# Patient Record
Sex: Female | Born: 1940 | Race: White | Hispanic: No | Smoking: Former smoker
Health system: Southern US, Community
[De-identification: ages and names within clinical notes are randomized; demographics above are authoritative.]

## PROBLEM LIST (undated history)

## (undated) DIAGNOSIS — R519 Headache, unspecified: Secondary | ICD-10-CM

## (undated) DIAGNOSIS — R011 Cardiac murmur, unspecified: Secondary | ICD-10-CM

## (undated) DIAGNOSIS — K219 Gastro-esophageal reflux disease without esophagitis: Secondary | ICD-10-CM

## (undated) DIAGNOSIS — E785 Hyperlipidemia, unspecified: Secondary | ICD-10-CM

## (undated) DIAGNOSIS — I499 Cardiac arrhythmia, unspecified: Secondary | ICD-10-CM

## (undated) DIAGNOSIS — J189 Pneumonia, unspecified organism: Secondary | ICD-10-CM

## (undated) DIAGNOSIS — F319 Bipolar disorder, unspecified: Secondary | ICD-10-CM

## (undated) DIAGNOSIS — C801 Malignant (primary) neoplasm, unspecified: Secondary | ICD-10-CM

## (undated) DIAGNOSIS — M1990 Unspecified osteoarthritis, unspecified site: Secondary | ICD-10-CM

## (undated) DIAGNOSIS — F32A Depression, unspecified: Secondary | ICD-10-CM

## (undated) DIAGNOSIS — F250 Schizoaffective disorder, bipolar type: Secondary | ICD-10-CM

## (undated) DIAGNOSIS — R112 Nausea with vomiting, unspecified: Secondary | ICD-10-CM

## (undated) DIAGNOSIS — E039 Hypothyroidism, unspecified: Secondary | ICD-10-CM

## (undated) DIAGNOSIS — Z9889 Other specified postprocedural states: Secondary | ICD-10-CM

## (undated) DIAGNOSIS — F259 Schizoaffective disorder, unspecified: Secondary | ICD-10-CM

## (undated) DIAGNOSIS — R0600 Dyspnea, unspecified: Secondary | ICD-10-CM

## (undated) DIAGNOSIS — F419 Anxiety disorder, unspecified: Secondary | ICD-10-CM

## (undated) DIAGNOSIS — E079 Disorder of thyroid, unspecified: Secondary | ICD-10-CM

## (undated) DIAGNOSIS — I10 Essential (primary) hypertension: Secondary | ICD-10-CM

## (undated) HISTORY — PX: HAND SURGERY: SHX662

## (undated) HISTORY — PX: BREAST SURGERY: SHX581

## (undated) HISTORY — DX: Disorder of thyroid, unspecified: E07.9

## (undated) HISTORY — DX: Unspecified osteoarthritis, unspecified site: M19.90

## (undated) HISTORY — DX: Cardiac arrhythmia, unspecified: I49.9

## (undated) HISTORY — PX: KNEE SURGERY: SHX244

## (undated) HISTORY — DX: Essential (primary) hypertension: I10

## (undated) HISTORY — DX: Hyperlipidemia, unspecified: E78.5

## (undated) HISTORY — PX: CHOLECYSTECTOMY: SHX55

## (undated) HISTORY — DX: Morbid (severe) obesity due to excess calories: E66.01

## (undated) HISTORY — DX: Schizoaffective disorder, bipolar type: F25.0

## (undated) HISTORY — DX: Bipolar disorder, unspecified: F31.9

## (undated) HISTORY — PX: TONSILLECTOMY: SUR1361

## (undated) HISTORY — DX: Schizoaffective disorder, unspecified: F25.9

## (undated) HISTORY — PX: DILATION AND CURETTAGE OF UTERUS: SHX78

## (undated) HISTORY — DX: Anxiety disorder, unspecified: F41.9

---

## 1998-07-12 ENCOUNTER — Inpatient Hospital Stay (HOSPITAL_COMMUNITY): Admission: AD | Admit: 1998-07-12 | Discharge: 1998-07-21 | Payer: Self-pay | Admitting: *Deleted

## 1998-11-18 ENCOUNTER — Inpatient Hospital Stay (HOSPITAL_COMMUNITY): Admission: AD | Admit: 1998-11-18 | Discharge: 1998-12-01 | Payer: Self-pay | Admitting: *Deleted

## 1998-11-19 ENCOUNTER — Encounter: Payer: Self-pay | Admitting: *Deleted

## 1998-11-20 ENCOUNTER — Encounter: Payer: Self-pay | Admitting: Cardiology

## 1998-11-22 ENCOUNTER — Encounter: Payer: Self-pay | Admitting: Cardiology

## 1998-12-05 ENCOUNTER — Inpatient Hospital Stay (HOSPITAL_COMMUNITY): Admission: EM | Admit: 1998-12-05 | Discharge: 1998-12-15 | Payer: Self-pay | Admitting: *Deleted

## 2001-04-08 ENCOUNTER — Ambulatory Visit (HOSPITAL_COMMUNITY): Admission: RE | Admit: 2001-04-08 | Discharge: 2001-04-08 | Payer: Self-pay | Admitting: Gastroenterology

## 2001-07-08 ENCOUNTER — Inpatient Hospital Stay (HOSPITAL_COMMUNITY): Admission: EM | Admit: 2001-07-08 | Discharge: 2001-07-09 | Payer: Self-pay

## 2003-09-05 ENCOUNTER — Ambulatory Visit (HOSPITAL_COMMUNITY): Admission: RE | Admit: 2003-09-05 | Discharge: 2003-09-05 | Payer: Self-pay | Admitting: Gastroenterology

## 2008-10-11 ENCOUNTER — Encounter: Admission: RE | Admit: 2008-10-11 | Discharge: 2008-10-11 | Payer: Self-pay | Admitting: Cardiology

## 2009-07-05 ENCOUNTER — Encounter: Admission: RE | Admit: 2009-07-05 | Discharge: 2009-07-05 | Payer: Self-pay | Admitting: Cardiology

## 2009-10-15 ENCOUNTER — Encounter: Admission: RE | Admit: 2009-10-15 | Discharge: 2009-10-15 | Payer: Self-pay | Admitting: Surgery

## 2009-10-17 ENCOUNTER — Encounter (INDEPENDENT_AMBULATORY_CARE_PROVIDER_SITE_OTHER): Payer: Self-pay | Admitting: Surgery

## 2009-10-17 ENCOUNTER — Ambulatory Visit (HOSPITAL_BASED_OUTPATIENT_CLINIC_OR_DEPARTMENT_OTHER): Admission: RE | Admit: 2009-10-17 | Discharge: 2009-10-17 | Payer: Self-pay | Admitting: Surgery

## 2009-10-24 ENCOUNTER — Ambulatory Visit: Payer: Self-pay | Admitting: Oncology

## 2009-10-31 LAB — CBC WITH DIFFERENTIAL/PLATELET
BASO%: 0.2 % (ref 0.0–2.0)
Basophils Absolute: 0 10*3/uL (ref 0.0–0.1)
EOS%: 2.4 % (ref 0.0–7.0)
HGB: 13.4 g/dL (ref 11.6–15.9)
MCH: 31.3 pg (ref 25.1–34.0)
MCHC: 33.9 g/dL (ref 31.5–36.0)
MONO#: 0.5 10*3/uL (ref 0.1–0.9)
RDW: 14.2 % (ref 11.2–14.5)
WBC: 6.7 10*3/uL (ref 3.9–10.3)
lymph#: 2.8 10*3/uL (ref 0.9–3.3)

## 2009-10-31 LAB — COMPREHENSIVE METABOLIC PANEL
ALT: 29 U/L (ref 0–35)
AST: 26 U/L (ref 0–37)
Albumin: 4.5 g/dL (ref 3.5–5.2)
CO2: 25 mEq/L (ref 19–32)
Calcium: 10 mg/dL (ref 8.4–10.5)
Chloride: 106 mEq/L (ref 96–112)
Potassium: 4.2 mEq/L (ref 3.5–5.3)

## 2009-10-31 LAB — LACTATE DEHYDROGENASE: LDH: 204 U/L (ref 94–250)

## 2009-11-13 ENCOUNTER — Ambulatory Visit: Admission: RE | Admit: 2009-11-13 | Discharge: 2009-12-07 | Payer: Self-pay | Admitting: Radiation Oncology

## 2009-12-10 ENCOUNTER — Ambulatory Visit: Admission: RE | Admit: 2009-12-10 | Discharge: 2010-01-17 | Payer: Self-pay | Admitting: Radiation Oncology

## 2010-01-13 ENCOUNTER — Ambulatory Visit: Payer: Self-pay | Admitting: Oncology

## 2010-01-14 ENCOUNTER — Other Ambulatory Visit: Payer: Self-pay | Admitting: Oncology

## 2010-01-14 LAB — CBC WITH DIFFERENTIAL/PLATELET
Basophils Absolute: 0 10*3/uL (ref 0.0–0.1)
HCT: 38.1 % (ref 34.8–46.6)
HGB: 13.1 g/dL (ref 11.6–15.9)
MCH: 31.9 pg (ref 25.1–34.0)
MONO#: 0.5 10*3/uL (ref 0.1–0.9)
NEUT%: 54.5 % (ref 38.4–76.8)
WBC: 4.2 10*3/uL (ref 3.9–10.3)
lymph#: 1.2 10*3/uL (ref 0.9–3.3)

## 2010-04-02 ENCOUNTER — Ambulatory Visit: Payer: Self-pay | Admitting: Oncology

## 2010-04-22 LAB — COMPREHENSIVE METABOLIC PANEL
ALT: 32 U/L (ref 0–35)
Alkaline Phosphatase: 48 U/L (ref 39–117)
CO2: 24 mEq/L (ref 19–32)
Creatinine, Ser: 1.01 mg/dL (ref 0.40–1.20)
Total Bilirubin: 0.4 mg/dL (ref 0.3–1.2)

## 2010-04-22 LAB — CBC WITH DIFFERENTIAL/PLATELET
BASO%: 0.4 % (ref 0.0–2.0)
HCT: 37.8 % (ref 34.8–46.6)
LYMPH%: 35.2 % (ref 14.0–49.7)
MCH: 32.1 pg (ref 25.1–34.0)
MCHC: 34.9 g/dL (ref 31.5–36.0)
MCV: 91.8 fL (ref 79.5–101.0)
MONO#: 0.4 10*3/uL (ref 0.1–0.9)
MONO%: 8.7 % (ref 0.0–14.0)
NEUT%: 54.3 % (ref 38.4–76.8)
Platelets: 242 10*3/uL (ref 145–400)
WBC: 4.7 10*3/uL (ref 3.9–10.3)

## 2010-04-22 LAB — VITAMIN D 25 HYDROXY (VIT D DEFICIENCY, FRACTURES): Vit D, 25-Hydroxy: 33 ng/mL (ref 30–89)

## 2010-05-24 ENCOUNTER — Emergency Department (HOSPITAL_COMMUNITY): Admission: EM | Admit: 2010-05-24 | Discharge: 2010-05-24 | Payer: Self-pay | Admitting: Family Medicine

## 2010-06-18 ENCOUNTER — Ambulatory Visit: Payer: Self-pay | Admitting: Oncology

## 2010-07-17 ENCOUNTER — Encounter: Admission: RE | Admit: 2010-07-17 | Discharge: 2010-07-17 | Payer: Self-pay | Admitting: Neurology

## 2010-12-28 ENCOUNTER — Inpatient Hospital Stay (HOSPITAL_COMMUNITY)
Admission: EM | Admit: 2010-12-28 | Discharge: 2010-12-29 | Payer: Self-pay | Source: Home / Self Care | Attending: Internal Medicine | Admitting: Internal Medicine

## 2010-12-29 NOTE — H&P (Addendum)
[name], [name]                 ACCOUNT NO.:  [id number]  MEDICAL RECORD NO.:  [id number]          PATIENT TYPE:  EMS  LOCATION:  MAJO                         FACILITY:  [facility]  PHYSICIAN:  Bevelyn Buckles. Mei Suits, MDDATE OF BIRTH:  10/21/1941  DATE OF ADMISSION:  12/28/2010 DATE OF DISCHARGE:                             HISTORY & PHYSICAL   PRIMARY CARDIOLOGIST:  Cassell Clement, M.D.  REASON FOR ADMISSION:  Chest pain.  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  She has never had a cardiac catheterization.  She did have a stress test in 1999, which was normal.  She has had several admissions over the years for chest pain, which she is ruled out for myocardial infarction with serial cardiac markers.  She tells me that today she was going with a friend to the drug store when she developed pain in her left breast.  This persisted all day and so she talked to one of her friend and he brought her to the emergency room.  In the emergency room, she was treated with two doses of sublingual nitroglycerin which resulted in her chest pain getting worse. As the nitroglycerin wore off, she said she feels better.  She does have minimal residual pain on palpation of the left breast.  She denies any associated nausea, vomiting, diaphoresis, or dyspnea.  Her EKG and first set of cardiac markers are normal.  REVIEW OF SYSTEMS:  She has chronic back and joint pain due to arthritis.  She also has significant problems with anxiety and depression, as well as bipolar disorder.  Remainder review of systems, all systems negative except for HPI and problem list.  PROBLEM LIST: 1. History of recurrent chest pain.     a.     Myoview in 70 was normal. 2. Several admissions for chest pain, all ruled out for myocardial     infarction. 3. Bipolar  disorder with question of schizophrenia. 4. Anxiety. 5. Morbid obesity. 6. Hypertension. 7. Hyperlipidemia. 8. Osteoarthritis. 9. Right bundle branch block.  SOCIAL HISTORY:  She is divorced.  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  She has a remote history of tobacco use, but quit.  Does not drink any alcohol.  FAMILY HISTORY:  Her father is 50 years old and does not having coronary artery disease.  Mother also is alive, but no coronary artery disease.  Has a grandfather who had coronary artery disease.  CURRENT MEDICATIONS: 1. Xanax extended release 1 mg b.i.d. and 0.5 mg up to q.6 h. p.r.n. 2. Namenda 10 mg b.i.d. 3. Lamotrigine 300 mg a day. 4. Aricept 10 mg a day. 5. Metoprolol XL 25 a day. 6. Spironolactone 25 a day. 7. Zyprexa 30 mg at bedtime. 8. Rozerem 8 mg at bedtime for sleep. 9. Multivitamin and multimineral. 10.Fish oil. 11.Calcium. 12.Tylenol. 13.Aspirin 325 a day. 14.Niaspan ER 500 mg a day.  ALLERGIES:  ANTIDEPRESSANTS AND SULFA DRUGS, UNCLEAR EXACTLY WHICH ANTIDEPRESSANTS.  PHYSICAL EXAMINATION:  GENERAL:  She is  sitting up in bed, in no acute distress.  Respirations unlabored. VITAL SIGNS:  Blood pressure is 170/60, heart rate is 80.  She is sating 93% on room air. HEENT:  Normal except for rosacea. NECK:  Supple.  There is no JVD.  Carotids are 2+ bilaterally without any bruits.  There is no lymphadenopathy or thyromegaly. CARDIAC:  PMI is not palpable.  She is regular with soft systolic ejection murmur at the right sternal border. LUNGS:  Clear. ABDOMEN:  Morbidly obese, nontender, and nondistended.  Unable to appreciate any hepatosplenomegaly. EXTREMITIES:  Warm with no cyanosis, clubbing, or edema.  DP pulses are 2+ bilaterally. NEUROLOGIC:  Alert and oriented x3.  Cranial nerves grossly intact. Moves all four extremities without difficulty.  RADIOLOGICAL STUDIES:  EKG shows normal sinus rhythm with a right  bundle branch block, which is chronic.  There are no acute ST-T wave abnormalities.  LABORATORY DATA:  White count 7.2, hemoglobin is 13.7, and platelet count is 227,000.  Sodium 140, potassium 3.5, BUN 15, creatinine 0.91, and glucose 122.  Troponin of less than 0.05 on two sets of markers. INR is 0.97.  Chest x-ray shows cardiomegaly, no acute process.  ASSESSMENT: 1. Recurrent atypical chest pain. 2. Hypertension. 3. Hyperlipidemia. 4. Anxiety/depression. 5. Possible schizophrenia.  PLAN/DISCUSSION:  Her chest pain seems quite atypical.  Given her risk factors, we will keep her overnight for 23-hour observation and rule out myocardial infarction with serial cardiac markers.  If enzymes remain normal, we can discharge her home in the a.m. with plans for outpatient Myoview with Dr. Patty Sermons.     Bevelyn Buckles. Alayjah Boehringer, MD     DRB/MEDQ  D:  12/28/2010  T:  12/28/2010  Job:  [id number]  Electronically Signed by Arvilla Meres MD on 12/29/2010 07:13:41 PM

## 2010-12-31 LAB — CBC
Hemoglobin: 14.2 g/dL (ref 12.0–15.0)
MCH: 30.9 pg (ref 26.0–34.0)
MCHC: 33.6 g/dL (ref 30.0–36.0)
MCV: 92.1 fL (ref 78.0–100.0)
Platelets: 227 10*3/uL (ref 150–400)
Platelets: 228 10*3/uL (ref 150–400)
RBC: 4.6 MIL/uL (ref 3.87–5.11)
WBC: 5.9 10*3/uL (ref 4.0–10.5)

## 2010-12-31 LAB — BASIC METABOLIC PANEL
BUN: 15 mg/dL (ref 6–23)
CO2: 26 mEq/L (ref 19–32)
Calcium: 9.9 mg/dL (ref 8.4–10.5)
Chloride: 103 mEq/L (ref 96–112)
GFR calc Af Amer: >60 mL/min (ref >60)
GFR calc non Af Amer: >60 mL/min (ref >60)
GFR calc non Af Amer: >60 mL/min (ref >60)
Glucose, Bld: 122 mg/dL — ABNORMAL HIGH (ref 70–99)
Potassium: 3.9 mEq/L (ref 3.5–5.1)
Sodium: 140 mEq/L (ref 135–145)
Sodium: 142 mEq/L (ref 135–145)

## 2010-12-31 LAB — DIFFERENTIAL
Basophils Relative: 0 % (ref 0–1)
Eosinophils Absolute: 0.1 10*3/uL (ref 0.0–0.7)
Eosinophils Relative: 2 % (ref 0–5)
Lymphs Abs: 3.3 10*3/uL (ref 0.7–4.0)
Monocytes Absolute: 0.7 10*3/uL (ref 0.1–1.0)
Monocytes Relative: 10 % (ref 3–12)

## 2010-12-31 LAB — POCT CARDIAC MARKERS
CKMB, poc: 2 ng/mL (ref 1.0–8.0)
Myoglobin, poc: 73.1 ng/mL (ref 12–200)
Troponin i, poc: <0.05 ng/mL (ref 0.00–0.09)

## 2010-12-31 LAB — CK TOTAL AND CKMB (NOT AT ARMC)
CK, MB: 2.6 ng/mL (ref 0.3–4.0)
Relative Index: 2.4 (ref 0.0–2.5)

## 2010-12-31 LAB — LIPID PANEL
Cholesterol: 246 mg/dL — ABNORMAL HIGH (ref 0–200)
HDL: 52 mg/dL (ref >39)
LDL Cholesterol: UNDETERMINED mg/dL (ref 0–99)
Total CHOL/HDL Ratio: 4.7 RATIO
Triglycerides: 424 mg/dL — ABNORMAL HIGH (ref <150)

## 2010-12-31 LAB — CARDIAC PANEL(CRET KIN+CKTOT+MB+TROPI)
Relative Index: INVALID (ref 0.0–2.5)
Relative Index: INVALID (ref 0.0–2.5)
Total CK: 94 U/L (ref 7–177)
Total CK: 94 U/L (ref 7–177)

## 2010-12-31 LAB — HEPARIN LEVEL (UNFRACTIONATED): Heparin Unfractionated: <0.1 IU/mL — ABNORMAL LOW (ref 0.30–0.70)

## 2010-12-31 LAB — TROPONIN I: Troponin I: 0.03 ng/mL (ref 0.00–0.06)

## 2010-12-31 LAB — PROTIME-INR: Prothrombin Time: 13.1 seconds (ref 11.6–15.2)

## 2011-01-02 NOTE — Discharge Summary (Addendum)
[name], [name]                 ACCOUNT NO.:  [id number]  MEDICAL RECORD NO.:  [id number]          PATIENT TYPE:  INP  LOCATION:  3729                         FACILITY:  [facility]  PHYSICIAN:  Bevelyn Buckles. Bensimhon, MDDATE OF BIRTH:  [birth year]  DATE OF ADMISSION:  12/28/2010 DATE OF DISCHARGE:  12/29/2010                              DISCHARGE SUMMARY   PRIMARY CARDIOLOGIST:  Cassell Clement, MD  PROCEDURES PERFORMED DURING HOSPITALIZATION:  None.  FINAL DISCHARGE DIAGNOSES: 1. Chest pain.     a.     Noncardiac.     b.     History of recurrent chest pain.     c.     Status post Myoview in 1999 which was normal.     d.     Several admissions for chest pain, all ruled out for      myocardial infarction. 2. Bipolar disorder with question of schizophrenia. 3. Anxiety. 4. Morbid obesity. 5. Hypertension. 6. Hyperlipidemia. 7. Osteoarthritis. 8. Chronic right bundle-branch block.  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  Her friend brought her to the emergency room.  She was treated with two doses of sublingual nitroglycerin which resulted her chest pain becoming worse.  Once the nitroglycerin wore off, she began to feel better.  The patient was seen and examined in the emergency room by Dr. Arvilla Meres.  She was admitted to rule out myocardial infarction with cycling cardiac enzymes.  Cardiac enzymes were cycled and found to be normal.  There were no changes in her EKG indicative of ischemia.  The patient did have cholesterol studies completed revealing elevated cholesterol of 246 with elevated triglycerides of 424.  She was started on fenofibrate 145 mg daily on discharge.  No other medications were changed, and she was found to be stable for discharge on morning rounds by Dr.  Gala Romney.  She will follow up with Dr. Patty Roberts as an outpatient for continued management.  DISCHARGE LABORATORY DATA:  Cholesterol 246, triglycerides 424, HDL 52, and LDL was unable to be calculated.  Sodium 142, potassium 3.9, chloride 103, CO2 of 26, glucose 113, BUN 13, and creatinine 0.84. Hemoglobin 14.2, hematocrit 42.9, white blood cells 5.9, and platelets 228.  Troponin 0.03, 0.04, and 0.04 respectively.    Chest x-ray dated December 28, 2010, revealing cardiomegaly without              acute abnormalities.  EKG revealing sinus rhythm, right bundle-branch block with a rate of 78 beats per minute without acute T wave abnormalities.  VITAL SIGNS ON DISCHARGE:  Blood pressure 130/72, pulse 73, respirations 18, temperature 97.8, and O2 sat 95% on room air.  The patient's weight was 86.6 kg.  DISCHARGE MEDICATIONS: 1. Fenofibrate 145 mg 1 tablet by mouth daily (new prescription     provided). 2. Aricept 10 mg by mouth daily. 3. Aspirin 325 mg at bedtime. 4. Calcium over-the-counter by mouth daily.5. Fish oil over-the-counter  by mouth twice daily. 6. Lamotrigine 100 mg by mouth t.i.d. 7. Metoprolol tartrate 25 mg by mouth daily. 8. Multivitamin over-the-counter by mouth daily. 9. Namenda 10 mg by mouth b.i.d. 10.Niaspan 500 mg by mouth daily at bedtime. 11.Rosiglitazone 8 mg by mouth daily at bedtime. 12.Spirolactone 25 mg daily. 13.Extra Strength Tylenol 1 tablet by mouth 4 times a day. 14.Xanax 0.5 mg 1/2 tablet by mouth p.r.n. 15.Xanax ER 1 mg by mouth b.i.d. 16.Zyprexa 15 mg by mouth b.i.d.  ALLERGIES:  SULFA.  FOLLOWUP PLANS AND APPOINTMENT: 1. The patient will follow up with Dr. Patty Roberts as an outpatient for     continued management.  She may need to be considered for a stress     Myoview at his discretion.  Their office will call to make the     appointment as this is a weekend. 2. The patient has been advised on new medication for     hypertriglyceridemia  with new prescription provided. 3. The patient has been advised to bring all medications to followup     appointment.  Time spent with the patient to include physician time 30 minutes.     Bettey Mare. Lyman Bishop, NP   ______________________________ Bevelyn Buckles. Bensimhon, MD    KML/MEDQ  D:  12/29/2010  T:  12/29/2010  Job:  [id number]  cc:   Cassell Clement, M.D.  Electronically Signed by Joni Reining NP on 12/30/2010 04:45:37 PM Electronically Signed by Arvilla Meres MD on 01/02/2011 04:03:58 PM

## 2011-01-08 ENCOUNTER — Ambulatory Visit: Payer: Self-pay | Admitting: Cardiology

## 2011-01-08 ENCOUNTER — Other Ambulatory Visit: Payer: Medicaid Other

## 2011-01-08 DIAGNOSIS — Z79899 Other long term (current) drug therapy: Secondary | ICD-10-CM

## 2011-01-08 DIAGNOSIS — E039 Hypothyroidism, unspecified: Secondary | ICD-10-CM

## 2011-01-08 DIAGNOSIS — E7800 Pure hypercholesterolemia, unspecified: Secondary | ICD-10-CM

## 2011-02-23 LAB — HERPES SIMPLEX VIRUS CULTURE

## 2011-03-11 ENCOUNTER — Other Ambulatory Visit: Payer: Self-pay | Admitting: *Deleted

## 2011-03-11 DIAGNOSIS — E786 Lipoprotein deficiency: Secondary | ICD-10-CM

## 2011-03-11 MED ORDER — NIACIN ER (ANTIHYPERLIPIDEMIC) 500 MG PO TBCR: EXTENDED_RELEASE_TABLET | ORAL | Status: DC

## 2011-03-11 NOTE — Telephone Encounter (Signed)
Refilled meds per fax request.  

## 2011-03-12 LAB — DIFFERENTIAL
Basophils Relative: 0 % (ref 0–1)
Eosinophils Absolute: 0.1 10*3/uL (ref 0.0–0.7)
Monocytes Relative: 8 % (ref 3–12)
Neutrophils Relative %: 44 % (ref 43–77)

## 2011-03-12 LAB — COMPREHENSIVE METABOLIC PANEL
ALT: 29 U/L (ref 0–35)
Alkaline Phosphatase: 48 U/L (ref 39–117)
CO2: 29 mEq/L (ref 19–32)
GFR calc non Af Amer: >60 mL/min (ref >60)
Glucose, Bld: 113 mg/dL — ABNORMAL HIGH (ref 70–99)
Potassium: 4.1 mEq/L (ref 3.5–5.1)
Sodium: 140 mEq/L (ref 135–145)
Total Protein: 6.4 g/dL (ref 6.0–8.3)

## 2011-03-12 LAB — CBC
Hemoglobin: 13.2 g/dL (ref 12.0–15.0)
RBC: 4.15 MIL/uL (ref 3.87–5.11)

## 2011-03-25 ENCOUNTER — Other Ambulatory Visit: Payer: Self-pay | Admitting: Oncology

## 2011-03-25 ENCOUNTER — Encounter (HOSPITAL_BASED_OUTPATIENT_CLINIC_OR_DEPARTMENT_OTHER): Payer: 59 | Admitting: Oncology

## 2011-03-25 DIAGNOSIS — C50419 Malignant neoplasm of upper-outer quadrant of unspecified female breast: Secondary | ICD-10-CM

## 2011-03-25 DIAGNOSIS — Z170 Estrogen receptor positive status [ER+]: Secondary | ICD-10-CM

## 2011-03-25 LAB — CBC WITH DIFFERENTIAL/PLATELET
BASO%: 0.5 % (ref 0.0–2.0)
EOS%: 3.1 % (ref 0.0–7.0)
HCT: 35.9 % (ref 34.8–46.6)
MCH: 32.1 pg (ref 25.1–34.0)
MCHC: 34.8 g/dL (ref 31.5–36.0)
MONO#: 0.4 10*3/uL (ref 0.1–0.9)
NEUT%: 41.1 % (ref 38.4–76.8)
RDW: 13.6 % (ref 11.2–14.5)
WBC: 3.9 10*3/uL (ref 3.9–10.3)
lymph#: 1.8 10*3/uL (ref 0.9–3.3)

## 2011-03-25 LAB — COMPREHENSIVE METABOLIC PANEL
ALT: 30 U/L (ref 0–35)
AST: 31 U/L (ref 0–37)
Albumin: 4.5 g/dL (ref 3.5–5.2)
CO2: 23 mEq/L (ref 19–32)
Calcium: 9.9 mg/dL (ref 8.4–10.5)
Chloride: 106 mEq/L (ref 96–112)
Creatinine, Ser: 1.11 mg/dL (ref 0.40–1.20)
Potassium: 4 mEq/L (ref 3.5–5.3)
Sodium: 140 mEq/L (ref 135–145)
Total Protein: 6.7 g/dL (ref 6.0–8.3)

## 2011-04-08 ENCOUNTER — Other Ambulatory Visit: Payer: Self-pay | Admitting: *Deleted

## 2011-04-08 DIAGNOSIS — E7800 Pure hypercholesterolemia, unspecified: Secondary | ICD-10-CM

## 2011-04-08 DIAGNOSIS — Z79899 Other long term (current) drug therapy: Secondary | ICD-10-CM

## 2011-04-25 NOTE — Op Note (Signed)
   NAME:  [name], [name]                           ACCOUNT NO.:  [id number]   MEDICAL RECORD NO.:  [id number]                   PATIENT TYPE:  AMB   LOCATION:  ENDO                                 FACILITY:  [facility]   PHYSICIAN:  James L. Malon Kindle., M.D.          DATE OF BIRTH:  [birth year]   DATE OF PROCEDURE:  09/05/2003  DATE OF DISCHARGE:                                 OPERATIVE REPORT   PROCEDURE:  Esophagogastroduodenoscopy with biopsy.   MEDICATIONS:  Cetacaine spray, fentanyl 100 mcg, Versed 10 mg IV.   INDICATIONS FOR PROCEDURE:  Heme positive stool, vague left sided abdominal  pain.   DESCRIPTION OF PROCEDURE:  The procedure had been explained to the patient  and consent obtained. With the patient in the left lateral decubitus  position, the Olympus scope was inserted and advanced. We advanced down to  the stomach, the pyloric channel was identified and passed. The duodenum  including the bulb and the second portion was seen well. There was marked  duodenitis and several erosions. The scope was withdrawn back in the  stomach. There were several gastric erosions and gastritis. A biopsy was  taken for rapid urase test for Helicobacter. The fundus and cardia were seen  well in the retroflexed view and were normal. The scope was withdrawn and  the distal and proximal esophagus were endoscopically normal. The patient  tolerated the procedure well and was maintained on low flow oxygen and pulse  oximeter throughout the procedure.   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   PLAN:  Will give the patient Prilosec over the counter, check the results of  the CLOtest and see back in the office in six weeks.                                               James L. Malon Kindle., M.D.    Waldron Session  D:  09/05/2003  T:  09/05/2003  Job:  [id number]   cc:   Cassell Clement, M.D.  [address], [address]  [place]  [place] [address]  Fax: [phone]

## 2011-04-25 NOTE — Op Note (Signed)
   NAME:  [name], [name]                           ACCOUNT NO.:  [id number]   MEDICAL RECORD NO.:  [id number]                   PATIENT TYPE:  AMB   LOCATION:  ENDO                                 [facility]ACILITY:  [facility]   PHYSICIAN:  James L. Malon Kindle., M.D.          DATE OF BIRTH:  02/11/1941   DATE OF PROCEDURE:  09/05/2003  DATE OF DISCHARGE:                                 OPERATIVE REPORT   PROCEDURE:  Colonoscopy.   ENDOSCOPIST:  Llana Aliment. Edwards, M.D.   MEDICATIONS:  Fentanyl 137.5 mcg, Versed 13 mg IV.   SCOPE:  Pediatric Olympus colonoscope.   INDICATIONS FOR PROCEDURE:  Heme-positive stool.  The patient had an  endoscopy just before this procedure, showing marked duodenitis and  gastritis.   DESCRIPTION OF PROCEDURE:  The procedure had been explained to the patient  and a consent obtained.  With the patient in the left lateral decubitus  position, the Olympus scope was inserted and advanced.  We were able to  advance easily to the cecum.  The ileocecal valve and appendiceal orifice  were seen.  The scope was withdrawn and the cecum, ascending colon, hepatic  flexure, transverse colon, splenic flexure, descending and sigmoid colon  were seen well.  No significant diverticular disease.  No polyps or other  lesions.  The scope was withdrawn.  The patient tolerated the procedure well.   ASSESSMENT:  Heme-positive stool with essentially normal colonoscopy.   PLAN:  Will treat the patient's duodenitis and gastritis, and see back in  the office in six weeks.                                                James L. Malon Kindle., M.D.    Waldron Session  D:  09/05/2003  T:  09/05/2003  Job:  [id number]   cc:   Cassell Clement, M.D.  [address], [address]  [place]  [place] [address]  Fax: [phone]

## 2011-04-25 NOTE — Procedures (Signed)
[place]  Patient:    [name], [name]                        MRN: [id number] Proc. Date: 04/08/01 Adm. Date:  [id number] Attending:  Orland Mustard CC:         Clovis Pu Patty Sermons, M.D.   Procedure Report  PROCEDURE PERFORMED:  Colonoscopy.  ENDOSCOPIST:  Llana Aliment. Randa Evens, M.D.  MEDICATIONS USED:  Fentanyl 100 mcg, Versed 10 mg IV.  INSTRUMENT:  Pediatric video colonoscope.  INDICATIONS:  Rectal bleeding in a 70 year old woman.  DESCRIPTION OF PROCEDURE:  The procedure had been explained to the patient and consent obtained.  With the patient in the left lateral decubitus position, the Olympus pediatric video colonoscope was inserted and advanced under direct visualization.  The prep was excellent and we were able to advance to the cecum without difficulty.  The ileocecal valve and appendiceal orifice were seen and the right lower quadrant transilluminated.  The scope was withdrawn. The cecum, ascending colon, hepatic flexure, transverse colon, splenic flexure, descending and sigmoid colon were seen well upon removal.  No polyps or other lesions seen.  Internal hemorrhoids seen in the rectum.  Scope withdrawn, patient tolerated the procedure well.  ASSESSMENT:  Hematochezia probably due to internal hemorrhoids.  PLAN:  I will give the patient hemorrhoid instructions and see back in the office in two months. DD:  04/08/01 TD:  04/09/01 Job: [id number]

## 2011-04-25 NOTE — H&P (Signed)
[place]  Patient:    [name], [name]                        MRN: [id number] Adm. Date:  [id number] Disc. Date: [id number] Attending:  Orland Mustard CC:         Eliezer Bottom, M.D.   History and Physical  CHIEF COMPLAINT: Chest pain.  HISTORY OF PRESENT ILLNESS:  This is a 70 year old Caucasian female admitted through the emergency room with chest pain.  She has no prior history of known coronary artery disease.  She was hospitalized on the psychiatric unit in 1999 and was noted to have mild cardiomegaly by x-ray at that time. She underwent an Adenosine Cardiolite stress test which was normal on November 22, 1998. She had a 2D echocardiogram on November 20, 1998 showing normal LV function, trace mitral regurgitation and trace tricuspid regurgitation. She has a past history of elevated lipids, particularly triglycerides. She does not have any history of known hypertension or diabetes.  She is hypothyroid.  She went to bed last evening, at about 6 p.m. which is her usual habit. She awoke at 1 a.m. with left sided chest pain. It was not sharp and not particularly dull or pressure like either. The pain did not radiate. There was some nausea but no diaphoresis.  She became concerned, called a friend who drove her to the emergency room, where she was evaluated and admitted.  PAST MEDICAL HISTORY:  Chronic schizophrenia on multiple medications through Dr. Evelene Croon.  FAMILY HISTORY:  Positive for coronary artery disease in a grandfather.  SOCIAL HISTORY:  Reveals that she is divorced.  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  She smoked cigarettes for 14 years but quit more than 20 years ago.  She does not drink any alcohol.  ALLERGIES:  She is allergic to SULFA DRUGS and ANTIDEPRESSANTS.  PRESENT MEDICATIONS:  ProSom 2 mg q.h.s.  Xanax 0.5 mg one t.i.d. and three at history.  Zyprexa 20 mg q.h.s. Tetracycline  500 mg daily for rosacea. Synthroid 0.025 one daily.  Vioxx 25 mg b.i.d. after meals. Depakote-ER 1500 mg q.h.s. Prempro one daily.  REVIEW OF SYSTEMS:  She has had a past history of constipation with occasional hematochezia and had a normal colonoscopy in May 2002 by Dr. Randa Evens. Genitourinary reveals urinary frequency. Endocrine reveals a history of mild hypothyroidism but no diabetes.  Cardiovascular history reveals that she is relatively sedentary and has been mildly overweight.  PHYSICAL EXAMINATION:  VITAL SIGNS:  Blood pressure 140/75, pulse 93, respirations normal.  HEENT:  Normal.  NECK:  Jugular venous pressure normal.  Carotids normal.  LUNGS:  Clear.  BREASTS:  Not examined.  HEART:  Reveals no murmur, gallop, rub, or click.  There is no chest wall tenderness.  ABDOMEN:  Soft without hepatosplenomegaly or masses.  PELVIC/RECTAL: Deferred.  EXTREMITIES: Show good peripheral pulses.  No phlebitis or edema.  Chest x-ray shows normal heart size and clear lungs.  EKG shows normal sinus rhythm with incomplete right bundle branch block and a septal T-wave inversion slightly increased from previous office tracing.  Cardiac enzymes are negative times one.  DIAGNOSTIC IMPRESSION: 1. Chest pain, rule out myocardial infarction, rule out angina pectoris,    rule out gastrointestinal etiology. 2. Schizophrenia. 3. Complicated hypothyroidism. 4. Hyperlipidemia. 5. Osteoarthritis. 6. Rosacea. 7. Post menopausal on Prempro.  DISPOSITION:  Telemetry unit.  Beta-blockers, aspirin, Lovenox.  Hold Prempro for now and she will discuss whether to remain on it with her gynecologist, Dr. Elana Alm.  Will get serial enzymes and EKG.  Consider outpatient treadmill Cardiolite depending on results of initial workup. DD:  07/08/01 TD:  07/08/01 Job: [id number]

## 2011-04-30 ENCOUNTER — Ambulatory Visit (INDEPENDENT_AMBULATORY_CARE_PROVIDER_SITE_OTHER): Payer: 59 | Admitting: Cardiology

## 2011-04-30 ENCOUNTER — Encounter: Payer: Self-pay | Admitting: *Deleted

## 2011-04-30 ENCOUNTER — Other Ambulatory Visit (INDEPENDENT_AMBULATORY_CARE_PROVIDER_SITE_OTHER): Payer: 59 | Admitting: *Deleted

## 2011-04-30 ENCOUNTER — Other Ambulatory Visit (INDEPENDENT_AMBULATORY_CARE_PROVIDER_SITE_OTHER): Payer: 59 | Admitting: Cardiology

## 2011-04-30 ENCOUNTER — Encounter: Payer: Self-pay | Admitting: Cardiology

## 2011-04-30 VITALS — BP 100/70 | HR 64 | Wt 191.0 lb

## 2011-04-30 DIAGNOSIS — E785 Hyperlipidemia, unspecified: Secondary | ICD-10-CM

## 2011-04-30 DIAGNOSIS — R110 Nausea: Secondary | ICD-10-CM

## 2011-04-30 DIAGNOSIS — I119 Hypertensive heart disease without heart failure: Secondary | ICD-10-CM | POA: Insufficient documentation

## 2011-04-30 DIAGNOSIS — Z79899 Other long term (current) drug therapy: Secondary | ICD-10-CM

## 2011-04-30 DIAGNOSIS — I10 Essential (primary) hypertension: Secondary | ICD-10-CM | POA: Insufficient documentation

## 2011-04-30 DIAGNOSIS — E7800 Pure hypercholesterolemia, unspecified: Secondary | ICD-10-CM

## 2011-04-30 DIAGNOSIS — Z13220 Encounter for screening for lipoid disorders: Secondary | ICD-10-CM

## 2011-04-30 DIAGNOSIS — F0390 Unspecified dementia without behavioral disturbance: Secondary | ICD-10-CM | POA: Insufficient documentation

## 2011-04-30 DIAGNOSIS — E039 Hypothyroidism, unspecified: Secondary | ICD-10-CM | POA: Insufficient documentation

## 2011-04-30 LAB — HEPATIC FUNCTION PANEL
ALT: 34 U/L (ref 0–35)
AST: 33 U/L (ref 0–37)
Alkaline Phosphatase: 35 U/L — ABNORMAL LOW (ref 39–117)
Bilirubin, Direct: 0.1 mg/dL (ref 0.0–0.3)
Total Bilirubin: 0.4 mg/dL (ref 0.3–1.2)
Total Protein: 6.8 g/dL (ref 6.0–8.3)

## 2011-04-30 LAB — T4, FREE: Free T4: 0.73 ng/dL (ref 0.60–1.60)

## 2011-04-30 LAB — LIPID PANEL: Total CHOL/HDL Ratio: 3

## 2011-04-30 LAB — BASIC METABOLIC PANEL
CO2: 26 mEq/L (ref 19–32)
Chloride: 107 mEq/L (ref 96–112)
Potassium: 4.3 mEq/L (ref 3.5–5.1)
Sodium: 140 mEq/L (ref 135–145)

## 2011-04-30 LAB — LDL CHOLESTEROL, DIRECT: Direct LDL: 123.3 mg/dL

## 2011-04-30 LAB — TSH: TSH: 2.21 u[IU]/mL (ref 0.35–5.50)

## 2011-04-30 MED ORDER — PROMETHAZINE HCL 25 MG PO TABS: 25.0000 mg | ORAL_TABLET | Freq: Four times a day (QID) | ORAL | Status: DC | PRN

## 2011-04-30 NOTE — Progress Notes (Signed)
[name] Date of Birth:  November 18, 1941 [facility] [address]   [address] [place], [place]  [address] [phone]           Fax   [phone]  HPI: This pleasant 70 year old woman is seen for a scheduled followup office visit.  She has a past history of hypercholesterolemia and exogenous obesity.  She's also had a history of essential hypertension.  She had a admission to [place] for chest pain on 12/28/10 and ruled out for myocardial infarction.  She had a Myoview stress test in 2008 which was normal.  She does have a chronic right bundle-branch block.  She's had a history of anxiety and depression as well as question of schizophrenia.  She had an echocardiogram on 04/24/08 which showed moderate LVH with normal systolic function and with impaired relaxation as well as trace mitral regurgitation and normal pulmonary artery pressure.Recently she was having problems with frequent loose stools but for the past week has been taking an over-the-counter preparation called flora and her bowel habits have normalized.  Current Outpatient Prescriptions  Medication Sig Dispense Refill  . acetaminophen (TYLENOL) 500 MG tablet Take 500 mg by mouth every 6 (six) hours as needed.        . ALPRAZolam (XANAX) 1 MG tablet Take 1 mg by mouth 2 (two) times daily.        Marland Kitchen aspirin 325 MG EC tablet Take 325 mg by mouth daily.        Marland Kitchen CORAL CALCIUM PO Take by mouth.        . fenofibrate (TRICOR) 145 MG tablet Take 145 mg by mouth daily.        . fish oil-omega-3 fatty acids 1000 MG capsule Take 2 g by mouth daily.        Marland Kitchen lamoTRIgine (LAMICTAL) 150 MG tablet Take 300 mg by mouth daily.        Marland Kitchen levothyroxine (SYNTHROID, LEVOTHROID) 150 MCG tablet Take 150 mcg by mouth daily.        . memantine (NAMENDA) 10 MG tablet Take 10 mg by mouth 2 (two) times daily.        . metoprolol succinate (TOPROL-XL) 25 MG 24 hr tablet Take 25 mg by mouth daily.        . Multiple Vitamin  (MULTIVITAMIN) tablet Take 1 tablet by mouth daily.        . niacin (NIASPAN) 500 MG CR tablet Take 1 tablet at bedtime with lowfat snack, 325 mg aspirin 30 minutes prior  30 tablet  11  . OLANZapine (ZYPREXA) 15 MG tablet Take 30 mg by mouth at bedtime.        . ramelteon (ROZEREM) 8 MG tablet Take 8 mg by mouth at bedtime.        Marland Kitchen spironolactone (ALDACTONE) 25 MG tablet Take 25 mg by mouth daily.        Marland Kitchen donepezil (ARICEPT) 10 MG tablet Take 10 mg by mouth at bedtime as needed.        . promethazine (PHENERGAN) 25 MG tablet Take 1 tablet (25 mg total) by mouth every 6 (six) hours as needed for nausea.  30 tablet  0    Allergies  Allergen Reactions  . Macrodantin   . Prozac (Fluoxetine Hcl)   . Sulfa Antibiotics   . Wellbutrin (Bupropion Hcl)   . Zoloft     Patient Active Problem List  Diagnoses  . Hypercholesterolemia  . Hypothyroidism  .  Dementia  . Benign hypertensive heart disease without heart failure    History  Smoking status  . Former Smoker  . Quit date: 12/08/1978  Smokeless tobacco  . Never Used    History  Alcohol Use No    Family History  Problem Relation Age of Onset  . Hypertension Mother     Review of Systems: The patient denies any heat or cold intolerance.  No weight gain or weight loss.  The patient denies headaches or blurry vision.  There is no cough or sputum production.  The patient denies dizziness.  There is no hematuria or hematochezia.  The patient denies any muscle aches or arthritis.  The patient denies any rash.  The patient denies frequent falling or instability.  There is no history of depression or anxiety.  All other systems were reviewed and are negative.   Physical Exam: Filed Vitals:   04/30/11 1014  BP: 100/70  Pulse: 64  The general appearance reveals a well-developed well-nourished woman in no distress.Pupils equal and reactive.   Extraocular Movements are full.  There is no scleral icterus.  The mouth and pharynx are  normal.  The neck is supple.  The carotids reveal no bruits.  The jugular venous pressure is normal.  The thyroid is not enlarged.  There is no lymphadenopathy.The chest is clear to percussion and auscultation. There are no rales or rhonchi. Expansion of the chest is symmetrical.The precordium is quiet.  The first heart sound is normal.  The second heart sound is physiologically split.  There is no murmur gallop rub or click.  There is no abnormal lift or heave.The abdomen is soft and nontender. Bowel sounds are normal. The liver and spleen are not enlarged. There Are no abdominal masses. There are no bruits.  Normal extremity without phlebitis or edema.  Pedal pulses are present.Strength is normal and symmetrical in all extremities.  There is no lateralizing weakness.  There are no sensory deficits.    Assessment / Plan: Continue same medication.  Her request we added Phenergan 25 mg tablet to have on hand for p.r.n. Use for nausea.  Her return in 4 months for followup office visit and fasting lab work including thyroid function lipid panel hepatic function panel and basal metabolic panel

## 2011-04-30 NOTE — Assessment & Plan Note (Signed)
The patient has a past history of dementia as well as problems with anxiety and depression.  She is followed by psychiatry.  Clinically she appears to be doing well from a psychiatric standpoint at this time.

## 2011-04-30 NOTE — Assessment & Plan Note (Signed)
The patient is on Synthroid 150 mcg daily.  She is clinically euthyroid.  We are checking blood work today.

## 2011-04-30 NOTE — Assessment & Plan Note (Signed)
The patient is seen for a scheduled followup office visit. She has a past history of elevated cholesterol.  She is working hard on her diet and she is avoiding desserts.  Her weight is down about 6 pounds since last visit.  She walks regularly at her assisted living facility.

## 2011-05-01 ENCOUNTER — Telehealth: Payer: Self-pay | Admitting: *Deleted

## 2011-05-01 NOTE — Telephone Encounter (Signed)
Adv pt . Of lab results 

## 2011-06-03 ENCOUNTER — Other Ambulatory Visit: Payer: Self-pay | Admitting: *Deleted

## 2011-06-03 DIAGNOSIS — I10 Essential (primary) hypertension: Secondary | ICD-10-CM

## 2011-06-03 MED ORDER — METOPROLOL TARTRATE 25 MG PO TABS: 25.0000 mg | ORAL_TABLET | Freq: Every day | ORAL | Status: DC

## 2011-06-03 NOTE — Telephone Encounter (Signed)
Refilled meds per fax request.  

## 2011-06-09 ENCOUNTER — Telehealth: Payer: Self-pay | Admitting: Cardiology

## 2011-06-09 DIAGNOSIS — K219 Gastro-esophageal reflux disease without esophagitis: Secondary | ICD-10-CM

## 2011-06-09 MED ORDER — OMEPRAZOLE 20 MG PO CPDR: DELAYED_RELEASE_CAPSULE | ORAL | Status: DC

## 2011-06-09 NOTE — Telephone Encounter (Signed)
Received call from patient.  She will be back home at 330.  Please call between 330 and 500pm.  Patient is having symptoms of acid reflux.  Would like to know if she can take prilosec.

## 2011-06-09 NOTE — Telephone Encounter (Signed)
Agree with plan as outlined.

## 2011-06-09 NOTE — Telephone Encounter (Signed)
Patient phoned c/o a feeling of choking and coughing spells sometimes when she eats.  States on one occasion she did have food come back up in her mouth.  Does have some nausea after eating as well.  Used to take Prilosec daily for stomach ulcer, which she states is probably has healed.  Will try Prilosec twice daily for 1 week and then daily.  If this doesn't help or worse call back per Dr. Patty Sermons

## 2011-07-25 ENCOUNTER — Other Ambulatory Visit: Payer: Self-pay | Admitting: *Deleted

## 2011-07-25 NOTE — Telephone Encounter (Signed)
Patient phoned stating that she has been having pain in her lower left side radiating around the back.  She thinks it is just a pulled muscle.  Advised ok to use ice as needed.  Has not tried any Tylenol or anything else.  Been going on for  3 days.  Discussed with  Dr. Patty Sermons and will just continue ice and use Tylenol as needed.  No answer when called back, will try again later.

## 2011-08-20 ENCOUNTER — Other Ambulatory Visit (INDEPENDENT_AMBULATORY_CARE_PROVIDER_SITE_OTHER): Payer: 59 | Admitting: *Deleted

## 2011-08-20 ENCOUNTER — Encounter: Payer: Self-pay | Admitting: Cardiology

## 2011-08-20 ENCOUNTER — Ambulatory Visit (INDEPENDENT_AMBULATORY_CARE_PROVIDER_SITE_OTHER): Payer: 59 | Admitting: Cardiology

## 2011-08-20 VITALS — BP 120/80 | HR 80 | Wt 188.0 lb

## 2011-08-20 DIAGNOSIS — E785 Hyperlipidemia, unspecified: Secondary | ICD-10-CM

## 2011-08-20 DIAGNOSIS — F0390 Unspecified dementia without behavioral disturbance: Secondary | ICD-10-CM

## 2011-08-20 DIAGNOSIS — I119 Hypertensive heart disease without heart failure: Secondary | ICD-10-CM

## 2011-08-20 DIAGNOSIS — E039 Hypothyroidism, unspecified: Secondary | ICD-10-CM

## 2011-08-20 DIAGNOSIS — Z79899 Other long term (current) drug therapy: Secondary | ICD-10-CM

## 2011-08-20 DIAGNOSIS — E7800 Pure hypercholesterolemia, unspecified: Secondary | ICD-10-CM

## 2011-08-20 LAB — LIPID PANEL
Cholesterol: 190 mg/dL (ref 0–200)
LDL Cholesterol: 104 mg/dL — ABNORMAL HIGH (ref 0–99)
VLDL: 37.2 mg/dL (ref 0.0–40.0)

## 2011-08-20 LAB — T4, FREE: Free T4: 0.84 ng/dL (ref 0.60–1.60)

## 2011-08-20 LAB — BASIC METABOLIC PANEL
CO2: 26 mEq/L (ref 19–32)
Calcium: 9.9 mg/dL (ref 8.4–10.5)
GFR: 63.89 mL/min (ref >60.00)
Sodium: 142 mEq/L (ref 135–145)

## 2011-08-20 LAB — HEPATIC FUNCTION PANEL
AST: 31 U/L (ref 0–37)
Albumin: 4.4 g/dL (ref 3.5–5.2)

## 2011-08-20 NOTE — Assessment & Plan Note (Signed)
The patient feels that her dementia has not worsened significantly and she is on Aricept and Namenda.

## 2011-08-20 NOTE — Progress Notes (Signed)
[name] Date of Birth:  1941/01/27 [facility] [address]   [address] [place], [place]  [address] [phone]           Fax   [phone]  HPI: This pleasant 70 year old woman is seen for a scheduled followup office visit.  She has a past history of essential hypertension and dyslipidemia.  She had an admission to [facility] in 12/28/10 and ruled out for myocardial infarction.  She had a Myoview stress test in 2008 which was normal.  She does have a chronic right bundle-branch block.  She had an echocardiogram on 04/24/08 which showed moderate LVH with normal systolic function with impaired relaxation and normal pulmonary artery pressure.  She has a complex psychiatric diagnosis and is followed by Dr. Evelene Croon.  She has 4 psychiatric diagnoses which are OCD, bipolar syndrome, borderline schizophrenia, and early Alzheimer's disease.  Current Outpatient Prescriptions  Medication Sig Dispense Refill  . acetaminophen (TYLENOL) 500 MG tablet Take 500 mg by mouth every 6 (six) hours as needed.        . ALPRAZolam (XANAX) 1 MG tablet Take 1 mg by mouth 2 (two) times daily. Also taking .5mg  prn      . aspirin 325 MG EC tablet Take 325 mg by mouth daily.        Marland Kitchen CORAL CALCIUM PO Take by mouth.        . donepezil (ARICEPT) 10 MG tablet Take 10 mg by mouth at bedtime as needed.        . fenofibrate (TRICOR) 145 MG tablet Take 145 mg by mouth daily.        . fish oil-omega-3 fatty acids 1000 MG capsule Take 2 g by mouth daily.        Marland Kitchen lamoTRIgine (LAMICTAL) 150 MG tablet Take 300 mg by mouth daily.        Marland Kitchen levothyroxine (SYNTHROID, LEVOTHROID) 150 MCG tablet Take 150 mcg by mouth daily.        . memantine (NAMENDA) 10 MG tablet Take 10 mg by mouth 2 (two) times daily.        . metoprolol tartrate (LOPRESSOR) 25 MG tablet Take 1 tablet (25 mg total) by mouth daily.  30 tablet  11  . Multiple Vitamin (MULTIVITAMIN) tablet Take 1 tablet by mouth daily.        . niacin  (NIASPAN) 500 MG CR tablet Take 1 tablet at bedtime with lowfat snack, 325 mg aspirin 30 minutes prior  30 tablet  11  . OLANZapine (ZYPREXA) 15 MG tablet Take 30 mg by mouth at bedtime.        Marland Kitchen omeprazole (PRILOSEC) 20 MG capsule Twice a day for 1 week and then daily  40 capsule  11  . ramelteon (ROZEREM) 8 MG tablet Take 8 mg by mouth at bedtime.        Marland Kitchen spironolactone (ALDACTONE) 25 MG tablet Take 25 mg by mouth daily.          Allergies  Allergen Reactions  . Macrodantin   . Prozac (Fluoxetine Hcl)   . Sulfa Antibiotics   . Wellbutrin (Bupropion Hcl)   . Zoloft     Patient Active Problem List  Diagnoses  . Hypercholesterolemia  . Hypothyroidism  . Dementia  . Benign hypertensive heart disease without heart failure    History  Smoking status  . Former Smoker  . Quit date: 12/08/1978  Smokeless tobacco  . Never Used  History  Alcohol Use No    Family History  Problem Relation Age of Onset  . Hypertension Mother     Review of Systems: The patient denies any heat or cold intolerance.  No weight gain or weight loss.  The patient denies headaches or blurry vision.  There is no cough or sputum production.  The patient denies dizziness.  There is no hematuria or hematochezia.  The patient denies any muscle aches or arthritis.  The patient denies any rash.  The patient denies frequent falling or instability.  There is no history of depression or anxiety.  All other systems were reviewed and are negative.   Physical Exam: Filed Vitals:   08/20/11 0917  BP: 120/80  Pulse: 80   The general Appearance reveals a well-developed slightly obese woman in no acute distress.The head and neck exam reveals pupils equal and reactive.  Extraocular movements are full.  There is no scleral icterus.  The mouth and pharynx are normal.  The neck is supple.  The carotids reveal no bruits.  The jugular venous pressure is normal.  The  thyroid is not enlarged.  There is no lymphadenopathy.   The chest is clear to percussion and auscultation.  There are no rales or rhonchi.  Expansion of the chest is symmetrical.  The precordium is quiet.  The first heart sound is normal.  The second heart sound is physiologically split.  There is no murmur gallop rub or click.  There is no abnormal lift or heave.  The abdomen is soft and nontender.  The bowel sounds are normal.  The liver and spleen are not enlarged.  There are no abdominal masses.  There are no abdominal bruits.  Extremities reveal good pedal pulses.  There is no phlebitis or edema.  There is no cyanosis or clubbing.  Strength is normal and symmetrical in all extremities.  There is no lateralizing weakness.  There are no sensory deficits.  The skin is warm and dry.  There is no rash.     Assessment / Plan: Continue same medication.  Recheck in 4 months for followup office visit and lab work Including TSH

## 2011-08-20 NOTE — Assessment & Plan Note (Signed)
Patient has a history of hypercholesterolemia.  We reviewed her recent labs which are satisfactory.  The patient has been exercising on a daily basis at the gymnasium at [facility] where she lives.  She also takes physical therapy twice a week to help with balance.

## 2011-08-20 NOTE — Assessment & Plan Note (Signed)
The patient has not been experiencing any headaches or dizziness or other symptoms from her blood pressure

## 2011-08-21 NOTE — Progress Notes (Signed)
No answer

## 2011-09-02 ENCOUNTER — Telehealth: Payer: Self-pay | Admitting: *Deleted

## 2011-09-02 NOTE — Progress Notes (Signed)
Unable to reach via phone, mailed copy of labs 

## 2011-09-02 NOTE — Progress Notes (Signed)
No answer, mailed copy of labs

## 2011-09-02 NOTE — Telephone Encounter (Signed)
Unable to reach via phone, mailed copy of labs 

## 2011-09-02 NOTE — Telephone Encounter (Signed)
Message copied by Burnell Blanks on Tue Sep 02, 2011 10:21 AM ------      Message from: Cassell Clement      Created: Wed Aug 20, 2011  9:18 PM       Please report.  Her cholesterol was satisfactory 190  And the triglycerides are improved at 186.The thyroid function is normal with a TSH of 1.14.  The liver tests are all normal.The kidney tests are normal and the blood sugar is normal at 96.  Stay on same medicine.

## 2011-09-11 ENCOUNTER — Other Ambulatory Visit: Payer: Self-pay | Admitting: Cardiology

## 2011-09-11 DIAGNOSIS — F419 Anxiety disorder, unspecified: Secondary | ICD-10-CM

## 2011-09-12 MED ORDER — SPIRONOLACTONE 25 MG PO TABS: 25.0000 mg | ORAL_TABLET | Freq: Every day | ORAL | Status: DC

## 2011-09-12 MED ORDER — ALPRAZOLAM 1 MG PO TABS: 1.0000 mg | ORAL_TABLET | Freq: Two times a day (BID) | ORAL | Status: DC

## 2011-09-12 NOTE — Telephone Encounter (Signed)
Pt really needs her Rx filled today she has called and been to Pharm several times and she really needs her meds

## 2011-09-15 ENCOUNTER — Other Ambulatory Visit: Payer: Self-pay | Admitting: *Deleted

## 2011-10-13 ENCOUNTER — Telehealth: Payer: Self-pay | Admitting: Cardiology

## 2011-10-13 DIAGNOSIS — J069 Acute upper respiratory infection, unspecified: Secondary | ICD-10-CM

## 2011-10-13 MED ORDER — AZITHROMYCIN 250 MG PO TABS: ORAL_TABLET | ORAL | Status: AC

## 2011-10-13 NOTE — Telephone Encounter (Signed)
Ok to call in

## 2011-10-13 NOTE — Telephone Encounter (Signed)
No answer

## 2011-10-13 NOTE — Telephone Encounter (Signed)
Okay to send him a Z-Pak and have her also use plain Mucinex twice a day

## 2011-10-13 NOTE — Telephone Encounter (Signed)
New message Pt said she has been sick for 10 days. She has cold/virus. She has bad cough. She thinks she needs a zpac please call her back

## 2011-10-13 NOTE — Telephone Encounter (Signed)
Pt rtn your call

## 2011-10-13 NOTE — Telephone Encounter (Signed)
No answer, will send [facility] to [facility] and try again later

## 2011-10-14 NOTE — Telephone Encounter (Signed)
No answer

## 2011-10-14 NOTE — Telephone Encounter (Signed)
Advised patient, she actually picked up Rx yesterday

## 2011-10-15 ENCOUNTER — Telehealth: Payer: Self-pay | Admitting: Cardiology

## 2011-10-15 NOTE — Telephone Encounter (Signed)
Still coughing up and blowing out a lot of colored mucus.  No energy at all.  Denies fever or wheezing.  Has had this for 12 days, but this day 3 of antibiotic.  Explained that antibiotic she takes for 5 days, but does last for about 5 days after she finishes.  Concerned she needs to be seen.  Please advise

## 2011-10-15 NOTE — Telephone Encounter (Signed)
Advised patient

## 2011-10-15 NOTE — Telephone Encounter (Signed)
Sure she is drinking plenty of water with the Mucinex and to give the Zithromax a little more time.  Consider office visit on Friday if no better.

## 2011-10-15 NOTE — Telephone Encounter (Signed)
Pt called. She said she talked to you about being sick and she is not getting any better. She is taking zpac.

## 2011-11-25 ENCOUNTER — Other Ambulatory Visit: Payer: Self-pay | Admitting: Internal Medicine

## 2011-11-28 ENCOUNTER — Other Ambulatory Visit: Payer: Self-pay | Admitting: *Deleted

## 2011-11-28 MED ORDER — FENOFIBRATE 145 MG PO TABS: 145.0000 mg | ORAL_TABLET | Freq: Every day | ORAL | Status: DC

## 2011-11-28 NOTE — Telephone Encounter (Signed)
Refilled tricor

## 2011-12-23 ENCOUNTER — Telehealth: Payer: Self-pay | Admitting: Cardiology

## 2011-12-23 NOTE — Telephone Encounter (Signed)
Follow up from previous call:  Be in apartment  until 2:15 pm.  Returning 3:30 to 5:00 pm.

## 2011-12-23 NOTE — Telephone Encounter (Signed)
Started out around bottom of neck.  Denies any blisters.  No medication changes, five days after hand surgery took Keflex finished 1/3.  Yesterday morning woke up and when she got out of bed left sided back pain above waist down hip.  Took tylenol and ice no help.  Today it is worse.  Rash not on back, only around neck. Discussed with  Dr. Patty Sermons and he will see patient tomorrow

## 2011-12-23 NOTE — Telephone Encounter (Signed)
New msg Pt said she has rash from neck going down to chest. She said she is having lower back pain and left side pain. No chest pain or sob. Please call back after 1230 to 230 or 315 to 5.

## 2011-12-24 ENCOUNTER — Encounter: Payer: Self-pay | Admitting: Cardiology

## 2011-12-24 ENCOUNTER — Ambulatory Visit (INDEPENDENT_AMBULATORY_CARE_PROVIDER_SITE_OTHER): Payer: 59 | Admitting: Cardiology

## 2011-12-24 VITALS — BP 120/70 | HR 78 | Ht 62.0 in | Wt 190.0 lb

## 2011-12-24 DIAGNOSIS — L309 Dermatitis, unspecified: Secondary | ICD-10-CM | POA: Insufficient documentation

## 2011-12-24 DIAGNOSIS — L259 Unspecified contact dermatitis, unspecified cause: Secondary | ICD-10-CM

## 2011-12-24 DIAGNOSIS — I119 Hypertensive heart disease without heart failure: Secondary | ICD-10-CM

## 2011-12-24 MED ORDER — HYDROCORTISONE 1 % EX CREA: TOPICAL_CREAM | Freq: Two times a day (BID) | CUTANEOUS | Status: DC

## 2011-12-24 NOTE — Patient Instructions (Signed)
Sent Rx for Hydrocortisone cream apply twice daily' Get OTC Benedryl 25 mg 1 every 6 hours as needed

## 2011-12-24 NOTE — Assessment & Plan Note (Signed)
The patient has not been in any chest pain or shortness of breath or recent cardiovascular symptoms.

## 2011-12-24 NOTE — Assessment & Plan Note (Signed)
The patient has a maculopapular eruption in the distribution of a necklace on her upper chest.  She denies any change in perfume, any new jewelry, any change in detergents etc. to account for this.  She does not have the rash on any other part of her body.  The eruption is pruritic and she has to resist the sensation to scratch it.

## 2011-12-24 NOTE — Progress Notes (Signed)
[name] Date of Birth:  November 20, 1941 [facility] [address] [place], [place]  [address] [phone]  Fax   [phone]  HPI: This pleasant 71 year old woman is seen as a work in the office visit.  She has a history of essential hypertension and dyslipidemia.  She had a admission in January 2012 for chest pain and ruled out for myocardial infarction.  She had a normal Myoview stress test in 2008.  He has not had any recent chest pain.  She did have an echocardiogram in 2009 showing moderate LVH with normal systolic function and with impaired relaxation.  She has a complex psychiatric diagnoses which includes OCD, bipolar syndrome, borderline schizophrenia, and early Alzheimer's disease.  She comes in today because of a rash of 2 weeks duration on her upper chest.  The rash began after she had been on cephalexin for a few days following hand surgery.  He is not previously known to be allergic to cephalexin.  He is allergic to Prozac Wellbutrin Zoloft Macrodantin and sulfa.  In the past she has tolerated amoxicillin without difficulty.  She has been applying hand sanitizer to the rash several times a day for the past week with no improvement.  Patient has also had a two-day history of low back pain on the left side.  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  Current Outpatient Prescriptions  Medication Sig Dispense Refill  . acetaminophen (TYLENOL) 500 MG tablet Take 500 mg by mouth every 6 (six) hours as needed.        . ALPRAZolam (XANAX) 1 MG tablet Take 1 tablet (1 mg total) by mouth 2 (two) times daily. Also taking .5mg  prn  60 tablet  5  . aspirin 325 MG EC tablet Take 325 mg by mouth daily.        Marland Kitchen CORAL CALCIUM PO Take by mouth.        . donepezil (ARICEPT) 10 MG tablet Take 10 mg by mouth at bedtime as needed.        . fenofibrate (TRICOR) 145 MG tablet Take 1 tablet (145 mg total) by mouth daily.  30 tablet  10  . fish oil-omega-3  fatty acids 1000 MG capsule Take 2 g by mouth daily.        . hydrocortisone cream 1 % Apply topically 2 (two) times daily.  30 g  2  . lamoTRIgine (LAMICTAL) 150 MG tablet Take 300 mg by mouth daily.        Marland Kitchen levothyroxine (SYNTHROID, LEVOTHROID) 150 MCG tablet Take 150 mcg by mouth daily.        . memantine (NAMENDA) 10 MG tablet Take 10 mg by mouth 2 (two) times daily.        . metoprolol tartrate (LOPRESSOR) 25 MG tablet Take 1 tablet (25 mg total) by mouth daily.  30 tablet  11  . Multiple Vitamin (MULTIVITAMIN) tablet Take 1 tablet by mouth daily.        . niacin (NIASPAN) 500 MG CR tablet Take 1 tablet at bedtime with lowfat snack, 325 mg aspirin 30 minutes prior  30 tablet  11  . OLANZapine (ZYPREXA) 15 MG tablet Take 30 mg by mouth at bedtime.        Marland Kitchen omeprazole (PRILOSEC) 20 MG capsule Twice a day for 1 week and then daily  40 capsule  11  . ramelteon (ROZEREM) 8 MG tablet Take 8 mg by mouth at bedtime.        Marland Kitchen  spironolactone (ALDACTONE) 25 MG tablet Take 1 tablet (25 mg total) by mouth daily.  30 tablet  6    Allergies  Allergen Reactions  . Macrodantin   . Prozac (Fluoxetine Hcl)   . Sulfa Antibiotics   . Wellbutrin (Bupropion Hcl)   . Zoloft     Patient Active Problem List  Diagnoses  . Hypercholesterolemia  . Hypothyroidism  . Dementia  . Benign hypertensive heart disease without heart failure    History  Smoking status  . Former Smoker  . Quit date: 12/08/1978  Smokeless tobacco  . Never Used    History  Alcohol Use No    Family History  Problem Relation Age of Onset  . Hypertension Mother     Review of Systems: The patient denies any heat or cold intolerance.  No weight gain or weight loss.  The patient denies headaches or blurry vision.  There is no cough or sputum production.  The patient denies dizziness.  There is no hematuria or hematochezia.  The patient denies any muscle aches or arthritis.  The patient denies any rash.  The patient denies  frequent falling or instability.  There is no history of depression or anxiety.  All other systems were reviewed and are negative.   Physical Exam: Filed Vitals:   12/24/11 1333  BP: 120/70  Pulse: 78   the general appearance reveals a well-developed well-nourished woman in no distress.  She has a skin rash scattered macular macular papular lesions on the upper chest in the distribution of a necklace.  The rash is bilateral.  It does not appear to be shingles.The chest is clear to percussion and auscultation. There are no rales or rhonchi. Expansion of the chest is symmetrical.  The precordium is quiet.  The first heart sound is normal.  The second heart sound is physiologically split.  There is no murmur gallop rub or click.  There is no abnormal lift or heave.  The abdomen is soft and nontender. Bowel sounds are normal. The liver and spleen are not enlarged. There Are no abdominal masses. There are no bruits.  Extremities no phlebitis or edema.  Normal neurologic.    Assessment / Plan: For the rash we are prescribing hydrocortisone cream 1% to be applied twice a day.  She may also take some over-the-counter 25 mg Benadryl one every 6 hours when necessary for severe itching.  Continue other medicines the same and rechecked at her regular visit

## 2012-01-07 ENCOUNTER — Encounter: Payer: Self-pay | Admitting: Cardiology

## 2012-01-07 ENCOUNTER — Other Ambulatory Visit (INDEPENDENT_AMBULATORY_CARE_PROVIDER_SITE_OTHER): Payer: 59 | Admitting: *Deleted

## 2012-01-07 ENCOUNTER — Ambulatory Visit (INDEPENDENT_AMBULATORY_CARE_PROVIDER_SITE_OTHER): Payer: 59 | Admitting: Cardiology

## 2012-01-07 VITALS — BP 138/80 | HR 60 | Ht 62.0 in | Wt 190.0 lb

## 2012-01-07 DIAGNOSIS — Z79899 Other long term (current) drug therapy: Secondary | ICD-10-CM

## 2012-01-07 DIAGNOSIS — L259 Unspecified contact dermatitis, unspecified cause: Secondary | ICD-10-CM

## 2012-01-07 DIAGNOSIS — E039 Hypothyroidism, unspecified: Secondary | ICD-10-CM

## 2012-01-07 DIAGNOSIS — E7800 Pure hypercholesterolemia, unspecified: Secondary | ICD-10-CM

## 2012-01-07 DIAGNOSIS — F0390 Unspecified dementia without behavioral disturbance: Secondary | ICD-10-CM

## 2012-01-07 DIAGNOSIS — L309 Dermatitis, unspecified: Secondary | ICD-10-CM

## 2012-01-07 DIAGNOSIS — I119 Hypertensive heart disease without heart failure: Secondary | ICD-10-CM

## 2012-01-07 DIAGNOSIS — R21 Rash and other nonspecific skin eruption: Secondary | ICD-10-CM

## 2012-01-07 LAB — BASIC METABOLIC PANEL
BUN: 17 mg/dL (ref 6–23)
CO2: 27 mEq/L (ref 19–32)
Calcium: 9.9 mg/dL (ref 8.4–10.5)
Creatinine, Ser: 1 mg/dL (ref 0.4–1.2)

## 2012-01-07 LAB — LIPID PANEL
Cholesterol: 192 mg/dL (ref 0–200)
HDL: 45.8 mg/dL (ref >39.00)
Total CHOL/HDL Ratio: 4
Triglycerides: 156 mg/dL — ABNORMAL HIGH (ref 0.0–149.0)

## 2012-01-07 LAB — HEPATIC FUNCTION PANEL
Bilirubin, Direct: 0.1 mg/dL (ref 0.0–0.3)
Total Bilirubin: 0.4 mg/dL (ref 0.3–1.2)
Total Protein: 7 g/dL (ref 6.0–8.3)

## 2012-01-07 LAB — TSH: TSH: 1.69 u[IU]/mL (ref 0.35–5.50)

## 2012-01-07 MED ORDER — NYSTATIN-TRIAMCINOLONE 100000-0.1 UNIT/GM-% EX CREA: TOPICAL_CREAM | Freq: Three times a day (TID) | CUTANEOUS | Status: DC | PRN

## 2012-01-07 NOTE — Assessment & Plan Note (Signed)
The patient now appears to have a possible yeast dermatitis under her breasts and we will use Mycolog cream as necessary to control symptoms.

## 2012-01-07 NOTE — Assessment & Plan Note (Addendum)
We are checking fasting lipids today.  She is on TriCor and niacin.

## 2012-01-07 NOTE — Assessment & Plan Note (Signed)
Her symptoms of dementia and bipolar disease and borderline schizophrenia and OCD are stable under the care of her psychiatrist.

## 2012-01-07 NOTE — Patient Instructions (Addendum)
Have sent Rx for Mycolog cream to apply to rash three times a day as needed [facility] Will obtain labs today and call you with the results(lp/bmet/hfp/tsh/t70f) Your physician recommends that you schedule a follow-up appointment in: 4 months with fasting labs (lp/bmet/hfp/t34f/tsh)

## 2012-01-07 NOTE — Progress Notes (Signed)
[name] Date of Birth:  05-11-1941 [facility] [address] [place], [place]  [address] [phone]         Fax   [phone]  History of Present Illness: This pleasant 71 year old woman is seen for a scheduled four-month followup office visit.  She has a history of essential hypertension and dyslipidemia.  Been having any recent cardiac symptoms.  She has had several other things which have been concerning her.  She has reported some back pain but that has improved.  She was concerned that she might have a lump over her left hip area but examination reveals no mass or lump.  She has had some occasional bright red brief rectal bleeding but only after large difficult to pass hard bowel movements.  She has had a previous colonoscopy.  She does not want another one.  We will put her on stool softeners and encourage her to drink plenty of water.  If she continues to have rectal bleeding we will pursue the workup further.  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  She reports that the rash that we saw her for 2 weeks ago improved on hydrocortisone cream but has now recurred under her breasts.  She has a history of hypothyroidism and is clinically euthyroid.  Current Outpatient Prescriptions  Medication Sig Dispense Refill  . acetaminophen (TYLENOL) 500 MG tablet Take 500 mg by mouth every 6 (six) hours as needed.        . ALPRAZolam (XANAX) 1 MG tablet Take 1 tablet (1 mg total) by mouth 2 (two) times daily. Also taking .5mg  prn  60 tablet  5  . aspirin 325 MG EC tablet Take 325 mg by mouth daily.        Marland Kitchen CORAL CALCIUM PO Take by mouth.        . diphenhydrAMINE (BENADRYL) 25 MG tablet Take 25 mg by mouth  every 6 (six) hours as needed. As directed      . donepezil (ARICEPT) 10 MG tablet Take 10 mg by mouth at bedtime as needed.        . fenofibrate (TRICOR) 145 MG tablet Take 1 tablet (145 mg total) by mouth daily.  30 tablet  10  . fish oil-omega-3 fatty acids 1000 MG capsule Take 2 g by mouth daily.        . hydrocortisone cream 1 % Apply topically 2 (two) times daily.  30 g  2  . lamoTRIgine (LAMICTAL) 150 MG tablet Take 300 mg by mouth daily.        Marland Kitchen levothyroxine (SYNTHROID, LEVOTHROID) 150 MCG tablet Take 150 mcg by mouth daily.        . memantine (NAMENDA) 10 MG tablet Take 10 mg by mouth 2 (two) times daily.        . metoprolol tartrate (LOPRESSOR) 25 MG tablet Take 1 tablet (25 mg total) by mouth daily.  30 tablet  11  . Multiple Vitamin (MULTIVITAMIN) tablet Take 1 tablet by mouth daily.        . niacin (NIASPAN) 500 MG CR tablet Take 1 tablet at bedtime with lowfat snack, 325 mg aspirin 30 minutes prior  30 tablet  11  .  OLANZapine (ZYPREXA) 15 MG tablet Take 30 mg by mouth at bedtime.        Marland Kitchen omeprazole (PRILOSEC) 20 MG capsule Twice a day for 1 week and then daily  40 capsule  11  . ramelteon (ROZEREM) 8 MG tablet Take 8 mg by mouth at bedtime.        Marland Kitchen spironolactone (ALDACTONE) 25 MG tablet Take 1 tablet (25 mg total) by mouth daily.  30 tablet  6  . nystatin-triamcinolone (MYCOLOG II) cream Apply topically 3 (three) times daily as needed.  30 g  1    Allergies  Allergen Reactions  . Macrodantin   . Prozac (Fluoxetine Hcl)   . Sulfa Antibiotics   . Wellbutrin (Bupropion Hcl)   . Zoloft     Patient Active Problem List  Diagnoses  . Hypercholesterolemia  . Hypothyroidism  . Dementia  . Benign hypertensive heart disease without heart failure  . Dermatitis    History  Smoking status  . Former Smoker  . Quit date: 12/08/1978  Smokeless tobacco  . Never Used    History  Alcohol Use No    Family History  Problem Relation Age of Onset  . Hypertension Mother       Review of Systems: Constitutional: no fever chills diaphoresis or fatigue or change in weight.  Head and neck: no hearing loss, no epistaxis, no photophobia or visual disturbance. Respiratory: No cough, shortness of breath or wheezing. Cardiovascular: No chest pain peripheral edema, palpitations. Gastrointestinal: No abdominal distention, no abdominal pain, no change in bowel habits hematochezia or melena. Genitourinary: No dysuria, no frequency, no urgency, no nocturia. Musculoskeletal:No arthralgias, no back pain, no gait disturbance or myalgias. Neurological: No dizziness, no headaches, no numbness, no seizures, no syncope, no weakness, no tremors. Hematologic: No lymphadenopathy, no easy bruising. Psychiatric: No confusion, no hallucinations, no sleep disturbance.    Physical Exam: Filed Vitals:   01/07/12 0858  BP: 138/80  Pulse: 60   the general appearance reveals a well-developed well-nourished woman in no distress.  Pupils equal and reactive.   Extraocular Movements are full.  There is no scleral icterus.  The mouth and pharynx are normal.  The neck is supple.  The carotids reveal no bruits.  The jugular venous pressure is normal.  The thyroid is not enlarged.  There is no lymphadenopathy.  The chest is clear to percussion and auscultation. There are no rales or rhonchi. Expansion of the chest is symmetrical.  The precordium is quiet.  The first heart sound is normal.  The second heart sound is physiologically split.  There is no murmur gallop rub or click.  There is no abnormal lift or heave.  The abdomen is soft and nontender. Bowel sounds are normal. The liver and spleen are not enlarged. There Are no abdominal masses. There are no bruits.  The pedal pulses are good.  There is no phlebitis or edema.  There is no cyanosis or clubbing. Integument reveals probable candida dermatitis under breasts.  The rash under her neck has improved.   Assessment / Plan:  Continue  same medication.  Add Mycolog cream.  Recheck in 4 months for followup office visit EKG and fasting lab work including TSH

## 2012-01-12 ENCOUNTER — Other Ambulatory Visit: Payer: Self-pay | Admitting: Cardiology

## 2012-01-14 ENCOUNTER — Telehealth: Payer: Self-pay | Admitting: *Deleted

## 2012-01-14 ENCOUNTER — Telehealth: Payer: Self-pay | Admitting: Cardiology

## 2012-01-14 NOTE — Telephone Encounter (Signed)
Advised of lab results 

## 2012-01-14 NOTE — Telephone Encounter (Signed)
Fu msg Patient calling back about lab results that was mailed to her

## 2012-01-14 NOTE — Telephone Encounter (Signed)
Message copied by Burnell Blanks on Wed Jan 14, 2012  4:23 PM ------      Message from: Cassell Clement      Created: Thu Jan 08, 2012  9:53 AM       Please report.  Thyroid is normal.  Lytes are normal. Cholesterol stable.  Two of liver tests are slightly elevated.  This may be from fatty liver from being overweight.  The tricor can also do this. I want her to decrease her tricor to every other day.  Work hard on diet and weight loss.

## 2012-01-14 NOTE — Telephone Encounter (Signed)
Advised of results and medication change

## 2012-03-04 ENCOUNTER — Telehealth: Payer: Self-pay | Admitting: Cardiology

## 2012-03-04 NOTE — Telephone Encounter (Signed)
Spoke with patient and place was actually much better

## 2012-03-04 NOTE — Telephone Encounter (Signed)
Pt has a bright red area under her breast and she wants to know if she should use the same cream she has from her last break out

## 2012-03-08 ENCOUNTER — Telehealth: Payer: Self-pay | Admitting: Oncology

## 2012-03-08 NOTE — Telephone Encounter (Signed)
aware of 4/10 appt    aom 

## 2012-03-12 ENCOUNTER — Telehealth: Payer: Self-pay | Admitting: Oncology

## 2012-03-12 NOTE — Telephone Encounter (Signed)
pt called to r/s her appts,r/s to 4/25 and 5/1     aom

## 2012-03-16 ENCOUNTER — Other Ambulatory Visit: Payer: Self-pay | Admitting: *Deleted

## 2012-03-16 DIAGNOSIS — F419 Anxiety disorder, unspecified: Secondary | ICD-10-CM

## 2012-03-19 ENCOUNTER — Other Ambulatory Visit: Payer: Self-pay | Admitting: Dermatology

## 2012-03-21 MED ORDER — ALPRAZOLAM 1 MG PO TABS: 1.0000 mg | ORAL_TABLET | Freq: Two times a day (BID) | ORAL | Status: DC

## 2012-03-22 ENCOUNTER — Other Ambulatory Visit: Payer: Self-pay | Admitting: Cardiology

## 2012-03-30 ENCOUNTER — Other Ambulatory Visit: Payer: 59 | Admitting: Lab

## 2012-04-01 ENCOUNTER — Other Ambulatory Visit (HOSPITAL_BASED_OUTPATIENT_CLINIC_OR_DEPARTMENT_OTHER): Payer: 59 | Admitting: Lab

## 2012-04-01 DIAGNOSIS — Z170 Estrogen receptor positive status [ER+]: Secondary | ICD-10-CM

## 2012-04-01 DIAGNOSIS — C50419 Malignant neoplasm of upper-outer quadrant of unspecified female breast: Secondary | ICD-10-CM

## 2012-04-01 LAB — COMPREHENSIVE METABOLIC PANEL
ALT: 31 U/L (ref 0–35)
BUN: 24 mg/dL — ABNORMAL HIGH (ref 6–23)
CO2: 26 mEq/L (ref 19–32)
Calcium: 9.4 mg/dL (ref 8.4–10.5)
Creatinine, Ser: 1.23 mg/dL — ABNORMAL HIGH (ref 0.50–1.10)
Total Bilirubin: 0.3 mg/dL (ref 0.3–1.2)

## 2012-04-01 LAB — CBC WITH DIFFERENTIAL/PLATELET
BASO%: 0.5 % (ref 0.0–2.0)
Basophils Absolute: 0 10*3/uL (ref 0.0–0.1)
EOS%: 0.2 % (ref 0.0–7.0)
HCT: 36.9 % (ref 34.8–46.6)
LYMPH%: 44.7 % (ref 14.0–49.7)
MCH: 31.4 pg (ref 25.1–34.0)
MCHC: 33.9 g/dL (ref 31.5–36.0)
MCV: 92.7 fL (ref 79.5–101.0)
MONO%: 9.7 % (ref 0.0–14.0)
NEUT%: 44.9 % (ref 38.4–76.8)
lymph#: 2.3 10*3/uL (ref 0.9–3.3)

## 2012-04-01 LAB — CANCER ANTIGEN 27.29: CA 27.29: 12 U/mL (ref 0–39)

## 2012-04-06 ENCOUNTER — Ambulatory Visit: Payer: 59 | Admitting: Oncology

## 2012-04-07 ENCOUNTER — Telehealth: Payer: Self-pay | Admitting: *Deleted

## 2012-04-07 ENCOUNTER — Ambulatory Visit (HOSPITAL_BASED_OUTPATIENT_CLINIC_OR_DEPARTMENT_OTHER): Payer: 59 | Admitting: Oncology

## 2012-04-07 VITALS — BP 118/71 | HR 89 | Temp 98.5°F | Ht 62.0 in | Wt 194.7 lb

## 2012-04-07 DIAGNOSIS — Z170 Estrogen receptor positive status [ER+]: Secondary | ICD-10-CM

## 2012-04-07 DIAGNOSIS — J029 Acute pharyngitis, unspecified: Secondary | ICD-10-CM

## 2012-04-07 DIAGNOSIS — C50419 Malignant neoplasm of upper-outer quadrant of unspecified female breast: Secondary | ICD-10-CM

## 2012-04-07 DIAGNOSIS — C50919 Malignant neoplasm of unspecified site of unspecified female breast: Secondary | ICD-10-CM | POA: Insufficient documentation

## 2012-04-07 NOTE — Progress Notes (Signed)
ID: Abagale Boulos [place]   DOB: 09/10/1941  MR#: [id number]  WUJ#:[id number]  HISTORY OF PRESENT ILLNESS: Ms. Wrobleski had routine screening mammography at the [facility] on September 06, 2009. There was a possible abnormality in the upper outer quadrant of the right breast so she was brought back for diagnostic studies September 12, 2009.  Magnification views confirmed a 1.4 spiculated mass in the upper outer quadrant of the right breast with a few microcalcifications associated with it.  By ultrasound, this measured 1.5 cm and was highly suggestive of malignancy.  Biopsy was performed the same day and showed ([id number]) an invasive lobular carcinoma which was 92% ER and 44% PR positive.  The proliferation marker was 13%.  The tumor did not overexpress Her-2 with a ratio of 1.31.    With this information, the patient was referred to Dr. Luisa Hart and after appropriate discussion, she underwent definitive right lumpectomy and axillary sentinel lymph node sampling October 17, 2009.  The final pathology there ([id number]) showed a 1.5 cm invasive lobular carcinoma, grade 1, with negative though closed margins (the in situ component was at 1 mm from the superior, inferior and medial margins) with no evidence of lymphovascular invasion and the single sentinel lymph node clear.  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    INTERVAL HISTORY: She is very stable, living in [place], and volunteering at her church. The interval history is dominated by orthopedic problems  REVIEW OF SYSTEMS: The biggest problem she is having is a sore throat. This started yesterday. She has been gargling for this and it is already better. Aside from that she complains of herniated discs and significant problems with her knees. She takes Naprosyn for this. She describes herself is anxious and depressed, and is quite deconditioned. She is participating in exercises in her retirement community. Otherwise a detailed review of  systems today was noncontributory   PAST MEDICAL HISTORY: Past Medical History  Diagnosis Date  . Bipolar 1 disorder   . Schizo-affective schizophrenia   . Anxiety   . Morbid obesity   . Hypertension   . Hyperlipidemia   . Osteoarthritis   . Arrhythmia     right bundle branch block  . Thyroid disease     hypothyroidism  1. Dilated cardiomyopathy which the patient tells me does not limit her functional status.   2. History of hypertension.   3. History of bundle branch block. 4. History of a heart murmur. 5. History of osteoarthritis particularly involving the knees. 6. History of hyperlipidemia. 7. History of rosacea. 8. History of hypothyroidism. 9. History of psychiatric disease involving hospitalization in 1999 with a diagnosis of bipolar disorder, chronic schizophrenia and early Alzheimer's disease followed by Dr. Evelene Croon.   10. The patient has a 28 pack-year tobacco abuse history but quit 30 years ago.   11. She is status post bilateral cataract surgery. 12. Status post bilateral tonsillectomy and adenoidectomy. Status post cholecystectomy.     FAMILY HISTORY Family History  Problem Relation Age of Onset  . Hypertension Mother   The patient's mother died from pneumonia at the age of 22 in the setting of schizophrenia.  The patient's father died at the age of 35.  The patient's only sibling was a brother who died from lung cancer in his fifties.  He also had schizophrenia.    GYNECOLOGIC HISTORY: She is GX P0.  She went through the change of life at the age of 57, took hormone replacement about 2  years.  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  She is now retired.  She has lived in [place] about 30 years.  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  She lives in [place].  She attends [employer].      ADVANCED DIRECTIVES:  HEALTH MAINTENANCE: History  Substance Use Topics  . Smoking status: Former Smoker    Quit date: 12/08/1978  . Smokeless tobacco: Never Used  . Alcohol Use: No     Colonoscopy:  PAP:  Bone density:  Lipid panel:  Allergies  Allergen Reactions  . Macrodantin   . Prozac (Fluoxetine Hcl)   . Sertraline Hcl   . Sulfa Antibiotics   . Wellbutrin (Bupropion Hcl)     Current Outpatient Prescriptions  Medication Sig Dispense Refill  . acetaminophen (TYLENOL) 500 MG tablet Take 500 mg by mouth every 6 (six) hours as needed.        . ALPRAZolam (XANAX) 1 MG tablet Take 1 tablet (1 mg total) by mouth 2 (two) times daily. Also taking .5mg  prn  60 tablet  5  . aspirin 325 MG EC tablet Take 325 mg by mouth daily.        Marland Kitchen CORAL CALCIUM PO Take by mouth.        . diphenhydrAMINE (BENADRYL) 25 MG tablet Take 25 mg by mouth every 6 (six) hours as needed. As directed      . donepezil (ARICEPT) 10 MG tablet Take 10 mg by mouth at bedtime as needed.        . fenofibrate (TRICOR) 145 MG tablet Take 145 mg by mouth as directed. 1 tablet every other day      . fish oil-omega-3 fatty acids 1000 MG capsule Take 2 g by mouth daily.        . hydrocortisone cream 1 % Apply topically 2 (two) times daily.  30 g  2  . lamoTRIgine (LAMICTAL) 150 MG tablet Take 300 mg by mouth daily.        . memantine (NAMENDA) 10 MG tablet Take 10 mg by mouth 2 (two) times daily.        . metoprolol tartrate (LOPRESSOR) 25 MG tablet Take 1 tablet (25 mg total) by mouth daily.  30 tablet  11  . Multiple Vitamin (MULTIVITAMIN) tablet Take 1 tablet by mouth daily.        Marland Kitchen NIASPAN 500 MG CR tablet TAKE 1 TABLET BY MOUTH PRIOR TO BEDTIME WITH LOWFAT SNACK AND 325MG  ASPIRING  30 tablet  11  . nystatin-triamcinolone (MYCOLOG II) cream Apply topically 3 (three) times daily as needed.  30 g  1  . OLANZapine (ZYPREXA) 15 MG tablet Take 30 mg by mouth at bedtime.        Marland Kitchen omeprazole  (PRILOSEC) 20 MG capsule Twice a day for 1 week and then daily  40 capsule  11  . ramelteon (ROZEREM) 8 MG tablet Take 8 mg by mouth at bedtime.        Marland Kitchen spironolactone (ALDACTONE) 25 MG tablet Take 1 tablet (25 mg total) by mouth daily.  30 tablet  6  . SYNTHROID 150 MCG tablet TAKE 1 TABLET EVERY DAY  30 tablet  11    OBJECTIVE: Elderly white woman who uses a walker Filed Vitals:  04/07/12 0853  BP: 118/71  Pulse: 89  Temp: 98.5 F (36.9 C)     Body mass index is 35.61 kg/(m^2).    ECOG FS: 2  Sclerae unicteric Oropharynx clear--specifically no thrush or ulcers noted No peripheral adenopathy Lungs no rales or rhonchi Heart regular rate and rhythm Abd benign MSK kyphosis but no focal spinal tenderness, no peripheral edema Neuro: nonfocal Breasts: The right breast is status post lumpectomy. There is no evidence of local recurrence. Left breast is unremarkable  LAB RESULTS: Lab Results  Component Value Date   WBC 5.1 04/01/2012   NEUTROABS 2.3 04/01/2012   HGB 12.5 04/01/2012   HCT 36.9 04/01/2012   MCV 92.7 04/01/2012   PLT 254 04/01/2012      Chemistry      Component Value Date/Time   NA 142 04/01/2012 0926   K 4.5 04/01/2012 0926   CL 107 04/01/2012 0926   CO2 26 04/01/2012 0926   BUN 24* 04/01/2012 0926   CREATININE 1.23* 04/01/2012 0926      Component Value Date/Time   CALCIUM 9.4 04/01/2012 0926   ALKPHOS 42 04/01/2012 0926   AST 30 04/01/2012 0926   ALT 31 04/01/2012 0926   BILITOT 0.3 04/01/2012 0926       Lab Results  Component Value Date   LABCA2 12 04/01/2012    No components found with this basename: [id number]    No results found for this basename: INR:1;PROTIME:1 in the last 168 hours  Urinalysis No results found for this basename: colorurine, appearanceur, labspec, phurine, glucoseu, hgbur, bilirubinur, ketonesur, proteinur, urobilinogen, nitrite, leukocytesur    STUDIES: Mammography at [facility] March of this year was unremarkable.  ASSESSMENT:  71 year old [place] woman status post right lumpectomy and sentinel lymph node sampling in November 2010 for a T1cN0, grade 1, invasive lobular carcinoma, strongly ER/PR positive, HER-2/neu negative, with low MIB-1.  Status post radiation therapy, completed in February 2011. Did not tolerate aromatase inhibitors and is not a candidate for tamoxifen. Followed with observation alone.  PLAN: There is no evidence of disease recurrence. She is going to see [place] again 2 more times, April of 2014 and April of 2015, at which time we will release her from followup.  I think her sore throat is viral, and all I would do is symptomatic treatment as she is already doing. She will let us know if she develops a cough, shortness of breath, or fever.  Her creatinine has increased slightly. It was 1.01 in 2011, 1.11 last year, now 1.23. This could be perhaps due to a little dehydration, but possibly it is due to the Naprosyn that she is taking for arthritis pain. I think it would be useful for her to have this repeated in 4 or 6 weeks just to make sure it is not progressive and she will discuss this with her primary care physician at the next visit.   Izabella Marcantel C    04/07/2012

## 2012-04-07 NOTE — Telephone Encounter (Signed)
gave patient appointment for 04-2013 printed out calendar and gave to the patient gave patient appointment lab only order in one month

## 2012-04-08 ENCOUNTER — Telehealth: Payer: Self-pay | Admitting: Cardiology

## 2012-04-08 NOTE — Telephone Encounter (Signed)
Patient called stated she already had something called in for sore throat.Advised to call back if needed.

## 2012-04-08 NOTE — Telephone Encounter (Signed)
New Problem:     Patient called in because she has a really sore throat and would like some prescriptions called in.  Please call back, but the patient will be away from her room at 10.

## 2012-04-12 ENCOUNTER — Other Ambulatory Visit: Payer: Self-pay | Admitting: Cardiology

## 2012-04-12 NOTE — Telephone Encounter (Signed)
Refilled spironolactone

## 2012-05-11 ENCOUNTER — Other Ambulatory Visit: Payer: Self-pay | Admitting: *Deleted

## 2012-05-11 DIAGNOSIS — C50919 Malignant neoplasm of unspecified site of unspecified female breast: Secondary | ICD-10-CM

## 2012-05-12 ENCOUNTER — Other Ambulatory Visit (HOSPITAL_BASED_OUTPATIENT_CLINIC_OR_DEPARTMENT_OTHER): Payer: 59 | Admitting: Lab

## 2012-05-12 DIAGNOSIS — C50919 Malignant neoplasm of unspecified site of unspecified female breast: Secondary | ICD-10-CM

## 2012-05-12 LAB — CBC WITH DIFFERENTIAL/PLATELET
Basophils Absolute: 0 10*3/uL (ref 0.0–0.1)
Eosinophils Absolute: 0 10*3/uL (ref 0.0–0.5)
HCT: 36.2 % (ref 34.8–46.6)
HGB: 12.2 g/dL (ref 11.6–15.9)
MCH: 31.2 pg (ref 25.1–34.0)
MONO#: 0.5 10*3/uL (ref 0.1–0.9)
NEUT#: 2.2 10*3/uL (ref 1.5–6.5)
NEUT%: 40.2 % (ref 38.4–76.8)
RDW: 14.1 % (ref 11.2–14.5)
WBC: 5.4 10*3/uL (ref 3.9–10.3)
lymph#: 2.7 10*3/uL (ref 0.9–3.3)

## 2012-05-12 LAB — COMPREHENSIVE METABOLIC PANEL
Albumin: 4.2 g/dL (ref 3.5–5.2)
Alkaline Phosphatase: 39 U/L (ref 39–117)
BUN: 19 mg/dL (ref 6–23)
CO2: 27 mEq/L (ref 19–32)
Calcium: 9.7 mg/dL (ref 8.4–10.5)
Chloride: 107 mEq/L (ref 96–112)
Glucose, Bld: 93 mg/dL (ref 70–99)
Potassium: 4.3 mEq/L (ref 3.5–5.3)
Sodium: 142 mEq/L (ref 135–145)
Total Protein: 6.4 g/dL (ref 6.0–8.3)

## 2012-05-13 ENCOUNTER — Other Ambulatory Visit: Payer: 59 | Admitting: Lab

## 2012-05-13 ENCOUNTER — Ambulatory Visit (INDEPENDENT_AMBULATORY_CARE_PROVIDER_SITE_OTHER): Payer: 59 | Admitting: Cardiology

## 2012-05-13 ENCOUNTER — Encounter: Payer: Self-pay | Admitting: Cardiology

## 2012-05-13 ENCOUNTER — Other Ambulatory Visit (INDEPENDENT_AMBULATORY_CARE_PROVIDER_SITE_OTHER): Payer: 59

## 2012-05-13 VITALS — BP 128/80 | HR 78 | Ht 62.0 in | Wt 195.0 lb

## 2012-05-13 DIAGNOSIS — L309 Dermatitis, unspecified: Secondary | ICD-10-CM

## 2012-05-13 DIAGNOSIS — E039 Hypothyroidism, unspecified: Secondary | ICD-10-CM

## 2012-05-13 DIAGNOSIS — L259 Unspecified contact dermatitis, unspecified cause: Secondary | ICD-10-CM

## 2012-05-13 DIAGNOSIS — E7800 Pure hypercholesterolemia, unspecified: Secondary | ICD-10-CM

## 2012-05-13 DIAGNOSIS — I119 Hypertensive heart disease without heart failure: Secondary | ICD-10-CM

## 2012-05-13 LAB — BASIC METABOLIC PANEL
CO2: 27 mEq/L (ref 19–32)
Calcium: 9.7 mg/dL (ref 8.4–10.5)
Chloride: 106 mEq/L (ref 96–112)
Glucose, Bld: 88 mg/dL (ref 70–99)
Sodium: 141 mEq/L (ref 135–145)

## 2012-05-13 LAB — CBC WITH DIFFERENTIAL/PLATELET
Basophils Absolute: 0 10*3/uL (ref 0.0–0.1)
Basophils Relative: 0.2 % (ref 0.0–3.0)
Eosinophils Absolute: 0 10*3/uL (ref 0.0–0.7)
Hemoglobin: 12.3 g/dL (ref 12.0–15.0)
Lymphocytes Relative: 46.7 % — ABNORMAL HIGH (ref 12.0–46.0)
Lymphs Abs: 2.7 10*3/uL (ref 0.7–4.0)
MCHC: 32.8 g/dL (ref 30.0–36.0)
MCV: 94 fl (ref 78.0–100.0)
Monocytes Absolute: 0.5 10*3/uL (ref 0.1–1.0)
Neutro Abs: 2.5 10*3/uL (ref 1.4–7.7)
RBC: 3.98 Mil/uL (ref 3.87–5.11)
RDW: 14.3 % (ref 11.5–14.6)

## 2012-05-13 LAB — LIPID PANEL
HDL: 52.2 mg/dL (ref >39.00)
Total CHOL/HDL Ratio: 4
Triglycerides: 180 mg/dL — ABNORMAL HIGH (ref 0.0–149.0)

## 2012-05-13 LAB — HEPATIC FUNCTION PANEL
ALT: 39 U/L — ABNORMAL HIGH (ref 0–35)
AST: 39 U/L — ABNORMAL HIGH (ref 0–37)
Albumin: 4.2 g/dL (ref 3.5–5.2)
Alkaline Phosphatase: 39 U/L (ref 39–117)

## 2012-05-13 MED ORDER — HYDROCORTISONE 1 % EX CREA: TOPICAL_CREAM | Freq: Two times a day (BID) | CUTANEOUS | Status: DC

## 2012-05-13 NOTE — Progress Notes (Signed)
Joyce Copa Griggs Date of Birth:  May 02, 1941 [facility] [address] [place], [place]  [address] [phone]  Fax   [phone]  HPI: This pleasant 71 year old woman is seen for a scheduled four-month followup office visit.  She has a past history of antral hypertension and dyslipidemia.  She also has a complex psychiatric diagnosis including OCD, bipolar syndrome, borderline schizophrenia, and early Alzheimer's.  Since last visit she's had no new cardiac symptoms.  She has had some difficulty swallowing thin liquids.  She finds that she can drink orange juice because it is thicker.  However she has gained 7 pounds since last visit and the orange juice is not helping that.  We will have her add Thicket to her other drinks and water. Current Outpatient Prescriptions  Medication Sig Dispense Refill  . acetaminophen (TYLENOL) 500 MG tablet Take 500 mg by mouth every 6 (six) hours as needed.        . ALPRAZolam (XANAX) 1 MG tablet Take 1 tablet (1 mg total) by mouth 2 (two) times daily. Also taking .5mg  prn  60 tablet  5  . aspirin 325 MG EC tablet Take 325 mg by mouth daily.        Marland Kitchen CORAL CALCIUM PO Take by mouth.        . diphenhydrAMINE (BENADRYL) 25 MG tablet Take 25 mg by mouth every 6 (six) hours as needed. As directed      . donepezil (ARICEPT) 10 MG tablet Take 10 mg by mouth at bedtime as needed.        . fenofibrate (TRICOR) 145 MG tablet Take 145 mg by mouth as directed. 1 tablet every other day      . fish oil-omega-3 fatty acids 1000 MG capsule Take 2 g by mouth daily.        . hydrocortisone cream 1 % Apply topically 2 (two) times daily.  30 g  2  . lamoTRIgine (LAMICTAL) 150 MG tablet Take 300 mg by mouth daily.        . memantine (NAMENDA) 10 MG tablet Take 10 mg by mouth 2 (two) times daily.        . metoprolol tartrate (LOPRESSOR) 25 MG tablet Take 1 tablet (25 mg total) by mouth daily.  30 tablet  11  . Multiple Vitamin (MULTIVITAMIN) tablet Take 1 tablet  by mouth daily.        Marland Kitchen NIASPAN 500 MG CR tablet TAKE 1 TABLET BY MOUTH PRIOR TO BEDTIME WITH LOWFAT SNACK AND 325MG  ASPIRING  30 tablet  11  . nystatin-triamcinolone (MYCOLOG II) cream Apply topically 3 (three) times daily as needed.  30 g  1  . OLANZapine (ZYPREXA) 15 MG tablet Take 30 mg by mouth at bedtime.        Marland Kitchen omeprazole (PRILOSEC) 20 MG capsule Twice a day for 1 week and then daily  40 capsule  11  . ramelteon (ROZEREM) 8 MG tablet Take 8 mg by mouth at bedtime.        Marland Kitchen spironolactone (ALDACTONE) 25 MG tablet TAKE 1 TABLET (25 MG TOTAL) BY MOUTH DAILY.  30 tablet  6  . SYNTHROID 150 MCG tablet TAKE 1 TABLET EVERY DAY  30 tablet  11    Allergies  Allergen Reactions  . Macrodantin   . Prozac (Fluoxetine Hcl)   . Sertraline Hcl   . Sulfa Antibiotics   . Wellbutrin (Bupropion Hcl)     Patient Active Problem List  Diagnoses  .  Hypercholesterolemia  . Hypothyroidism  . Dementia  . Benign hypertensive heart disease without heart failure  . Dermatitis  . Breast cancer    History  Smoking status  . Former Smoker  . Quit date: 12/08/1978  Smokeless tobacco  . Never Used    History  Alcohol Use No    Family History  Problem Relation Age of Onset  . Hypertension Mother     Review of Systems: The patient denies any heat or cold intolerance.  No weight gain or weight loss.  The patient denies headaches or blurry vision.  There is no cough or sputum production.  The patient denies dizziness.  There is no hematuria or hematochezia.  The patient denies any muscle aches or arthritis.  The patient denies any rash.  The patient denies frequent falling or instability.  There is no history of depression or anxiety.  All other systems were reviewed and are negative.   Physical Exam: Filed Vitals:   05/13/12 0845  BP: 128/80  Pulse: 78   the general appearance reveals a slightly obese woman in no distress.The head and neck exam reveals pupils equal and reactive.   Extraocular movements are full.  There is no scleral icterus.  The mouth and pharynx are normal.  The neck is supple.  The carotids reveal no bruits.  The jugular venous pressure is normal.  The  thyroid is not enlarged.  There is no lymphadenopathy.  The chest is clear to percussion and auscultation.  There are no rales or rhonchi.  Expansion of the chest is symmetrical.  The precordium is quiet.  The first heart sound is normal.  The second heart sound is physiologically split.  There is no murmur gallop rub or click.  There is no abnormal lift or heave.  The abdomen is soft and nontender.  The bowel sounds are normal.  The liver and spleen are not enlarged.  There are no abdominal masses.  There are no abdominal bruits.  Extremities reveal good pedal pulses.  There is no phlebitis or edema.  There is no cyanosis or clubbing.  Strength is normal and symmetrical in all extremities.  There is no lateralizing weakness.  There are no sensory deficits.  The skin is warm and dry.  There is no rash.      Assessment / Plan: Continue same medication.  Recheck in 4 months for office visit TSH, free T4, lipid panel hepatic function panel and basal metabolic panel

## 2012-05-13 NOTE — Progress Notes (Signed)
Quick Note:  Please report to patient. The recent labs are stable. Continue same medication and careful diet. TG are higher. Watch carbs and try to lose weight. ______

## 2012-05-13 NOTE — Assessment & Plan Note (Addendum)
The patient is unable to take statins.  She is on TriCor which she is tolerating.  She is not having myalgias.

## 2012-05-13 NOTE — Assessment & Plan Note (Signed)
She is clinically euthyroid on current dose of thyroid hormone

## 2012-05-13 NOTE — Assessment & Plan Note (Signed)
Patient is not having any chest pain or shortness of breath or palpitations.  She exercises on a regular basis.  She attributes a lot of her weight gain to her psychiatric medications.

## 2012-05-13 NOTE — Patient Instructions (Signed)
Try adding Thick-it to drinks to see if this helps   Your physician recommends that you continue on your current medications as directed. Please refer to the Current Medication list given to you today.  Your physician wants you to follow-up in: 4 months with fasting labs You will receive a reminder letter in the mail two months in advance. If you don't receive a letter, please call our office to schedule the follow-up appointment.

## 2012-05-14 ENCOUNTER — Telehealth: Payer: Self-pay | Admitting: *Deleted

## 2012-05-14 NOTE — Telephone Encounter (Signed)
Message copied by Burnell Blanks on Fri May 14, 2012  9:01 AM ------      Message from: Cassell Clement      Created: Thu May 13, 2012  7:30 PM       Please report to patient.  The recent labs are stable. Continue same medication and careful diet. TG are higher.  Watch carbs and try to lose weight.

## 2012-05-14 NOTE — Telephone Encounter (Signed)
No answer, mailed copy and highlighted  Dr. Yevonne Pax comments.  Wrote to call if any questions

## 2012-05-31 ENCOUNTER — Other Ambulatory Visit: Payer: Self-pay | Admitting: Cardiology

## 2012-05-31 NOTE — Telephone Encounter (Signed)
Refilled metoprolol 

## 2012-06-04 ENCOUNTER — Telehealth: Payer: Self-pay | Admitting: Cardiology

## 2012-06-04 ENCOUNTER — Telehealth: Payer: Self-pay | Admitting: Oncology

## 2012-06-04 NOTE — Telephone Encounter (Signed)
No answer, will try back Monday

## 2012-06-04 NOTE — Telephone Encounter (Signed)
Please return call to patient at [phone] regarding labs from last appnt.

## 2012-06-04 NOTE — Telephone Encounter (Signed)
Pt called asking for lab results drawn 05/12/12.  Both the CBC and CMET are within normal limits.   Explained to pt that [name] documented that she had mailed the results to her in early June.   The patient stated she did not receive them.   The patient has left a message for Dr.Blackbill';s office as well.

## 2012-06-07 NOTE — Telephone Encounter (Signed)
No answer

## 2012-06-07 NOTE — Telephone Encounter (Signed)
Advised of labs since she never received a copy

## 2012-07-12 ENCOUNTER — Other Ambulatory Visit: Payer: Self-pay | Admitting: Cardiology

## 2012-07-12 NOTE — Telephone Encounter (Signed)
Fax Received. Refill Completed. Lillyonna Armstead Chowoe (R.M.A)   

## 2012-08-19 ENCOUNTER — Telehealth: Payer: Self-pay | Admitting: Cardiology

## 2012-08-19 NOTE — Telephone Encounter (Signed)
New Problem:    Patient called in needing a new prescription sent in for her Niaspan.  Please call back if you have any questions.

## 2012-08-25 MED ORDER — NIACIN ER (ANTIHYPERLIPIDEMIC) 500 MG PO TBCR: 500.0000 mg | EXTENDED_RELEASE_TABLET | ORAL | Status: DC

## 2012-08-25 NOTE — Telephone Encounter (Signed)
Sent new Rx for Niaspan into [id number] - [address] [place], [place] as requested.  Caralee Ates, CMA.   Called pt LVM informing her I had sent in her Niaspan Rx [facility] as [facility].  Caralee Ates, CMA

## 2012-08-26 ENCOUNTER — Other Ambulatory Visit: Payer: Self-pay

## 2012-10-14 ENCOUNTER — Other Ambulatory Visit: Payer: Self-pay | Admitting: Dermatology

## 2012-10-28 ENCOUNTER — Ambulatory Visit (INDEPENDENT_AMBULATORY_CARE_PROVIDER_SITE_OTHER): Payer: 59 | Admitting: Cardiology

## 2012-10-28 ENCOUNTER — Encounter: Payer: Self-pay | Admitting: Cardiology

## 2012-10-28 VITALS — BP 122/66 | HR 66 | Resp 18 | Ht 62.0 in | Wt 200.8 lb

## 2012-10-28 DIAGNOSIS — K582 Mixed irritable bowel syndrome: Secondary | ICD-10-CM | POA: Insufficient documentation

## 2012-10-28 DIAGNOSIS — F0390 Unspecified dementia without behavioral disturbance: Secondary | ICD-10-CM

## 2012-10-28 DIAGNOSIS — I119 Hypertensive heart disease without heart failure: Secondary | ICD-10-CM

## 2012-10-28 DIAGNOSIS — E7800 Pure hypercholesterolemia, unspecified: Secondary | ICD-10-CM

## 2012-10-28 DIAGNOSIS — E039 Hypothyroidism, unspecified: Secondary | ICD-10-CM

## 2012-10-28 DIAGNOSIS — K589 Irritable bowel syndrome without diarrhea: Secondary | ICD-10-CM

## 2012-10-28 LAB — TSH: TSH: 1.58 u[IU]/mL (ref 0.35–5.50)

## 2012-10-28 LAB — HEPATIC FUNCTION PANEL
ALT: 40 U/L — ABNORMAL HIGH (ref 0–35)
AST: 35 U/L (ref 0–37)
Alkaline Phosphatase: 38 U/L — ABNORMAL LOW (ref 39–117)
Bilirubin, Direct: 0 mg/dL (ref 0.0–0.3)
Total Protein: 7 g/dL (ref 6.0–8.3)

## 2012-10-28 LAB — LIPID PANEL: Total CHOL/HDL Ratio: 4

## 2012-10-28 LAB — BASIC METABOLIC PANEL
CO2: 26 mEq/L (ref 19–32)
Chloride: 104 mEq/L (ref 96–112)
Sodium: 138 mEq/L (ref 135–145)

## 2012-10-28 NOTE — Assessment & Plan Note (Signed)
The patient has not been having any chest pain or shortness of breath.  No palpitations

## 2012-10-28 NOTE — Patient Instructions (Signed)
Add Metamucil 2-3 teaspoons in water daily, continue all other medications   Your physician recommends that you schedule a follow-up appointment in: 4 months with fasting labs (lp/bmet/hfp) and EKG  Will obtain labs today and call you with the results (lp/bmet/hfp/twh/t4)

## 2012-10-28 NOTE — Progress Notes (Signed)
[name] Date of Birth:  Jul 28, 1941 [facility] [address] [place], [place]  [address] [phone]  Fax   [phone]  HPI:  This pleasant 71 year old woman is seen for a scheduled four-month followup office visit. She has a past history of essential hypertension and dyslipidemia. She also has a complex psychiatric diagnosis including OCD, bipolar syndrome, borderline schizophrenia, and early Alzheimer's. Since last visit she's had no new cardiac symptoms.  Since last visit her rash which she had been experiencing on her upper chest as been successfully treated by her dermatologist Dr. Nicholas Lose.  The patient has a problem with exogenous obesity.  She has gained 5 more pounds.  She has now embarked on a new weight loss program.  Since last visit the patient has been having some increased problems with irritable bowel syndrome and has occasional near fecal incontinence accidents  Current Outpatient Prescriptions  Medication Sig Dispense Refill  . ALPRAZolam (XANAX) 1 MG tablet Take 1 tablet (1 mg total) by mouth 2 (two) times daily. Also taking .5mg  prn  60 tablet  5  . aspirin 325 MG EC tablet Take 325 mg by mouth daily.        Marland Kitchen CORAL CALCIUM PO Take by mouth.        . diphenhydrAMINE (BENADRYL) 25 MG tablet Take 25 mg by mouth every 6 (six) hours as needed. As directed      . donepezil (ARICEPT) 10 MG tablet Take 10 mg by mouth at bedtime as needed.        . fenofibrate (TRICOR) 145 MG tablet Take 145 mg by mouth as directed. 1 tablet every other day      . fish oil-omega-3 fatty acids 1000 MG capsule Take 2 g by mouth daily.        Marland Kitchen lamoTRIgine (LAMICTAL) 150 MG tablet Take 300 mg by mouth daily.        . memantine (NAMENDA) 10 MG tablet Take 10 mg by mouth 2 (two) times daily.        . metoprolol tartrate (LOPRESSOR) 25 MG tablet TAKE 1 TABLET (25 MG TOTAL) BY MOUTH DAILY.  30 tablet  11  . Multiple Vitamin (MULTIVITAMIN) tablet Take 1 tablet by mouth daily.         . Naproxen Sodium (ALEVE) 220 MG CAPS Take 220 mg by mouth 2 (two) times daily.      . niacin (NIASPAN) 500 MG CR tablet Take 1 tablet (500 mg total) by mouth as directed. TAKE 1 TABLET BY MOUTH PRIOR TO BEDTIME WITH LOWFAT SNACK AND 325MG  ASPIRING  30 tablet  11  . OLANZapine (ZYPREXA) 15 MG tablet Take 30 mg by mouth at bedtime.        Marland Kitchen omeprazole (PRILOSEC) 20 MG capsule TAKE ONE CAPSULE BY MOUTH TWICE A DAY FOR SEVEN DAYS, THEN 1 CAP ONCE DAILY  40 capsule  6  . Psyllium (METAMUCIL PO) Take by mouth. 2-3 teaspoons in water daily      . ramelteon (ROZEREM) 8 MG tablet Take 8 mg by mouth at bedtime.        Marland Kitchen spironolactone (ALDACTONE) 25 MG tablet TAKE 1 TABLET (25 MG TOTAL) BY MOUTH DAILY.  30 tablet  6  . SYNTHROID 150 MCG tablet TAKE 1 TABLET EVERY DAY  30 tablet  11  . acetaminophen (TYLENOL) 500 MG tablet Take 500 mg by mouth every 6 (six) hours as needed.        Marland Kitchen  hydrocortisone cream 1 % Apply topically 2 (two) times daily.  30 g  2  . nystatin-triamcinolone (MYCOLOG II) cream Apply topically 3 (three) times daily as needed.  30 g  1    Allergies  Allergen Reactions  . Macrodantin   . Prozac (Fluoxetine Hcl)   . Sertraline Hcl   . Sulfa Antibiotics   . Wellbutrin (Bupropion Hcl)     Patient Active Problem List  Diagnosis  . Hypercholesterolemia  . Hypothyroidism  . Dementia  . Benign hypertensive heart disease without heart failure  . Dermatitis  . Breast cancer    History  Smoking status  . Former Smoker  . Quit date: 12/08/1978  Smokeless tobacco  . Never Used    History  Alcohol Use No    Family History  Problem Relation Age of Onset  . Hypertension Mother     Review of Systems: The patient denies any heat or cold intolerance.  No weight gain or weight loss.  The patient denies headaches or blurry vision.  There is no cough or sputum production.  The patient denies dizziness.  There is no hematuria or hematochezia.  The patient denies any  muscle aches or arthritis.  The patient denies any rash.  The patient denies frequent falling or instability.  There is no history of depression or anxiety.  All other systems were reviewed and are negative.   Physical Exam: Filed Vitals:   10/28/12 0926  BP: 122/66  Pulse: 66  Resp: 18   the general appearance reveals a well-developed mildly obese woman in no distress.The head and neck exam reveals pupils equal and reactive.  Extraocular movements are full.  There is no scleral icterus.  The mouth and pharynx are normal.  The neck is supple.  The carotids reveal no bruits.  The jugular venous pressure is normal.  The  thyroid is not enlarged.  There is no lymphadenopathy.  The chest is clear to percussion and auscultation.  There are no rales or rhonchi.  Expansion of the chest is symmetrical.  The precordium is quiet.  The first heart sound is normal.  The second heart sound is physiologically split.  There is a soft systolic ejection murmur at the base.  There is no abnormal lift or heave.  The abdomen is soft and nontender.  The bowel sounds are normal.  The liver and spleen are not enlarged.  There are no abdominal masses.  There are no abdominal bruits.  Extremities reveal good pedal pulses.  There is no phlebitis or edema.  There is no cyanosis or clubbing.  Strength is normal and symmetrical in all extremities.  There is no lateralizing weakness.  There are no sensory deficits.  The skin is warm and dry.  There is no rash.      Assessment / Plan: Continue same medication.  Await today's lab work. Check in 4 months for followup office visit EKG lipid panel hepatic function panel and basal metabolic panel.  Consider echocardiogram after next office visit for evaluation of heart murmur.  I do not find any record of a previous echo in Epic.

## 2012-10-28 NOTE — Progress Notes (Signed)
Quick Note:  Please report to patient. The recent labs are stable. Continue same medication and careful diet. ______ 

## 2012-10-28 NOTE — Assessment & Plan Note (Signed)
Her dementia her remains mild and apparently unchanged since last visit.  She is still able to function independently and manage her own affairs.

## 2012-10-28 NOTE — Assessment & Plan Note (Signed)
For her irritable bowel syndrome we will add Metamucil 2-3 teaspoons in water daily.  Metamucil will also help her hypercholesterolemia

## 2012-11-02 ENCOUNTER — Telehealth: Payer: Self-pay | Admitting: *Deleted

## 2012-11-02 NOTE — Telephone Encounter (Signed)
Advised patient of lab results  

## 2012-11-02 NOTE — Telephone Encounter (Signed)
Message copied by Burnell Blanks on Tue Nov 02, 2012  4:37 PM ------      Message from: Cassell Clement      Created: Thu Oct 28, 2012  8:48 PM       Please report to patient.  The recent labs are stable. Continue same medication and careful diet.

## 2012-11-10 ENCOUNTER — Other Ambulatory Visit: Payer: Self-pay

## 2012-11-10 MED ORDER — SPIRONOLACTONE 25 MG PO TABS: 25.0000 mg | ORAL_TABLET | Freq: Every day | ORAL | Status: DC

## 2013-01-03 ENCOUNTER — Other Ambulatory Visit: Payer: Self-pay

## 2013-01-03 MED ORDER — SYNTHROID 150 MCG PO TABS: 150.0000 ug | ORAL_TABLET | Freq: Every day | ORAL | Status: DC

## 2013-01-17 ENCOUNTER — Other Ambulatory Visit: Payer: Self-pay | Admitting: *Deleted

## 2013-01-17 MED ORDER — FENOFIBRATE 145 MG PO TABS: 145.0000 mg | ORAL_TABLET | ORAL | Status: DC

## 2013-01-19 ENCOUNTER — Other Ambulatory Visit: Payer: Self-pay | Admitting: *Deleted

## 2013-01-19 MED ORDER — FENOFIBRATE 145 MG PO TABS: 145.0000 mg | ORAL_TABLET | ORAL | Status: DC

## 2013-02-23 ENCOUNTER — Encounter: Payer: Self-pay | Admitting: Cardiology

## 2013-02-23 ENCOUNTER — Other Ambulatory Visit (INDEPENDENT_AMBULATORY_CARE_PROVIDER_SITE_OTHER): Payer: 59

## 2013-02-23 ENCOUNTER — Ambulatory Visit (INDEPENDENT_AMBULATORY_CARE_PROVIDER_SITE_OTHER): Payer: 59 | Admitting: Cardiology

## 2013-02-23 VITALS — BP 122/76 | HR 60 | Ht 62.5 in | Wt 192.2 lb

## 2013-02-23 DIAGNOSIS — E039 Hypothyroidism, unspecified: Secondary | ICD-10-CM

## 2013-02-23 DIAGNOSIS — E7800 Pure hypercholesterolemia, unspecified: Secondary | ICD-10-CM

## 2013-02-23 LAB — LIPID PANEL
HDL: 48.3 mg/dL (ref >39.00)
LDL Cholesterol: 106 mg/dL — ABNORMAL HIGH (ref 0–99)
Total CHOL/HDL Ratio: 4

## 2013-02-23 LAB — BASIC METABOLIC PANEL
CO2: 27 mEq/L (ref 19–32)
Chloride: 104 mEq/L (ref 96–112)
Glucose, Bld: 96 mg/dL (ref 70–99)
Potassium: 4.2 mEq/L (ref 3.5–5.1)
Sodium: 140 mEq/L (ref 135–145)

## 2013-02-23 LAB — HEPATIC FUNCTION PANEL
AST: 30 U/L (ref 0–37)
Total Bilirubin: 0.5 mg/dL (ref 0.3–1.2)

## 2013-02-23 NOTE — Assessment & Plan Note (Signed)
The patient denies any chest pain or shortness of breath.  She has not had syncope.  She is chronically unsteady on her feet and uses a walker when traveling at [employer].  She has lost 8 pounds since last visit.  She is taking an herbal preparation from [employer].

## 2013-02-23 NOTE — Patient Instructions (Signed)
Your physician recommends that you continue on your current medications as directed. Please refer to the Current Medication list given to you today.  Your physician wants you to follow-up in: 4 months with fasting labs (lp/bmet/hfp) You will receive a reminder letter in the mail two months in advance. If you don't receive a letter, please call our office to schedule the follow-up appointment.  

## 2013-02-23 NOTE — Progress Notes (Signed)
[name] Date of Birth:  [birth year] [facility] [address] [place], [place]  [address] [phone]  Fax   [phone]  HPI: This pleasant 72 year old woman is seen for a scheduled four-month followup office visit. She has a past history of essential hypertension and dyslipidemia. She also has a complex psychiatric diagnosis including OCD, bipolar syndrome, borderline schizophrenia, and early Alzheimer's. Since last visit she's had no new cardiac symptoms.  She has a past history of right bundle branch block.  She has had previous nuclear stress test showing no evidence of ischemia.  Since last visit the patient has been feeling well.   Current Outpatient Prescriptions  Medication Sig Dispense Refill  . acetaminophen (TYLENOL) 500 MG tablet Take 500 mg by mouth every 6 (six) hours as needed.        . ALPRAZolam (XANAX) 1 MG tablet Take 1 tablet (1 mg total) by mouth 2 (two) times daily. Also taking .5mg  prn  60 tablet  5  . aspirin 325 MG EC tablet Take 325 mg by mouth daily.        Marland Kitchen CORAL CALCIUM PO Take by mouth.        . diphenhydrAMINE (BENADRYL) 25 MG tablet Take 25 mg by mouth every 6 (six) hours as needed. As directed      . donepezil (ARICEPT) 10 MG tablet Take 10 mg by mouth at bedtime as needed.        . fenofibrate (TRICOR) 145 MG tablet Take 1 tablet (145 mg total) by mouth as directed. 1 tablet every other day  30 tablet  5  . fish oil-omega-3 fatty acids 1000 MG capsule Take 2 g by mouth daily.        . hydrocortisone cream 1 % Apply topically 2 (two) times daily.  30 g  2  . lamoTRIgine (LAMICTAL) 150 MG tablet Take 300 mg by mouth daily.        . memantine (NAMENDA) 10 MG tablet Take 10 mg by mouth 2 (two) times daily.        . metoprolol tartrate (LOPRESSOR) 25 MG tablet TAKE 1 TABLET (25 MG TOTAL) BY MOUTH DAILY.  30 tablet  11  . Multiple Vitamin (MULTIVITAMIN) tablet Take 1 tablet by mouth daily.        . niacin (NIASPAN) 500 MG CR tablet  Take 1 tablet (500 mg total) by mouth as directed. TAKE 1 TABLET BY MOUTH PRIOR TO BEDTIME WITH LOWFAT SNACK AND 325MG  ASPIRING  30 tablet  11  . nystatin-triamcinolone (MYCOLOG II) cream Apply topically 3 (three) times daily as needed.  30 g  1  . OLANZapine (ZYPREXA) 15 MG tablet Take 30 mg by mouth at bedtime.        Marland Kitchen omeprazole (PRILOSEC) 20 MG capsule TAKE ONE CAPSULE BY MOUTH TWICE A DAY FOR SEVEN DAYS, THEN 1 CAP ONCE DAILY  40 capsule  6  . ramelteon (ROZEREM) 8 MG tablet Take 8 mg by mouth at bedtime.        Marland Kitchen spironolactone (ALDACTONE) 25 MG tablet Take 1 tablet (25 mg total) by mouth daily.  30 tablet  6  . SYNTHROID 150 MCG tablet Take 1 tablet (150 mcg total) by mouth daily.  30 tablet  11   No current facility-administered medications for this visit.    Allergies  Allergen Reactions  . Macrodantin   . Prozac (Fluoxetine Hcl)   . Sertraline Hcl   . Sulfa Antibiotics   .  Wellbutrin (Bupropion Hcl)     Patient Active Problem List  Diagnosis  . Hypercholesterolemia  . Hypothyroidism  . Dementia  . Benign hypertensive heart disease without heart failure  . Dermatitis  . Breast cancer  . Irritable bowel syndrome with constipation and diarrhea    History  Smoking status  . Former Smoker  . Quit date: 12/08/1978  Smokeless tobacco  . Never Used    History  Alcohol Use No    Family History  Problem Relation Age of Onset  . Hypertension Mother     Review of Systems: The patient denies any heat or cold intolerance.  No weight gain or weight loss.  The patient denies headaches or blurry vision.  There is no cough or sputum production.  The patient denies dizziness.  There is no hematuria or hematochezia.  The patient denies any muscle aches or arthritis.  The patient denies any rash.  The patient denies frequent falling or instability.  There is no history of depression or anxiety.  All other systems were reviewed and are negative.   Physical Exam: Filed  Vitals:   02/23/13 0933  BP: 122/76  Pulse: 60   the general appearance reveals a slightly obese woman in no acute distress.The head and neck exam reveals pupils equal and reactive.  Extraocular movements are full.  There is no scleral icterus.  The mouth and pharynx are normal.  The neck is supple.  The carotids reveal no bruits.  The jugular venous pressure is normal.  The  thyroid is not enlarged.  There is no lymphadenopathy.  The chest is clear to percussion and auscultation.  There are no rales or rhonchi.  Expansion of the chest is symmetrical.  The precordium is quiet.  The first heart sound is normal.  The second heart sound is physiologically split.  There is no murmur gallop rub or click.  There is no abnormal lift or heave.  The abdomen is soft and nontender.  The bowel sounds are normal.  The liver and spleen are not enlarged.  There are no abdominal masses.  There are no abdominal bruits.  Extremities reveal good pedal pulses.  There is no phlebitis or edema.  There is no cyanosis or clubbing.  Strength is normal and symmetrical in all extremities.  There is no lateralizing weakness.  There are no sensory deficits.  The skin is warm and dry.  There is no rash.  EKG shows normal sinus rhythm with right bundle branch block and lateral ST-T wave abnormality    Assessment / Plan:  Continue same medication.  Continue weight loss.  Recheck in 4 months for followup office visit lipid panel hepatic function panel basal metabolic panel TSH.  If we cannot find an old EKG we want to repeat her EKG at her next visit

## 2013-02-23 NOTE — Assessment & Plan Note (Signed)
Patient has a history of right bundle branch block.  EKG today shows normal sinus rhythm with right bundle branch block and widespread ST-T wave abnormalities.  We don't have any old EKGs available at this point to compare.  We'll attempt to obtain old EKGs.  The patient is not having any symptoms from right bundle branch block

## 2013-02-23 NOTE — Assessment & Plan Note (Signed)
Patient has a history of hypercholesterolemia.  She is watching her diet more carefully.  We are rechecking labs today

## 2013-02-24 ENCOUNTER — Encounter: Payer: Self-pay | Admitting: Cardiology

## 2013-02-24 NOTE — Progress Notes (Signed)
Quick Note:  Please report to patient. The recent labs are stable. Continue same medication and careful diet. Continue exercise and weight loss. ______

## 2013-04-07 ENCOUNTER — Ambulatory Visit: Payer: 59 | Admitting: Oncology

## 2013-04-07 ENCOUNTER — Other Ambulatory Visit: Payer: 59 | Admitting: Lab

## 2013-04-11 ENCOUNTER — Telehealth: Payer: Self-pay | Admitting: Cardiology

## 2013-04-11 NOTE — Telephone Encounter (Signed)
Left message to call back  

## 2013-04-11 NOTE — Telephone Encounter (Signed)
New problem   Pt calling about a referral

## 2013-04-12 NOTE — Telephone Encounter (Signed)
Follow-up:    Patient called in returning your call about being referred to Dr. Tedra Coupe.  Please call back.

## 2013-04-12 NOTE — Telephone Encounter (Signed)
Called to schedule appointment and was told by the office they are not contracted with [facility] and will have to pay cash. Advised patient and she will call and schedule, still wants to see him.

## 2013-04-14 ENCOUNTER — Telehealth: Payer: Self-pay | Admitting: *Deleted

## 2013-04-14 NOTE — Telephone Encounter (Signed)
sw pt she wants to r/s her missed appt. gv appt d/t for 05/12/13 lab at 3pm and ov at 3:30pm...td

## 2013-04-19 ENCOUNTER — Other Ambulatory Visit: Payer: Self-pay

## 2013-04-19 MED ORDER — OMEPRAZOLE 20 MG PO CPDR: 20.0000 mg | DELAYED_RELEASE_CAPSULE | Freq: Every day | ORAL | Status: DC

## 2013-04-26 ENCOUNTER — Telehealth: Payer: Self-pay | Admitting: Cardiology

## 2013-04-26 NOTE — Telephone Encounter (Signed)
New problem   Pt will be available after 2pm. Pt having bad headaches last few weeks. Please call pt.

## 2013-04-26 NOTE — Telephone Encounter (Signed)
Follow up    Pt stated to call her after 2:30pm.

## 2013-04-26 NOTE — Telephone Encounter (Signed)
Spoke with patient and scheduled ov for next week. Patient did see psychiatrist this am and she recommended seeing him. Advised patient to go to ED or Urgent care if worse.

## 2013-04-27 ENCOUNTER — Encounter (HOSPITAL_COMMUNITY): Payer: Self-pay | Admitting: *Deleted

## 2013-04-27 ENCOUNTER — Emergency Department (HOSPITAL_COMMUNITY)
Admission: EM | Admit: 2013-04-27 | Discharge: 2013-04-27 | Disposition: A | Discharge status: Home / Self Care | Payer: Medicare PPO | Attending: Emergency Medicine | Admitting: Emergency Medicine

## 2013-04-27 ENCOUNTER — Emergency Department (HOSPITAL_COMMUNITY): Payer: Medicare PPO

## 2013-04-27 DIAGNOSIS — E079 Disorder of thyroid, unspecified: Secondary | ICD-10-CM | POA: Insufficient documentation

## 2013-04-27 DIAGNOSIS — F411 Generalized anxiety disorder: Secondary | ICD-10-CM | POA: Insufficient documentation

## 2013-04-27 DIAGNOSIS — Z7982 Long term (current) use of aspirin: Secondary | ICD-10-CM | POA: Insufficient documentation

## 2013-04-27 DIAGNOSIS — R51 Headache: Secondary | ICD-10-CM

## 2013-04-27 DIAGNOSIS — Z8589 Personal history of malignant neoplasm of other organs and systems: Secondary | ICD-10-CM | POA: Insufficient documentation

## 2013-04-27 DIAGNOSIS — Z87891 Personal history of nicotine dependence: Secondary | ICD-10-CM | POA: Insufficient documentation

## 2013-04-27 DIAGNOSIS — E785 Hyperlipidemia, unspecified: Secondary | ICD-10-CM | POA: Insufficient documentation

## 2013-04-27 DIAGNOSIS — I499 Cardiac arrhythmia, unspecified: Secondary | ICD-10-CM | POA: Insufficient documentation

## 2013-04-27 DIAGNOSIS — F259 Schizoaffective disorder, unspecified: Secondary | ICD-10-CM | POA: Insufficient documentation

## 2013-04-27 DIAGNOSIS — Z79899 Other long term (current) drug therapy: Secondary | ICD-10-CM | POA: Insufficient documentation

## 2013-04-27 DIAGNOSIS — I10 Essential (primary) hypertension: Secondary | ICD-10-CM | POA: Insufficient documentation

## 2013-04-27 DIAGNOSIS — M1990 Unspecified osteoarthritis, unspecified site: Secondary | ICD-10-CM | POA: Insufficient documentation

## 2013-04-27 DIAGNOSIS — F319 Bipolar disorder, unspecified: Secondary | ICD-10-CM | POA: Insufficient documentation

## 2013-04-27 DIAGNOSIS — M542 Cervicalgia: Secondary | ICD-10-CM

## 2013-04-27 HISTORY — DX: Malignant (primary) neoplasm, unspecified: C80.1

## 2013-04-27 MED ORDER — HYDROCODONE-ACETAMINOPHEN 5-325 MG PO TABS: 1.0000 | ORAL_TABLET | Freq: Four times a day (QID) | ORAL | Status: DC | PRN

## 2013-04-27 NOTE — ED Notes (Signed)
Pt from home (resides at [facility]) with reports of a headache for 3 weeks. Pt denies N/V/D and visual disturbances.

## 2013-04-27 NOTE — ED Provider Notes (Signed)
History     CSN: [id number]  Arrival date & time 04/27/13  1342   First MD Initiated Contact with Patient 04/27/13 1350      Chief Complaint  Patient presents with  . Headache    (Consider location/radiation/quality/duration/timing/severity/associated sxs/prior treatment) Patient is a 72 y.o. female presenting with headaches. The history is provided by the patient.  Headache Pain location:  Occipital Quality:  Sharp Radiates to:  L neck and R neck Severity currently:  0/10 Severity at highest:  7/10 Onset quality:  Gradual Duration:  3 weeks Timing:  Intermittent Progression:  Resolved Chronicity:  New Similar to prior headaches: no   Context comment:  Does not seem to be brought on by anything Relieved by:  Acetaminophen Worsened by:  Nothing tried Ineffective treatments:  None tried Associated symptoms: neck pain and neck stiffness   Associated symptoms: no abdominal pain, no dizziness, no facial pain, no fever, no focal weakness, no loss of balance, no nausea, no paresthesias, no photophobia, no seizures, no sinus pressure, no syncope, no tingling, no URI, no vomiting and no weakness   Risk factors comment:  Hx of arthritis in the neck but no trauma or hx of Headaches   Past Medical History  Diagnosis Date  . Bipolar 1 disorder   . Schizo-affective schizophrenia   . Anxiety   . Morbid obesity   . Hypertension   . Hyperlipidemia   . Osteoarthritis   . Arrhythmia     right bundle branch block  . Thyroid disease     hypothyroidism  . Cancer     Past Surgical History  Procedure Laterality Date  . Knee surgery      Left  . Breast surgery    . Hand surgery      Right-trigger finger  . Cholecystectomy    . Tonsillectomy    . Dilation and curettage of uterus      Family History  Problem Relation Age of Onset  . Hypertension Mother     History  Substance Use Topics  . Smoking status: Former Smoker    Quit date: 12/08/1978  . Smokeless tobacco: Never  Used  . Alcohol Use: Yes     Comment: 1 time a year    OB History   Grav Para Term Preterm Abortions TAB SAB Ect Mult Living                  Review of Systems  Constitutional: Negative for fever.  HENT: Positive for neck pain and neck stiffness. Negative for sinus pressure.   Eyes: Negative for photophobia.  Cardiovascular: Negative for syncope.  Gastrointestinal: Negative for nausea, vomiting and abdominal pain.  Neurological: Positive for headaches. Negative for dizziness, focal weakness, seizures, paresthesias and loss of balance.  All other systems reviewed and are negative.    Allergies  Macrodantin; Prozac; Sertraline hcl; Sulfa antibiotics; and Wellbutrin  Home Medications   Current Outpatient Rx  Name  Route  Sig  Dispense  Refill  . acetaminophen (TYLENOL) 500 MG tablet   Oral   Take 1,000 mg by mouth every 6 (six) hours as needed for pain.          Marland Kitchen ALPRAZolam (XANAX) 0.5 MG tablet   Oral   Take 0.5-2 mg by mouth 4 (four) times daily as needed for anxiety. Pt takes 3-4 at bedtime and 1-3 tabs tid prn         . aspirin 325 MG EC tablet   Oral  Take 325 mg by mouth at bedtime.          . calcium-vitamin D (OSCAL WITH D) 500-200 MG-UNIT per tablet   Oral   Take 1 tablet by mouth 2 (two) times daily.         Marland Kitchen donepezil (ARICEPT) 10 MG tablet   Oral   Take 10 mg by mouth at bedtime.          . fenofibrate (TRICOR) 145 MG tablet   Oral   Take 145 mg by mouth every Tuesday, Thursday, and Saturday at 6 PM.         . GARCINIA CAMBOGIA-CHROMIUM PO   Oral   Take 2 capsules by mouth 3 (three) times daily.         Marland Kitchen lamoTRIgine (LAMICTAL) 200 MG tablet   Oral   Take 200 mg by mouth daily.         Marland Kitchen levothyroxine (SYNTHROID, LEVOTHROID) 150 MCG tablet   Oral   Take 150 mcg by mouth daily before breakfast.         . memantine (NAMENDA) 10 MG tablet   Oral   Take 10 mg by mouth 2 (two) times daily.           . metoprolol tartrate  (LOPRESSOR) 25 MG tablet   Oral   Take 25 mg by mouth daily.         . Multiple Vitamin (MULTIVITAMIN WITH MINERALS) TABS   Oral   Take 1 tablet by mouth daily.         . niacin (NIASPAN) 500 MG CR tablet   Oral   Take 500 mg by mouth at bedtime.         Marland Kitchen OLANZapine (ZYPREXA) 15 MG tablet   Oral   Take 30 mg by mouth at bedtime.           Marland Kitchen omega-3 acid ethyl esters (LOVAZA) 1 G capsule   Oral   Take 2 g by mouth daily.         Marland Kitchen omeprazole (PRILOSEC) 20 MG capsule   Oral   Take 1 capsule (20 mg total) by mouth daily.   30 capsule   5   . ramelteon (ROZEREM) 8 MG tablet   Oral   Take 8 mg by mouth at bedtime.           Marland Kitchen spironolactone (ALDACTONE) 25 MG tablet   Oral   Take 1 tablet (25 mg total) by mouth daily.   30 tablet   6     BP 167/59  Pulse 83  Temp(Src) 99 F (37.2 C) (Oral)  Resp 18  Ht 5\' 2"  (1.575 m)  Wt 190 lb (86.183 kg)  BMI 34.74 kg/m2  SpO2 97%  Physical Exam  Nursing note and vitals reviewed. Constitutional: She is oriented to person, place, and time. She appears well-developed and well-nourished. No distress.  HENT:  Head: Normocephalic and atraumatic.  Right Ear: Tympanic membrane and ear canal normal.  Left Ear: Tympanic membrane and ear canal normal.  Mouth/Throat: Oropharynx is clear and moist.  Eyes: Conjunctivae and EOM are normal. Pupils are equal, round, and reactive to light.  Neck: Normal range of motion. Neck supple. Muscular tenderness present. No spinous process tenderness present. Carotid bruit is not present. No Brudzinski's sign and no Kernig's sign noted.  Cardiovascular: Normal rate, regular rhythm and intact distal pulses.   No murmur heard. Pulmonary/Chest: Effort normal and breath sounds normal. No respiratory distress.  She has no wheezes. She has no rales.  Abdominal: Soft. She exhibits no distension. There is no tenderness. There is no rebound and no guarding.  Musculoskeletal: Normal range of motion.  She exhibits no edema and no tenderness.  Neurological: She is alert and oriented to person, place, and time.  Skin: Skin is warm and dry. No rash noted. No erythema.  Psychiatric: She has a normal mood and affect. Her behavior is normal.    ED Course  Procedures (including critical care time)  Labs Reviewed - No data to display No results found.   No diagnosis found.    MDM   Patient here complaining of her intermittent headache for the last 3 weeks out from the back of her head and the top of her neck. She denies any fever or associated symptoms such as nausea, vomiting or focal weakness. She denies any trauma. She does admit to having ongoing neck problems and arthritis and feels that that's most likely the cause for her headache. She is not currently notably hypertensive today and has a normal neuro exam. She denies any sinus symptoms and currently is pain-free. She states the pain usually improves with Tylenol.  CT of the head and neck pending        Gwyneth Sprout, MD 04/28/13 [phone]

## 2013-04-28 NOTE — ED Provider Notes (Signed)
Patient is feeling better.  CT scan without significant abnormality.  She does have significant arthritic changes on her cervical spine film and some of this could be related to her pain today.  She was referred to spine surgery for evaluation.  Also referred her primary care physician Dr. Patty Sermons  The primary encounter diagnosis was Headache. A diagnosis of Neck pain was also pertinent to this visit.  Ct Head Wo Contrast  04/27/2013   *RADIOLOGY REPORT*  Clinical Data:  Neck pain  CT HEAD WITHOUT CONTRAST CT CERVICAL SPINE WITHOUT CONTRAST  Technique:  Multidetector CT imaging of the head and cervical spine was performed following the standard protocol without intravenous contrast.  Multiplanar CT image reconstructions of the cervical spine were also generated.  Comparison:  MRI 07/05/2009  CT HEAD  Findings: Generalized atrophy.  Mild chronic microvascular ischemia in the white matter.  No acute infarct, mass, or hemorrhage.  The calvarium is intact.  IMPRESSION: No acute intracranial abnormality.  CT CERVICAL SPINE  Findings: Mild anterior slip C3-4 and C4-5 related to disc and facet degeneration.  There is moderate spondylosis at C5-6 and C6- 7.  There is diffuse facet degeneration throughout the cervical spine. There is spinal stenosis and foraminal stenosis throughout the cervical spine.  Negative for fracture or mass lesion.  IMPRESSION: Moderate to advanced cervical degenerative changes with spondylosis and diffuse facet hypertrophy.  There is multilevel spinal stenosis, most prominent at C5-6  and  C6-7.  There is significant foraminal stenosis bilaterally at multiple levels, most notably at C3-4 and C4-5 and on the right at C5-6.   Original Report Authenticated By: Janeece Riggers, M.D.   Ct Cervical Spine Wo Contrast  04/27/2013   *RADIOLOGY REPORT*  Clinical Data:  Neck pain  CT HEAD WITHOUT CONTRAST CT CERVICAL SPINE WITHOUT CONTRAST  Technique:  Multidetector CT imaging of the head and cervical  spine was performed following the standard protocol without intravenous contrast.  Multiplanar CT image reconstructions of the cervical spine were also generated.  Comparison:  MRI 07/05/2009  CT HEAD  Findings: Generalized atrophy.  Mild chronic microvascular ischemia in the white matter.  No acute infarct, mass, or hemorrhage.  The calvarium is intact.  IMPRESSION: No acute intracranial abnormality.  CT CERVICAL SPINE  Findings: Mild anterior slip C3-4 and C4-5 related to disc and facet degeneration.  There is moderate spondylosis at C5-6 and C6- 7.  There is diffuse facet degeneration throughout the cervical spine. There is spinal stenosis and foraminal stenosis throughout the cervical spine.  Negative for fracture or mass lesion.  IMPRESSION: Moderate to advanced cervical degenerative changes with spondylosis and diffuse facet hypertrophy.  There is multilevel spinal stenosis, most prominent at C5-6  and  C6-7.  There is significant foraminal stenosis bilaterally at multiple levels, most notably at C3-4 and C4-5 and on the right at C5-6.   Original Report Authenticated By: Janeece Riggers, M.D.    I personally reviewed the imaging tests through PACS system I reviewed available ER/hospitalization records through the EMR   Lyanne Co, MD 04/28/13 0013

## 2013-05-03 ENCOUNTER — Ambulatory Visit: Payer: 59 | Admitting: Cardiology

## 2013-05-11 ENCOUNTER — Other Ambulatory Visit: Payer: Self-pay | Admitting: Physician Assistant

## 2013-05-11 DIAGNOSIS — C50919 Malignant neoplasm of unspecified site of unspecified female breast: Secondary | ICD-10-CM

## 2013-05-12 ENCOUNTER — Other Ambulatory Visit: Payer: Self-pay | Admitting: Oncology

## 2013-05-12 ENCOUNTER — Other Ambulatory Visit (HOSPITAL_BASED_OUTPATIENT_CLINIC_OR_DEPARTMENT_OTHER): Payer: 59 | Admitting: Lab

## 2013-05-12 ENCOUNTER — Ambulatory Visit (HOSPITAL_BASED_OUTPATIENT_CLINIC_OR_DEPARTMENT_OTHER): Payer: 59 | Admitting: Oncology

## 2013-05-12 ENCOUNTER — Telehealth: Payer: Self-pay | Admitting: Oncology

## 2013-05-12 VITALS — BP 123/71 | HR 67 | Temp 98.3°F | Resp 20 | Ht 62.0 in | Wt 191.3 lb

## 2013-05-12 DIAGNOSIS — C50419 Malignant neoplasm of upper-outer quadrant of unspecified female breast: Secondary | ICD-10-CM

## 2013-05-12 DIAGNOSIS — C50919 Malignant neoplasm of unspecified site of unspecified female breast: Secondary | ICD-10-CM

## 2013-05-12 DIAGNOSIS — C50911 Malignant neoplasm of unspecified site of right female breast: Secondary | ICD-10-CM

## 2013-05-12 DIAGNOSIS — Z170 Estrogen receptor positive status [ER+]: Secondary | ICD-10-CM

## 2013-05-12 DIAGNOSIS — R51 Headache: Secondary | ICD-10-CM

## 2013-05-12 LAB — CBC WITH DIFFERENTIAL/PLATELET
Basophils Absolute: 0 10*3/uL (ref 0.0–0.1)
Eosinophils Absolute: 0 10*3/uL (ref 0.0–0.5)
HGB: 12.8 g/dL (ref 11.6–15.9)
MCV: 91.8 fL (ref 79.5–101.0)
MONO#: 0.6 10*3/uL (ref 0.1–0.9)
NEUT#: 3.1 10*3/uL (ref 1.5–6.5)
RBC: 4.17 10*6/uL (ref 3.70–5.45)
RDW: 13.8 % (ref 11.2–14.5)
WBC: 6.1 10*3/uL (ref 3.9–10.3)
lymph#: 2.5 10*3/uL (ref 0.9–3.3)

## 2013-05-12 LAB — COMPREHENSIVE METABOLIC PANEL (CC13)
Albumin: 3.9 g/dL (ref 3.5–5.0)
BUN: 20.7 mg/dL (ref 7.0–26.0)
CO2: 27 mEq/L (ref 22–29)
Calcium: 10 mg/dL (ref 8.4–10.4)
Chloride: 105 mEq/L (ref 98–107)
Glucose: 100 mg/dl — ABNORMAL HIGH (ref 70–99)
Potassium: 4.3 mEq/L (ref 3.5–5.1)
Sodium: 143 mEq/L (ref 136–145)
Total Protein: 7.2 g/dL (ref 6.4–8.3)

## 2013-05-12 NOTE — Progress Notes (Signed)
ID: [name]   DOB: 1941-06-15  MR#: [id number]  WUJ#:[id number]  PCP: Cassell Clement, MD GYN: SU:  OTHER MD: Barbaraann Barthel, Urban Gibson   HISTORY OF PRESENT ILLNESS: [name] had routine screening mammography at the [facility] on September 06, 2009. There was a possible abnormality in the upper outer quadrant of the right breast so she was brought back for diagnostic studies September 12, 2009.  Magnification views confirmed a 1.4 spiculated mass in the upper outer quadrant of the right breast with a few microcalcifications associated with it.  By ultrasound, this measured 1.5 cm and was highly suggestive of malignancy.  Biopsy was performed the same day and showed ([id number] and [phone]) an invasive lobular carcinoma which was 92% ER and 44% PR positive.  The proliferation marker was 13%.  The tumor did not overexpress Her-2 with a ratio of 1.31.    With this information, the patient was referred to Dr. Luisa Hart and after appropriate discussion, she underwent definitive right lumpectomy and axillary sentinel lymph node sampling October 17, 2009.  The final pathology there ([id number]) showed a 1.5 cm invasive lobular carcinoma, grade 1, with negative though closed margins (the in situ component was at 1 mm from the superior, inferior and medial margins) with no evidence of lymphovascular invasion and the single sentinel lymph node clear.  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    INTERVAL HISTORY: [name] returns today for followup of her breast cancer. [place] she has many friends there, she goes out to lunch most days, and she exercises at the gym from 30-60 minutes daily. 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  REVIEW OF SYSTEMS: She was having severe and constant headaches. As part of the workup she had an CT scan of the cervical spine and brain. This showed significant arthritis. She met with Dr. Mikal Plane today and he felt possibly there might  be a connection between all the arthritis and the headaches but certainly no obvious reason, and surgery was not indicated. On the other hand Dr. Tinnie Gens gave her a 12 days of prednisone for her knee pain and that took the headaches away. [name] has not had headaches now for about 10 days. A detailed review of systems was otherwise negative except for problems with balance, and generally she does use a walker but not when she goes out. There have been no falls.  PAST MEDICAL HISTORY: Past Medical History  Diagnosis Date  . Bipolar 1 disorder   . Schizo-affective schizophrenia   . Anxiety   . Morbid obesity   . Hypertension   . Hyperlipidemia   . Osteoarthritis   . Arrhythmia     right bundle branch block  . Thyroid disease     hypothyroidism  . Cancer   1. Dilated cardiomyopathy which the patient tells me does not limit her functional status.   2. History of hypertension.   3. History of bundle branch block. 4. History of a heart murmur. 5. History of osteoarthritis particularly involving the knees. 6. History of hyperlipidemia. 7. History of rosacea. 8. History of hypothyroidism. 9. History of psychiatric disease involving hospitalization in 1999 with a diagnosis of bipolar disorder, chronic schizophrenia and early Alzheimer's disease followed by Dr. Evelene Croon.   10. The patient has a 28 pack-year tobacco abuse history but quit 30 years ago.   11. She is status post bilateral cataract surgery. 12. Status post bilateral tonsillectomy and adenoidectomy. Status post cholecystectomy.  FAMILY HISTORY Family History  Problem Relation Age of Onset  . Hypertension Mother   The patient's mother died from pneumonia at the age of 41 in the setting of schizophrenia.  The patient's father died at the age of 38.  The patient's only sibling was a brother who died from lung cancer in his fifties.  He also had schizophrenia.    GYNECOLOGIC HISTORY: She is GX P0.  She went through the change of  life at the age of 48, took hormone replacement about 2 years.  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  She is now retired.  She has lived in [place] about 30 years.  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  She lives in [place].  She attends [employer].      ADVANCED DIRECTIVES:  HEALTH MAINTENANCE: History  Substance Use Topics  . Smoking status: Former Smoker    Quit date: 12/08/1978  . Smokeless tobacco: Never Used  . Alcohol Use: Yes     Comment: 1 time a year     Colonoscopy:  PAP:  Bone density:  Lipid panel:  Allergies  Allergen Reactions  . Macrodantin   . Prozac (Fluoxetine Hcl)   . Sertraline Hcl   . Sulfa Antibiotics   . Wellbutrin (Bupropion Hcl)     Current Outpatient Prescriptions  Medication Sig Dispense Refill  . acetaminophen (TYLENOL) 500 MG tablet Take 1,000 mg by mouth every 6 (six) hours as needed for pain.       Marland Kitchen ALPRAZolam (XANAX) 0.5 MG tablet Take 0.5-2 mg by mouth 4 (four) times daily as needed for anxiety. Pt takes 3-4 at bedtime and 1-3 tabs tid prn      . aspirin 325 MG EC tablet Take 325 mg by mouth at bedtime.       . calcium-vitamin D (OSCAL WITH D) 500-200 MG-UNIT per tablet Take 1 tablet by mouth 2 (two) times daily.      Marland Kitchen donepezil (ARICEPT) 10 MG tablet Take 10 mg by mouth at bedtime.       . fenofibrate (TRICOR) 145 MG tablet Take 145 mg by mouth every Tuesday, Thursday, and Saturday at 6 PM.      . GARCINIA CAMBOGIA-CHROMIUM PO Take 2 capsules by mouth 3 (three) times daily.      Marland Kitchen HYDROcodone-acetaminophen (NORCO/VICODIN) 5-325 MG per tablet Take 1 tablet by mouth every 6 (six) hours as needed for pain.  8 tablet  0  . lamoTRIgine (LAMICTAL) 200 MG tablet Take 200 mg by mouth daily.      Marland Kitchen levothyroxine (SYNTHROID, LEVOTHROID) 150 MCG tablet  Take 150 mcg by mouth daily before breakfast.      . memantine (NAMENDA) 10 MG tablet Take 10 mg by mouth 2 (two) times daily.        . metoprolol tartrate (LOPRESSOR) 25 MG tablet Take 25 mg by mouth daily.      . Multiple Vitamin (MULTIVITAMIN WITH MINERALS) TABS Take 1 tablet by mouth daily.      . niacin (NIASPAN) 500 MG CR tablet Take 500 mg by mouth at bedtime.      Marland Kitchen OLANZapine (ZYPREXA) 15 MG tablet Take 30 mg by mouth at bedtime.        Marland Kitchen omega-3 acid ethyl esters (LOVAZA) 1 G capsule  Take 2 g by mouth daily.      Marland Kitchen omeprazole (PRILOSEC) 20 MG capsule Take 1 capsule (20 mg total) by mouth daily.  30 capsule  5  . ramelteon (ROZEREM) 8 MG tablet Take 8 mg by mouth at bedtime.        Marland Kitchen spironolactone (ALDACTONE) 25 MG tablet Take 1 tablet (25 mg total) by mouth daily.  30 tablet  6   No current facility-administered medications for this visit.    OBJECTIVE: Elderly white woman in no acute distress Filed Vitals:   05/12/13 1506  BP: 123/71  Pulse: 67  Temp: 98.3 F (36.8 C)  Resp: 20     Body mass index is 34.98 kg/(m^2).    ECOG FS: 2  Sclerae unicteric Oropharynx clear No peripheral adenopathy Lungs no rales or rhonchi Heart regular rate and rhythm Abd obese, benign MSK kyphosis but no focal spinal tenderness Neuro: nonfocal Breasts: The right breast is status post lumpectomy and radiation. There is no evidence of local recurrence. The right axilla is benign. Left breast is unremarkable  LAB RESULTS: Lab Results  Component Value Date   WBC 6.1 05/12/2013   NEUTROABS 3.1 05/12/2013   HGB 12.8 05/12/2013   HCT 38.3 05/12/2013   MCV 91.8 05/12/2013   PLT 230 05/12/2013      Chemistry      Component Value Date/Time   NA 143 05/12/2013 1452   NA 140 02/23/2013 0905   K 4.3 05/12/2013 1452   K 4.2 02/23/2013 0905   CL 105 05/12/2013 1452   CL 104 02/23/2013 0905   CO2 27 05/12/2013 1452   CO2 27 02/23/2013 0905   BUN 20.7 05/12/2013 1452   BUN 17 02/23/2013 0905   CREATININE 1.1  05/12/2013 1452   CREATININE 1.0 02/23/2013 0905      Component Value Date/Time   CALCIUM 10.0 05/12/2013 1452   CALCIUM 10.0 02/23/2013 0905   ALKPHOS 41 05/12/2013 1452   ALKPHOS 39 02/23/2013 0905   AST 29 05/12/2013 1452   AST 30 02/23/2013 0905   ALT 35 05/12/2013 1452   ALT 34 02/23/2013 0905   BILITOT 0.31 05/12/2013 1452   BILITOT 0.5 02/23/2013 0905       Lab Results  Component Value Date   LABCA2 12 04/01/2012    No components found with this basename: [id number]    No results found for this basename: INR,  in the last 168 hours  Urinalysis No results found for this basename: colorurine,  appearanceur,  labspec,  phurine,  glucoseu,  hgbur,  bilirubinur,  ketonesur,  proteinur,  urobilinogen,  nitrite,  leukocytesur    STUDIES: Mammography at [facility] March 03/16/2013 r was unremarkable.  ASSESSMENT: 72 y.o.  [place] woman status post right lumpectomy and sentinel lymph node sampling in November 2010 for a T1cN0, stage IA invasive lobular carcinoma, grade 1, strongly estrogen and progesterone receptor positive, HER-2/neu negative, with low MIB-1.    (1) Status post radiation therapy, completed in February 2011.  (2) Did not tolerate aromatase inhibitors and is not a candidate for tamoxifen. Followed with observation alone.  PLAN: Hydie is doing very well from a breast cancer point of view and we are going to see her one more time, a year from now. At that point she will "graduate" from followup here. We talked about her headaches. In fact a day got better with steroids could lead in any number of directions but 1 simple 1 may be that she  was having sinus problems and I have advised her next time she gets a headache to start loratadine and see if after a few days things don't improve. Otherwise she knows to call for any problems that may develop before her next visit here.  MAGRINAT,GUSTAV C    05/12/2013

## 2013-05-12 NOTE — Telephone Encounter (Signed)
, °

## 2013-05-30 ENCOUNTER — Other Ambulatory Visit: Payer: Self-pay | Admitting: Cardiology

## 2013-06-13 ENCOUNTER — Other Ambulatory Visit: Payer: Self-pay | Admitting: Cardiology

## 2013-06-30 ENCOUNTER — Ambulatory Visit (INDEPENDENT_AMBULATORY_CARE_PROVIDER_SITE_OTHER): Payer: Medicare PPO | Admitting: Cardiology

## 2013-06-30 ENCOUNTER — Encounter: Payer: Self-pay | Admitting: Cardiology

## 2013-06-30 VITALS — BP 128/78 | HR 72 | Ht 62.5 in | Wt 189.8 lb

## 2013-06-30 DIAGNOSIS — I4510 Unspecified right bundle-branch block: Secondary | ICD-10-CM

## 2013-06-30 DIAGNOSIS — E7800 Pure hypercholesterolemia, unspecified: Secondary | ICD-10-CM

## 2013-06-30 DIAGNOSIS — K582 Mixed irritable bowel syndrome: Secondary | ICD-10-CM

## 2013-06-30 DIAGNOSIS — I119 Hypertensive heart disease without heart failure: Secondary | ICD-10-CM

## 2013-06-30 DIAGNOSIS — E039 Hypothyroidism, unspecified: Secondary | ICD-10-CM

## 2013-06-30 DIAGNOSIS — K589 Irritable bowel syndrome without diarrhea: Secondary | ICD-10-CM

## 2013-06-30 NOTE — Assessment & Plan Note (Signed)
I have advised her to try Metamucil to help with the diarrhea related to her IBS

## 2013-06-30 NOTE — Patient Instructions (Signed)
Your physician wants you to follow-up in: 4 months with fasting labs (lp/bmet/hfp/tsh)  You will receive a reminder letter in the mail two months in advance. If you don't receive a letter, please call our office to schedule the follow-up appointment.  STOP NIASPAN

## 2013-06-30 NOTE — Assessment & Plan Note (Signed)
Patient has a history of dyslipidemia.  She is on fenofibrate and lovaza.  We are stopping her Niaspan today.  We did not draw labs this time.  We will plan to get fasting blood work at her next visit

## 2013-06-30 NOTE — Progress Notes (Signed)
[name] Date of Birth:  05-Dec-1941 [facility] [address] [place], [place]  [address] [phone]  Fax   [phone]  HPI: This pleasant 72 year old woman is seen for a scheduled four-month followup office visit. She has a past history of essential hypertension and dyslipidemia.  She has a past history of breast cancer followed by Dr. Arlice Colt. She also has a complex psychiatric diagnosis including OCD, bipolar syndrome, borderline schizophrenia, and early Alzheimer's. Since last visit she's had no new cardiac symptoms. She has a past history of right bundle branch block. She has had previous nuclear stress test showing no evidence of ischemia. Since last visit the patient has been feeling well.  She has had some recent problems with irritable bowel syndrome with urgency with defecation at times.   Current Outpatient Prescriptions  Medication Sig Dispense Refill  . ALPRAZolam (XANAX) 0.5 MG tablet Take 0.5-2 mg by mouth 4 (four) times daily as needed for anxiety. Pt takes 3-4 at bedtime and 1-3 tabs tid prn      . aspirin 325 MG EC tablet Take 325 mg by mouth at bedtime.       . calcium-vitamin D (OSCAL WITH D) 500-200 MG-UNIT per tablet Take 1 tablet by mouth 2 (two) times daily.      Marland Kitchen donepezil (ARICEPT) 10 MG tablet Take 10 mg by mouth at bedtime.       . fenofibrate (TRICOR) 145 MG tablet Take 145 mg by mouth every Tuesday, Thursday, and Saturday at 6 PM.      . GARCINIA CAMBOGIA-CHROMIUM PO Take 2 capsules by mouth 3 (three) times daily.      Marland Kitchen lamoTRIgine (LAMICTAL) 200 MG tablet Take 200 mg by mouth daily.      Marland Kitchen levothyroxine (SYNTHROID, LEVOTHROID) 150 MCG tablet Take 150 mcg by mouth daily before breakfast.      . memantine (NAMENDA) 10 MG tablet Take 10 mg by mouth 2 (two) times daily.        . metoprolol tartrate (LOPRESSOR) 25 MG tablet Take 25 mg by mouth daily.      . metoprolol tartrate (LOPRESSOR) 25 MG tablet TAKE 1 TABLET (25 MG TOTAL) BY  MOUTH DAILY.  30 tablet  11  . Multiple Vitamin (MULTIVITAMIN WITH MINERALS) TABS Take 1 tablet by mouth daily.      . naproxen sodium (ANAPROX) 220 MG tablet Take 220 mg by mouth daily.      Marland Kitchen OLANZapine (ZYPREXA) 15 MG tablet Take 30 mg by mouth at bedtime.        Marland Kitchen omega-3 acid ethyl esters (LOVAZA) 1 G capsule Take 2 g by mouth daily.      Marland Kitchen omeprazole (PRILOSEC) 20 MG capsule Take 1 capsule (20 mg total) by mouth daily.  30 capsule  5  . ramelteon (ROZEREM) 8 MG tablet Take 8 mg by mouth at bedtime.        Marland Kitchen spironolactone (ALDACTONE) 25 MG tablet TAKE 1 TABLET (25 MG TOTAL) BY MOUTH DAILY.  30 tablet  6   No current facility-administered medications for this visit.    Allergies  Allergen Reactions  . Macrodantin   . Prozac (Fluoxetine Hcl)   . Sertraline Hcl   . Sulfa Antibiotics   . Wellbutrin (Bupropion Hcl)     Patient Active Problem List   Diagnosis Date Noted  . Right bundle branch block 02/23/2013  . Irritable bowel syndrome with constipation and diarrhea 10/28/2012  . Breast cancer 04/07/2012  .  Dermatitis 12/24/2011  . Hypercholesterolemia 04/30/2011  . Hypothyroidism 04/30/2011  . Dementia 04/30/2011  . Benign hypertensive heart disease without heart failure 04/30/2011    History  Smoking status  . Former Smoker  . Quit date: 12/08/1978  Smokeless tobacco  . Never Used    History  Alcohol Use  . Yes    Comment: 1 time a year    Family History  Problem Relation Age of Onset  . Hypertension Mother     Review of Systems: The patient denies any heat or cold intolerance.  No weight gain or weight loss.  The patient denies headaches or blurry vision.  There is no cough or sputum production.  The patient denies dizziness.  There is no hematuria or hematochezia.  The patient denies any muscle aches or arthritis.  The patient denies any rash.  The patient denies frequent falling or instability.  There is no history of depression or anxiety.  All other  systems were reviewed and are negative.   Physical Exam: Filed Vitals:   06/30/13 1156  BP: 128/78  Pulse: 72   the general appearance reveals a well-developed middle-aged woman in no distress.The head and neck exam reveals pupils equal and reactive.  Extraocular movements are full.  There is no scleral icterus.  The mouth and pharynx are normal.  The neck is supple.  The carotids reveal no bruits.  The jugular venous pressure is normal.  The  thyroid is not enlarged.  There is no lymphadenopathy.  The chest is clear to percussion and auscultation.  There are no rales or rhonchi.  Expansion of the chest is symmetrical.  The precordium is quiet.  The first heart sound is normal.  The second heart sound is physiologically split.  There is no murmur gallop rub or click.  There is no abnormal lift or heave.  The abdomen is soft and nontender.  The bowel sounds are normal.  The liver and spleen are not enlarged.  There are no abdominal masses.  There are no abdominal bruits.  Extremities reveal good pedal pulses.  There is no phlebitis or edema.  There is no cyanosis or clubbing.  Strength is normal and symmetrical in all extremities.  There is no lateralizing weakness.  There are no sensory deficits.  The skin is warm and dry.  There is no rash.  She has a small ecchymosis of the left fourth toe after tripping and injuring her toe last evening.  She may have a hairline fracture but the toe is not very sensitive to movement.      Assessment / Plan: Continue on same medication.  Stop Niaspan.  Recheck in 4 months for office visit TSH lipid panel hepatic function panel and basal metabolic panel.  Continue weight loss.

## 2013-06-30 NOTE — Assessment & Plan Note (Signed)
Blood pressure was remaining stable on current therapy.  She's not having any dizziness or syncope.

## 2013-08-15 ENCOUNTER — Other Ambulatory Visit: Payer: Self-pay | Admitting: Cardiology

## 2013-10-20 ENCOUNTER — Other Ambulatory Visit: Payer: Self-pay | Admitting: Cardiology

## 2013-11-02 ENCOUNTER — Ambulatory Visit (INDEPENDENT_AMBULATORY_CARE_PROVIDER_SITE_OTHER): Payer: Medicare PPO | Admitting: Cardiology

## 2013-11-02 ENCOUNTER — Other Ambulatory Visit: Payer: Commercial Managed Care - HMO

## 2013-11-02 ENCOUNTER — Encounter: Payer: Self-pay | Admitting: Cardiology

## 2013-11-02 VITALS — BP 122/60 | HR 65 | Ht 62.5 in | Wt 186.8 lb

## 2013-11-02 DIAGNOSIS — R059 Cough, unspecified: Secondary | ICD-10-CM

## 2013-11-02 DIAGNOSIS — R05 Cough: Secondary | ICD-10-CM | POA: Insufficient documentation

## 2013-11-02 DIAGNOSIS — I119 Hypertensive heart disease without heart failure: Secondary | ICD-10-CM

## 2013-11-02 DIAGNOSIS — E039 Hypothyroidism, unspecified: Secondary | ICD-10-CM

## 2013-11-02 DIAGNOSIS — E7800 Pure hypercholesterolemia, unspecified: Secondary | ICD-10-CM

## 2013-11-02 LAB — BASIC METABOLIC PANEL
BUN: 18 mg/dL (ref 6–23)
Calcium: 9.7 mg/dL (ref 8.4–10.5)
GFR: 54.52 mL/min — ABNORMAL LOW (ref >60.00)
Glucose, Bld: 98 mg/dL (ref 70–99)
Potassium: 4 mEq/L (ref 3.5–5.1)
Sodium: 138 mEq/L (ref 135–145)

## 2013-11-02 LAB — HEPATIC FUNCTION PANEL
ALT: 31 U/L (ref 0–35)
AST: 24 U/L (ref 0–37)
Total Bilirubin: 0.5 mg/dL (ref 0.3–1.2)
Total Protein: 7.2 g/dL (ref 6.0–8.3)

## 2013-11-02 LAB — TSH: TSH: 0.51 u[IU]/mL (ref 0.35–5.50)

## 2013-11-02 LAB — LIPID PANEL
HDL: 42.5 mg/dL (ref >39.00)
Triglycerides: 203 mg/dL — ABNORMAL HIGH (ref 0.0–149.0)

## 2013-11-02 NOTE — Progress Notes (Signed)
Quick Note:  Please report to patient. The recent labs are stable. Continue same medication and careful diet. Thyroid okay. LDL better. CSD ______

## 2013-11-02 NOTE — Patient Instructions (Signed)
Your physician wants you to follow-up with Dr Patty Sermons & get fasting lab work in 4 months.  (lipid,liver,Bmet)  Chest x-ray to be done at [facility]  Your physician recommends that you continue on your current medications as directed. Please refer to the Current Medication list given to you today.

## 2013-11-02 NOTE — Assessment & Plan Note (Signed)
The patient is not having any sputum production.  He has a dry cough.  We will check chest x-ray.

## 2013-11-02 NOTE — Progress Notes (Signed)
[name] Date of Birth:  01-23-1941 [address] [place], [place]  [address] [phone]  Fax   [phone]  HPI: This pleasant 72 year old woman is seen for a scheduled four-month followup office visit. She has a past history of essential hypertension and dyslipidemia.  She has a past history of breast cancer followed by Dr. Arlice Colt. She also has a complex psychiatric diagnosis including OCD, bipolar syndrome, borderline schizophrenia, and early Alzheimer's. Since last visit she's had no new cardiac symptoms. She has a past history of right bundle branch block. She has had previous nuclear stress test showing no evidence of ischemia. Since last visit the patient has been feeling well.  She is exposed to secondhand smoke from her boyfriend.  She has developed a cough and is requesting a chest x-ray.  Her last full chest x-ray was in 2010.   Current Outpatient Prescriptions  Medication Sig Dispense Refill  . ALPRAZolam (XANAX) 0.5 MG tablet Take 0.5-2 mg by mouth 4 (four) times daily as needed for anxiety. Pt takes 3-4 at bedtime and 1-3 tabs tid prn      . aspirin 325 MG EC tablet Take 325 mg by mouth at bedtime.       . donepezil (ARICEPT) 10 MG tablet Take 10 mg by mouth at bedtime.       . fenofibrate (TRICOR) 145 MG tablet Take 145 mg by mouth every Tuesday, Thursday, and Saturday at 6 PM.      . GARCINIA CAMBOGIA-CHROMIUM PO Take 2 capsules by mouth 3 (three) times daily.      Marland Kitchen lamoTRIgine (LAMICTAL) 200 MG tablet Take 200 mg by mouth daily.      Marland Kitchen levothyroxine (SYNTHROID, LEVOTHROID) 150 MCG tablet Take 150 mcg by mouth daily before breakfast.      . memantine (NAMENDA) 10 MG tablet Take 10 mg by mouth 2 (two) times daily.        . metoprolol tartrate (LOPRESSOR) 25 MG tablet Take 25 mg by mouth daily.      . metoprolol tartrate (LOPRESSOR) 25 MG tablet TAKE 1 TABLET (25 MG TOTAL) BY MOUTH DAILY.  30 tablet  11  . Multiple Vitamin (MULTIVITAMIN WITH MINERALS)  TABS Take 1 tablet by mouth daily.      . naproxen sodium (ANAPROX) 220 MG tablet Take 220 mg by mouth daily.      Marland Kitchen OLANZapine (ZYPREXA) 15 MG tablet Take 30 mg by mouth at bedtime.        Marland Kitchen omeprazole (PRILOSEC) 20 MG capsule TAKE 1 CAPSULE (20 MG TOTAL) BY MOUTH DAILY.  30 capsule  0  . QUEtiapine (SEROQUEL) 25 MG tablet Take 25 mg by mouth at bedtime.      . ramelteon (ROZEREM) 8 MG tablet Take 8 mg by mouth at bedtime.        Marland Kitchen spironolactone (ALDACTONE) 25 MG tablet TAKE 1 TABLET (25 MG TOTAL) BY MOUTH DAILY.  30 tablet  6  . calcium-vitamin D (OSCAL WITH D) 500-200 MG-UNIT per tablet Take 1 tablet by mouth 2 (two) times daily.      Marland Kitchen omega-3 acid ethyl esters (LOVAZA) 1 G capsule Take 2 g by mouth daily.       No current facility-administered medications for this visit.    Allergies  Allergen Reactions  . Macrodantin   . Prozac [Fluoxetine Hcl]   . Sertraline Hcl   . Sulfa Antibiotics   . Wellbutrin [Bupropion Hcl]  Patient Active Problem List   Diagnosis Date Noted  . Cough 11/02/2013  . Right bundle branch block 02/23/2013  . Irritable bowel syndrome with constipation and diarrhea 10/28/2012  . Breast cancer 04/07/2012  . Dermatitis 12/24/2011  . Hypercholesterolemia 04/30/2011  . Hypothyroidism 04/30/2011  . Dementia 04/30/2011  . Benign hypertensive heart disease without heart failure 04/30/2011    History  Smoking status  . Former Smoker  . Quit date: 12/08/1978  Smokeless tobacco  . Never Used    History  Alcohol Use  . Yes    Comment: 1 time a year    Family History  Problem Relation Age of Onset  . Hypertension Mother     Review of Systems: The patient denies any heat or cold intolerance.  No weight gain or weight loss.  The patient denies headaches or blurry vision.  There is no cough or sputum production.  The patient denies dizziness.  There is no hematuria or hematochezia.  The patient denies any muscle aches or arthritis.  The patient  denies any rash.  The patient denies frequent falling or instability.  There is no history of depression or anxiety.  All other systems were reviewed and are negative.   Physical Exam: Filed Vitals:   11/02/13 1108  BP: 122/60  Pulse: 65   the general appearance reveals a well-developed middle-aged woman in no distress.The head and neck exam reveals pupils equal and reactive.  Extraocular movements are full.  There is no scleral icterus.  The mouth and pharynx are normal.  The neck is supple.  The carotids reveal no bruits.  The jugular venous pressure is normal.  The  thyroid is not enlarged.  There is no lymphadenopathy.  The chest is clear to percussion and auscultation.  There are no rales or rhonchi.  Expansion of the chest is symmetrical.  The precordium is quiet.  The first heart sound is normal.  The second heart sound is physiologically split.  There is no murmur gallop rub or click.  There is no abnormal lift or heave.  The abdomen is soft and nontender.  The bowel sounds are normal.  The liver and spleen are not enlarged.  There are no abdominal masses.  There are no abdominal bruits.  Extremities reveal good pedal pulses.  There is no phlebitis or edema.  There is no cyanosis or clubbing.  Strength is normal and symmetrical in all extremities.  There is no lateralizing weakness.  There are no sensory deficits.  The skin is warm and dry.  There is no rash.  She has a small ecchymosis of the left fourth toe after tripping and injuring her toe last evening.  She may have a hairline fracture but the toe is not very sensitive to movement.      Assessment / Plan: Continue on same medication.   Recheck in 4 months for office visit  lipid panel hepatic function panel and basal metabolic panel.  Continue weight loss.  Try to avoid secondhand smoke as much as possible.  We will order a chest x-ray.

## 2013-11-02 NOTE — Assessment & Plan Note (Signed)
The patient has a history of hypothyroidism and is on thyroid replacement therapy.  We are checking a TSH today.  She is on Synthroid 150 mcg daily before breakfast.

## 2013-11-02 NOTE — Assessment & Plan Note (Signed)
Blood pressure was remaining stable on current therapy.  No dizziness or syncope.  No chest pain 

## 2013-11-11 ENCOUNTER — Ambulatory Visit
Admission: RE | Admit: 2013-11-11 | Discharge: 2013-11-11 | Disposition: A | Discharge status: Home / Self Care | Payer: Commercial Managed Care - HMO | Source: Ambulatory Visit | Attending: Cardiology | Admitting: Cardiology

## 2013-11-11 DIAGNOSIS — E7800 Pure hypercholesterolemia, unspecified: Secondary | ICD-10-CM

## 2013-11-11 DIAGNOSIS — R05 Cough: Secondary | ICD-10-CM

## 2013-11-11 DIAGNOSIS — R059 Cough, unspecified: Secondary | ICD-10-CM

## 2013-11-11 DIAGNOSIS — I119 Hypertensive heart disease without heart failure: Secondary | ICD-10-CM

## 2013-11-11 DIAGNOSIS — E039 Hypothyroidism, unspecified: Secondary | ICD-10-CM

## 2013-11-17 ENCOUNTER — Other Ambulatory Visit: Payer: Self-pay | Admitting: Cardiology

## 2013-12-19 ENCOUNTER — Other Ambulatory Visit: Payer: Self-pay | Admitting: Cardiology

## 2013-12-26 ENCOUNTER — Other Ambulatory Visit: Payer: Self-pay | Admitting: Cardiology

## 2013-12-27 ENCOUNTER — Other Ambulatory Visit: Payer: Self-pay | Admitting: Cardiology

## 2014-01-04 ENCOUNTER — Other Ambulatory Visit: Payer: Self-pay | Admitting: Cardiology

## 2014-01-09 ENCOUNTER — Other Ambulatory Visit: Payer: Self-pay | Admitting: Cardiology

## 2014-02-13 ENCOUNTER — Other Ambulatory Visit: Payer: Self-pay | Admitting: Cardiology

## 2014-02-15 ENCOUNTER — Ambulatory Visit: Payer: Medicare PPO | Admitting: Cardiology

## 2014-02-28 ENCOUNTER — Telehealth: Payer: Self-pay | Admitting: Cardiology

## 2014-02-28 NOTE — Telephone Encounter (Signed)
Patient actually saw another MD since I left message and they recommended Aleve. Advised ok to take but if back pain no better in few days to contact Dr Gladstone Lighter

## 2014-02-28 NOTE — Telephone Encounter (Signed)
Left message to call back  

## 2014-02-28 NOTE — Telephone Encounter (Signed)
Agree with advice given

## 2014-02-28 NOTE — Telephone Encounter (Signed)
New message  Patient is having back pain. She doesn't want a RX for it. But wants to know what the strongest and safest pain medication OTC is to take. Please call and advise.

## 2014-03-13 ENCOUNTER — Other Ambulatory Visit: Payer: Self-pay | Admitting: Cardiology

## 2014-03-22 ENCOUNTER — Other Ambulatory Visit: Payer: Self-pay | Admitting: Cardiology

## 2014-03-31 ENCOUNTER — Other Ambulatory Visit: Payer: Self-pay | Admitting: Cardiology

## 2014-05-01 ENCOUNTER — Other Ambulatory Visit: Payer: Self-pay | Admitting: Cardiology

## 2014-05-03 ENCOUNTER — Telehealth: Payer: Self-pay | Admitting: Cardiology

## 2014-05-03 NOTE — Telephone Encounter (Signed)
New problem   Pt need to speak to you, but wouldn't say why. She will be in her apartment for the next hour.

## 2014-05-03 NOTE — Telephone Encounter (Signed)
Patient woke up early Monday morning with discomfort right side of chest about 3 inches above nipple to under right arm, hurt enough to wake her up. Took two Tylenol and went back to bed, rested well. When she got up that morning was very sore, applied heating pad with relief for rest of the day. Tuesday morning she woke up with returned pain, applied heating pad with relief. This am pain there again but not as bad. Patient states pain feels musculoskeletal, not like a muscle spasm. Advised patient to continue with current regimen and if no improvement/worse to call back.

## 2014-05-03 NOTE — Telephone Encounter (Signed)
Agree with advice given

## 2014-05-08 ENCOUNTER — Telehealth: Payer: Self-pay | Admitting: Cardiology

## 2014-05-08 ENCOUNTER — Other Ambulatory Visit: Payer: Self-pay | Admitting: Cardiology

## 2014-05-08 NOTE — Telephone Encounter (Signed)
Spoke with patient and pain is relieved with heat but continues to come back. She would like to be seen today but  Dr. Mare Ferrari out of the office. Worked in patient for tomorrow.

## 2014-05-08 NOTE — Telephone Encounter (Signed)
New message     C/o right side chest pain.

## 2014-05-09 ENCOUNTER — Encounter: Payer: Self-pay | Admitting: Cardiology

## 2014-05-09 ENCOUNTER — Ambulatory Visit (INDEPENDENT_AMBULATORY_CARE_PROVIDER_SITE_OTHER): Payer: Commercial Managed Care - HMO | Admitting: Cardiology

## 2014-05-09 VITALS — BP 142/64 | HR 67 | Ht 62.5 in | Wt 187.0 lb

## 2014-05-09 DIAGNOSIS — I119 Hypertensive heart disease without heart failure: Secondary | ICD-10-CM

## 2014-05-09 DIAGNOSIS — E7800 Pure hypercholesterolemia, unspecified: Secondary | ICD-10-CM

## 2014-05-09 DIAGNOSIS — I4510 Unspecified right bundle-branch block: Secondary | ICD-10-CM

## 2014-05-09 DIAGNOSIS — C50919 Malignant neoplasm of unspecified site of unspecified female breast: Secondary | ICD-10-CM

## 2014-05-09 DIAGNOSIS — R079 Chest pain, unspecified: Secondary | ICD-10-CM

## 2014-05-09 NOTE — Progress Notes (Signed)
[name] Date of Birth:  03/12/1941 [place] [address] [place], [place]  [address] [phone]        Fax   [phone]   History of Present Illness: This pleasant 73 year old woman is seen for a work in office visit.  She comes in because of some intermittent right precordial chest discomfort which has been intermittent for the past week.  She does not have any history of known ischemic heart disease.  Her last Myoview was in 2008 and was normal at that time.  She has a past history of essential hypertension and dyslipidemia. She has a past history of breast cancer followed by Dr. Griffith Citron. She also has a complex psychiatric diagnosis including OCD, bipolar syndrome, borderline schizophrenia, and early Alzheimer's. Since last visit she's had no new cardiac symptoms. She has a past history of right bundle branch block.  She had a recent chest x-ray which was unremarkable.  Current Outpatient Prescriptions  Medication Sig Dispense Refill  . ALPRAZolam (XANAX) 0.5 MG tablet Take 0.5-2 mg by mouth 4 (four) times daily as needed for anxiety. Pt takes 3-4 at bedtime and 1-3 tabs tid prn      . aspirin 81 MG tablet Take 81 mg by mouth daily.      . calcium-vitamin D (OSCAL WITH D) 500-200 MG-UNIT per tablet Take 1 tablet by mouth 2 (two) times daily.      Marland Kitchen donepezil (ARICEPT) 10 MG tablet Take 10 mg by mouth at bedtime.       . fenofibrate (TRICOR) 145 MG tablet Take 145 mg by mouth every Tuesday, Thursday, and Saturday at 6 PM.      . fenofibrate (TRICOR) 145 MG tablet TAKE 1 TABLET (145 MG TOTAL) BY MOUTH AS DIRECTED. 1 TABLET EVERY OTHER DAY  30 tablet  0  . GARCINIA CAMBOGIA-CHROMIUM PO Take 2 capsules by mouth 3 (three) times daily.      Marland Kitchen ibuprofen (ADVIL,MOTRIN) 200 MG tablet Take 200 mg by mouth 3 (three) times daily as needed.      . lamoTRIgine (LAMICTAL) 200 MG tablet Take 200 mg by mouth daily.      Marland Kitchen levothyroxine (SYNTHROID, LEVOTHROID) 150 MCG  tablet Take 150 mcg by mouth daily before breakfast.      . memantine (NAMENDA) 10 MG tablet Take 10 mg by mouth 2 (two) times daily.        . metoprolol tartrate (LOPRESSOR) 25 MG tablet Take 25 mg by mouth daily.      . metoprolol tartrate (LOPRESSOR) 25 MG tablet TAKE 1 TABLET (25 MG TOTAL) BY MOUTH DAILY.  30 tablet  11  . Multiple Vitamin (MULTIVITAMIN WITH MINERALS) TABS Take 1 tablet by mouth daily.      . naproxen sodium (ANAPROX) 220 MG tablet Take 220 mg by mouth daily.      Marland Kitchen OLANZapine (ZYPREXA) 15 MG tablet Take 30 mg by mouth at bedtime.        Marland Kitchen omega-3 acid ethyl esters (LOVAZA) 1 G capsule Take 2 g by mouth daily.      Marland Kitchen omeprazole (PRILOSEC) 20 MG capsule TAKE 1 CAPSULE (20 MG TOTAL) BY MOUTH DAILY.  30 capsule  1  . QUEtiapine (SEROQUEL) 25 MG tablet Take 25 mg by mouth at bedtime.      . ramelteon (ROZEREM) 8 MG tablet Take 8 mg by mouth at bedtime.        Marland Kitchen spironolactone (ALDACTONE) 25 MG  tablet TAKE 1 TABLET (25 MG TOTAL) BY MOUTH DAILY.  30 tablet  1  . SYNTHROID 150 MCG tablet TAKE 1 TABLET (150 MCG TOTAL) BY MOUTH DAILY.  30 tablet  1   No current facility-administered medications for this visit.    Allergies  Allergen Reactions  . Macrodantin   . Prozac [Fluoxetine Hcl]   . Sertraline Hcl   . Sulfa Antibiotics   . Wellbutrin [Bupropion Hcl]     Patient Active Problem List   Diagnosis Date Noted  . Chest pain 05/09/2014  . Cough 11/02/2013  . Right bundle branch block 02/23/2013  . Irritable bowel syndrome with constipation and diarrhea 10/28/2012  . Breast cancer 04/07/2012  . Dermatitis 12/24/2011  . Hypercholesterolemia 04/30/2011  . Hypothyroidism 04/30/2011  . Dementia 04/30/2011  . Benign hypertensive heart disease without heart failure 04/30/2011    History  Smoking status  . Former Smoker  . Quit date: 12/08/1978  Smokeless tobacco  . Never Used    History  Alcohol Use  . Yes    Comment: 1 time a year    Family History  Problem  Relation Age of Onset  . Hypertension Mother     Review of Systems: Constitutional: no fever chills diaphoresis or fatigue or change in weight.  Head and neck: no hearing loss, no epistaxis, no photophobia or visual disturbance. Respiratory: No cough, shortness of breath or wheezing. Cardiovascular: No chest pain peripheral edema, palpitations. Gastrointestinal: No abdominal distention, no abdominal pain, no change in bowel habits hematochezia or melena. Genitourinary: No dysuria, no frequency, no urgency, no nocturia. Musculoskeletal:No arthralgias, no back pain, no gait disturbance or myalgias. Neurological: No dizziness, no headaches, no numbness, no seizures, no syncope, no weakness, no tremors. Hematologic: No lymphadenopathy, no easy bruising. Psychiatric: No confusion, no hallucinations, no sleep disturbance.    Physical Exam: Filed Vitals:   05/09/14 1102  BP: 142/64  Pulse: 67   general appearance reveals a well-developed well-nourished woman in no distress.The head and neck exam reveals pupils equal and reactive.  Extraocular movements are full.  There is no scleral icterus.  The mouth and pharynx are normal.  The neck is supple.  The carotids reveal no bruits.  The jugular venous pressure is normal.  The  thyroid is not enlarged.  There is no lymphadenopathy.  The chest is clear to percussion and auscultation.  There are no rales or rhonchi.  Expansion of the chest is symmetrical.  The precordium is quiet.  She is mildly tender across the right anterior chest. The first heart sound is normal.  The second heart sound is physiologically split.  There is no murmur gallop rub or click.  There is no abnormal lift or heave.  The abdomen is soft and nontender.  The bowel sounds are normal.  The liver and spleen are not enlarged.  There are no abdominal masses.  There are no abdominal bruits.  Extremities reveal good pedal pulses.  There is no phlebitis or edema.  There is no cyanosis or  clubbing.  Strength is normal and symmetrical in all extremities.  There is no lateralizing weakness.  There are no sensory deficits.  The skin is warm and dry.  There is no rash.  EKG shows normal sinus rhythm, right bundle branch block widespread inferolateral T wave inversion unchanged from 06/30/13   Assessment / Plan: 1. chest pain with typical and atypical features. 2. Hypercholesterolemia 3. old right bundle branch block 4.  Hypothyroidism 5. complex psychiatric  disorder comprised of OCD, bipolar syndrome, borderline schizophrenia, and early Alzheimer's.  Plan: She will try some ibuprofen for her right-sided chest discomfort.  She will drop down on her aspirin to just 81 mg daily.  She will return for a Myoview stress tests in the near future. She will be seeing Dr. Felipa Eth for her PCP.  I encouraged her to call his office and make an appointment. Recheck her at her regular visit for followup for cardiac issues.

## 2014-05-09 NOTE — Assessment & Plan Note (Signed)
Her chest pain has both typical and atypical features.  She attributes the chest pain to likely a muscle pull related to working out with her physical therapist for the past 3 months.  Some of the sessions have been quite strenuous for her.  However, she also notes shortness of breath with exertion and the chest discomfort appears to be worse with exertion.  Her EKG today shows normal sinus rhythm with right bundle branch block and widespread T-wave abnormalities.  It suggests inferolateral ischemia.  However it is unchanged from 06/30/13.  We are going to have her return for a Lexiscan Myoview stress test.

## 2014-05-09 NOTE — Patient Instructions (Signed)
TRY IBUPROFEN 200 MG THREE TIMES A DAY AS NEEDED.   DECREASE YOUR ASPIRIN TO 33 MG DAILY  Your physician has requested that you have a lexiscan myoview. For further information please visit [email or web address]. Please follow instruction sheet, as given.  Call Dr Felipa Eth and get in with him for primary care  Keep your July appointment

## 2014-05-09 NOTE — Assessment & Plan Note (Signed)
The patient has a previous history of breast cancer of right breast.  She recently had to go back for additional mammogram views but the final conclusion was that there was nothing wrong in the left breast.  There were some abnormal calcifications but no evidence of recurrent malignancy.  Her present chest discomfort is in a different area from the recent breast problems.

## 2014-05-09 NOTE — Assessment & Plan Note (Signed)
The patient is allergic to statin therapy but is able to fenofibrate and omega-3 fatty acid capsules

## 2014-05-10 ENCOUNTER — Other Ambulatory Visit: Payer: Self-pay | Admitting: *Deleted

## 2014-05-10 DIAGNOSIS — C50919 Malignant neoplasm of unspecified site of unspecified female breast: Secondary | ICD-10-CM

## 2014-05-11 ENCOUNTER — Other Ambulatory Visit (HOSPITAL_BASED_OUTPATIENT_CLINIC_OR_DEPARTMENT_OTHER): Payer: Commercial Managed Care - HMO

## 2014-05-11 ENCOUNTER — Telehealth: Payer: Self-pay | Admitting: *Deleted

## 2014-05-11 DIAGNOSIS — C50419 Malignant neoplasm of upper-outer quadrant of unspecified female breast: Secondary | ICD-10-CM

## 2014-05-11 DIAGNOSIS — C50919 Malignant neoplasm of unspecified site of unspecified female breast: Secondary | ICD-10-CM

## 2014-05-11 LAB — COMPREHENSIVE METABOLIC PANEL (CC13)
ALBUMIN: 3.9 g/dL (ref 3.5–5.0)
ALK PHOS: 52 U/L (ref 40–150)
ALT: 40 U/L (ref 0–55)
AST: 37 U/L — AB (ref 5–34)
Anion Gap: 14 mEq/L — ABNORMAL HIGH (ref 3–11)
BUN: 14.9 mg/dL (ref 7.0–26.0)
CO2: 19 mEq/L — ABNORMAL LOW (ref 22–29)
Calcium: 9.9 mg/dL (ref 8.4–10.4)
Chloride: 108 mEq/L (ref 98–109)
Creatinine: 0.9 mg/dL (ref 0.6–1.1)
GLUCOSE: 106 mg/dL (ref 70–140)
Potassium: 4.1 mEq/L (ref 3.5–5.1)
SODIUM: 142 meq/L (ref 136–145)
TOTAL PROTEIN: 6.8 g/dL (ref 6.4–8.3)
Total Bilirubin: 0.33 mg/dL (ref 0.20–1.20)

## 2014-05-11 LAB — CBC WITH DIFFERENTIAL/PLATELET
BASO%: 0.6 % (ref 0.0–2.0)
Basophils Absolute: 0 10*3/uL (ref 0.0–0.1)
EOS ABS: 0.1 10*3/uL (ref 0.0–0.5)
EOS%: 1.9 % (ref 0.0–7.0)
HCT: 39.1 % (ref 34.8–46.6)
HGB: 13.1 g/dL (ref 11.6–15.9)
LYMPH%: 42.5 % (ref 14.0–49.7)
MCH: 30.6 pg (ref 25.1–34.0)
MCHC: 33.4 g/dL (ref 31.5–36.0)
MCV: 91.5 fL (ref 79.5–101.0)
MONO#: 0.5 10*3/uL (ref 0.1–0.9)
MONO%: 8.9 % (ref 0.0–14.0)
NEUT%: 46.1 % (ref 38.4–76.8)
NEUTROS ABS: 2.6 10*3/uL (ref 1.5–6.5)
Platelets: 250 10*3/uL (ref 145–400)
RBC: 4.27 10*6/uL (ref 3.70–5.45)
RDW: 14 % (ref 11.2–14.5)
WBC: 5.5 10*3/uL (ref 3.9–10.3)
lymph#: 2.4 10*3/uL (ref 0.9–3.3)

## 2014-05-11 NOTE — Telephone Encounter (Signed)
Spoke with patient and confirmed new time for her appointment on 05/18/14 for 2pm.  Patient aware.

## 2014-05-18 ENCOUNTER — Ambulatory Visit (HOSPITAL_BASED_OUTPATIENT_CLINIC_OR_DEPARTMENT_OTHER): Payer: Commercial Managed Care - HMO | Admitting: Oncology

## 2014-05-18 VITALS — BP 131/57 | HR 75 | Temp 98.5°F | Resp 18 | Ht 62.5 in | Wt 191.4 lb

## 2014-05-18 DIAGNOSIS — C50919 Malignant neoplasm of unspecified site of unspecified female breast: Secondary | ICD-10-CM

## 2014-05-18 DIAGNOSIS — Z853 Personal history of malignant neoplasm of breast: Secondary | ICD-10-CM

## 2014-05-18 NOTE — Progress Notes (Signed)
ID: Tresia Revolorio Linam   DOB: 07/23/1941  MR#: [id number]  YCX#:[id number]  PCP: Darlin Coco, MD GYN: SU:  OTHER MD: Dayton Bailiff, Carmon Sails, Hal Stoneking   HISTORY OF PRESENT ILLNESS: From the original intake note:  Ms. Slatten had routine screening mammography at the [facility] on September 06, 2009. There was a possible abnormality in the upper outer quadrant of the right breast so she was brought back for diagnostic studies September 12, 2009.  Magnification views confirmed a 1.4 spiculated mass in the upper outer quadrant of the right breast with a few microcalcifications associated with it.  By ultrasound, this measured 1.5 cm and was highly suggestive of malignancy.  Biopsy was performed the same day and showed ([id number] and [phone]) an invasive lobular carcinoma which was 92% ER and 44% PR positive.  The proliferation marker was 13%.  The tumor did not overexpress Her-2 with a ratio of 1.31.    With this information, the patient was referred to Dr. Brantley Stage and after appropriate discussion, she underwent definitive right lumpectomy and axillary sentinel lymph node sampling October 17, 2009.  The final pathology there ([id number]) showed a 1.5 cm invasive lobular carcinoma, grade 1, with negative though closed margins (the in situ component was at 1 mm from the superior, inferior and medial margins) with no evidence of lymphovascular invasion and the single sentinel lymph node clear.  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   Her subsequent history is as detailed below.   INTERVAL HISTORY: Loralee returns today for followup of her breast cancer. The interval history is significant for her having had a new cluster of microcalcifications noted in her right breast, upper outer quadrant, on routine mammography.. On 04/17/2014 she underwent stereotactic biopsy of this area. The pathology showed only fat necrosis. This was felt to be concordant.   REVIEW OF SYSTEMS: Aside from that  scare, Catlynn is doing fine. She lives in [place] and exercises at the gym there are just about every day. She had some right-sided pain, not accompanied by nausea, vomiting, or fever, which took her to Dr. Mare Ferrari for evaluation. EKG showed the usual bundle branch block and T-wave changes but no significant change from baseline. The pain has since resolved with no other intervention. She does feel short of breath sometimes particularly when walking, and she does use a walker. She has reflux symptoms. Of course she has aches and pains here in there which are not more intense or persistent than before. She describes herself is anxious but not depressed but she feels that she has become more forgetful with time. A detailed review of systems today was otherwise stable  PAST MEDICAL HISTORY: Past Medical History  Diagnosis Date  . Bipolar 1 disorder   . Schizo-affective schizophrenia   . Anxiety   . Morbid obesity   . Hypertension   . Hyperlipidemia   . Osteoarthritis   . Arrhythmia     right bundle branch block  . Thyroid disease     hypothyroidism  . Cancer   1. Dilated cardiomyopathy which the patient tells me does not limit her functional status.   2. History of hypertension.   3. History of bundle branch block. 4. History of a heart murmur. 5. History of osteoarthritis particularly involving the knees. 6. History of hyperlipidemia. 7. History of rosacea. 8. History of hypothyroidism. 9. History of psychiatric disease involving hospitalization in 1999 with a diagnosis of bipolar disorder, chronic schizophrenia and early Alzheimer's disease followed by  Dr. Toy Care.   10. The patient has a 28 pack-year tobacco abuse history but quit 30 years ago.   11. She is status post bilateral cataract surgery. 12. Status post bilateral tonsillectomy and adenoidectomy. Status post cholecystectomy.     FAMILY HISTORY Family History  Problem Relation Age of Onset  . Hypertension Mother   The  patient's mother died from pneumonia at the age of 74 in the setting of schizophrenia.  The patient's father died at the age of 74.  The patient's only sibling was a brother who died from lung cancer in his 74s.  He also had schizophrenia.    GYNECOLOGIC HISTORY: She is GX P0.  She went through the change of life at the age of 73, took hormone replacement about 2 years.  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  She is now retired.  She has lived in [place] about 30 years.  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  She lives in [place].  She attends [employer]      ADVANCED DIRECTIVES:  HEALTH MAINTENANCE: History  Substance Use Topics  . Smoking status: Former Smoker    Quit date: 12/08/1978  . Smokeless tobacco: Never Used  . Alcohol Use: Yes     Comment: 1 time a year     Colonoscopy:  PAP:  Bone density:  Lipid panel:  Allergies  Allergen Reactions  . Macrodantin   . Prozac [Fluoxetine Hcl]   . Sertraline Hcl   . Sulfa Antibiotics   . Wellbutrin [Bupropion Hcl]     Current Outpatient Prescriptions  Medication Sig Dispense Refill  . ALPRAZolam (XANAX) 0.5 MG tablet Take 0.5-2 mg by mouth 4 (four) times daily as needed for anxiety. Pt takes 3-4 at bedtime and 1-3 tabs tid prn      . aspirin 81 MG tablet Take 81 mg by mouth daily.      . calcium-vitamin D (OSCAL WITH D) 500-200 MG-UNIT per tablet Take 1 tablet by mouth 2 (two) times daily.      Marland Kitchen donepezil (ARICEPT) 10 MG tablet Take 10 mg by mouth at bedtime.       . fenofibrate (TRICOR) 145 MG tablet Take 145 mg by mouth every Tuesday, Thursday, and Saturday at 6 PM.      . fenofibrate (TRICOR) 145 MG tablet TAKE 1 TABLET (145 MG TOTAL) BY MOUTH AS DIRECTED. 1 TABLET EVERY OTHER DAY  30 tablet  0  . GARCINIA CAMBOGIA-CHROMIUM PO  Take 2 capsules by mouth 3 (three) times daily.      Marland Kitchen ibuprofen (ADVIL,MOTRIN) 200 MG tablet Take 200 mg by mouth 3 (three) times daily as needed.      . lamoTRIgine (LAMICTAL) 200 MG tablet Take 200 mg by mouth daily.      Marland Kitchen levothyroxine (SYNTHROID, LEVOTHROID) 150 MCG tablet Take 150 mcg by mouth daily before breakfast.      . memantine (NAMENDA) 10 MG tablet Take 10 mg by mouth 2 (two) times daily.        . metoprolol tartrate (LOPRESSOR) 25 MG tablet Take 25 mg by mouth daily.      . metoprolol tartrate (LOPRESSOR) 25 MG tablet TAKE 1 TABLET (25 MG TOTAL) BY MOUTH DAILY.  30 tablet  11  . Multiple Vitamin (MULTIVITAMIN WITH MINERALS)  TABS Take 1 tablet by mouth daily.      . naproxen sodium (ANAPROX) 220 MG tablet Take 220 mg by mouth daily.      Marland Kitchen OLANZapine (ZYPREXA) 15 MG tablet Take 30 mg by mouth at bedtime.        Marland Kitchen omega-3 acid ethyl esters (LOVAZA) 1 G capsule Take 2 g by mouth daily.      Marland Kitchen omeprazole (PRILOSEC) 20 MG capsule TAKE 1 CAPSULE (20 MG TOTAL) BY MOUTH DAILY.  30 capsule  1  . QUEtiapine (SEROQUEL) 25 MG tablet Take 25 mg by mouth at bedtime.      . ramelteon (ROZEREM) 8 MG tablet Take 8 mg by mouth at bedtime.        Marland Kitchen spironolactone (ALDACTONE) 25 MG tablet TAKE 1 TABLET (25 MG TOTAL) BY MOUTH DAILY.  30 tablet  1  . SYNTHROID 150 MCG tablet TAKE 1 TABLET (150 MCG TOTAL) BY MOUTH DAILY.  30 tablet  1   No current facility-administered medications for this visit.    OBJECTIVE: Elderly white woman who appears stated age 31 Vitals:   05/18/14 1411  BP: 131/57  Pulse: 75  Temp: 98.5 F (36.9 C)  Resp: 18     Body mass index is 34.43 kg/(m^2).    ECOG FS: 2  Sclerae unicteric, pupils round and equal Oropharynx clear and moist No peripheral adenopathy Lungs no rales or rhonchi Heart regular rate and rhythm Abd soft, obese, nontender and particularly no tenderness in the right upper quadrant MSK kyphosis but no focal spinal tenderness Neuro: nonfocal,  well-oriented, appropriate affect Breasts: The right breast is status post lumpectomy and radiation. There is no evidence of local recurrence. The right axilla is benign. The left breast is unremarkable  LAB RESULTS: Lab Results  Component Value Date   WBC 5.5 05/11/2014   NEUTROABS 2.6 05/11/2014   HGB 13.1 05/11/2014   HCT 39.1 05/11/2014   MCV 91.5 05/11/2014   PLT 250 05/11/2014      Chemistry      Component Value Date/Time   NA 142 05/11/2014 0911   NA 138 11/02/2013 1100   K 4.1 05/11/2014 0911   K 4.0 11/02/2013 1100   CL 105 11/02/2013 1100   CL 105 05/12/2013 1452   CO2 19* 05/11/2014 0911   CO2 26 11/02/2013 1100   BUN 14.9 05/11/2014 0911   BUN 18 11/02/2013 1100   CREATININE 0.9 05/11/2014 0911   CREATININE 1.1 11/02/2013 1100      Component Value Date/Time   CALCIUM 9.9 05/11/2014 0911   CALCIUM 9.7 11/02/2013 1100   ALKPHOS 52 05/11/2014 0911   ALKPHOS 44 11/02/2013 1100   AST 37* 05/11/2014 0911   AST 24 11/02/2013 1100   ALT 40 05/11/2014 0911   ALT 31 11/02/2013 1100   BILITOT 0.33 05/11/2014 0911   BILITOT 0.5 11/02/2013 1100       Lab Results  Component Value Date   LABCA2 12 04/01/2012    No components found with this basename: [id number]    No results found for this basename: INR,  in the last 168 hours  Urinalysis No results found for this basename: colorurine,  appearanceur,  labspec,  phurine,  glucoseu,  hgbur,  bilirubinur,  ketonesur,  proteinur,  urobilinogen,  nitrite,  leukocytesur    STUDIES: Mammography at [facility] and biopsy of the right upper quadrant of the right breast as discussed above.  ASSESSMENT: 72 y.o.  [place] woman status post  right lumpectomy and sentinel lymph node sampling in November 2010 for a T1cN0, stage IA invasive lobular carcinoma, grade 1, strongly estrogen and progesterone receptor positive, HER-2/neu negative, with low MIB-1.    (1) Status post radiation therapy, completed in February 2011.  (2) Did not tolerate aromatase  inhibitors and is not a candidate for tamoxifen. Followed with observation alone.  PLAN: Ronin is nearly 5 years out from her definitive surgery for noninvasive breast cancer. She understands this has never been a life-threatening problem. At this point I feel comfortable releasing her from followup here. She will need a yearly mammogram, and yearly physician breast exam.  Of course I will be glad to see Alyana at any point in the future, as needed. We are however not making any further routine appointments for her here.  MAGRINAT,GUSTAV C    05/18/2014

## 2014-05-20 ENCOUNTER — Telehealth: Payer: Self-pay

## 2014-05-20 NOTE — Telephone Encounter (Signed)
Order for diagnostic mammogram sent to [facility] 05/20/14.  Sent to scan.

## 2014-05-22 ENCOUNTER — Ambulatory Visit: Payer: Medicare PPO | Admitting: Cardiology

## 2014-05-22 ENCOUNTER — Other Ambulatory Visit: Payer: Self-pay | Admitting: Cardiology

## 2014-05-23 ENCOUNTER — Ambulatory Visit: Payer: Medicare PPO | Admitting: Cardiology

## 2014-05-25 ENCOUNTER — Ambulatory Visit (HOSPITAL_COMMUNITY): Payer: Medicare HMO | Attending: Cardiology | Admitting: Radiology

## 2014-05-25 VITALS — BP 124/63 | HR 75 | Ht 62.0 in | Wt 187.0 lb

## 2014-05-25 DIAGNOSIS — R079 Chest pain, unspecified: Secondary | ICD-10-CM | POA: Insufficient documentation

## 2014-05-25 DIAGNOSIS — I119 Hypertensive heart disease without heart failure: Secondary | ICD-10-CM | POA: Insufficient documentation

## 2014-05-25 MED ORDER — REGADENOSON 0.4 MG/5ML IV SOLN
0.4000 mg | Freq: Once | INTRAVENOUS | Status: AC
  Administered 2014-05-25: 0.4 mg via INTRAVENOUS

## 2014-05-25 MED ORDER — TECHNETIUM TC 99M SESTAMIBI GENERIC - CARDIOLITE
33.0000 | Freq: Once | INTRAVENOUS | Status: AC | PRN
  Administered 2014-05-25: 33 via INTRAVENOUS

## 2014-05-25 MED ORDER — TECHNETIUM TC 99M SESTAMIBI GENERIC - CARDIOLITE
11.0000 | Freq: Once | INTRAVENOUS | Status: AC | PRN
  Administered 2014-05-25: 11 via INTRAVENOUS

## 2014-05-25 NOTE — Progress Notes (Signed)
[facility] [address] [place], [place] [address] [phone]    Cardiology Nuclear Med Study  [name] is a 73 y.o. female     MRN : [id number]     DOB: [birth year]  Procedure Date: 05/25/2014  Nuclear Med Background Indication for Stress Test:  Evaluation for Ischemia History:  No known CAD, Echo 2009 EF 55-60%, MPI 2008 (normal) Cardiac Risk Factors: History of Smoking, Hypertension, Lipids and RBBB  Symptoms:  Chest Pain (last date of chest discomfort was earlier this morning)   Nuclear Pre-Procedure Caffeine/Decaff Intake:  None> 12 hrs NPO After: 11:00pm   Lungs:  clear O2 Sat: 96% on room air. IV 0.9% NS with Angio Cath:  22g  IV Site: R Antecubital x 1, tolerated well  IV Started by:  Irven Baltimore, RN  Chest Size (in):  40 Cup Size: C  Height: 5\' 2"  (1.575 m)  Weight:  187 lb (84.823 kg)  BMI:  Body mass index is 34.19 kg/(m^2). Tech Comments:  Patient held Lopressor x 24 hrs. Irven Baltimore, RN.    Nuclear Med Study 1 or 2 day study: 1 day  Stress Test Type:  Carlton Adam  Reading MD: N/A  Order Authorizing Provider:  Darlin Coco, MD  Resting Radionuclide: Technetium 9m Sestamibi  Resting Radionuclide Dose: 11.0 mCi   Stress Radionuclide:  Technetium 51m Sestamibi  Stress Radionuclide Dose: 33.0 mCi           Stress Protocol Rest HR: 75 Stress HR: 97  Rest BP: 124/63 Stress BP: 98/40  Exercise Time (min): n/a METS: n/a           Dose of Adenosine (mg):  n/a Dose of Lexiscan: 0.4 mg  Dose of Atropine (mg): n/a Dose of Dobutamine: n/a mcg/kg/min (at max HR)  Stress Test Technologist: Glade Lloyd, BS-ES  Nuclear Technologist:  Charlton Amor, CNMT     Rest Procedure:  Myocardial perfusion imaging was performed at rest 45 minutes following the intravenous administration of Technetium 33m Sestamibi. Rest ECG: Normal sinus rhythm. Right bundle branch block with diffuse T wave changes.  Stress Procedure:  The patient  received IV Lexiscan 0.4 mg over 15-seconds.  Technetium 34m Sestamibi injected at 30-seconds.  Quantitative spect images were obtained after a 45 minute delay.  During the infusion the patient complained of SOB and lightheadedness.  These symptoms began to resolve in recovery.  Stress ECG: No significant change from baseline ECG  QPS Raw Data Images:  Normal; no motion artifact; normal heart/lung ratio. Stress Images:  Normal homogeneous uptake in all areas of the myocardium. Rest Images:  Normal homogeneous uptake in all areas of the myocardium. Subtraction (SDS):  No evidence of ischemia. Transient Ischemic Dilatation (Normal <1.22):  1.23 Lung/Heart Ratio (Normal <0.45):  0.56  Quantitative Gated Spect Images QGS EDV:  98 ml QGS ESV:  34 ml  Impression Exercise Capacity:  Lexiscan with no exercise. BP Response:  Normal blood pressure response. Clinical Symptoms:  Shortness of breath ECG Impression:  No significant ST segment change suggestive of ischemia. Comparison with Prior Nuclear Study: No significant change from previous study  Overall Impression:  Normal stress nuclear study. This is a low risk scan. There is no scar or ischemia. There is no change from the study of July, 2008  LV Ejection Fraction: 66%.  LV Wall Motion:  Normal Wall Motion.  Dola Argyle, MD

## 2014-05-29 ENCOUNTER — Other Ambulatory Visit: Payer: Self-pay | Admitting: Cardiology

## 2014-06-07 ENCOUNTER — Encounter: Payer: Self-pay | Admitting: Cardiology

## 2014-06-07 ENCOUNTER — Ambulatory Visit (INDEPENDENT_AMBULATORY_CARE_PROVIDER_SITE_OTHER): Payer: Commercial Managed Care - HMO | Admitting: Cardiology

## 2014-06-07 VITALS — BP 136/76 | HR 81 | Ht 62.0 in | Wt 189.0 lb

## 2014-06-07 DIAGNOSIS — E0789 Other specified disorders of thyroid: Secondary | ICD-10-CM

## 2014-06-07 DIAGNOSIS — I4510 Unspecified right bundle-branch block: Secondary | ICD-10-CM

## 2014-06-07 DIAGNOSIS — E7800 Pure hypercholesterolemia, unspecified: Secondary | ICD-10-CM

## 2014-06-07 DIAGNOSIS — E038 Other specified hypothyroidism: Secondary | ICD-10-CM

## 2014-06-07 DIAGNOSIS — E034 Atrophy of thyroid (acquired): Secondary | ICD-10-CM

## 2014-06-07 DIAGNOSIS — F0390 Unspecified dementia without behavioral disturbance: Secondary | ICD-10-CM

## 2014-06-07 DIAGNOSIS — I119 Hypertensive heart disease without heart failure: Secondary | ICD-10-CM

## 2014-06-07 NOTE — Assessment & Plan Note (Signed)
She has had trouble tolerating statins in the past.  She is tolerating fenofibrate.  We will plan to recheck her lipids at her next visit in 6 months.

## 2014-06-07 NOTE — Assessment & Plan Note (Signed)
Her blood pressure is slightly labile.  Her weight is up 2 pounds.  I rechecked her blood pressure later during the exam and didn't come down to 136/76 which is satisfactory.

## 2014-06-07 NOTE — Patient Instructions (Signed)
Your physician recommends that you continue on your current medications as directed. Please refer to the Current Medication list given to you today.  Your physician wants you to follow-up in: 6 months with fasting labs (lp/bmet/hfp)  You will receive a reminder letter in the mail two months in advance. If you don't receive a letter, please call our office to schedule the follow-up appointment.  

## 2014-06-07 NOTE — Assessment & Plan Note (Signed)
She has not had any symptoms from her right bundle branch block.  No dizziness or syncope.

## 2014-06-07 NOTE — Assessment & Plan Note (Signed)
She has a history of mild dementia, obsessive-compulsive disorder, bipolar syndrome, and borderline schizophrenia.  She is followed by Dr. Toy Care in psychiatry.  She lives at [facility] and she is quite functional.  She enjoys going out to lunch with friends.

## 2014-06-07 NOTE — Progress Notes (Signed)
[name] Date of Birth:  02-Jul-1941 [place] [address] [place], [place]  [address] [phone]        Fax   [phone]   History of Present Illness: This pleasant 72 year old woman is seen for a scheduled followup office visit.  We had recently seen her in early June when she came in.   because of some intermittent right precordial chest discomfort which has been intermittent for the past week.  She does not have any history of known ischemic heart disease.  Her last Myoview was in 2008 and was normal at that time.  We did a Myoview on 05/26/14 which showed no evidence of ischemia and her ejection fraction was 66%.  The right-sided chest discomfort resolved on its own without specific therapy.  She has a past history of essential hypertension and dyslipidemia. She has a past history of breast cancer followed by Dr. Griffith Citron. She also has a complex psychiatric diagnosis including OCD, bipolar syndrome, borderline schizophrenia, and early Alzheimer's. Since last visit she's had no new cardiac symptoms. She has a past history of right bundle branch block.  She had a recent chest x-ray which was unremarkable.  Current Outpatient Prescriptions  Medication Sig Dispense Refill  . ALPRAZolam (XANAX) 0.5 MG tablet Take 0.5-2 mg by mouth 4 (four) times daily as needed for anxiety. Pt takes 3-4 at bedtime and 1-3 tabs tid prn      . aspirin 81 MG tablet Take 81 mg by mouth daily.      . calcium-vitamin D (OSCAL WITH D) 500-200 MG-UNIT per tablet Take 1 tablet by mouth 2 (two) times daily.      Marland Kitchen donepezil (ARICEPT) 10 MG tablet Take 10 mg by mouth at bedtime.       . fenofibrate (TRICOR) 145 MG tablet Take 145 mg by mouth every Tuesday, Thursday, and Saturday at 6 PM.      . fenofibrate (TRICOR) 145 MG tablet TAKE 1 TABLET (145 MG TOTAL) BY MOUTH AS DIRECTED. 1 TABLET EVERY OTHER DAY  30 tablet  0  . ibuprofen (ADVIL,MOTRIN) 200 MG tablet Take 200 mg by mouth 3 (three)  times daily as needed.      . lamoTRIgine (LAMICTAL) 200 MG tablet Take 200 mg by mouth daily.      Marland Kitchen levothyroxine (SYNTHROID, LEVOTHROID) 150 MCG tablet Take 150 mcg by mouth daily before breakfast.      . memantine (NAMENDA) 10 MG tablet Take 10 mg by mouth 2 (two) times daily.        . metoprolol tartrate (LOPRESSOR) 25 MG tablet Take 25 mg by mouth daily.      . metoprolol tartrate (LOPRESSOR) 25 MG tablet TAKE 1 TABLET (25 MG TOTAL) BY MOUTH DAILY.  30 tablet  11  . Multiple Vitamin (MULTIVITAMIN WITH MINERALS) TABS Take 1 tablet by mouth daily.      . naproxen sodium (ANAPROX) 220 MG tablet Take 220 mg by mouth daily.      Marland Kitchen OLANZapine (ZYPREXA) 15 MG tablet Take 30 mg by mouth at bedtime.        Marland Kitchen omega-3 acid ethyl esters (LOVAZA) 1 G capsule Take 2 g by mouth daily.      Marland Kitchen omeprazole (PRILOSEC) 20 MG capsule TAKE 1 CAPSULE (20 MG TOTAL) BY MOUTH DAILY.  30 capsule  0  . QUEtiapine (SEROQUEL) 25 MG tablet Take 25 mg by mouth at bedtime.      Marland Kitchen  ramelteon (ROZEREM) 8 MG tablet Take 8 mg by mouth at bedtime.        Marland Kitchen spironolactone (ALDACTONE) 25 MG tablet TAKE 1 TABLET (25 MG TOTAL) BY MOUTH DAILY.  30 tablet  1  . SYNTHROID 150 MCG tablet TAKE 1 TABLET BY MOUTH EVERY DAY  30 tablet  0   No current facility-administered medications for this visit.    Allergies  Allergen Reactions  . Macrodantin   . Prozac [Fluoxetine Hcl]   . Sertraline Hcl   . Sulfa Antibiotics   . Wellbutrin [Bupropion Hcl]     Patient Active Problem List   Diagnosis Date Noted  . Chest pain 05/09/2014  . Cough 11/02/2013  . Right bundle branch block 02/23/2013  . Irritable bowel syndrome with constipation and diarrhea 10/28/2012  . Breast cancer 04/07/2012  . Dermatitis 12/24/2011  . Hypercholesterolemia 04/30/2011  . Hypothyroidism 04/30/2011  . Dementia 04/30/2011  . Benign hypertensive heart disease without heart failure 04/30/2011    History  Smoking status  . Former Smoker  . Quit date:  12/08/1978  Smokeless tobacco  . Never Used    History  Alcohol Use  . Yes    Comment: 1 time a year    Family History  Problem Relation Age of Onset  . Hypertension Mother     Review of Systems: Constitutional: no fever chills diaphoresis or fatigue or change in weight.  Head and neck: no hearing loss, no epistaxis, no photophobia or visual disturbance. Respiratory: No cough, shortness of breath or wheezing. Cardiovascular: No chest pain peripheral edema, palpitations. Gastrointestinal: No abdominal distention, no abdominal pain, no change in bowel habits hematochezia or melena. Genitourinary: No dysuria, no frequency, no urgency, no nocturia. Musculoskeletal:No arthralgias, no back pain, no gait disturbance or myalgias. Neurological: No dizziness, no headaches, no numbness, no seizures, no syncope, no weakness, no tremors. Hematologic: No lymphadenopathy, no easy bruising. Psychiatric: No confusion, no hallucinations, no sleep disturbance.    Physical Exam: Filed Vitals:   06/07/14 1517  BP: 136/76  Pulse:    general appearance reveals a well-developed well-nourished woman in no distress.The head and neck exam reveals pupils equal and reactive.  Extraocular movements are full.  There is no scleral icterus.  The mouth and pharynx are normal.  The neck is supple.  The carotids reveal no bruits.  The jugular venous pressure is normal.  The  thyroid is not enlarged.  There is no lymphadenopathy.  The chest is clear to percussion and auscultation.  There are no rales or rhonchi.  Expansion of the chest is symmetrical.  The precordium is quiet.  She is mildly tender across the right anterior chest. The first heart sound is normal.  The second heart sound is physiologically split.  There is no murmur gallop rub or click.  There is no abnormal lift or heave.  The abdomen is soft and nontender.  The bowel sounds are normal.  The liver and spleen are not enlarged.  There are no abdominal  masses.  There are no abdominal bruits.  Extremities reveal good pedal pulses.  There is no phlebitis or edema.  There is no cyanosis or clubbing.  Strength is normal and symmetrical in all extremities.  There is no lateralizing weakness.  There are no sensory deficits.  The skin is warm and dry.  There is no rash.     Assessment / Plan: 1. chest pain with typical and atypical features, resolved since last visit.  Likely scan Myoview  on 05/26/14 showed no ischemia and no wall motion abnormalities and her ejection fraction was 66%. 2. Hypercholesterolemia 3. old right bundle branch block 4.  Hypothyroidism 5. complex psychiatric disorder comprised of OCD, bipolar syndrome, borderline schizophrenia, and early Alzheimer's.  Plan: Dr. Felipa Eth will be her new PCP and she has an appointment with him tomorrow Continue current medication. Recheck in 6 months for office visit lipid panel hepatic function panel and basal metabolic panel

## 2014-06-12 ENCOUNTER — Other Ambulatory Visit: Payer: Self-pay | Admitting: Cardiology

## 2014-06-19 ENCOUNTER — Other Ambulatory Visit: Payer: Self-pay | Admitting: Cardiology

## 2014-06-22 ENCOUNTER — Other Ambulatory Visit: Payer: Self-pay | Admitting: Cardiology

## 2014-07-02 ENCOUNTER — Other Ambulatory Visit: Payer: Self-pay | Admitting: Cardiology

## 2014-07-10 ENCOUNTER — Other Ambulatory Visit: Payer: Self-pay | Admitting: Cardiology

## 2014-07-24 ENCOUNTER — Emergency Department (HOSPITAL_COMMUNITY)
Admission: EM | Admit: 2014-07-24 | Discharge: 2014-07-24 | Disposition: A | Discharge status: Home / Self Care | Payer: Medicare HMO | Attending: Emergency Medicine | Admitting: Emergency Medicine

## 2014-07-24 ENCOUNTER — Encounter (HOSPITAL_COMMUNITY): Payer: Self-pay | Admitting: Emergency Medicine

## 2014-07-24 DIAGNOSIS — Y9389 Activity, other specified: Secondary | ICD-10-CM | POA: Diagnosis not present

## 2014-07-24 DIAGNOSIS — F319 Bipolar disorder, unspecified: Secondary | ICD-10-CM | POA: Diagnosis not present

## 2014-07-24 DIAGNOSIS — E039 Hypothyroidism, unspecified: Secondary | ICD-10-CM | POA: Insufficient documentation

## 2014-07-24 DIAGNOSIS — Z79899 Other long term (current) drug therapy: Secondary | ICD-10-CM | POA: Diagnosis not present

## 2014-07-24 DIAGNOSIS — Z87891 Personal history of nicotine dependence: Secondary | ICD-10-CM | POA: Diagnosis not present

## 2014-07-24 DIAGNOSIS — W19XXXA Unspecified fall, initial encounter: Secondary | ICD-10-CM

## 2014-07-24 DIAGNOSIS — S01511A Laceration without foreign body of lip, initial encounter: Secondary | ICD-10-CM

## 2014-07-24 DIAGNOSIS — M1990 Unspecified osteoarthritis, unspecified site: Secondary | ICD-10-CM | POA: Insufficient documentation

## 2014-07-24 DIAGNOSIS — Z859 Personal history of malignant neoplasm, unspecified: Secondary | ICD-10-CM | POA: Diagnosis not present

## 2014-07-24 DIAGNOSIS — Y929 Unspecified place or not applicable: Secondary | ICD-10-CM | POA: Insufficient documentation

## 2014-07-24 DIAGNOSIS — W1809XA Striking against other object with subsequent fall, initial encounter: Secondary | ICD-10-CM | POA: Insufficient documentation

## 2014-07-24 DIAGNOSIS — F411 Generalized anxiety disorder: Secondary | ICD-10-CM | POA: Diagnosis not present

## 2014-07-24 DIAGNOSIS — S01501A Unspecified open wound of lip, initial encounter: Secondary | ICD-10-CM | POA: Diagnosis present

## 2014-07-24 DIAGNOSIS — I10 Essential (primary) hypertension: Secondary | ICD-10-CM | POA: Insufficient documentation

## 2014-07-24 DIAGNOSIS — Z7982 Long term (current) use of aspirin: Secondary | ICD-10-CM | POA: Insufficient documentation

## 2014-07-24 MED ORDER — LIDOCAINE HCL 1 % IJ SOLN
5.0000 mL | Freq: Once | INTRAMUSCULAR | Status: DC
Start: 2014-07-24 — End: 2014-07-24

## 2014-07-24 NOTE — ED Notes (Signed)
Per pt she was getting oob to go to the bathroom and she felt unsteady. When she got to the bathroom she fell forward and hit her mouth on the wall of step in shower.  Laceration to bottom lip.

## 2014-07-24 NOTE — ED Provider Notes (Signed)
CSN: [id number]     Arrival date & time 07/24/14  0153 History   First MD Initiated Contact with Patient 07/24/14 0215     Chief Complaint  Patient presents with  . Fall     (Consider location/radiation/quality/duration/timing/severity/associated sxs/prior Treatment) HPI Comments: Patient states she was constipated, so she took 2 doses of milk of magnesia and 2 Tylox tablets on Friday and on Saturday.  She had many bowel movements.  She did eat, normally.  She's been drinking normally.  All day Sunday, when she got up to go to the bathroom to urinate about 1 AM when she got to the bathroom.  She felt slightly dizzy, and she fell over striking her mouth.  On the leg of her walk in shower.  Denies any loss of consciousness, neck pain, trauma to her hands or knees.  She was afraid to get up on her own because of the laceration to her lip in the amount of bleeding.  Patient is a 73 y.o. female presenting with fall. The history is provided by the patient.  Fall This is a new problem. The current episode started today. The problem has been unchanged. Pertinent negatives include no chills, fever, headaches or weakness. Nothing aggravates the symptoms. She has tried nothing for the symptoms. The treatment provided no relief.    Past Medical History  Diagnosis Date  . Bipolar 1 disorder   . Schizo-affective schizophrenia   . Anxiety   . Morbid obesity   . Hypertension   . Hyperlipidemia   . Osteoarthritis   . Arrhythmia     right bundle branch block  . Thyroid disease     hypothyroidism  . Cancer    Past Surgical History  Procedure Laterality Date  . Knee surgery      Left  . Breast surgery    . Hand surgery      Right-trigger finger  . Cholecystectomy    . Tonsillectomy    . Dilation and curettage of uterus     Family History  Problem Relation Age of Onset  . Hypertension Mother    History  Substance Use Topics  . Smoking status: Former Smoker    Quit date: 12/08/1978  .  Smokeless tobacco: Never Used  . Alcohol Use: Yes     Comment: 1 time a year   OB History   Grav Para Term Preterm Abortions TAB SAB Ect Mult Living                 Review of Systems  Constitutional: Negative for fever and chills.  HENT: Negative for dental problem.   Eyes: Negative for visual disturbance.  Skin: Positive for wound.  Neurological: Positive for dizziness. Negative for weakness and headaches.  All other systems reviewed and are negative.     Allergies  Macrodantin; Paxil; Prozac; Sertraline hcl; Sulfa antibiotics; and Wellbutrin  Home Medications   Prior to Admission medications   Medication Sig Start Date End Date Taking? Authorizing Provider  ALPRAZolam Duanne Moron) 0.5 MG tablet Take 0.5-2 mg by mouth 4 (four) times daily as needed for anxiety. Pt takes 3-4 at bedtime and 1-3 tabs tid prn   Yes Historical Provider, MD  aspirin 81 MG tablet Take 81 mg by mouth daily.   Yes Historical Provider, MD  donepezil (ARICEPT) 10 MG tablet Take 10 mg by mouth every morning.    Yes Historical Provider, MD  fenofibrate (TRICOR) 145 MG tablet Take 145 mg by mouth every Tuesday, Thursday,  and Saturday at 6 PM.   Yes Historical Provider, MD  lamoTRIgine (LAMICTAL) 200 MG tablet Take 200 mg by mouth 2 (two) times daily.    Yes Historical Provider, MD  levothyroxine (SYNTHROID, LEVOTHROID) 150 MCG tablet Take 150 mcg by mouth daily before breakfast.   Yes Historical Provider, MD  memantine (NAMENDA) 10 MG tablet Take 10 mg by mouth 2 (two) times daily.     Yes Historical Provider, MD  metoprolol tartrate (LOPRESSOR) 25 MG tablet Take 25 mg by mouth daily.   Yes Historical Provider, MD  OLANZapine (ZYPREXA) 15 MG tablet Take 30 mg by mouth at bedtime.     Yes Historical Provider, MD  omeprazole (PRILOSEC) 20 MG capsule Take 20 mg by mouth daily.   Yes Historical Provider, MD  OVER THE COUNTER MEDICATION Place 1 patch onto the skin daily. Weight loss herbal patch.   Yes Historical  Provider, MD  QUEtiapine (SEROQUEL) 25 MG tablet Take 50 mg by mouth at bedtime.    Yes Historical Provider, MD  ramelteon (ROZEREM) 8 MG tablet Take 8 mg by mouth at bedtime.     Yes Historical Provider, MD  spironolactone (ALDACTONE) 25 MG tablet Take 25 mg by mouth daily.   Yes Historical Provider, MD   BP 149/66  Pulse 82  Temp(Src) 97.4 F (36.3 C) (Oral)  Resp 18  Ht 5\' 2"  (1.575 m)  Wt 185 lb (83.915 kg)  BMI 33.83 kg/m2  SpO2 95% Physical Exam  Nursing note and vitals reviewed. Constitutional: She is oriented to person, place, and time. She appears well-nourished.  HENT:  Mouth/Throat:    Eyes: Pupils are equal, round, and reactive to light.  Neck: Normal range of motion. No spinous process tenderness and no muscular tenderness present. Normal range of motion present.  Cardiovascular: Normal rate and regular rhythm.   Pulmonary/Chest: Effort normal.  Musculoskeletal: Normal range of motion.  Neurological: She is alert and oriented to person, place, and time.  Skin: Skin is warm.    ED Course  LACERATION REPAIR Date/Time: 07/24/2014 4:40 AM Performed by: Garald Balding Authorized by: Garald Balding Consent: Verbal consent obtained. written consent not obtained. Risks and benefits: risks, benefits and alternatives were discussed Consent given by: patient Patient understanding: patient states understanding of the procedure being performed Patient identity confirmed: verbally with patient Body area: head/neck Location details: lower lip Full thickness lip laceration: yes [place] border involved: no Laceration length: 0.5 cm Foreign bodies: no foreign bodies Tendon involvement: none Nerve involvement: none Vascular damage: no Anesthesia: local infiltration Local anesthetic: lidocaine 1% without epinephrine Anesthetic total: 0.5 ml Patient sedated: no Preparation: Patient was prepped and draped in the usual sterile fashion. Irrigation solution: saline Amount  of cleaning: standard Debridement: none Degree of undermining: none Subcutaneous closure: 4-0 Vicryl Number of sutures: 3 Technique: simple Approximation: close Approximation difficulty: simple Patient tolerance: Patient tolerated the procedure well with no immediate complications.   (including critical care time) Labs Review Labs Reviewed - No data to display  Imaging Review No results found.   EKG Interpretation None      MDM   Final diagnoses:  None         Garald Balding, NP 07/24/14 [phone]

## 2014-07-24 NOTE — ED Notes (Signed)
Pt transported from [facility] by EMS for fall after attempting to go to [facility]. Pt states she believes she is dehydrated d/t laxative usage after taking narcotics and becoming constipated. Laceration noted to lower lip, face struck sink, denies LOC. Bleeding controlled. Uses walker at home.

## 2014-07-24 NOTE — ED Provider Notes (Signed)
Medical screening examination/treatment/procedure(s) were performed by non-physician practitioner and as supervising physician I was immediately available for consultation/collaboration.   EKG Interpretation None       Kalman Drape, MD 07/24/14 [phone]

## 2014-07-24 NOTE — ED Notes (Signed)
Bed: GY69 Expected date:  Expected time:  Means of arrival:  Comments: EMS- fall lip lac

## 2014-07-24 NOTE — Discharge Instructions (Signed)
Your sutures will disolve over the next 10+ days  Try to keep them clean by rinsing with water after meals

## 2014-09-12 ENCOUNTER — Other Ambulatory Visit: Payer: Self-pay | Admitting: Cardiology

## 2014-11-06 ENCOUNTER — Other Ambulatory Visit: Payer: Self-pay | Admitting: Cardiology

## 2014-11-09 ENCOUNTER — Encounter: Payer: Self-pay | Admitting: Cardiology

## 2014-11-09 ENCOUNTER — Ambulatory Visit (INDEPENDENT_AMBULATORY_CARE_PROVIDER_SITE_OTHER): Payer: Commercial Managed Care - HMO | Admitting: Cardiology

## 2014-11-09 ENCOUNTER — Other Ambulatory Visit (INDEPENDENT_AMBULATORY_CARE_PROVIDER_SITE_OTHER): Payer: Commercial Managed Care - HMO | Admitting: *Deleted

## 2014-11-09 VITALS — BP 130/74 | HR 63 | Ht 62.0 in | Wt 187.0 lb

## 2014-11-09 DIAGNOSIS — R351 Nocturia: Secondary | ICD-10-CM

## 2014-11-09 DIAGNOSIS — E7800 Pure hypercholesterolemia, unspecified: Secondary | ICD-10-CM

## 2014-11-09 DIAGNOSIS — E038 Other specified hypothyroidism: Secondary | ICD-10-CM

## 2014-11-09 DIAGNOSIS — M1712 Unilateral primary osteoarthritis, left knee: Secondary | ICD-10-CM

## 2014-11-09 DIAGNOSIS — I119 Hypertensive heart disease without heart failure: Secondary | ICD-10-CM

## 2014-11-09 DIAGNOSIS — M179 Osteoarthritis of knee, unspecified: Secondary | ICD-10-CM

## 2014-11-09 LAB — HEPATIC FUNCTION PANEL
ALK PHOS: 51 U/L (ref 39–117)
ALT: 42 U/L — ABNORMAL HIGH (ref 0–35)
AST: 35 U/L (ref 0–37)
Albumin: 4.3 g/dL (ref 3.5–5.2)
BILIRUBIN DIRECT: 0 mg/dL (ref 0.0–0.3)
BILIRUBIN TOTAL: 0.6 mg/dL (ref 0.2–1.2)
TOTAL PROTEIN: 7.2 g/dL (ref 6.0–8.3)

## 2014-11-09 LAB — BASIC METABOLIC PANEL
BUN: 16 mg/dL (ref 6–23)
CALCIUM: 9.5 mg/dL (ref 8.4–10.5)
CO2: 25 mEq/L (ref 19–32)
CREATININE: 1 mg/dL (ref 0.4–1.2)
Chloride: 105 mEq/L (ref 96–112)
GFR: 61.02 mL/min (ref >60.00)
GLUCOSE: 94 mg/dL (ref 70–99)
Potassium: 4 mEq/L (ref 3.5–5.1)
Sodium: 140 mEq/L (ref 135–145)

## 2014-11-09 LAB — LIPID PANEL
Cholesterol: 193 mg/dL (ref 0–200)
HDL: 33 mg/dL — ABNORMAL LOW (ref >39.00)
NonHDL: 160
Total CHOL/HDL Ratio: 6
Triglycerides: 233 mg/dL — ABNORMAL HIGH (ref 0.0–149.0)
VLDL: 46.6 mg/dL — ABNORMAL HIGH (ref 0.0–40.0)

## 2014-11-09 NOTE — Assessment & Plan Note (Signed)
No further chest discomfort

## 2014-11-09 NOTE — Patient Instructions (Signed)
Your physician recommends that you continue on your current medications as directed. Please refer to the Current Medication list given to you today.  Your physician wants you to follow-up in: 4 months with fasting labs (lp/bmet/hfp) You will receive a reminder letter in the mail two months in advance. If you don't receive a letter, please call our office to schedule the follow-up appointment.  

## 2014-11-09 NOTE — Progress Notes (Signed)
[name] Date of Birth:  [birth year] [place] [address] [place], [place]  [address] [phone]        Fax   [phone]   History of Present Illness: This pleasant 73 year old woman is seen for a scheduled followup office visit.  We had recently seen her in early June when she came in.   because of some intermittent right precordial chest discomfort which has been intermittent for the past week.  She does not have any history of known ischemic heart disease.  Her last Myoview was in 2008 and was normal at that time.  We did a Myoview on 05/26/14 which showed no evidence of ischemia and her ejection fraction was 66%.  The right-sided chest discomfort resolved on its own without specific therapy.  She has a past history of essential hypertension and dyslipidemia. She has a past history of breast cancer followed by Dr. Griffith Citron. She also has a complex psychiatric diagnosis including OCD, bipolar syndrome, borderline schizophrenia, and early Alzheimer's. Since last visit she's had no new cardiac symptoms. She has a past history of right bundle branch block.  She had a recent chest x-ray which was unremarkable.  Since last visit she has been doing well.  She has had worsening left knee arthritis and will be seeing her orthopedist.  Current Outpatient Prescriptions  Medication Sig Dispense Refill  . ALPRAZolam (XANAX) 0.5 MG tablet Take 0.5-2 mg by mouth 4 (four) times daily as needed for anxiety. Pt takes 3-4 at bedtime and 1-3 tabs tid prn    . aspirin 81 MG tablet Take 81 mg by mouth daily.    Marland Kitchen donepezil (ARICEPT) 10 MG tablet Take 10 mg by mouth every morning.     . fenofibrate (TRICOR) 145 MG tablet Take 145 mg by mouth every Tuesday, Thursday, and Saturday at 6 PM.    . lamoTRIgine (LAMICTAL) 200 MG tablet Take 200 mg by mouth 2 (two) times daily.     Marland Kitchen levothyroxine (SYNTHROID, LEVOTHROID) 150 MCG tablet Take 150 mcg by mouth daily before breakfast.    .  memantine (NAMENDA) 10 MG tablet Take 10 mg by mouth 2 (two) times daily.      . metoprolol tartrate (LOPRESSOR) 25 MG tablet Take 25 mg by mouth daily.    Marland Kitchen OLANZapine (ZYPREXA) 15 MG tablet Take 30 mg by mouth at bedtime.      Marland Kitchen omeprazole (PRILOSEC) 20 MG capsule Take 20 mg by mouth daily.    Marland Kitchen OVER THE COUNTER MEDICATION Place 1 patch onto the skin daily. Weight loss herbal patch.    . QUEtiapine (SEROQUEL) 25 MG tablet Take 50 mg by mouth at bedtime.     . ramelteon (ROZEREM) 8 MG tablet Take 8 mg by mouth at bedtime.      Marland Kitchen spironolactone (ALDACTONE) 25 MG tablet Take 25 mg by mouth daily.    Marland Kitchen spironolactone (ALDACTONE) 25 MG tablet TAKE 1 TABLET (25 MG TOTAL) BY MOUTH DAILY. 30 tablet 1   No current facility-administered medications for this visit.    Allergies  Allergen Reactions  . Macrodantin Other (See Comments)    unknown  . Paxil [Paroxetine] Other (See Comments)    Made pt feel crazy   . Prozac [Fluoxetine Hcl] Other (See Comments)    Pt felt crazy   . Sertraline Hcl   . Sulfa Antibiotics Other (See Comments)    unknown  . Wellbutrin [Bupropion Hcl] Other (See Comments)  unknown    Patient Active Problem List   Diagnosis Date Noted  . Osteoarthritis of left knee 11/09/2014  . Nocturia more than twice per night 11/09/2014  . Chest pain 05/09/2014  . Cough 11/02/2013  . Right bundle branch block 02/23/2013  . Irritable bowel syndrome with constipation and diarrhea 10/28/2012  . Breast cancer 04/07/2012  . Dermatitis 12/24/2011  . Hypercholesterolemia 04/30/2011  . Hypothyroidism 04/30/2011  . Dementia 04/30/2011  . Benign hypertensive heart disease without heart failure 04/30/2011    History  Smoking status  . Former Smoker  . Quit date: 12/08/1978  Smokeless tobacco  . Never Used    History  Alcohol Use  . Yes    Comment: 1 time a year    Family History  Problem Relation Age of Onset  . Hypertension Mother     Review of  Systems: Constitutional: no fever chills diaphoresis or fatigue or change in weight.  Head and neck: no hearing loss, no epistaxis, no photophobia or visual disturbance. Respiratory: No cough, shortness of breath or wheezing. Cardiovascular: No chest pain peripheral edema, palpitations. Gastrointestinal: No abdominal distention, no abdominal pain, no change in bowel habits hematochezia or melena. Genitourinary: No dysuria, no frequency, no urgency, no nocturia. Musculoskeletal:No arthralgias, no back pain, no gait disturbance or myalgias. Neurological: No dizziness, no headaches, no numbness, no seizures, no syncope, no weakness, no tremors. Hematologic: No lymphadenopathy, no easy bruising. Psychiatric: No confusion, no hallucinations, no sleep disturbance.    Physical Exam: Filed Vitals:   11/09/14 0857  BP: 130/74  Pulse: 63   general appearance reveals a well-developed well-nourished woman in no distress.The head and neck exam reveals pupils equal and reactive.  Extraocular movements are full.  There is no scleral icterus.  The mouth and pharynx are normal.  The neck is supple.  The carotids reveal no bruits.  The jugular venous pressure is normal.  The  thyroid is not enlarged.  There is no lymphadenopathy.  The chest is clear to percussion and auscultation.  There are no rales or rhonchi.  Expansion of the chest is symmetrical.  The precordium is quiet.  She is mildly tender across the right anterior chest. The first heart sound is normal.  The second heart sound is physiologically split.  There is no murmur gallop rub or click.  There is no abnormal lift or heave.  The abdomen is soft and nontender.  The bowel sounds are normal.  The liver and spleen are not enlarged.  There are no abdominal masses.  There are no abdominal bruits.  Extremities reveal good pedal pulses.  There is no phlebitis or edema.  There is no cyanosis or clubbing.  Strength is normal and symmetrical in all extremities.   There is no lateralizing weakness.  There are no sensory deficits.  The skin is warm and dry.  There is no rash.     Assessment / Plan: 1. chest pain with typical and atypical features, resolved since last visit.  Likely scan Myoview on 05/26/14 showed no ischemia and no wall motion abnormalities and her ejection fraction was 66%. 2. Hypercholesterolemia 3. old right bundle branch block 4.  Hypothyroidism 5. complex psychiatric disorder comprised of OCD, bipolar syndrome, borderline schizophrenia, and early Alzheimer's. 6.  Worsening osteoarthritis of left knee.  She suspects she may need to have knee replacement soon.  Plan: Continue current medication.  Recheck in 4 months for office visit lipid panel hepatic function panel and basal metabolic panel.

## 2014-11-09 NOTE — Assessment & Plan Note (Signed)
The patient has nocturia 4 or 5 times a night.  She is not having any dysuria or discomfort.  She will discuss this with her PCP.

## 2014-11-09 NOTE — Assessment & Plan Note (Signed)
Clinically euthyroid on current medication

## 2014-11-11 LAB — LDL CHOLESTEROL, DIRECT: Direct LDL: 118 mg/dL

## 2014-11-13 NOTE — Progress Notes (Signed)
Quick Note:  Please report to patient. The recent labs are stable. Continue same medication and careful diet. Cholesterol is stable. The triglycerides are higher. Work harder on low carbohydrate, low sweet diet and try to lose weight. ______

## 2014-12-22 DIAGNOSIS — M1712 Unilateral primary osteoarthritis, left knee: Secondary | ICD-10-CM | POA: Diagnosis not present

## 2014-12-25 ENCOUNTER — Other Ambulatory Visit: Payer: Self-pay | Admitting: Cardiology

## 2014-12-28 DIAGNOSIS — M1712 Unilateral primary osteoarthritis, left knee: Secondary | ICD-10-CM | POA: Diagnosis not present

## 2015-01-05 DIAGNOSIS — M1712 Unilateral primary osteoarthritis, left knee: Secondary | ICD-10-CM | POA: Diagnosis not present

## 2015-01-08 ENCOUNTER — Other Ambulatory Visit: Payer: Self-pay | Admitting: Cardiology

## 2015-01-23 ENCOUNTER — Other Ambulatory Visit: Payer: Self-pay | Admitting: Cardiology

## 2015-02-08 ENCOUNTER — Other Ambulatory Visit: Payer: Self-pay | Admitting: Dermatology

## 2015-02-08 DIAGNOSIS — D485 Neoplasm of uncertain behavior of skin: Secondary | ICD-10-CM | POA: Diagnosis not present

## 2015-02-08 DIAGNOSIS — L738 Other specified follicular disorders: Secondary | ICD-10-CM | POA: Diagnosis not present

## 2015-02-15 DIAGNOSIS — M1712 Unilateral primary osteoarthritis, left knee: Secondary | ICD-10-CM | POA: Diagnosis not present

## 2015-02-23 DIAGNOSIS — H1851 Endothelial corneal dystrophy: Secondary | ICD-10-CM | POA: Diagnosis not present

## 2015-02-23 DIAGNOSIS — H3531 Nonexudative age-related macular degeneration: Secondary | ICD-10-CM | POA: Diagnosis not present

## 2015-02-23 DIAGNOSIS — H40013 Open angle with borderline findings, low risk, bilateral: Secondary | ICD-10-CM | POA: Diagnosis not present

## 2015-02-23 DIAGNOSIS — Z961 Presence of intraocular lens: Secondary | ICD-10-CM | POA: Diagnosis not present

## 2015-02-26 ENCOUNTER — Other Ambulatory Visit: Payer: Self-pay | Admitting: Cardiology

## 2015-03-05 ENCOUNTER — Other Ambulatory Visit: Payer: Self-pay | Admitting: Cardiology

## 2015-03-07 ENCOUNTER — Other Ambulatory Visit: Payer: Self-pay | Admitting: Cardiology

## 2015-03-15 DIAGNOSIS — Z13820 Encounter for screening for osteoporosis: Secondary | ICD-10-CM | POA: Diagnosis not present

## 2015-03-15 DIAGNOSIS — Z01419 Encounter for gynecological examination (general) (routine) without abnormal findings: Secondary | ICD-10-CM | POA: Diagnosis not present

## 2015-03-15 DIAGNOSIS — M8580 Other specified disorders of bone density and structure, unspecified site: Secondary | ICD-10-CM | POA: Diagnosis not present

## 2015-03-15 DIAGNOSIS — Z6834 Body mass index (BMI) 34.0-34.9, adult: Secondary | ICD-10-CM | POA: Diagnosis not present

## 2015-03-15 DIAGNOSIS — C50911 Malignant neoplasm of unspecified site of right female breast: Secondary | ICD-10-CM | POA: Diagnosis not present

## 2015-03-26 ENCOUNTER — Other Ambulatory Visit: Payer: Self-pay | Admitting: Cardiology

## 2015-03-27 ENCOUNTER — Telehealth: Payer: Self-pay | Admitting: Cardiology

## 2015-03-27 NOTE — Telephone Encounter (Signed)
New Message  This morning she woke up with a swollen bottom lip. She states it doesn't hurt but a thick soar on the inside of her lip. Not sure if she bit it. Or if ice will take the swelling away. Request a call back to discuss.

## 2015-03-27 NOTE — Telephone Encounter (Signed)
Spoke with patient and she denies eating anything different, pain, difficulty breathing, or shortness of breath Patient will try ice today and call back if no better

## 2015-03-28 NOTE — Telephone Encounter (Signed)
Called to follow up on how patient was doing today Left message to call back

## 2015-03-30 NOTE — Telephone Encounter (Signed)
Called and spoke with patient and swelling has gone away

## 2015-04-09 ENCOUNTER — Other Ambulatory Visit: Payer: Self-pay | Admitting: Cardiology

## 2015-04-22 ENCOUNTER — Other Ambulatory Visit: Payer: Self-pay | Admitting: Cardiology

## 2015-05-02 DIAGNOSIS — R922 Inconclusive mammogram: Secondary | ICD-10-CM | POA: Diagnosis not present

## 2015-05-02 DIAGNOSIS — Z853 Personal history of malignant neoplasm of breast: Secondary | ICD-10-CM | POA: Diagnosis not present

## 2015-05-07 ENCOUNTER — Other Ambulatory Visit: Payer: Self-pay | Admitting: Cardiology

## 2015-05-24 ENCOUNTER — Other Ambulatory Visit: Payer: Self-pay | Admitting: Cardiology

## 2015-06-04 ENCOUNTER — Other Ambulatory Visit: Payer: Self-pay | Admitting: Cardiology

## 2015-06-15 DIAGNOSIS — I10 Essential (primary) hypertension: Secondary | ICD-10-CM | POA: Diagnosis not present

## 2015-06-15 DIAGNOSIS — Z79899 Other long term (current) drug therapy: Secondary | ICD-10-CM | POA: Diagnosis not present

## 2015-06-15 DIAGNOSIS — G301 Alzheimer's disease with late onset: Secondary | ICD-10-CM | POA: Diagnosis not present

## 2015-06-15 DIAGNOSIS — R350 Frequency of micturition: Secondary | ICD-10-CM | POA: Diagnosis not present

## 2015-06-15 DIAGNOSIS — F259 Schizoaffective disorder, unspecified: Secondary | ICD-10-CM | POA: Diagnosis not present

## 2015-06-15 DIAGNOSIS — E039 Hypothyroidism, unspecified: Secondary | ICD-10-CM | POA: Diagnosis not present

## 2015-06-15 DIAGNOSIS — Z6833 Body mass index (BMI) 33.0-33.9, adult: Secondary | ICD-10-CM | POA: Diagnosis not present

## 2015-06-15 DIAGNOSIS — E669 Obesity, unspecified: Secondary | ICD-10-CM | POA: Diagnosis not present

## 2015-06-24 ENCOUNTER — Other Ambulatory Visit: Payer: Self-pay | Admitting: Cardiology

## 2015-06-25 ENCOUNTER — Other Ambulatory Visit: Payer: Self-pay

## 2015-06-25 MED ORDER — OMEPRAZOLE 20 MG PO CPDR: 20.0000 mg | DELAYED_RELEASE_CAPSULE | Freq: Every day | ORAL | Status: DC

## 2015-07-09 ENCOUNTER — Other Ambulatory Visit: Payer: Self-pay | Admitting: Cardiology

## 2015-07-12 DIAGNOSIS — H40013 Open angle with borderline findings, low risk, bilateral: Secondary | ICD-10-CM | POA: Diagnosis not present

## 2015-07-16 ENCOUNTER — Other Ambulatory Visit: Payer: Self-pay | Admitting: Cardiology

## 2015-07-23 ENCOUNTER — Other Ambulatory Visit: Payer: Self-pay | Admitting: Cardiology

## 2015-07-24 DIAGNOSIS — B009 Herpesviral infection, unspecified: Secondary | ICD-10-CM | POA: Diagnosis not present

## 2015-07-25 ENCOUNTER — Other Ambulatory Visit: Payer: Self-pay | Admitting: Cardiology

## 2015-07-25 NOTE — Telephone Encounter (Signed)
Please advise on refill. Patient is overdue for an appointment and has not had a tsh since 2014. Thanks, MI

## 2015-07-25 NOTE — Telephone Encounter (Signed)
Okay to refill synthroid

## 2015-07-26 ENCOUNTER — Other Ambulatory Visit: Payer: Self-pay | Admitting: *Deleted

## 2015-07-26 ENCOUNTER — Telehealth: Payer: Self-pay | Admitting: Cardiology

## 2015-07-26 DIAGNOSIS — E039 Hypothyroidism, unspecified: Secondary | ICD-10-CM

## 2015-07-26 DIAGNOSIS — E7800 Pure hypercholesterolemia, unspecified: Secondary | ICD-10-CM

## 2015-07-26 MED ORDER — SYNTHROID 150 MCG PO TABS: 150.0000 ug | ORAL_TABLET | Freq: Every day | ORAL | Status: DC

## 2015-07-26 NOTE — Telephone Encounter (Signed)
New Message  Pt calling to see if labs are required for her OV on 11/2. No orders in system are up to date,. Please call back and discuss.

## 2015-07-26 NOTE — Telephone Encounter (Signed)
Left message to call back  

## 2015-07-27 ENCOUNTER — Encounter: Payer: Self-pay | Admitting: Cardiology

## 2015-07-27 NOTE — Telephone Encounter (Signed)
No answer, will try again later.

## 2015-07-27 NOTE — Telephone Encounter (Signed)
Spoke with patient and she stated she did have labs at PCP in the last 4-6 weeks Called Dr Carlyle Lipa office and requested

## 2015-07-27 NOTE — Telephone Encounter (Signed)
FU  Pt returning [place] phone call

## 2015-08-27 ENCOUNTER — Other Ambulatory Visit: Payer: Self-pay | Admitting: Cardiology

## 2015-10-03 DIAGNOSIS — H00021 Hordeolum internum right upper eyelid: Secondary | ICD-10-CM | POA: Diagnosis not present

## 2015-10-03 DIAGNOSIS — H01003 Unspecified blepharitis right eye, unspecified eyelid: Secondary | ICD-10-CM | POA: Diagnosis not present

## 2015-10-10 ENCOUNTER — Ambulatory Visit (INDEPENDENT_AMBULATORY_CARE_PROVIDER_SITE_OTHER): Payer: Commercial Managed Care - HMO | Admitting: Cardiology

## 2015-10-10 ENCOUNTER — Encounter: Payer: Self-pay | Admitting: Cardiology

## 2015-10-10 VITALS — BP 116/74 | HR 64 | Ht 62.5 in | Wt 190.4 lb

## 2015-10-10 DIAGNOSIS — E7800 Pure hypercholesterolemia, unspecified: Secondary | ICD-10-CM | POA: Diagnosis not present

## 2015-10-10 DIAGNOSIS — E039 Hypothyroidism, unspecified: Secondary | ICD-10-CM | POA: Diagnosis not present

## 2015-10-10 DIAGNOSIS — I4510 Unspecified right bundle-branch block: Secondary | ICD-10-CM | POA: Diagnosis not present

## 2015-10-10 DIAGNOSIS — I119 Hypertensive heart disease without heart failure: Secondary | ICD-10-CM

## 2015-10-10 DIAGNOSIS — F0390 Unspecified dementia without behavioral disturbance: Secondary | ICD-10-CM | POA: Diagnosis not present

## 2015-10-10 LAB — BASIC METABOLIC PANEL
BUN: 20 mg/dL (ref 7–25)
CHLORIDE: 104 mmol/L (ref 98–110)
CO2: 27 mmol/L (ref 20–31)
CREATININE: 0.89 mg/dL (ref 0.60–0.93)
Calcium: 9.8 mg/dL (ref 8.6–10.4)
Glucose, Bld: 93 mg/dL (ref 65–99)
Potassium: 4.3 mmol/L (ref 3.5–5.3)
Sodium: 140 mmol/L (ref 135–146)

## 2015-10-10 LAB — HEPATIC FUNCTION PANEL
ALT: 33 U/L — ABNORMAL HIGH (ref 6–29)
AST: 26 U/L (ref 10–35)
Albumin: 4.3 g/dL (ref 3.6–5.1)
Alkaline Phosphatase: 43 U/L (ref 33–130)
BILIRUBIN INDIRECT: 0.2 mg/dL (ref 0.2–1.2)
Bilirubin, Direct: 0.1 mg/dL (ref <=0.2)
TOTAL PROTEIN: 6.8 g/dL (ref 6.1–8.1)
Total Bilirubin: 0.3 mg/dL (ref 0.2–1.2)

## 2015-10-10 LAB — LIPID PANEL
CHOLESTEROL: 187 mg/dL (ref 125–200)
HDL: 36 mg/dL — ABNORMAL LOW (ref >=46)
LDL CALC: 88 mg/dL (ref <130)
TRIGLYCERIDES: 315 mg/dL — AB (ref <150)
Total CHOL/HDL Ratio: 5.2 Ratio — ABNORMAL HIGH (ref <=5.0)
VLDL: 63 mg/dL — ABNORMAL HIGH (ref <30)

## 2015-10-10 LAB — T4, FREE: FREE T4: 0.94 ng/dL (ref 0.80–1.80)

## 2015-10-10 LAB — TSH: TSH: 1.339 u[IU]/mL (ref 0.350–4.500)

## 2015-10-10 NOTE — Progress Notes (Signed)
Cardiology Office Note   Date:  10/10/2015   ID:  [name], DOB 1941/05/04, MRN [id number]  PCP:  Mathews Argyle, MD  Cardiologist: Darlin Coco MD  No chief complaint on file.     History of Present Illness: [name] is a 74 y.o. female who presents for a 6 month follow-up visit  This pleasant 74 year old woman is seen for a scheduled followup office visit.  She does not have any history of known ischemic heart disease. . We did a Myoview on 05/26/14 which showed no evidence of ischemia and her ejection fraction was 66%.  She has a past history of essential hypertension and dyslipidemia. She has a past history of breast cancer followed by Dr. Griffith Citron. She also has a complex psychiatric diagnosis including OCD, bipolar syndrome, borderline schizophrenia, and early Alzheimer's. Since last visit she's had no new cardiac symptoms. She has a past history of right bundle branch block. She had a recent chest x-ray which was unremarkable. Since last visit she has been doing well. She has had worsening left knee arthritis and will be seeing her orthopedist.  He has been getting intermittent injections into the left knee. She has not been having any recent chest pain.  She has chronic shortness of breath which has not worsened.  She denies dizziness or syncope.  She is trying to lose weight.  Her weight is up slightly today.  Past Medical History  Diagnosis Date  . Bipolar 1 disorder ([place])   . Schizo-affective schizophrenia ([place])   . Anxiety   . Morbid obesity ([place])   . Hypertension   . Hyperlipidemia   . Osteoarthritis   . Arrhythmia     right bundle branch block  . Thyroid disease     hypothyroidism  . Cancer [facility])     Past Surgical History  Procedure Laterality Date  . Knee surgery      Left  . Breast surgery    . Hand surgery      Right-trigger finger  . Cholecystectomy    . Tonsillectomy    . Dilation and curettage of uterus       Current  Outpatient Prescriptions  Medication Sig Dispense Refill  . acetaminophen (TYLENOL) 500 MG tablet Take 1,000 mg by mouth at bedtime.    . ALPRAZolam (XANAX) 0.5 MG tablet Take 0.5-2 mg by mouth 4 (four) times daily as needed for anxiety. Patient may take 3-4 tablets by mouth as needed at bedtime for sleep and 1-3 tablets by mouth three times daily as needed for anxiety    . aspirin 81 MG tablet Take 81 mg by mouth daily.    . Calcium Carbonate (CALCIUM 600 PO) Take 1 tablet by mouth daily.    Marland Kitchen co-enzyme Q-10 30 MG capsule Take 30 mg by mouth daily.    Marland Kitchen donepezil (ARICEPT) 10 MG tablet Take 10 mg by mouth every morning.     . fenofibrate (TRICOR) 145 MG tablet Take 145 mg by mouth every Tuesday, Thursday, and Saturday at 6 PM. 30 tablet 0  . lamoTRIgine (LAMICTAL) 200 MG tablet Take 200 mg by mouth 2 (two) times daily.     . memantine (NAMENDA) 10 MG tablet Take 10 mg by mouth 2 (two) times daily.      . metoprolol tartrate (LOPRESSOR) 25 MG tablet Take 25 mg by mouth daily.    . Multiple Vitamins-Minerals (MULTIVITAMIN ADULT PO) Take 1 tablet by mouth daily.    Marland Kitchen  OLANZapine (ZYPREXA) 15 MG tablet Take 30 mg by mouth at bedtime.      Marland Kitchen omeprazole (PRILOSEC) 20 MG capsule TAKE ONE CAPSULE BY MOUTH EVERY DAY 30 capsule 11  . QUEtiapine (SEROQUEL) 25 MG tablet Take 50 mg by mouth at bedtime.     . ramelteon (ROZEREM) 8 MG tablet Take 8 mg by mouth at bedtime.      Marland Kitchen spironolactone (ALDACTONE) 25 MG tablet TAKE 1 TABLET (25 MG TOTAL) BY MOUTH DAILY. 30 tablet 5  . SYNTHROID 150 MCG tablet Take 1 tablet (150 mcg total) by mouth daily. 30 tablet 2   No current facility-administered medications for this visit.    Allergies:   Macrodantin; Paxil; Prozac; Sertraline hcl; Sulfa antibiotics; and Wellbutrin    Social History:  The patient  reports that she quit smoking about 36 years ago. She has never used smokeless tobacco. She reports that she drinks alcohol. She reports that she does not use  illicit drugs.   Family History:  The patient's family history includes Hypertension in her mother.    ROS:  Please see the history of present illness.   Otherwise, review of systems are positive for none.   All other systems are reviewed and negative.    PHYSICAL EXAM: VS:  BP 116/74 mmHg  Pulse 64  Ht 5' 2.5" (1.588 m)  Wt 190 lb 6.4 oz (86.365 kg)  BMI 34.25 kg/m2 , BMI Body mass index is 34.25 kg/(m^2). GEN: Well nourished, well developed, in no acute distress HEENT: normal Neck: no JVD, carotid bruits, or masses Cardiac: RRR; no murmurs, rubs, or gallops,no edema  Respiratory:  clear to auscultation bilaterally, normal work of breathing GI: soft, nontender, nondistended, + BS MS: no deformity or atrophy Skin: warm and dry, no rash Neuro:  Strength and sensation are intact Psych: euthymic mood, full affect   EKG:  EKG is ordered today. The ekg ordered today demonstrates normal sinus rhythm at 64 bpm.  Right bundle branch block.  LVH with QRS widening and repolarization abnormality.  Since previous tracing of 05/09/14, no significant change   Recent Labs: 10/10/2015: ALT 33*; BUN 20; Creat 0.89; Potassium 4.3; Sodium 140; TSH 1.339    Lipid Panel    Component Value Date/Time   CHOL 187 10/10/2015 0933   TRIG 315* 10/10/2015 0933   HDL 36* 10/10/2015 0933   CHOLHDL 5.2* 10/10/2015 0933   VLDL 63* 10/10/2015 0933   LDLCALC 88 10/10/2015 0933   LDLDIRECT 118.0 11/09/2014 0853      Wt Readings from Last 3 Encounters:  10/10/15 190 lb 6.4 oz (86.365 kg)  11/09/14 187 lb (84.823 kg)  07/24/14 185 lb (83.915 kg)         ASSESSMENT AND PLAN:  1. chest pain with typical and atypical features, resolved since last visit. Likely scan Myoview on 05/26/14 showed no ischemia and no wall motion abnormalities and her ejection fraction was 66%. 2. Hypercholesterolemia 3. old right bundle branch block 4. Hypothyroidism 5. complex psychiatric disorder comprised of OCD,  bipolar syndrome, borderline schizophrenia, and early Alzheimer's. 6. Worsening osteoarthritis of left knee. She suspects she may need to have knee replacement soon.  Plan: Continue current medication. Recheck in 4 months for office visit lipid panel hepatic function panel and basal metabolic panel.   Current medicines are reviewed at length with the patient today.  The patient does not have concerns regarding medicines.  The following changes have been made:  no change  Labs/ tests  ordered today include:   Orders Placed This Encounter  Procedures  . Lipid panel  . Hepatic function panel  . Basic metabolic panel  . TSH  . T4, free  . EKG 12-Lead    Disposition: We are checking lab work today including lipid panel hepatic function panel nasal metabolic panel TSH and free T4.  She will continue current medication.  Work on weight loss.  Recheck 6 months for office visit and EKG   Signed, Darlin Coco MD 10/10/2015 6:25 PM    [facility] [place], [place],   [address] Phone: [phone]; Fax: [phone]

## 2015-10-10 NOTE — Progress Notes (Signed)
Quick Note:  Please report to patient. The recent labs are stable. Continue same medication and careful diet. Thyroid levels are good. ______

## 2015-10-10 NOTE — Patient Instructions (Signed)
Medication Instructions:  Your physician recommends that you continue on your current medications as directed. Please refer to the Current Medication list given to you today.  Labwork: Lp/bmet/hfp/tsh/ft4  Testing/Procedures: none  Follow-Up: Your physician recommends that you schedule a follow-up appointment in: 6 month ov/ekg with Tera Helper NP or Brynda Rim PA  If you need a refill on your cardiac medications before your next appointment, please call your pharmacy.

## 2015-10-11 ENCOUNTER — Telehealth: Payer: Self-pay | Admitting: Cardiology

## 2015-10-11 NOTE — Telephone Encounter (Signed)
New Message   Pt called for results of blood work

## 2015-10-11 NOTE — Telephone Encounter (Signed)
-----   Message from Darlin Coco, MD sent at 10/10/2015  9:00 PM EDT ----- Please report to patient.  The recent labs are stable. Continue same medication and careful diet. Thyroid levels are good.

## 2015-10-11 NOTE — Telephone Encounter (Signed)
No answer

## 2015-10-12 NOTE — Telephone Encounter (Signed)
Notes Recorded by Earvin Hansen on 10/11/2015 at 3:07 PM Advised patient of lab results

## 2015-10-22 ENCOUNTER — Other Ambulatory Visit: Payer: Self-pay | Admitting: Cardiology

## 2015-10-29 DIAGNOSIS — M545 Low back pain: Secondary | ICD-10-CM | POA: Diagnosis not present

## 2015-10-29 DIAGNOSIS — M1712 Unilateral primary osteoarthritis, left knee: Secondary | ICD-10-CM | POA: Diagnosis not present

## 2015-11-19 ENCOUNTER — Other Ambulatory Visit: Payer: Self-pay | Admitting: Cardiology

## 2015-11-21 DIAGNOSIS — R1310 Dysphagia, unspecified: Secondary | ICD-10-CM | POA: Diagnosis not present

## 2015-11-23 ENCOUNTER — Other Ambulatory Visit: Payer: Self-pay | Admitting: Gastroenterology

## 2015-11-23 DIAGNOSIS — R1310 Dysphagia, unspecified: Secondary | ICD-10-CM

## 2015-11-29 ENCOUNTER — Ambulatory Visit
Admission: RE | Admit: 2015-11-29 | Discharge: 2015-11-29 | Disposition: A | Discharge status: Home / Self Care | Payer: Commercial Managed Care - HMO | Source: Ambulatory Visit | Attending: Gastroenterology | Admitting: Gastroenterology

## 2015-11-29 DIAGNOSIS — R1310 Dysphagia, unspecified: Secondary | ICD-10-CM

## 2015-11-29 DIAGNOSIS — K219 Gastro-esophageal reflux disease without esophagitis: Secondary | ICD-10-CM | POA: Diagnosis not present

## 2015-12-07 ENCOUNTER — Other Ambulatory Visit (HOSPITAL_COMMUNITY): Payer: Self-pay | Admitting: Gastroenterology

## 2015-12-07 DIAGNOSIS — R1310 Dysphagia, unspecified: Secondary | ICD-10-CM

## 2015-12-13 ENCOUNTER — Ambulatory Visit (HOSPITAL_COMMUNITY)
Admission: RE | Admit: 2015-12-13 | Discharge: 2015-12-13 | Disposition: A | Discharge status: Home / Self Care | Payer: Commercial Managed Care - HMO | Source: Ambulatory Visit | Attending: Gastroenterology | Admitting: Gastroenterology

## 2015-12-13 DIAGNOSIS — R1310 Dysphagia, unspecified: Secondary | ICD-10-CM | POA: Diagnosis not present

## 2015-12-13 DIAGNOSIS — K219 Gastro-esophageal reflux disease without esophagitis: Secondary | ICD-10-CM | POA: Diagnosis not present

## 2015-12-27 DIAGNOSIS — M1712 Unilateral primary osteoarthritis, left knee: Secondary | ICD-10-CM | POA: Diagnosis not present

## 2015-12-31 ENCOUNTER — Other Ambulatory Visit: Payer: Self-pay | Admitting: Cardiology

## 2016-01-03 DIAGNOSIS — M1712 Unilateral primary osteoarthritis, left knee: Secondary | ICD-10-CM | POA: Diagnosis not present

## 2016-01-07 ENCOUNTER — Other Ambulatory Visit: Payer: Self-pay | Admitting: Cardiology

## 2016-01-10 DIAGNOSIS — M1712 Unilateral primary osteoarthritis, left knee: Secondary | ICD-10-CM | POA: Diagnosis not present

## 2016-01-23 DIAGNOSIS — G301 Alzheimer's disease with late onset: Secondary | ICD-10-CM | POA: Diagnosis not present

## 2016-01-23 DIAGNOSIS — R1310 Dysphagia, unspecified: Secondary | ICD-10-CM | POA: Diagnosis not present

## 2016-01-23 DIAGNOSIS — Z0000 Encounter for general adult medical examination without abnormal findings: Secondary | ICD-10-CM | POA: Diagnosis not present

## 2016-01-23 DIAGNOSIS — M25562 Pain in left knee: Secondary | ICD-10-CM | POA: Diagnosis not present

## 2016-01-23 DIAGNOSIS — F314 Bipolar disorder, current episode depressed, severe, without psychotic features: Secondary | ICD-10-CM | POA: Diagnosis not present

## 2016-01-23 DIAGNOSIS — F259 Schizoaffective disorder, unspecified: Secondary | ICD-10-CM | POA: Diagnosis not present

## 2016-01-23 DIAGNOSIS — I10 Essential (primary) hypertension: Secondary | ICD-10-CM | POA: Diagnosis not present

## 2016-01-23 DIAGNOSIS — R350 Frequency of micturition: Secondary | ICD-10-CM | POA: Diagnosis not present

## 2016-01-23 DIAGNOSIS — E7800 Pure hypercholesterolemia, unspecified: Secondary | ICD-10-CM | POA: Diagnosis not present

## 2016-01-25 ENCOUNTER — Ambulatory Visit: Payer: Commercial Managed Care - HMO | Admitting: Internal Medicine

## 2016-02-08 DIAGNOSIS — R1310 Dysphagia, unspecified: Secondary | ICD-10-CM | POA: Diagnosis not present

## 2016-02-21 DIAGNOSIS — M25562 Pain in left knee: Secondary | ICD-10-CM | POA: Diagnosis not present

## 2016-02-21 DIAGNOSIS — M1712 Unilateral primary osteoarthritis, left knee: Secondary | ICD-10-CM | POA: Diagnosis not present

## 2016-02-28 DIAGNOSIS — Z961 Presence of intraocular lens: Secondary | ICD-10-CM | POA: Diagnosis not present

## 2016-02-28 DIAGNOSIS — H35373 Puckering of macula, bilateral: Secondary | ICD-10-CM | POA: Diagnosis not present

## 2016-02-28 DIAGNOSIS — H1851 Endothelial corneal dystrophy: Secondary | ICD-10-CM | POA: Diagnosis not present

## 2016-02-28 DIAGNOSIS — H353112 Nonexudative age-related macular degeneration, right eye, intermediate dry stage: Secondary | ICD-10-CM | POA: Diagnosis not present

## 2016-02-28 DIAGNOSIS — H40013 Open angle with borderline findings, low risk, bilateral: Secondary | ICD-10-CM | POA: Diagnosis not present

## 2016-03-27 DIAGNOSIS — C50911 Malignant neoplasm of unspecified site of right female breast: Secondary | ICD-10-CM | POA: Diagnosis not present

## 2016-03-27 DIAGNOSIS — Z01419 Encounter for gynecological examination (general) (routine) without abnormal findings: Secondary | ICD-10-CM | POA: Diagnosis not present

## 2016-03-27 DIAGNOSIS — Z1211 Encounter for screening for malignant neoplasm of colon: Secondary | ICD-10-CM | POA: Diagnosis not present

## 2016-03-27 DIAGNOSIS — Z6834 Body mass index (BMI) 34.0-34.9, adult: Secondary | ICD-10-CM | POA: Diagnosis not present

## 2016-03-27 DIAGNOSIS — M8580 Other specified disorders of bone density and structure, unspecified site: Secondary | ICD-10-CM | POA: Diagnosis not present

## 2016-04-07 DIAGNOSIS — M1712 Unilateral primary osteoarthritis, left knee: Secondary | ICD-10-CM | POA: Diagnosis not present

## 2016-04-07 DIAGNOSIS — M545 Low back pain: Secondary | ICD-10-CM | POA: Diagnosis not present

## 2016-04-07 DIAGNOSIS — S4981XA Other specified injuries of right shoulder and upper arm, initial encounter: Secondary | ICD-10-CM | POA: Diagnosis not present

## 2016-04-08 ENCOUNTER — Ambulatory Visit: Payer: Commercial Managed Care - HMO | Admitting: Nurse Practitioner

## 2016-04-21 ENCOUNTER — Other Ambulatory Visit: Payer: Self-pay | Admitting: *Deleted

## 2016-04-21 MED ORDER — FENOFIBRATE 145 MG PO TABS: ORAL_TABLET | ORAL | Status: DC

## 2016-04-25 DIAGNOSIS — M6281 Muscle weakness (generalized): Secondary | ICD-10-CM | POA: Diagnosis not present

## 2016-04-25 DIAGNOSIS — R2681 Unsteadiness on feet: Secondary | ICD-10-CM | POA: Diagnosis not present

## 2016-04-25 DIAGNOSIS — M545 Low back pain: Secondary | ICD-10-CM | POA: Diagnosis not present

## 2016-04-25 DIAGNOSIS — Z9181 History of falling: Secondary | ICD-10-CM | POA: Diagnosis not present

## 2016-04-29 DIAGNOSIS — M6281 Muscle weakness (generalized): Secondary | ICD-10-CM | POA: Diagnosis not present

## 2016-04-29 DIAGNOSIS — M545 Low back pain: Secondary | ICD-10-CM | POA: Diagnosis not present

## 2016-04-29 DIAGNOSIS — R2681 Unsteadiness on feet: Secondary | ICD-10-CM | POA: Diagnosis not present

## 2016-04-29 DIAGNOSIS — Z9181 History of falling: Secondary | ICD-10-CM | POA: Diagnosis not present

## 2016-04-30 ENCOUNTER — Encounter: Payer: Self-pay | Admitting: Nurse Practitioner

## 2016-04-30 ENCOUNTER — Ambulatory Visit (INDEPENDENT_AMBULATORY_CARE_PROVIDER_SITE_OTHER): Payer: Commercial Managed Care - HMO | Admitting: Nurse Practitioner

## 2016-04-30 VITALS — BP 106/60 | HR 64 | Ht 62.5 in | Wt 188.8 lb

## 2016-04-30 DIAGNOSIS — E7800 Pure hypercholesterolemia, unspecified: Secondary | ICD-10-CM | POA: Diagnosis not present

## 2016-04-30 DIAGNOSIS — R2681 Unsteadiness on feet: Secondary | ICD-10-CM | POA: Diagnosis not present

## 2016-04-30 DIAGNOSIS — M6281 Muscle weakness (generalized): Secondary | ICD-10-CM | POA: Diagnosis not present

## 2016-04-30 DIAGNOSIS — Z9181 History of falling: Secondary | ICD-10-CM | POA: Diagnosis not present

## 2016-04-30 DIAGNOSIS — I119 Hypertensive heart disease without heart failure: Secondary | ICD-10-CM

## 2016-04-30 DIAGNOSIS — M545 Low back pain: Secondary | ICD-10-CM | POA: Diagnosis not present

## 2016-04-30 NOTE — Patient Instructions (Addendum)
We will be checking the following labs today - NONE   Medication Instructions:    Continue with your current medicines.     Testing/Procedures To Be Arranged:  N/A  Follow-Up:   See Dr. Oval Linsey in 6 months.    Other Special Instructions:   N/A    If you need a refill on your cardiac medications before your next appointment, please call your pharmacy.   Call the [place] office at [phone] if you have any questions, problems or concerns.

## 2016-04-30 NOTE — Progress Notes (Signed)
CARDIOLOGY OFFICE NOTE  Date:  04/30/2016    [name] Date of Birth: [birth year] Medical Record [id number]  PCP:  Mathews Argyle, MD  Cardiologist:  Former patient of Dr. Sherryl Barters.     Chief Complaint  Patient presents with  . Hyperlipidemia  . Hypertension    6 month check - former patient of Dr. Sherryl Barters    History of Present Illness: [name] is a 75 y.o. female who presents today for a 6 month check. Former patient of Dr. Sherryl Barters.   She has a history of right BBB, HTN and HLD. She does not have any history of known ischemic heart disease. We did a Myoview on 05/26/14 which showed no evidence of ischemia and her ejection fraction was 66%.  She has a past history of breast cancer followed by Dr. Jana Hakim.  She also has a complex psychiatric diagnosis including OCD, bipolar syndrome, borderline schizophrenia, and early Alzheimer's.   Last seen in November and was felt to be doing well.  Comes back today. Here alone. Lots of medical issues but no cardiac issues. Has trouble swallowing. Has fallen and hurt her back about a month ago - getting PT. No chest pain. Not short of breath. BP ok.  She does have issues with anxiety/depression - her best friend died last month and she has had lots of grief.   Past Medical History  Diagnosis Date  . Bipolar 1 disorder ([place])   . Schizo-affective schizophrenia ([place])   . Anxiety   . Morbid obesity ([place])   . Hypertension   . Hyperlipidemia   . Osteoarthritis   . Arrhythmia     right bundle branch block  . Thyroid disease     hypothyroidism  . Cancer [facility])     Past Surgical History  Procedure Laterality Date  . Knee surgery      Left  . Breast surgery    . Hand surgery      Right-trigger finger  . Cholecystectomy    . Tonsillectomy    . Dilation and curettage of uterus       Medications: Current Outpatient Prescriptions  Medication Sig Dispense Refill  . ALPRAZolam (XANAX) 0.5 MG tablet Take  0.5-2 mg by mouth 4 (four) times daily as needed for anxiety. Patient may take 3-4 tablets by mouth as needed at bedtime for sleep and 1-3 tablets by mouth three times daily as needed for anxiety    . aspirin 81 MG tablet Take 81 mg by mouth daily.    . Calcium Carbonate (CALCIUM 600 PO) Take 1 tablet by mouth daily.    Marland Kitchen co-enzyme Q-10 30 MG capsule Take 30 mg by mouth daily.    Marland Kitchen donepezil (ARICEPT) 10 MG tablet Take 10 mg by mouth every morning.     . fenofibrate (TRICOR) 145 MG tablet Take 145 mg by mouth every Tuesday, Thursday, and Saturday at 6 PM. 15 tablet 0  . ibuprofen (ADVIL,MOTRIN) 200 MG tablet Take 400 mg by mouth 2 (two) times daily.    Marland Kitchen lamoTRIgine (LAMICTAL) 150 MG tablet Take 150 mg by mouth 2 (two) times daily.  12  . memantine (NAMENDA) 10 MG tablet Take 10 mg by mouth 2 (two) times daily.      . metoprolol tartrate (LOPRESSOR) 25 MG tablet TAKE 1 TABLET (25 MG TOTAL) BY MOUTH DAILY. 30 tablet 8  . Multiple Vitamins-Minerals (MULTIVITAMIN ADULT PO) Take 1 tablet by mouth daily.    Marland Kitchen OLANZapine (  ZYPREXA) 15 MG tablet Take 30 mg by mouth at bedtime.      Marland Kitchen omeprazole (PRILOSEC) 40 MG capsule 1 CAPSULE ONCE A DAY ORALLY 30 DAYS  5  . QUEtiapine (SEROQUEL) 25 MG tablet Take 50 mg by mouth at bedtime.     . ramelteon (ROZEREM) 8 MG tablet Take 8 mg by mouth at bedtime.      Marland Kitchen spironolactone (ALDACTONE) 25 MG tablet TAKE 1 TABLET (25 MG TOTAL) BY MOUTH DAILY. 30 tablet 3  . SYNTHROID 150 MCG tablet TAKE 1 TABLET (150 MCG TOTAL) BY MOUTH DAILY. 30 tablet 6   No current facility-administered medications for this visit.    Allergies: Allergies  Allergen Reactions  . Macrodantin Other (See Comments)    unknown  . Paxil [Paroxetine] Other (See Comments)    Made pt feel crazy   . Prozac [Fluoxetine Hcl] Other (See Comments)    Pt felt crazy   . Sertraline Hcl   . Sulfa Antibiotics Other (See Comments)    unknown  . Wellbutrin [Bupropion Hcl] Other (See Comments)     unknown    Social History: The patient  reports that she quit smoking about 37 years ago. She has never used smokeless tobacco. She reports that she drinks alcohol. She reports that she does not use illicit drugs.   Family History: The patient's family history includes Hypertension in her mother.   Review of Systems: Please see the history of present illness.   Otherwise, the review of systems is positive for none.   All other systems are reviewed and negative.   Physical Exam: VS:  BP 106/60 mmHg  Pulse 64  Ht 5' 2.5" (1.588 m)  Wt 188 lb 12.8 oz (85.639 kg)  BMI 33.96 kg/m2 .  BMI Body mass index is 33.96 kg/(m^2).  Wt Readings from Last 3 Encounters:  04/30/16 188 lb 12.8 oz (85.639 kg)  10/10/15 190 lb 6.4 oz (86.365 kg)  11/09/14 187 lb (84.823 kg)    General: Elderly female. Flat affect but she is alert and in no acute distress.  HEENT: Normal. Neck: Supple, no JVD, carotid bruits, or masses noted.  Cardiac: Regular rate and rhythm. No murmurs, rubs, or gallops. No edema.  Respiratory:  Lungs are clear to auscultation bilaterally with normal work of breathing.  GI: Soft and nontender.  MS: No deformity or atrophy. Gait and ROM intact. Skin: Warm and dry. Color is normal.  Neuro:  Strength and sensation are intact and no gross focal deficits noted.  Psych: Alert, appropriate and with normal affect.   LABORATORY DATA:  EKG:  EKG is not ordered today.  Lab Results  Component Value Date   WBC 5.5 05/11/2014   HGB 13.1 05/11/2014   HCT 39.1 05/11/2014   PLT 250 05/11/2014   GLUCOSE 93 10/10/2015   CHOL 187 10/10/2015   TRIG 315* 10/10/2015   HDL 36* 10/10/2015   LDLDIRECT 118.0 11/09/2014   LDLCALC 88 10/10/2015   ALT 33* 10/10/2015   AST 26 10/10/2015   NA 140 10/10/2015   K 4.3 10/10/2015   CL 104 10/10/2015   CREATININE 0.89 10/10/2015   BUN 20 10/10/2015   CO2 27 10/10/2015   TSH 1.339 10/10/2015   INR 0.97 12/28/2010    BNP (last 3 results) No  results for input(s): BNP in the last 8760 hours.  ProBNP (last 3 results) No results for input(s): PROBNP in the last 8760 hours.   Other Studies Reviewed Today:  Myoview Impression from 05/2014 Exercise Capacity: Lexiscan with no exercise. BP Response: Normal blood pressure response. Clinical Symptoms: Shortness of breath ECG Impression: No significant ST segment change suggestive of ischemia. Comparison with Prior Nuclear Study: No significant change from previous study  Overall Impression: Normal stress nuclear study. This is a low risk scan. There is no scar or ischemia. There is no change from the study of July, 2008  LV Ejection Fraction: 66%. LV Wall Motion: Normal Wall Motion.  Dola Argyle, MD   Assessment/Plan: 1. HTN - BP ok on current regimen.  2. HLD - on statin - states she has had recent labs with PCP  3. Extensive psych history  Current medicines are reviewed with the patient today.  The patient does not have concerns regarding medicines other than what has been noted above.  The following changes have been made:  See above.  Labs/ tests ordered today include:   No orders of the defined types were placed in this encounter.     Disposition:   FU with Dr. Oval Linsey in 6 months per patient request.   Patient is agreeable to this plan and will call if any problems develop in the interim.   Signed: Burtis Junes, RN, ANP-C 04/30/2016 8:47 AM  [facility] [address] [place], [place]  [address] Phone: [phone] Fax: [phone]

## 2016-05-02 DIAGNOSIS — R2681 Unsteadiness on feet: Secondary | ICD-10-CM | POA: Diagnosis not present

## 2016-05-02 DIAGNOSIS — Z9181 History of falling: Secondary | ICD-10-CM | POA: Diagnosis not present

## 2016-05-02 DIAGNOSIS — M6281 Muscle weakness (generalized): Secondary | ICD-10-CM | POA: Diagnosis not present

## 2016-05-02 DIAGNOSIS — M545 Low back pain: Secondary | ICD-10-CM | POA: Diagnosis not present

## 2016-05-06 ENCOUNTER — Other Ambulatory Visit: Payer: Self-pay | Admitting: *Deleted

## 2016-05-06 MED ORDER — SPIRONOLACTONE 25 MG PO TABS: ORAL_TABLET | ORAL | Status: DC

## 2016-05-07 DIAGNOSIS — M6281 Muscle weakness (generalized): Secondary | ICD-10-CM | POA: Diagnosis not present

## 2016-05-07 DIAGNOSIS — Z9181 History of falling: Secondary | ICD-10-CM | POA: Diagnosis not present

## 2016-05-07 DIAGNOSIS — M545 Low back pain: Secondary | ICD-10-CM | POA: Diagnosis not present

## 2016-05-07 DIAGNOSIS — R2681 Unsteadiness on feet: Secondary | ICD-10-CM | POA: Diagnosis not present

## 2016-05-08 DIAGNOSIS — R2681 Unsteadiness on feet: Secondary | ICD-10-CM | POA: Diagnosis not present

## 2016-05-08 DIAGNOSIS — M6281 Muscle weakness (generalized): Secondary | ICD-10-CM | POA: Diagnosis not present

## 2016-05-08 DIAGNOSIS — M545 Low back pain: Secondary | ICD-10-CM | POA: Diagnosis not present

## 2016-05-08 DIAGNOSIS — Z9181 History of falling: Secondary | ICD-10-CM | POA: Diagnosis not present

## 2016-05-12 DIAGNOSIS — R2681 Unsteadiness on feet: Secondary | ICD-10-CM | POA: Diagnosis not present

## 2016-05-12 DIAGNOSIS — Z9181 History of falling: Secondary | ICD-10-CM | POA: Diagnosis not present

## 2016-05-12 DIAGNOSIS — M545 Low back pain: Secondary | ICD-10-CM | POA: Diagnosis not present

## 2016-05-12 DIAGNOSIS — M6281 Muscle weakness (generalized): Secondary | ICD-10-CM | POA: Diagnosis not present

## 2016-05-13 DIAGNOSIS — M6281 Muscle weakness (generalized): Secondary | ICD-10-CM | POA: Diagnosis not present

## 2016-05-13 DIAGNOSIS — M545 Low back pain: Secondary | ICD-10-CM | POA: Diagnosis not present

## 2016-05-13 DIAGNOSIS — R2681 Unsteadiness on feet: Secondary | ICD-10-CM | POA: Diagnosis not present

## 2016-05-13 DIAGNOSIS — Z9181 History of falling: Secondary | ICD-10-CM | POA: Diagnosis not present

## 2016-05-15 DIAGNOSIS — M545 Low back pain: Secondary | ICD-10-CM | POA: Diagnosis not present

## 2016-05-15 DIAGNOSIS — Z9181 History of falling: Secondary | ICD-10-CM | POA: Diagnosis not present

## 2016-05-15 DIAGNOSIS — M6281 Muscle weakness (generalized): Secondary | ICD-10-CM | POA: Diagnosis not present

## 2016-05-15 DIAGNOSIS — R2681 Unsteadiness on feet: Secondary | ICD-10-CM | POA: Diagnosis not present

## 2016-05-19 ENCOUNTER — Other Ambulatory Visit: Payer: Self-pay

## 2016-05-19 NOTE — Telephone Encounter (Signed)
Hi Ladies, I am routing this refill encounter to you both due to the fact that this patient seen Cecille Rubin last and wants her synthroid refilled. However I dont know if Cecille Rubin is willing to do that or not. She is a patient of Dr. Mare Ferrari and she is due to see him in about 6 months. I dont know if Dr. Oval Linsey will be willing to refill or not either. Please let me know something as soon as you can. Love y'all and thank you for your time.

## 2016-05-21 DIAGNOSIS — R2681 Unsteadiness on feet: Secondary | ICD-10-CM | POA: Diagnosis not present

## 2016-05-21 DIAGNOSIS — M6281 Muscle weakness (generalized): Secondary | ICD-10-CM | POA: Diagnosis not present

## 2016-05-21 DIAGNOSIS — M545 Low back pain: Secondary | ICD-10-CM | POA: Diagnosis not present

## 2016-05-21 DIAGNOSIS — Z9181 History of falling: Secondary | ICD-10-CM | POA: Diagnosis not present

## 2016-05-22 DIAGNOSIS — Z1231 Encounter for screening mammogram for malignant neoplasm of breast: Secondary | ICD-10-CM | POA: Diagnosis not present

## 2016-05-22 DIAGNOSIS — Z780 Asymptomatic menopausal state: Secondary | ICD-10-CM | POA: Diagnosis not present

## 2016-05-22 DIAGNOSIS — N63 Unspecified lump in breast: Secondary | ICD-10-CM | POA: Diagnosis not present

## 2016-05-23 DIAGNOSIS — Z9181 History of falling: Secondary | ICD-10-CM | POA: Diagnosis not present

## 2016-05-23 DIAGNOSIS — R2681 Unsteadiness on feet: Secondary | ICD-10-CM | POA: Diagnosis not present

## 2016-05-23 DIAGNOSIS — M6281 Muscle weakness (generalized): Secondary | ICD-10-CM | POA: Diagnosis not present

## 2016-05-23 DIAGNOSIS — M545 Low back pain: Secondary | ICD-10-CM | POA: Diagnosis not present

## 2016-05-26 ENCOUNTER — Other Ambulatory Visit: Payer: Self-pay | Admitting: Nurse Practitioner

## 2016-05-26 ENCOUNTER — Other Ambulatory Visit: Payer: Self-pay | Admitting: Radiology

## 2016-05-26 DIAGNOSIS — N641 Fat necrosis of breast: Secondary | ICD-10-CM | POA: Diagnosis not present

## 2016-05-26 DIAGNOSIS — N63 Unspecified lump in breast: Secondary | ICD-10-CM | POA: Diagnosis not present

## 2016-05-28 DIAGNOSIS — Z9181 History of falling: Secondary | ICD-10-CM | POA: Diagnosis not present

## 2016-05-28 DIAGNOSIS — M6281 Muscle weakness (generalized): Secondary | ICD-10-CM | POA: Diagnosis not present

## 2016-05-28 DIAGNOSIS — M545 Low back pain: Secondary | ICD-10-CM | POA: Diagnosis not present

## 2016-05-28 DIAGNOSIS — R2681 Unsteadiness on feet: Secondary | ICD-10-CM | POA: Diagnosis not present

## 2016-05-28 NOTE — Telephone Encounter (Signed)
Patient called no answer.Left message on personal voice mail I received a message about refilling your Synthroid.Advised to call me back.

## 2016-05-30 DIAGNOSIS — M6281 Muscle weakness (generalized): Secondary | ICD-10-CM | POA: Diagnosis not present

## 2016-05-30 DIAGNOSIS — R2681 Unsteadiness on feet: Secondary | ICD-10-CM | POA: Diagnosis not present

## 2016-05-30 DIAGNOSIS — Z9181 History of falling: Secondary | ICD-10-CM | POA: Diagnosis not present

## 2016-05-30 DIAGNOSIS — M545 Low back pain: Secondary | ICD-10-CM | POA: Diagnosis not present

## 2016-06-04 DIAGNOSIS — R2681 Unsteadiness on feet: Secondary | ICD-10-CM | POA: Diagnosis not present

## 2016-06-04 DIAGNOSIS — M545 Low back pain: Secondary | ICD-10-CM | POA: Diagnosis not present

## 2016-06-04 DIAGNOSIS — Z9181 History of falling: Secondary | ICD-10-CM | POA: Diagnosis not present

## 2016-06-04 DIAGNOSIS — M6281 Muscle weakness (generalized): Secondary | ICD-10-CM | POA: Diagnosis not present

## 2016-06-06 DIAGNOSIS — M6281 Muscle weakness (generalized): Secondary | ICD-10-CM | POA: Diagnosis not present

## 2016-06-06 DIAGNOSIS — M545 Low back pain: Secondary | ICD-10-CM | POA: Diagnosis not present

## 2016-06-06 DIAGNOSIS — Z9181 History of falling: Secondary | ICD-10-CM | POA: Diagnosis not present

## 2016-06-06 DIAGNOSIS — R2681 Unsteadiness on feet: Secondary | ICD-10-CM | POA: Diagnosis not present

## 2016-06-06 NOTE — Telephone Encounter (Signed)
Patient called no answer.She never returned call about refilling Synthroid.

## 2016-06-11 DIAGNOSIS — M6281 Muscle weakness (generalized): Secondary | ICD-10-CM | POA: Diagnosis not present

## 2016-06-11 DIAGNOSIS — Z9181 History of falling: Secondary | ICD-10-CM | POA: Diagnosis not present

## 2016-06-11 DIAGNOSIS — R2681 Unsteadiness on feet: Secondary | ICD-10-CM | POA: Diagnosis not present

## 2016-06-11 DIAGNOSIS — M545 Low back pain: Secondary | ICD-10-CM | POA: Diagnosis not present

## 2016-06-13 DIAGNOSIS — Z9181 History of falling: Secondary | ICD-10-CM | POA: Diagnosis not present

## 2016-06-13 DIAGNOSIS — M6281 Muscle weakness (generalized): Secondary | ICD-10-CM | POA: Diagnosis not present

## 2016-06-13 DIAGNOSIS — M545 Low back pain: Secondary | ICD-10-CM | POA: Diagnosis not present

## 2016-06-13 DIAGNOSIS — R2681 Unsteadiness on feet: Secondary | ICD-10-CM | POA: Diagnosis not present

## 2016-06-17 DIAGNOSIS — M6281 Muscle weakness (generalized): Secondary | ICD-10-CM | POA: Diagnosis not present

## 2016-06-17 DIAGNOSIS — M545 Low back pain: Secondary | ICD-10-CM | POA: Diagnosis not present

## 2016-06-17 DIAGNOSIS — Z9181 History of falling: Secondary | ICD-10-CM | POA: Diagnosis not present

## 2016-06-17 DIAGNOSIS — R2681 Unsteadiness on feet: Secondary | ICD-10-CM | POA: Diagnosis not present

## 2016-06-20 DIAGNOSIS — M6281 Muscle weakness (generalized): Secondary | ICD-10-CM | POA: Diagnosis not present

## 2016-06-20 DIAGNOSIS — R2681 Unsteadiness on feet: Secondary | ICD-10-CM | POA: Diagnosis not present

## 2016-06-20 DIAGNOSIS — M545 Low back pain: Secondary | ICD-10-CM | POA: Diagnosis not present

## 2016-06-20 DIAGNOSIS — Z9181 History of falling: Secondary | ICD-10-CM | POA: Diagnosis not present

## 2016-06-24 DIAGNOSIS — M545 Low back pain: Secondary | ICD-10-CM | POA: Diagnosis not present

## 2016-06-24 DIAGNOSIS — Z9181 History of falling: Secondary | ICD-10-CM | POA: Diagnosis not present

## 2016-06-24 DIAGNOSIS — R2681 Unsteadiness on feet: Secondary | ICD-10-CM | POA: Diagnosis not present

## 2016-06-24 DIAGNOSIS — M6281 Muscle weakness (generalized): Secondary | ICD-10-CM | POA: Diagnosis not present

## 2016-06-27 DIAGNOSIS — R2681 Unsteadiness on feet: Secondary | ICD-10-CM | POA: Diagnosis not present

## 2016-06-27 DIAGNOSIS — M6281 Muscle weakness (generalized): Secondary | ICD-10-CM | POA: Diagnosis not present

## 2016-06-27 DIAGNOSIS — M545 Low back pain: Secondary | ICD-10-CM | POA: Diagnosis not present

## 2016-06-27 DIAGNOSIS — Z9181 History of falling: Secondary | ICD-10-CM | POA: Diagnosis not present

## 2016-07-01 DIAGNOSIS — R2681 Unsteadiness on feet: Secondary | ICD-10-CM | POA: Diagnosis not present

## 2016-07-01 DIAGNOSIS — M545 Low back pain: Secondary | ICD-10-CM | POA: Diagnosis not present

## 2016-07-01 DIAGNOSIS — Z9181 History of falling: Secondary | ICD-10-CM | POA: Diagnosis not present

## 2016-07-01 DIAGNOSIS — M6281 Muscle weakness (generalized): Secondary | ICD-10-CM | POA: Diagnosis not present

## 2016-07-03 DIAGNOSIS — M1712 Unilateral primary osteoarthritis, left knee: Secondary | ICD-10-CM | POA: Diagnosis not present

## 2016-07-04 DIAGNOSIS — Z9181 History of falling: Secondary | ICD-10-CM | POA: Diagnosis not present

## 2016-07-04 DIAGNOSIS — M545 Low back pain: Secondary | ICD-10-CM | POA: Diagnosis not present

## 2016-07-04 DIAGNOSIS — R2681 Unsteadiness on feet: Secondary | ICD-10-CM | POA: Diagnosis not present

## 2016-07-04 DIAGNOSIS — M6281 Muscle weakness (generalized): Secondary | ICD-10-CM | POA: Diagnosis not present

## 2016-07-08 DIAGNOSIS — R2681 Unsteadiness on feet: Secondary | ICD-10-CM | POA: Diagnosis not present

## 2016-07-08 DIAGNOSIS — M6281 Muscle weakness (generalized): Secondary | ICD-10-CM | POA: Diagnosis not present

## 2016-07-08 DIAGNOSIS — Z9181 History of falling: Secondary | ICD-10-CM | POA: Diagnosis not present

## 2016-07-08 DIAGNOSIS — M545 Low back pain: Secondary | ICD-10-CM | POA: Diagnosis not present

## 2016-07-11 DIAGNOSIS — Z9181 History of falling: Secondary | ICD-10-CM | POA: Diagnosis not present

## 2016-07-11 DIAGNOSIS — R2681 Unsteadiness on feet: Secondary | ICD-10-CM | POA: Diagnosis not present

## 2016-07-11 DIAGNOSIS — M6281 Muscle weakness (generalized): Secondary | ICD-10-CM | POA: Diagnosis not present

## 2016-07-11 DIAGNOSIS — M545 Low back pain: Secondary | ICD-10-CM | POA: Diagnosis not present

## 2016-07-15 DIAGNOSIS — M545 Low back pain: Secondary | ICD-10-CM | POA: Diagnosis not present

## 2016-07-15 DIAGNOSIS — Z9181 History of falling: Secondary | ICD-10-CM | POA: Diagnosis not present

## 2016-07-15 DIAGNOSIS — R2681 Unsteadiness on feet: Secondary | ICD-10-CM | POA: Diagnosis not present

## 2016-07-15 DIAGNOSIS — M6281 Muscle weakness (generalized): Secondary | ICD-10-CM | POA: Diagnosis not present

## 2016-07-17 DIAGNOSIS — R2681 Unsteadiness on feet: Secondary | ICD-10-CM | POA: Diagnosis not present

## 2016-07-17 DIAGNOSIS — M6281 Muscle weakness (generalized): Secondary | ICD-10-CM | POA: Diagnosis not present

## 2016-07-17 DIAGNOSIS — M545 Low back pain: Secondary | ICD-10-CM | POA: Diagnosis not present

## 2016-07-17 DIAGNOSIS — Z9181 History of falling: Secondary | ICD-10-CM | POA: Diagnosis not present

## 2016-07-22 DIAGNOSIS — Z9181 History of falling: Secondary | ICD-10-CM | POA: Diagnosis not present

## 2016-07-22 DIAGNOSIS — R2681 Unsteadiness on feet: Secondary | ICD-10-CM | POA: Diagnosis not present

## 2016-07-22 DIAGNOSIS — M545 Low back pain: Secondary | ICD-10-CM | POA: Diagnosis not present

## 2016-07-22 DIAGNOSIS — M6281 Muscle weakness (generalized): Secondary | ICD-10-CM | POA: Diagnosis not present

## 2016-07-29 DIAGNOSIS — M6281 Muscle weakness (generalized): Secondary | ICD-10-CM | POA: Diagnosis not present

## 2016-07-29 DIAGNOSIS — M545 Low back pain: Secondary | ICD-10-CM | POA: Diagnosis not present

## 2016-07-29 DIAGNOSIS — Z9181 History of falling: Secondary | ICD-10-CM | POA: Diagnosis not present

## 2016-07-29 DIAGNOSIS — R2681 Unsteadiness on feet: Secondary | ICD-10-CM | POA: Diagnosis not present

## 2016-08-07 DIAGNOSIS — R350 Frequency of micturition: Secondary | ICD-10-CM | POA: Diagnosis not present

## 2016-08-07 DIAGNOSIS — I10 Essential (primary) hypertension: Secondary | ICD-10-CM | POA: Diagnosis not present

## 2016-08-28 DIAGNOSIS — H04123 Dry eye syndrome of bilateral lacrimal glands: Secondary | ICD-10-CM | POA: Diagnosis not present

## 2016-08-28 DIAGNOSIS — H40013 Open angle with borderline findings, low risk, bilateral: Secondary | ICD-10-CM | POA: Diagnosis not present

## 2016-08-28 DIAGNOSIS — S00252A Superficial foreign body of left eyelid and periocular area, initial encounter: Secondary | ICD-10-CM | POA: Diagnosis not present

## 2016-08-28 DIAGNOSIS — H01003 Unspecified blepharitis right eye, unspecified eyelid: Secondary | ICD-10-CM | POA: Diagnosis not present

## 2016-08-28 DIAGNOSIS — H02403 Unspecified ptosis of bilateral eyelids: Secondary | ICD-10-CM | POA: Diagnosis not present

## 2016-09-18 DIAGNOSIS — R350 Frequency of micturition: Secondary | ICD-10-CM | POA: Diagnosis not present

## 2016-09-18 DIAGNOSIS — R351 Nocturia: Secondary | ICD-10-CM | POA: Diagnosis not present

## 2016-09-25 DIAGNOSIS — N644 Mastodynia: Secondary | ICD-10-CM | POA: Diagnosis not present

## 2016-09-29 ENCOUNTER — Other Ambulatory Visit: Payer: Self-pay | Admitting: Nurse Practitioner

## 2016-09-29 MED ORDER — METOPROLOL TARTRATE 25 MG PO TABS: ORAL_TABLET | ORAL | 7 refills | Status: DC

## 2016-10-03 DIAGNOSIS — C50911 Malignant neoplasm of unspecified site of right female breast: Secondary | ICD-10-CM | POA: Diagnosis not present

## 2016-10-09 DIAGNOSIS — M1712 Unilateral primary osteoarthritis, left knee: Secondary | ICD-10-CM | POA: Diagnosis not present

## 2016-10-09 DIAGNOSIS — M545 Low back pain: Secondary | ICD-10-CM | POA: Diagnosis not present

## 2016-10-10 DIAGNOSIS — R351 Nocturia: Secondary | ICD-10-CM | POA: Diagnosis not present

## 2016-12-18 DIAGNOSIS — R921 Mammographic calcification found on diagnostic imaging of breast: Secondary | ICD-10-CM | POA: Diagnosis not present

## 2016-12-19 DIAGNOSIS — R350 Frequency of micturition: Secondary | ICD-10-CM | POA: Diagnosis not present

## 2016-12-19 DIAGNOSIS — R351 Nocturia: Secondary | ICD-10-CM | POA: Diagnosis not present

## 2017-01-06 ENCOUNTER — Telehealth: Payer: Self-pay | Admitting: Cardiovascular Disease

## 2017-01-06 NOTE — Telephone Encounter (Signed)
Received records from [facility] for appointment on 01/09/17 with Dr Dewey.  Records put with Dr Mariaville Lake's schedule for 01/09/17. lp °

## 2017-01-09 ENCOUNTER — Encounter: Payer: Self-pay | Admitting: Cardiovascular Disease

## 2017-01-09 ENCOUNTER — Ambulatory Visit (INDEPENDENT_AMBULATORY_CARE_PROVIDER_SITE_OTHER): Payer: Medicare HMO | Admitting: Cardiovascular Disease

## 2017-01-09 VITALS — BP 146/70 | HR 74 | Ht 62.0 in | Wt 195.0 lb

## 2017-01-09 DIAGNOSIS — R011 Cardiac murmur, unspecified: Secondary | ICD-10-CM

## 2017-01-09 DIAGNOSIS — I10 Essential (primary) hypertension: Secondary | ICD-10-CM

## 2017-01-09 DIAGNOSIS — I358 Other nonrheumatic aortic valve disorders: Secondary | ICD-10-CM

## 2017-01-09 DIAGNOSIS — E7800 Pure hypercholesterolemia, unspecified: Secondary | ICD-10-CM

## 2017-01-09 NOTE — Progress Notes (Addendum)
Cardiology Office Note   Date:  01/09/2017   ID:  AZEALIA Roberts, DOB 03/03/1941, MRN [id number]  PCP:  Mathews Argyle, MD  Cardiologist:   Skeet Latch, MD   Chief Complaint  Patient presents with  . New Patient (Initial Visit)    occasional chest tightness with anxiety, has edema in ankles, occasional shortness of breath, occassional cramping in legs,  occassional lightheaded or dizziness      History of Present Illness: [name] is a 76 y.o. female with hypertension, hyperlipidemia, breast cancer, prior breast cancer, schizo-affective disorder and bipolar disorder who presents for follow up.  She was previously a patient of Dr. Mare Ferrari.  She previously had a nuclear stress 05/2014 that was negative for ischemia.  She had an echo 04/2008 that revealed normal systolic function, mild aortic sclerosis, trace mitral regurgitation and trace tricuspid regurgitation.  She last saw Truitt Merle 04/30/16 and was doing well.  She Notes long-standing shortness of breath and has been getting progressively worse. It only occurs with exertion. She denies orthopnea or PND. She also has some episodes of chest tightness that typically occur when she is feeling anxious. He feels as though someone is gripping her chest. In the past she has had lower extremity edema but it is well-controlled on spironolactone.  Ms. Pretti lives at [employer]. She has several friends there and is very happy. She exercises in the fitness programs and uses the work out room at least one hour daily.  She only has shortness of breath with the aerobics classes.  Despite her exercise she has a hard time losing weight. She attributes this to her Zyprexa.  Lately she has been struggling with increased urinary frequency.  She has been getting up 6-8 times per night to go to the bathroom.  She has been following with a urologist.  4 years ago she was diagnosed with early Alzheimer's disease. This has been  well-controlled with Namenda and Aricept.  At times she still has word finding difficulties.  She is friends with [name]    Past Medical History:  Diagnosis Date  . Anxiety   . Arrhythmia    right bundle branch block  . Bipolar 1 disorder ([place])   . Cancer ([place])   . Hyperlipidemia   . Hypertension   . Morbid obesity ([place])   . Osteoarthritis   . Schizo-affective schizophrenia ([place])   . Thyroid disease    hypothyroidism    Past Surgical History:  Procedure Laterality Date  . BREAST SURGERY    . CHOLECYSTECTOMY    . DILATION AND CURETTAGE OF UTERUS    . HAND SURGERY     Right-trigger finger  . KNEE SURGERY     Left  . TONSILLECTOMY       Current Outpatient Prescriptions  Medication Sig Dispense Refill  . ALPRAZolam (XANAX) 0.5 MG tablet Take 0.5-2 mg by mouth 4 (four) times daily as needed for anxiety. Patient may take 3-4 tablets by mouth as needed at bedtime for sleep and 1-3 tablets by mouth three times daily as needed for anxiety    . aspirin 81 MG tablet Take 81 mg by mouth daily.    . Calcium Carbonate (CALCIUM 600 PO) Take 1 tablet by mouth daily.    Marland Kitchen co-enzyme Q-10 30 MG capsule Take 30 mg by mouth daily.    Marland Kitchen donepezil (ARICEPT) 10 MG tablet Take 10 mg by mouth every morning.     . fenofibrate (  TRICOR) 145 MG tablet TAKE 145 MG BY MOUTH EVERY TUESDAY, THURSDAY, AND SATURDAY AT 6 PM. 15 tablet 11  . lamoTRIgine (LAMICTAL) 150 MG tablet Take 150 mg by mouth 2 (two) times daily.  12  . memantine (NAMENDA) 10 MG tablet Take 10 mg by mouth 2 (two) times daily.      . metoprolol tartrate (LOPRESSOR) 25 MG tablet TAKE 1 TABLET (25 MG TOTAL) BY MOUTH DAILY. 30 tablet 7  . mirabegron ER (MYRBETRIQ) 25 MG TB24 tablet Take 25 mg by mouth daily.    . Multiple Vitamins-Minerals (MULTIVITAMIN ADULT PO) Take 1 tablet by mouth daily.    . naproxen sodium (ANAPROX) 220 MG tablet Take 220 mg by mouth 2 (two) times daily with a meal.    . OLANZapine (ZYPREXA) 15 MG tablet  Take 30 mg by mouth at bedtime.      Marland Kitchen omeprazole (PRILOSEC) 40 MG capsule 1 CAPSULE ONCE A DAY ORALLY 30 DAYS  5  . QUEtiapine (SEROQUEL) 25 MG tablet Take 50 mg by mouth at bedtime.     . ramelteon (ROZEREM) 8 MG tablet Take 8 mg by mouth at bedtime.      Marland Kitchen spironolactone (ALDACTONE) 25 MG tablet TAKE 1 TABLET (25 MG TOTAL) BY MOUTH DAILY. 30 tablet 11  . SYNTHROID 150 MCG tablet TAKE 1 TABLET (150 MCG TOTAL) BY MOUTH DAILY. 30 tablet 6   No current facility-administered medications for this visit.     Allergies:   Macrodantin; Paxil [paroxetine]; Prozac [fluoxetine hcl]; Sertraline hcl; Sulfa antibiotics; and Wellbutrin [bupropion hcl]    Social History:  The patient  reports that she quit smoking about 38 years ago. She has never used smokeless tobacco. She reports that she drinks alcohol. She reports that she does not use drugs.   Family History:  The patient's family history includes Hypertension in her mother.    ROS:  Please see the history of present illness.   Otherwise, review of systems are positive for none.   All other systems are reviewed and negative.    PHYSICAL EXAM: VS:  BP (!) 146/70   Pulse 74   Ht 5\' 2"  (1.575 m)   Wt 88.5 kg (195 lb)   BMI 35.67 kg/m  , BMI Body mass index is 35.67 kg/m. GENERAL:  Well appearing HEENT:  Pupils equal round and reactive, fundi not visualized, oral mucosa unremarkable NECK:  No jugular venous distention, waveform within normal limits, carotid upstroke brisk and symmetric, no bruits, no thyromegaly LYMPHATICS:  No cervical adenopathy LUNGS:  Clear to auscultation bilaterally HEART:  RRR.  PMI not displaced or sustained,S1 and S2 within normal limits, no S3, no S4, no clicks, no rubs, II/VI systolic murmur at the left upper sternal border.  ABD:  Flat, positive bowel sounds normal in frequency in pitch, no bruits, no rebound, no guarding, no midline pulsatile mass, no hepatomegaly, no splenomegaly EXT:  2 plus pulses throughout,  no edema, no cyanosis no clubbing SKIN:  No rashes no nodules NEURO:  Cranial nerves II through XII grossly intact, motor grossly intact throughout PSYCH:  Cognitively intact, oriented to person place and time   EKG:  EKG is ordered today. The ekg ordered today demonstrates sinus rhythm.  Rate 71 bpm.  RBBB.  Lateral TWI  Lexiscan Myoview 05/26/14: LVEF 66%. No ischemia.  Recent Labs: No results found for requested labs within last 8760 hours.   08/07/16: Sodium 141, potassium 4.0, BUN 22, creatinine 0.92  Lipid Panel  Component Value Date/Time   CHOL 187 10/10/2015 0933   TRIG 315 (H) 10/10/2015 0933   HDL 36 (L) 10/10/2015 0933   CHOLHDL 5.2 (H) 10/10/2015 0933   VLDL 63 (H) 10/10/2015 0933   LDLCALC 88 10/10/2015 0933   LDLDIRECT 118.0 11/09/2014 0853      Wt Readings from Last 3 Encounters:  01/09/17 88.5 kg (195 lb)  04/30/16 85.6 kg (188 lb 12.8 oz)  10/10/15 86.4 kg (190 lb 6.4 oz)      ASSESSMENT AND PLAN:  # Aortic sclerosis: Ms. Nedley  was noted to have a systolic murmur on exam today. 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 However her last echocardiogram was in 2009. We will repeat her echo to ensure that she does not have worsening aortic stenosis given that she does have increased dyspnea on exertion.   # Hypertension:  Initial blood pressure was elevated. However on repeat was 132/78. We will not make any changes. Continue metoprolol 25 mg twice daily.   # Hyperlipidemia: Managed by Dr. Felipa Eth.   She reports that her labs were recently checked approximately 5 months ago. Continue fenofibrate.   Current medicines are reviewed at length with the patient today.  The patient does not have concerns regarding medicines.  The following changes have been made:  no change  Labs/ tests ordered today include:   Orders Placed This Encounter  Procedures  . EKG 12-Lead  . ECHOCARDIOGRAM COMPLETE     Disposition:   FU with Erich Kochan C. Oval Linsey,  MD, [facility] in 6 months.    This note was written with the assistance of speech recognition software.  Please excuse any transcriptional errors.  Signed, Tommaso Cavitt C. Oval Linsey, MD, [facility]  01/09/2017 10:18 AM    [place]

## 2017-01-09 NOTE — Patient Instructions (Signed)
Medication Instructions:  Your physician recommends that you continue on your current medications as directed. Please refer to the Current Medication list given to you today.  Labwork: none  Testing/Procedures: Your physician has requested that you have an echocardiogram. Echocardiography is a painless test that uses sound waves to create images of your heart. It provides your doctor with information about the size and shape of your heart and how well your heart's chambers and valves are working. This procedure takes approximately one hour. There are no restrictions for this procedure. [address] [facility]   Follow-Up: Your physician wants you to follow-up in: [place] will receive a reminder letter in the mail two months in advance. If you don't receive a letter, please call our office to schedule the follow-up appointment.  If you need a refill on your cardiac medications before your next appointment, please call your pharmacy.

## 2017-01-13 DIAGNOSIS — Z9181 History of falling: Secondary | ICD-10-CM | POA: Diagnosis not present

## 2017-01-13 DIAGNOSIS — R2681 Unsteadiness on feet: Secondary | ICD-10-CM | POA: Diagnosis not present

## 2017-01-13 DIAGNOSIS — M6281 Muscle weakness (generalized): Secondary | ICD-10-CM | POA: Diagnosis not present

## 2017-01-20 DIAGNOSIS — R2681 Unsteadiness on feet: Secondary | ICD-10-CM | POA: Diagnosis not present

## 2017-01-20 DIAGNOSIS — M6281 Muscle weakness (generalized): Secondary | ICD-10-CM | POA: Diagnosis not present

## 2017-01-20 DIAGNOSIS — Z9181 History of falling: Secondary | ICD-10-CM | POA: Diagnosis not present

## 2017-01-22 DIAGNOSIS — M6281 Muscle weakness (generalized): Secondary | ICD-10-CM | POA: Diagnosis not present

## 2017-01-22 DIAGNOSIS — Z9181 History of falling: Secondary | ICD-10-CM | POA: Diagnosis not present

## 2017-01-22 DIAGNOSIS — R2681 Unsteadiness on feet: Secondary | ICD-10-CM | POA: Diagnosis not present

## 2017-01-23 DIAGNOSIS — M6281 Muscle weakness (generalized): Secondary | ICD-10-CM | POA: Diagnosis not present

## 2017-01-23 DIAGNOSIS — R2681 Unsteadiness on feet: Secondary | ICD-10-CM | POA: Diagnosis not present

## 2017-01-23 DIAGNOSIS — Z9181 History of falling: Secondary | ICD-10-CM | POA: Diagnosis not present

## 2017-01-27 DIAGNOSIS — Z9181 History of falling: Secondary | ICD-10-CM | POA: Diagnosis not present

## 2017-01-27 DIAGNOSIS — R2681 Unsteadiness on feet: Secondary | ICD-10-CM | POA: Diagnosis not present

## 2017-01-27 DIAGNOSIS — M6281 Muscle weakness (generalized): Secondary | ICD-10-CM | POA: Diagnosis not present

## 2017-01-29 DIAGNOSIS — R2681 Unsteadiness on feet: Secondary | ICD-10-CM | POA: Diagnosis not present

## 2017-01-29 DIAGNOSIS — Z9181 History of falling: Secondary | ICD-10-CM | POA: Diagnosis not present

## 2017-01-29 DIAGNOSIS — M6281 Muscle weakness (generalized): Secondary | ICD-10-CM | POA: Diagnosis not present

## 2017-01-30 ENCOUNTER — Other Ambulatory Visit (HOSPITAL_COMMUNITY): Payer: Medicare HMO

## 2017-01-30 DIAGNOSIS — Z9181 History of falling: Secondary | ICD-10-CM | POA: Diagnosis not present

## 2017-01-30 DIAGNOSIS — R2681 Unsteadiness on feet: Secondary | ICD-10-CM | POA: Diagnosis not present

## 2017-01-30 DIAGNOSIS — M6281 Muscle weakness (generalized): Secondary | ICD-10-CM | POA: Diagnosis not present

## 2017-02-03 DIAGNOSIS — Z9181 History of falling: Secondary | ICD-10-CM | POA: Diagnosis not present

## 2017-02-03 DIAGNOSIS — M6281 Muscle weakness (generalized): Secondary | ICD-10-CM | POA: Diagnosis not present

## 2017-02-03 DIAGNOSIS — R2681 Unsteadiness on feet: Secondary | ICD-10-CM | POA: Diagnosis not present

## 2017-02-05 DIAGNOSIS — Z9181 History of falling: Secondary | ICD-10-CM | POA: Diagnosis not present

## 2017-02-05 DIAGNOSIS — M6281 Muscle weakness (generalized): Secondary | ICD-10-CM | POA: Diagnosis not present

## 2017-02-05 DIAGNOSIS — R2681 Unsteadiness on feet: Secondary | ICD-10-CM | POA: Diagnosis not present

## 2017-02-06 DIAGNOSIS — M6281 Muscle weakness (generalized): Secondary | ICD-10-CM | POA: Diagnosis not present

## 2017-02-06 DIAGNOSIS — R2681 Unsteadiness on feet: Secondary | ICD-10-CM | POA: Diagnosis not present

## 2017-02-06 DIAGNOSIS — Z9181 History of falling: Secondary | ICD-10-CM | POA: Diagnosis not present

## 2017-02-10 DIAGNOSIS — Z9181 History of falling: Secondary | ICD-10-CM | POA: Diagnosis not present

## 2017-02-10 DIAGNOSIS — R2681 Unsteadiness on feet: Secondary | ICD-10-CM | POA: Diagnosis not present

## 2017-02-10 DIAGNOSIS — M6281 Muscle weakness (generalized): Secondary | ICD-10-CM | POA: Diagnosis not present

## 2017-02-12 ENCOUNTER — Ambulatory Visit (HOSPITAL_COMMUNITY): Payer: Medicare HMO | Attending: Internal Medicine

## 2017-02-12 ENCOUNTER — Other Ambulatory Visit: Payer: Self-pay

## 2017-02-12 DIAGNOSIS — I358 Other nonrheumatic aortic valve disorders: Secondary | ICD-10-CM

## 2017-02-12 DIAGNOSIS — I10 Essential (primary) hypertension: Secondary | ICD-10-CM | POA: Insufficient documentation

## 2017-02-12 DIAGNOSIS — Z87891 Personal history of nicotine dependence: Secondary | ICD-10-CM | POA: Diagnosis not present

## 2017-02-12 DIAGNOSIS — E785 Hyperlipidemia, unspecified: Secondary | ICD-10-CM | POA: Diagnosis not present

## 2017-02-12 DIAGNOSIS — R011 Cardiac murmur, unspecified: Secondary | ICD-10-CM | POA: Diagnosis not present

## 2017-02-12 DIAGNOSIS — Z853 Personal history of malignant neoplasm of breast: Secondary | ICD-10-CM | POA: Diagnosis not present

## 2017-02-12 LAB — ECHOCARDIOGRAM COMPLETE
Ao-asc: 33 cm
CHL CUP DOP CALC LVOT VTI: 43.6 cm
CHL CUP MV DEC (S): 412
CHL CUP TV REG PEAK VELOCITY: 262 cm/s
EERAT: 11.58
EWDT: 412 ms
FS: 59 % — AB (ref 28–44)
IVS/LV PW RATIO, ED: 1.05
LA ID, A-P, ES: 48 mm
LA diam end sys: 48 mm
LA vol: 59.6 mL
LADIAMINDEX: 2.39 cm/m2
LAVOLA4C: 68 mL
LAVOLIN: 29.7 mL/m2
LV E/e' medial: 11.58
LV E/e'average: 11.58
LV TDI E'LATERAL: 7.13
LV TDI E'MEDIAL: 4.05
LVELAT: 7.13 cm/s
LVOT area: 2.84 cm2
LVOT diameter: 19 mm
LVOT peak grad rest: 13 mmHg
LVOTPV: 177 cm/s
LVOTSV: 124 mL
Lateral S' vel: 10.7 cm/s
MV Peak grad: 3 mmHg
MV pk A vel: 108 m/s
MV pk E vel: 82.6 m/s
P 1/2 time: 354 ms
PW: 11 mm — AB (ref 0.6–1.1)
RV sys press: 30 mmHg
TAPSE: 19.4 mm
TR max vel: 262 cm/s

## 2017-02-13 DIAGNOSIS — Z9181 History of falling: Secondary | ICD-10-CM | POA: Diagnosis not present

## 2017-02-13 DIAGNOSIS — R2681 Unsteadiness on feet: Secondary | ICD-10-CM | POA: Diagnosis not present

## 2017-02-13 DIAGNOSIS — M6281 Muscle weakness (generalized): Secondary | ICD-10-CM | POA: Diagnosis not present

## 2017-02-17 DIAGNOSIS — M6281 Muscle weakness (generalized): Secondary | ICD-10-CM | POA: Diagnosis not present

## 2017-02-17 DIAGNOSIS — Z9181 History of falling: Secondary | ICD-10-CM | POA: Diagnosis not present

## 2017-02-17 DIAGNOSIS — R2681 Unsteadiness on feet: Secondary | ICD-10-CM | POA: Diagnosis not present

## 2017-02-19 DIAGNOSIS — M6281 Muscle weakness (generalized): Secondary | ICD-10-CM | POA: Diagnosis not present

## 2017-02-19 DIAGNOSIS — Z9181 History of falling: Secondary | ICD-10-CM | POA: Diagnosis not present

## 2017-02-19 DIAGNOSIS — R2681 Unsteadiness on feet: Secondary | ICD-10-CM | POA: Diagnosis not present

## 2017-02-20 ENCOUNTER — Telehealth: Payer: Self-pay | Admitting: Cardiovascular Disease

## 2017-02-20 NOTE — Telephone Encounter (Signed)
-----   Message from Skeet Latch, MD sent at 02/16/2017 10:16 AM EDT ----- Echo shows that her heart muscle is a little thickened.  This is likely due to her high blood pressure.  It will be important to keep this under good control.

## 2017-02-20 NOTE — Telephone Encounter (Signed)
Patient is returning a missed call from [place]. Please call, thanks.

## 2017-02-20 NOTE — Telephone Encounter (Signed)
Called- no answer, no voicemail

## 2017-02-20 NOTE — Telephone Encounter (Signed)
Advised patient of results.  

## 2017-02-24 DIAGNOSIS — R2681 Unsteadiness on feet: Secondary | ICD-10-CM | POA: Diagnosis not present

## 2017-02-24 DIAGNOSIS — Z9181 History of falling: Secondary | ICD-10-CM | POA: Diagnosis not present

## 2017-02-24 DIAGNOSIS — M6281 Muscle weakness (generalized): Secondary | ICD-10-CM | POA: Diagnosis not present

## 2017-02-26 DIAGNOSIS — R2681 Unsteadiness on feet: Secondary | ICD-10-CM | POA: Diagnosis not present

## 2017-02-26 DIAGNOSIS — M6281 Muscle weakness (generalized): Secondary | ICD-10-CM | POA: Diagnosis not present

## 2017-02-26 DIAGNOSIS — Z9181 History of falling: Secondary | ICD-10-CM | POA: Diagnosis not present

## 2017-03-03 DIAGNOSIS — M6281 Muscle weakness (generalized): Secondary | ICD-10-CM | POA: Diagnosis not present

## 2017-03-03 DIAGNOSIS — R2681 Unsteadiness on feet: Secondary | ICD-10-CM | POA: Diagnosis not present

## 2017-03-03 DIAGNOSIS — Z9181 History of falling: Secondary | ICD-10-CM | POA: Diagnosis not present

## 2017-03-05 DIAGNOSIS — M6281 Muscle weakness (generalized): Secondary | ICD-10-CM | POA: Diagnosis not present

## 2017-03-05 DIAGNOSIS — R2681 Unsteadiness on feet: Secondary | ICD-10-CM | POA: Diagnosis not present

## 2017-03-05 DIAGNOSIS — Z9181 History of falling: Secondary | ICD-10-CM | POA: Diagnosis not present

## 2017-03-10 DIAGNOSIS — M6281 Muscle weakness (generalized): Secondary | ICD-10-CM | POA: Diagnosis not present

## 2017-03-10 DIAGNOSIS — R1314 Dysphagia, pharyngoesophageal phase: Secondary | ICD-10-CM | POA: Diagnosis not present

## 2017-03-10 DIAGNOSIS — Z9181 History of falling: Secondary | ICD-10-CM | POA: Diagnosis not present

## 2017-03-10 DIAGNOSIS — R2681 Unsteadiness on feet: Secondary | ICD-10-CM | POA: Diagnosis not present

## 2017-03-12 DIAGNOSIS — R1314 Dysphagia, pharyngoesophageal phase: Secondary | ICD-10-CM | POA: Diagnosis not present

## 2017-03-12 DIAGNOSIS — R2681 Unsteadiness on feet: Secondary | ICD-10-CM | POA: Diagnosis not present

## 2017-03-12 DIAGNOSIS — M6281 Muscle weakness (generalized): Secondary | ICD-10-CM | POA: Diagnosis not present

## 2017-03-12 DIAGNOSIS — Z9181 History of falling: Secondary | ICD-10-CM | POA: Diagnosis not present

## 2017-03-17 DIAGNOSIS — Z9181 History of falling: Secondary | ICD-10-CM | POA: Diagnosis not present

## 2017-03-17 DIAGNOSIS — M6281 Muscle weakness (generalized): Secondary | ICD-10-CM | POA: Diagnosis not present

## 2017-03-17 DIAGNOSIS — R2681 Unsteadiness on feet: Secondary | ICD-10-CM | POA: Diagnosis not present

## 2017-03-17 DIAGNOSIS — R1314 Dysphagia, pharyngoesophageal phase: Secondary | ICD-10-CM | POA: Diagnosis not present

## 2017-03-19 DIAGNOSIS — R2681 Unsteadiness on feet: Secondary | ICD-10-CM | POA: Diagnosis not present

## 2017-03-19 DIAGNOSIS — Z9181 History of falling: Secondary | ICD-10-CM | POA: Diagnosis not present

## 2017-03-19 DIAGNOSIS — M6281 Muscle weakness (generalized): Secondary | ICD-10-CM | POA: Diagnosis not present

## 2017-03-19 DIAGNOSIS — R1314 Dysphagia, pharyngoesophageal phase: Secondary | ICD-10-CM | POA: Diagnosis not present

## 2017-03-20 DIAGNOSIS — H1851 Endothelial corneal dystrophy: Secondary | ICD-10-CM | POA: Diagnosis not present

## 2017-03-20 DIAGNOSIS — H353112 Nonexudative age-related macular degeneration, right eye, intermediate dry stage: Secondary | ICD-10-CM | POA: Diagnosis not present

## 2017-03-20 DIAGNOSIS — H40013 Open angle with borderline findings, low risk, bilateral: Secondary | ICD-10-CM | POA: Diagnosis not present

## 2017-03-20 DIAGNOSIS — H524 Presbyopia: Secondary | ICD-10-CM | POA: Diagnosis not present

## 2017-03-20 DIAGNOSIS — H35373 Puckering of macula, bilateral: Secondary | ICD-10-CM | POA: Diagnosis not present

## 2017-03-26 DIAGNOSIS — R2681 Unsteadiness on feet: Secondary | ICD-10-CM | POA: Diagnosis not present

## 2017-03-26 DIAGNOSIS — Z9181 History of falling: Secondary | ICD-10-CM | POA: Diagnosis not present

## 2017-03-26 DIAGNOSIS — R1314 Dysphagia, pharyngoesophageal phase: Secondary | ICD-10-CM | POA: Diagnosis not present

## 2017-03-26 DIAGNOSIS — M6281 Muscle weakness (generalized): Secondary | ICD-10-CM | POA: Diagnosis not present

## 2017-03-29 ENCOUNTER — Other Ambulatory Visit: Payer: Self-pay | Admitting: Nurse Practitioner

## 2017-04-02 DIAGNOSIS — Z9181 History of falling: Secondary | ICD-10-CM | POA: Diagnosis not present

## 2017-04-02 DIAGNOSIS — M6281 Muscle weakness (generalized): Secondary | ICD-10-CM | POA: Diagnosis not present

## 2017-04-02 DIAGNOSIS — R1314 Dysphagia, pharyngoesophageal phase: Secondary | ICD-10-CM | POA: Diagnosis not present

## 2017-04-02 DIAGNOSIS — R2681 Unsteadiness on feet: Secondary | ICD-10-CM | POA: Diagnosis not present

## 2017-04-03 DIAGNOSIS — F319 Bipolar disorder, unspecified: Secondary | ICD-10-CM | POA: Diagnosis not present

## 2017-04-03 DIAGNOSIS — E669 Obesity, unspecified: Secondary | ICD-10-CM | POA: Diagnosis not present

## 2017-04-03 DIAGNOSIS — Z0000 Encounter for general adult medical examination without abnormal findings: Secondary | ICD-10-CM | POA: Diagnosis not present

## 2017-04-03 DIAGNOSIS — F259 Schizoaffective disorder, unspecified: Secondary | ICD-10-CM | POA: Diagnosis not present

## 2017-04-03 DIAGNOSIS — G301 Alzheimer's disease with late onset: Secondary | ICD-10-CM | POA: Diagnosis not present

## 2017-04-03 DIAGNOSIS — I10 Essential (primary) hypertension: Secondary | ICD-10-CM | POA: Diagnosis not present

## 2017-04-03 DIAGNOSIS — R1310 Dysphagia, unspecified: Secondary | ICD-10-CM | POA: Diagnosis not present

## 2017-04-03 DIAGNOSIS — Z79899 Other long term (current) drug therapy: Secondary | ICD-10-CM | POA: Diagnosis not present

## 2017-04-03 DIAGNOSIS — H00012 Hordeolum externum right lower eyelid: Secondary | ICD-10-CM | POA: Diagnosis not present

## 2017-04-13 ENCOUNTER — Other Ambulatory Visit: Payer: Self-pay | Admitting: Nurse Practitioner

## 2017-04-14 DIAGNOSIS — R1314 Dysphagia, pharyngoesophageal phase: Secondary | ICD-10-CM | POA: Diagnosis not present

## 2017-04-28 DIAGNOSIS — R1314 Dysphagia, pharyngoesophageal phase: Secondary | ICD-10-CM | POA: Diagnosis not present

## 2017-05-11 ENCOUNTER — Other Ambulatory Visit: Payer: Self-pay | Admitting: Nurse Practitioner

## 2017-05-28 DIAGNOSIS — Z853 Personal history of malignant neoplasm of breast: Secondary | ICD-10-CM | POA: Diagnosis not present

## 2017-05-28 DIAGNOSIS — N6310 Unspecified lump in the right breast, unspecified quadrant: Secondary | ICD-10-CM | POA: Diagnosis not present

## 2017-05-28 DIAGNOSIS — N6311 Unspecified lump in the right breast, upper outer quadrant: Secondary | ICD-10-CM | POA: Diagnosis not present

## 2017-06-01 ENCOUNTER — Other Ambulatory Visit: Payer: Self-pay | Admitting: Radiology

## 2017-06-01 DIAGNOSIS — N6031 Fibrosclerosis of right breast: Secondary | ICD-10-CM | POA: Diagnosis not present

## 2017-06-01 DIAGNOSIS — N641 Fat necrosis of breast: Secondary | ICD-10-CM | POA: Diagnosis not present

## 2017-06-04 DIAGNOSIS — C50911 Malignant neoplasm of unspecified site of right female breast: Secondary | ICD-10-CM | POA: Diagnosis not present

## 2017-06-04 DIAGNOSIS — M8580 Other specified disorders of bone density and structure, unspecified site: Secondary | ICD-10-CM | POA: Diagnosis not present

## 2017-06-04 DIAGNOSIS — Z6835 Body mass index (BMI) 35.0-35.9, adult: Secondary | ICD-10-CM | POA: Diagnosis not present

## 2017-06-04 DIAGNOSIS — Z1211 Encounter for screening for malignant neoplasm of colon: Secondary | ICD-10-CM | POA: Diagnosis not present

## 2017-06-04 DIAGNOSIS — Z01419 Encounter for gynecological examination (general) (routine) without abnormal findings: Secondary | ICD-10-CM | POA: Diagnosis not present

## 2017-07-17 DIAGNOSIS — R011 Cardiac murmur, unspecified: Secondary | ICD-10-CM | POA: Diagnosis not present

## 2017-07-17 DIAGNOSIS — R350 Frequency of micturition: Secondary | ICD-10-CM | POA: Diagnosis not present

## 2017-07-17 DIAGNOSIS — I10 Essential (primary) hypertension: Secondary | ICD-10-CM | POA: Diagnosis not present

## 2017-08-04 DIAGNOSIS — M1712 Unilateral primary osteoarthritis, left knee: Secondary | ICD-10-CM | POA: Diagnosis not present

## 2017-08-04 DIAGNOSIS — M25562 Pain in left knee: Secondary | ICD-10-CM | POA: Diagnosis not present

## 2017-08-04 DIAGNOSIS — G8929 Other chronic pain: Secondary | ICD-10-CM | POA: Diagnosis not present

## 2017-08-11 DIAGNOSIS — M6281 Muscle weakness (generalized): Secondary | ICD-10-CM | POA: Diagnosis not present

## 2017-08-11 DIAGNOSIS — R2681 Unsteadiness on feet: Secondary | ICD-10-CM | POA: Diagnosis not present

## 2017-08-11 DIAGNOSIS — Z9181 History of falling: Secondary | ICD-10-CM | POA: Diagnosis not present

## 2017-08-13 DIAGNOSIS — M6281 Muscle weakness (generalized): Secondary | ICD-10-CM | POA: Diagnosis not present

## 2017-08-13 DIAGNOSIS — R2681 Unsteadiness on feet: Secondary | ICD-10-CM | POA: Diagnosis not present

## 2017-08-13 DIAGNOSIS — Z9181 History of falling: Secondary | ICD-10-CM | POA: Diagnosis not present

## 2017-08-18 DIAGNOSIS — Z9181 History of falling: Secondary | ICD-10-CM | POA: Diagnosis not present

## 2017-08-18 DIAGNOSIS — R2681 Unsteadiness on feet: Secondary | ICD-10-CM | POA: Diagnosis not present

## 2017-08-18 DIAGNOSIS — M6281 Muscle weakness (generalized): Secondary | ICD-10-CM | POA: Diagnosis not present

## 2017-08-20 ENCOUNTER — Ambulatory Visit (INDEPENDENT_AMBULATORY_CARE_PROVIDER_SITE_OTHER): Payer: Medicare HMO | Admitting: Cardiovascular Disease

## 2017-08-20 ENCOUNTER — Encounter: Payer: Self-pay | Admitting: Cardiovascular Disease

## 2017-08-20 VITALS — BP 127/65 | HR 65 | Ht 62.0 in | Wt 196.4 lb

## 2017-08-20 DIAGNOSIS — E7800 Pure hypercholesterolemia, unspecified: Secondary | ICD-10-CM

## 2017-08-20 DIAGNOSIS — E781 Pure hyperglyceridemia: Secondary | ICD-10-CM | POA: Diagnosis not present

## 2017-08-20 DIAGNOSIS — I10 Essential (primary) hypertension: Secondary | ICD-10-CM

## 2017-08-20 DIAGNOSIS — I358 Other nonrheumatic aortic valve disorders: Secondary | ICD-10-CM | POA: Diagnosis not present

## 2017-08-20 NOTE — Progress Notes (Signed)
Cardiology Office Note   Date:  08/22/2017   ID:  [name], DOB 1941-03-24, MRN [id number]  PCP:  [name], MD  Cardiologist:   Skeet Latch, MD   Chief Complaint  Patient presents with  . Follow-up    6 months;   . Shortness of Breath    due to exertion  . Edema    in ankles.     History of Present Illness: [name] is a 76 y.o. female with hypertension, hyperlipidemia, chronic shortness of breath, prior breast cancer, Alzheimer's disease, schizo-affective disorder and bipolar disorder who presents for follow up.  She was previously a patient of Dr. Mare Ferrari.  She previously had a nuclear stress 05/2014 that was negative for ischemia.  She had an echo 04/2008 that revealed normal systolic function, mild aortic sclerosis, trace mitral regurgitation and trace tricuspid regurgitation.  [name] lives at [facility]. She has several friends there and is very happy.  At her last appointment she was noted to have a murmur and was referred for an echo that revealed LVEF 65-70% with grade 1 diastolic dysfunction.  She was noted to have a narrow LVOT.   Three weeks ago [name] had a bad fall. She got up to use the bathroom in the middle of the night.  She didn't feel dizzy and didn't trip but she fell forward into her bathroom shower. Since then she has been struggling with pain all over her body.  She has been doing physical therapy twice weekly.  Since then she hasn't been able to fully resume her exercise routine.  She hasn't noted any increased LE edema, nor orthopnea or PND.  She denies chest pain or shortness of breath.  She sometimes worries about memory loss but tries not to let it bother her.   She is friends with [name]    Past Medical History:  Diagnosis Date  . Anxiety   . Arrhythmia    right bundle branch block  . Bipolar 1 disorder ([place])   . Cancer ([place])   . Hyperlipidemia   . Hypertension   . Morbid obesity ([place])   .  Osteoarthritis   . Schizo-affective schizophrenia ([place])   . Thyroid disease    hypothyroidism    Past Surgical History:  Procedure Laterality Date  . BREAST SURGERY    . CHOLECYSTECTOMY    . DILATION AND CURETTAGE OF UTERUS    . HAND SURGERY     Right-trigger finger  . KNEE SURGERY     Left  . TONSILLECTOMY       Current Outpatient Prescriptions  Medication Sig Dispense Refill  . ALPRAZolam (XANAX) 0.5 MG tablet Take 0.5-2 mg by mouth 4 (four) times daily as needed for anxiety. Patient may take 3-4 tablets by mouth as needed at bedtime for sleep and 1-3 tablets by mouth three times daily as needed for anxiety    . aspirin 81 MG tablet Take 81 mg by mouth daily.    . Calcium Carbonate (CALCIUM 600 PO) Take 1 tablet by mouth daily.    Marland Kitchen co-enzyme Q-10 30 MG capsule Take 30 mg by mouth daily.    Marland Kitchen donepezil (ARICEPT) 10 MG tablet Take 10 mg by mouth every morning.     . fenofibrate (TRICOR) 145 MG tablet TAKE 145 MG BY MOUTH EVERY TUESDAY, THURSDAY, AND SATURDAY AT 6 PM. 15 tablet 3  . lamoTRIgine (LAMICTAL) 150 MG tablet Take 150 mg by mouth 2 (  two) times daily.  12  . memantine (NAMENDA) 10 MG tablet Take 10 mg by mouth 2 (two) times daily.      . metoprolol tartrate (LOPRESSOR) 25 MG tablet TAKE 1 TABLET EVERY DAY 30 tablet 7  . Multiple Vitamins-Minerals (MULTIVITAMIN ADULT PO) Take 1 tablet by mouth daily.    . naproxen sodium (ANAPROX) 220 MG tablet Take 220 mg by mouth 2 (two) times daily with a meal.    . OLANZapine (ZYPREXA) 15 MG tablet Take 30 mg by mouth at bedtime.      Marland Kitchen omeprazole (PRILOSEC) 40 MG capsule 1 CAPSULE ONCE A DAY ORALLY 30 DAYS  5  . QUEtiapine (SEROQUEL) 25 MG tablet Take 50 mg by mouth at bedtime.     . ramelteon (ROZEREM) 8 MG tablet Take 8 mg by mouth at bedtime.      Marland Kitchen spironolactone (ALDACTONE) 25 MG tablet TAKE 1 TABLET BY MOUTH DAILY 30 tablet 11  . SYNTHROID 150 MCG tablet TAKE 1 TABLET (150 MCG TOTAL) BY MOUTH DAILY. 30 tablet 6  . tolterodine  (DETROL) 2 MG tablet Take 2 mg by mouth daily.     No current facility-administered medications for this visit.     Allergies:   Macrodantin; Paxil [paroxetine]; Prozac [fluoxetine hcl]; Sertraline hcl; Sulfa antibiotics; and Wellbutrin [bupropion hcl]    Social History:  The patient  reports that she quit smoking about 38 years ago. She has never used smokeless tobacco. She reports that she drinks alcohol. She reports that she does not use drugs.   Family History:  The patient's family history includes Hypertension in her mother.    ROS:  Please see the history of present illness.   Otherwise, review of systems are positive for none.   All other systems are reviewed and negative.    PHYSICAL EXAM: VS:  BP 127/65   Pulse 65   Ht 5\' 2"  (1.575 m)   Wt 89.1 kg (196 lb 6.4 oz)   BMI 35.92 kg/m  , BMI Body mass index is 35.92 kg/m. GENERAL:  Well appearing elderly woman in no acute distress HEENT: Pupils equal round and reactive, fundi not visualized, oral mucosa unremarkable NECK:  No jugular venous distention, waveform within normal limits, carotid upstroke brisk and symmetric, no bruits, no thyromegaly LUNGS:  Clear to auscultation bilaterally HEART:  RRR.  PMI not displaced or sustained,S1 and S2 within normal limits, no S3, no S4, no clicks, no rubs, II/VI systolic murmur at the LUSB ABD:  Flat, positive bowel sounds normal in frequency in pitch, no bruits, no rebound, no guarding, no midline pulsatile mass, no hepatomegaly, no splenomegaly EXT:  2 plus pulses throughout, no edema, no cyanosis no clubbing SKIN:  No rashes no nodules.  Multiple ecchymoses. NEURO:  Cranial nerves II through XII grossly intact, motor grossly intact throughout PSYCH:  Cognitively intact, oriented to person place and time    EKG:  EKG is ordered today. The ekg ordered today demonstrates sinus rhythm.  Rate 71 bpm.  RBBB.  Lateral TWI 08/20/17: Sinus rhythm.  Rate 65 bpm.  RBBB.    Lexiscan Myoview  05/26/14: LVEF 66%. No ischemia.  Echo 02/12/17: Study Conclusions  - Left ventricle: Narrow LVOT No obstruction at rest. The cavity   size was normal. Wall thickness was increased in a pattern of   mild LVH. Systolic function was vigorous. The estimated ejection   fraction was in the range of 65% to 70%. Doppler parameters are  consistent with abnormal left ventricular relaxation (grade 1   diastolic dysfunction). - Aortic valve: There was trivial regurgitation. - Mitral valve: Calcified annulus. Mildly thickened leaflets . - Left atrium: The atrium was mildly dilated.  Recent Labs: No results found for requested labs within last 8760 hours.   08/07/16: Sodium 141, potassium 4.0, BUN 22, creatinine 0.92  Lipid Panel    Component Value Date/Time   CHOL 187 10/10/2015 0933   TRIG 315 (H) 10/10/2015 0933   HDL 36 (L) 10/10/2015 0933   CHOLHDL 5.2 (H) 10/10/2015 0933   VLDL 63 (H) 10/10/2015 0933   LDLCALC 88 10/10/2015 0933   LDLDIRECT 118.0 11/09/2014 0853     04/03/17: Sodium 142, potassium 4.7, BUN 21, creatinine 1.01 AST 28, ALT 35 Total cholesterol 177, triglycerides 164, HDL 41, LDL 103  Wt Readings from Last 3 Encounters:  08/20/17 89.1 kg (196 lb 6.4 oz)  01/09/17 88.5 kg (195 lb)  04/30/16 85.6 kg (188 lb 12.8 oz)      ASSESSMENT AND PLAN:  # Aortic sclerosis: Ms. Steinhart  was noted to have a systolic murmur on exam And was referred for an echo. No aortic stenosis was seen but she does have a narrow LVOT. This is likely the source of her murmur.  # Hypertension: Blood pressure well-controlled. Continue metoprolol.  # Hyperlipidemia: LDL 108 and triglycerides were 164 on 02/2017. Continue fenofibrate.   Current medicines are reviewed at length with the patient today.  The patient does not have concerns regarding medicines.  The following changes have been made:  no change  Labs/ tests ordered today include:   No orders of the defined types were placed in  this encounter.    Disposition:   FU with Corday Wyka C. Oval Linsey, MD, [facility] in 1 year   This note was written with the assistance of speech recognition software.  Please excuse any transcriptional errors.  Signed, Peyton Spengler C. Oval Linsey, MD, [facility]  08/22/2017 4:49 PM    [facility]

## 2017-08-20 NOTE — Patient Instructions (Signed)

## 2017-08-21 DIAGNOSIS — Z9181 History of falling: Secondary | ICD-10-CM | POA: Diagnosis not present

## 2017-08-21 DIAGNOSIS — R2681 Unsteadiness on feet: Secondary | ICD-10-CM | POA: Diagnosis not present

## 2017-08-21 DIAGNOSIS — M6281 Muscle weakness (generalized): Secondary | ICD-10-CM | POA: Diagnosis not present

## 2017-08-25 DIAGNOSIS — Z9181 History of falling: Secondary | ICD-10-CM | POA: Diagnosis not present

## 2017-08-25 DIAGNOSIS — M6281 Muscle weakness (generalized): Secondary | ICD-10-CM | POA: Diagnosis not present

## 2017-08-25 DIAGNOSIS — R2681 Unsteadiness on feet: Secondary | ICD-10-CM | POA: Diagnosis not present

## 2017-08-27 DIAGNOSIS — M6281 Muscle weakness (generalized): Secondary | ICD-10-CM | POA: Diagnosis not present

## 2017-08-27 DIAGNOSIS — R2681 Unsteadiness on feet: Secondary | ICD-10-CM | POA: Diagnosis not present

## 2017-08-27 DIAGNOSIS — Z9181 History of falling: Secondary | ICD-10-CM | POA: Diagnosis not present

## 2017-09-03 DIAGNOSIS — Z9181 History of falling: Secondary | ICD-10-CM | POA: Diagnosis not present

## 2017-09-03 DIAGNOSIS — M6281 Muscle weakness (generalized): Secondary | ICD-10-CM | POA: Diagnosis not present

## 2017-09-03 DIAGNOSIS — R2681 Unsteadiness on feet: Secondary | ICD-10-CM | POA: Diagnosis not present

## 2017-09-04 DIAGNOSIS — Z9181 History of falling: Secondary | ICD-10-CM | POA: Diagnosis not present

## 2017-09-04 DIAGNOSIS — M6281 Muscle weakness (generalized): Secondary | ICD-10-CM | POA: Diagnosis not present

## 2017-09-04 DIAGNOSIS — R2681 Unsteadiness on feet: Secondary | ICD-10-CM | POA: Diagnosis not present

## 2017-09-08 DIAGNOSIS — Z9181 History of falling: Secondary | ICD-10-CM | POA: Diagnosis not present

## 2017-09-08 DIAGNOSIS — R2681 Unsteadiness on feet: Secondary | ICD-10-CM | POA: Diagnosis not present

## 2017-09-08 DIAGNOSIS — M6281 Muscle weakness (generalized): Secondary | ICD-10-CM | POA: Diagnosis not present

## 2017-09-11 DIAGNOSIS — R2681 Unsteadiness on feet: Secondary | ICD-10-CM | POA: Diagnosis not present

## 2017-09-11 DIAGNOSIS — Z9181 History of falling: Secondary | ICD-10-CM | POA: Diagnosis not present

## 2017-09-11 DIAGNOSIS — M6281 Muscle weakness (generalized): Secondary | ICD-10-CM | POA: Diagnosis not present

## 2017-09-15 DIAGNOSIS — Z9181 History of falling: Secondary | ICD-10-CM | POA: Diagnosis not present

## 2017-09-15 DIAGNOSIS — M6281 Muscle weakness (generalized): Secondary | ICD-10-CM | POA: Diagnosis not present

## 2017-09-15 DIAGNOSIS — R2681 Unsteadiness on feet: Secondary | ICD-10-CM | POA: Diagnosis not present

## 2017-09-17 DIAGNOSIS — Z9181 History of falling: Secondary | ICD-10-CM | POA: Diagnosis not present

## 2017-09-17 DIAGNOSIS — M6281 Muscle weakness (generalized): Secondary | ICD-10-CM | POA: Diagnosis not present

## 2017-09-17 DIAGNOSIS — R2681 Unsteadiness on feet: Secondary | ICD-10-CM | POA: Diagnosis not present

## 2017-09-22 DIAGNOSIS — R2681 Unsteadiness on feet: Secondary | ICD-10-CM | POA: Diagnosis not present

## 2017-09-22 DIAGNOSIS — M6281 Muscle weakness (generalized): Secondary | ICD-10-CM | POA: Diagnosis not present

## 2017-09-22 DIAGNOSIS — Z9181 History of falling: Secondary | ICD-10-CM | POA: Diagnosis not present

## 2017-09-24 DIAGNOSIS — Z9181 History of falling: Secondary | ICD-10-CM | POA: Diagnosis not present

## 2017-09-24 DIAGNOSIS — M6281 Muscle weakness (generalized): Secondary | ICD-10-CM | POA: Diagnosis not present

## 2017-09-24 DIAGNOSIS — R2681 Unsteadiness on feet: Secondary | ICD-10-CM | POA: Diagnosis not present

## 2017-09-29 DIAGNOSIS — R2681 Unsteadiness on feet: Secondary | ICD-10-CM | POA: Diagnosis not present

## 2017-09-29 DIAGNOSIS — M6281 Muscle weakness (generalized): Secondary | ICD-10-CM | POA: Diagnosis not present

## 2017-09-29 DIAGNOSIS — Z9181 History of falling: Secondary | ICD-10-CM | POA: Diagnosis not present

## 2017-10-01 DIAGNOSIS — Z23 Encounter for immunization: Secondary | ICD-10-CM | POA: Diagnosis not present

## 2017-10-01 DIAGNOSIS — I10 Essential (primary) hypertension: Secondary | ICD-10-CM | POA: Diagnosis not present

## 2017-10-04 ENCOUNTER — Other Ambulatory Visit: Payer: Self-pay | Admitting: Cardiovascular Disease

## 2017-10-05 NOTE — Telephone Encounter (Signed)
Please review for refill, Thanks !  

## 2017-10-06 DIAGNOSIS — R2681 Unsteadiness on feet: Secondary | ICD-10-CM | POA: Diagnosis not present

## 2017-10-06 DIAGNOSIS — Z9181 History of falling: Secondary | ICD-10-CM | POA: Diagnosis not present

## 2017-10-06 DIAGNOSIS — M6281 Muscle weakness (generalized): Secondary | ICD-10-CM | POA: Diagnosis not present

## 2017-10-08 DIAGNOSIS — Z9181 History of falling: Secondary | ICD-10-CM | POA: Diagnosis not present

## 2017-10-08 DIAGNOSIS — M6281 Muscle weakness (generalized): Secondary | ICD-10-CM | POA: Diagnosis not present

## 2017-10-08 DIAGNOSIS — R2681 Unsteadiness on feet: Secondary | ICD-10-CM | POA: Diagnosis not present

## 2017-10-16 DIAGNOSIS — M545 Low back pain: Secondary | ICD-10-CM | POA: Diagnosis not present

## 2017-10-16 DIAGNOSIS — M1712 Unilateral primary osteoarthritis, left knee: Secondary | ICD-10-CM | POA: Diagnosis not present

## 2017-10-20 DIAGNOSIS — Z9181 History of falling: Secondary | ICD-10-CM | POA: Diagnosis not present

## 2017-10-20 DIAGNOSIS — R2681 Unsteadiness on feet: Secondary | ICD-10-CM | POA: Diagnosis not present

## 2017-10-20 DIAGNOSIS — M6281 Muscle weakness (generalized): Secondary | ICD-10-CM | POA: Diagnosis not present

## 2017-10-23 DIAGNOSIS — C44722 Squamous cell carcinoma of skin of right lower limb, including hip: Secondary | ICD-10-CM | POA: Diagnosis not present

## 2017-10-23 DIAGNOSIS — D2271 Melanocytic nevi of right lower limb, including hip: Secondary | ICD-10-CM | POA: Diagnosis not present

## 2017-10-23 DIAGNOSIS — C44729 Squamous cell carcinoma of skin of left lower limb, including hip: Secondary | ICD-10-CM | POA: Diagnosis not present

## 2017-10-23 DIAGNOSIS — D485 Neoplasm of uncertain behavior of skin: Secondary | ICD-10-CM | POA: Diagnosis not present

## 2017-10-23 DIAGNOSIS — L821 Other seborrheic keratosis: Secondary | ICD-10-CM | POA: Diagnosis not present

## 2017-11-05 DIAGNOSIS — H1851 Endothelial corneal dystrophy: Secondary | ICD-10-CM | POA: Diagnosis not present

## 2017-11-05 DIAGNOSIS — H01003 Unspecified blepharitis right eye, unspecified eyelid: Secondary | ICD-10-CM | POA: Diagnosis not present

## 2017-11-05 DIAGNOSIS — H40013 Open angle with borderline findings, low risk, bilateral: Secondary | ICD-10-CM | POA: Diagnosis not present

## 2017-11-05 DIAGNOSIS — H04123 Dry eye syndrome of bilateral lacrimal glands: Secondary | ICD-10-CM | POA: Diagnosis not present

## 2017-11-19 DIAGNOSIS — R1312 Dysphagia, oropharyngeal phase: Secondary | ICD-10-CM | POA: Diagnosis not present

## 2017-11-19 DIAGNOSIS — Z1211 Encounter for screening for malignant neoplasm of colon: Secondary | ICD-10-CM | POA: Diagnosis not present

## 2017-11-24 DIAGNOSIS — M545 Low back pain: Secondary | ICD-10-CM | POA: Diagnosis not present

## 2017-11-26 ENCOUNTER — Other Ambulatory Visit (HOSPITAL_COMMUNITY): Payer: Self-pay | Admitting: Gastroenterology

## 2017-11-26 DIAGNOSIS — M545 Low back pain: Secondary | ICD-10-CM | POA: Diagnosis not present

## 2017-11-26 DIAGNOSIS — R1310 Dysphagia, unspecified: Secondary | ICD-10-CM

## 2017-12-10 ENCOUNTER — Ambulatory Visit (HOSPITAL_COMMUNITY)
Admission: RE | Admit: 2017-12-10 | Discharge: 2017-12-10 | Disposition: A | Discharge status: Home / Self Care | Payer: Medicare HMO | Source: Ambulatory Visit | Attending: Gastroenterology | Admitting: Gastroenterology

## 2017-12-10 DIAGNOSIS — R05 Cough: Secondary | ICD-10-CM | POA: Diagnosis not present

## 2017-12-10 DIAGNOSIS — R1310 Dysphagia, unspecified: Secondary | ICD-10-CM | POA: Insufficient documentation

## 2017-12-17 DIAGNOSIS — Z853 Personal history of malignant neoplasm of breast: Secondary | ICD-10-CM | POA: Diagnosis not present

## 2017-12-17 DIAGNOSIS — R928 Other abnormal and inconclusive findings on diagnostic imaging of breast: Secondary | ICD-10-CM | POA: Diagnosis not present

## 2017-12-22 ENCOUNTER — Other Ambulatory Visit: Payer: Self-pay | Admitting: Nurse Practitioner

## 2018-01-07 DIAGNOSIS — K2930 Chronic superficial gastritis without bleeding: Secondary | ICD-10-CM | POA: Diagnosis not present

## 2018-01-07 DIAGNOSIS — K219 Gastro-esophageal reflux disease without esophagitis: Secondary | ICD-10-CM | POA: Diagnosis not present

## 2018-01-07 DIAGNOSIS — R1310 Dysphagia, unspecified: Secondary | ICD-10-CM | POA: Diagnosis not present

## 2018-01-12 ENCOUNTER — Other Ambulatory Visit: Payer: Self-pay | Admitting: Gastroenterology

## 2018-01-13 DIAGNOSIS — K2930 Chronic superficial gastritis without bleeding: Secondary | ICD-10-CM | POA: Diagnosis not present

## 2018-01-13 DIAGNOSIS — K219 Gastro-esophageal reflux disease without esophagitis: Secondary | ICD-10-CM | POA: Diagnosis not present

## 2018-01-29 ENCOUNTER — Encounter (HOSPITAL_COMMUNITY): Payer: Self-pay | Admitting: Gastroenterology

## 2018-01-29 ENCOUNTER — Ambulatory Visit (HOSPITAL_COMMUNITY)
Admission: RE | Admit: 2018-01-29 | Discharge: 2018-01-29 | Disposition: A | Discharge status: Home / Self Care | Payer: Medicare HMO | Source: Ambulatory Visit | Attending: Gastroenterology | Admitting: Gastroenterology

## 2018-01-29 ENCOUNTER — Encounter (HOSPITAL_COMMUNITY)
Admission: RE | Disposition: A | Discharge status: Home / Self Care | Payer: Self-pay | Source: Ambulatory Visit | Attending: Gastroenterology

## 2018-01-29 DIAGNOSIS — K220 Achalasia of cardia: Secondary | ICD-10-CM | POA: Insufficient documentation

## 2018-01-29 DIAGNOSIS — R1310 Dysphagia, unspecified: Secondary | ICD-10-CM | POA: Diagnosis present

## 2018-01-29 HISTORY — PX: ESOPHAGEAL MANOMETRY: SHX5429

## 2018-01-29 SURGERY — MANOMETRY, ESOPHAGUS

## 2018-01-29 MED ORDER — LIDOCAINE VISCOUS 2 % MT SOLN
OROMUCOSAL | Status: AC
  Filled 2018-01-29: qty 15

## 2018-01-29 SURGICAL SUPPLY — 2 items
FACESHIELD LNG OPTICON STERILE (SAFETY) IMPLANT
GLOVE BIO SURGEON STRL SZ8 (GLOVE) ×6 IMPLANT

## 2018-02-02 DIAGNOSIS — M1712 Unilateral primary osteoarthritis, left knee: Secondary | ICD-10-CM | POA: Diagnosis not present

## 2018-02-02 DIAGNOSIS — R1310 Dysphagia, unspecified: Secondary | ICD-10-CM | POA: Diagnosis not present

## 2018-02-02 DIAGNOSIS — M25562 Pain in left knee: Secondary | ICD-10-CM | POA: Diagnosis not present

## 2018-02-02 DIAGNOSIS — M545 Low back pain: Secondary | ICD-10-CM | POA: Diagnosis not present

## 2018-02-02 DIAGNOSIS — K220 Achalasia of cardia: Secondary | ICD-10-CM | POA: Diagnosis not present

## 2018-02-25 DIAGNOSIS — L821 Other seborrheic keratosis: Secondary | ICD-10-CM | POA: Diagnosis not present

## 2018-02-25 DIAGNOSIS — Z85828 Personal history of other malignant neoplasm of skin: Secondary | ICD-10-CM | POA: Diagnosis not present

## 2018-03-01 DIAGNOSIS — K220 Achalasia of cardia: Secondary | ICD-10-CM | POA: Diagnosis not present

## 2018-03-02 ENCOUNTER — Telehealth: Payer: Self-pay | Admitting: Cardiovascular Disease

## 2018-03-02 NOTE — Telephone Encounter (Signed)
Left message to call back  

## 2018-03-02 NOTE — Telephone Encounter (Signed)
Pt called and stated she needs and Echo done on same day as her appt 4/18 the Order will be fax coming from [place] [facility] Dorena Dew.phone [phone]  [phone] [facility].    [place]    Pt stated she needs this done before her procedure on 5/1

## 2018-03-02 NOTE — Telephone Encounter (Signed)
Spoke with patient and she has surgery scheduled in May and was told she needed updated Echo. Patient denies any change in her breathing or chest pains. Last Echo March 2018.

## 2018-03-08 NOTE — Telephone Encounter (Signed)
Pt aware will forward to Dr Oval Linsey to ask if pt needs an echo prior to surgery ./cy

## 2018-03-08 NOTE — Telephone Encounter (Signed)
Spoke with Dr Laroy Apple office and patient just needs surgical clearance. Per patient she has no new symptoms. Discussed with Dr Oval Linsey and no need to have repeat Echo just keep appointment 03/18/18. Left detailed message on home number, ok per patient.

## 2018-03-08 NOTE — Telephone Encounter (Signed)
New message  Pt verbalized that she is returning call for RN  About last echo and if she needs another or not   If pt dont answer please leave vm if she needs to keep or cancel appt on 03/11/2018

## 2018-03-08 NOTE — Telephone Encounter (Signed)
New Message:    Pt is calling to discuss Echo(pt not sure is she should have Echo done) and states is okay to leave msg.

## 2018-03-08 NOTE — Telephone Encounter (Signed)
Follow up  Pt states she does not need the echo and wants to know if its ok to cancel

## 2018-03-11 ENCOUNTER — Ambulatory Visit (HOSPITAL_COMMUNITY): Payer: Medicare HMO

## 2018-03-18 ENCOUNTER — Ambulatory Visit (INDEPENDENT_AMBULATORY_CARE_PROVIDER_SITE_OTHER): Payer: Medicare HMO | Admitting: Physician Assistant

## 2018-03-18 ENCOUNTER — Encounter: Payer: Self-pay | Admitting: Physician Assistant

## 2018-03-18 VITALS — BP 150/80 | HR 92 | Ht 62.0 in

## 2018-03-18 DIAGNOSIS — G309 Alzheimer's disease, unspecified: Secondary | ICD-10-CM | POA: Diagnosis not present

## 2018-03-18 DIAGNOSIS — F0280 Dementia in other diseases classified elsewhere without behavioral disturbance: Secondary | ICD-10-CM

## 2018-03-18 DIAGNOSIS — K220 Achalasia of cardia: Secondary | ICD-10-CM | POA: Diagnosis not present

## 2018-03-18 DIAGNOSIS — Z01810 Encounter for preprocedural cardiovascular examination: Secondary | ICD-10-CM | POA: Diagnosis not present

## 2018-03-18 DIAGNOSIS — I119 Hypertensive heart disease without heart failure: Secondary | ICD-10-CM | POA: Diagnosis not present

## 2018-03-18 DIAGNOSIS — R0600 Dyspnea, unspecified: Secondary | ICD-10-CM

## 2018-03-18 DIAGNOSIS — Q248 Other specified congenital malformations of heart: Secondary | ICD-10-CM

## 2018-03-18 DIAGNOSIS — E785 Hyperlipidemia, unspecified: Secondary | ICD-10-CM | POA: Diagnosis not present

## 2018-03-18 MED ORDER — METOPROLOL TARTRATE 25 MG PO TABS: 25.0000 mg | ORAL_TABLET | Freq: Two times a day (BID) | ORAL | 3 refills | Status: DC

## 2018-03-18 NOTE — Progress Notes (Signed)
Cardiology Office Note    Date:  03/19/2018   ID:  [name], DOB [birth year], MRN [id number]  PCP:  [name], MD  Cardiologist:  Dr. Oval Linsey   Chief Complaint  Patient presents with  . Pre-op Exam    seen for Dr. Oval Linsey, requested by Dr. Margart Sickles of [facility] prior to esophagomyomectomy w fundoplasty    History of Present Illness:  [name] is a 77 y.o. female with PMH of hypertension, hyperlipidemia, chronic dyspnea, prior breast cancer, Alzheimer's disease, schizoaffective disorder, and bipolar disorder.  She was a previous patient of Dr. Mare Ferrari before his retirement.  He had a Myoview in June 2015 that was negative for ischemia.  Echocardiogram in May 2009 showed normal LV function, mild aortic sclerosis, trace mitral regurgitation and trace tricuspid regurgitation.  She lives in [place] [facility].  Last echocardiogram obtained on 02/12/2017 showed EF 65-70%, narrow LVOT with no obstruction at rest, mild LVH, grade 1 DD, trivial aortic regurgitation, calcified mitral annulus, mild LAE.  Patient was recently evaluated by GI service for progressive dysphagia over the past several months.  Modified barium swallow performed on 12/10/2017 demonstrated oral and pharyngeal phase within normal limits for ongoing esophageal dysphagia leading to the backflow of the liquids to the larynx was significant aspiration events.  EGD performed on 01/07/2018 revealed gastritis but otherwise unremarkable.  Stomach biopsy revealed chronic inactive gastritis, negative for H. pylori.  Esophageal biopsy was negative for intestinal metaplasia and negative for eosinophilic esophagitis.  Esophageal manometry performed on 01/29/2018 revealed elevated LES resting and residual pressure and failed peristalsis with pending esophageal pressurization consistent with type II achalasia.  Patient was referred to Dr. Margart Sickles of [facility] general surgery who is planning to proceed with laparoscopic surgical  esophago-myomectomy with fundoplasty  Patient presents today for preop clearance.  She is still living [facility].  She has some balance issues at baseline.  However she denies any dizziness when walking on flat ground.  She gently woke up to a mile each day.  She also goes to exercises classes on a weekly basis.  She did some aerobic activity last week and denies any chest pain.  She has baseline dyspnea on exertion, this is chronic and the does not interfere with her lifestyle.  I have reviewed the previous echocardiogram, there is no significant aortic stenosis.  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  I increased her metoprolol to 25 mg twice daily to allow increased LV filling and hopefully more stroke volume.  Otherwise, I have discussed the case with the patient's primary cardiologist Dr. Oval Linsey, patient is cleared to proceed with surgery without any further cardiac workup.  Given the LV outflow tract narrowing, the best way to treat any hypotension in her case would be aggressive IV fluid hydration to increase the volume of the fluid through this narrow area.  This should help with any change in the blood pressure during the perioperative phase.   Past Medical History:  Diagnosis Date  . Anxiety   . Arrhythmia    right bundle branch block  . Bipolar 1 disorder ([place])   . Cancer ([place])   . Hyperlipidemia   . Hypertension   . Morbid obesity ([place])   . Osteoarthritis   . Schizo-affective schizophrenia ([place])   . Thyroid disease    hypothyroidism    Past Surgical History:  Procedure Laterality Date  . BREAST SURGERY    .  CHOLECYSTECTOMY    . DILATION AND CURETTAGE OF UTERUS    . ESOPHAGEAL MANOMETRY N/A 01/29/2018   Procedure: ESOPHAGEAL MANOMETRY (EM);  Surgeon: Wonda Horner, MD;  Location: [facility] ENDOSCOPY;  Service: Endoscopy;  Laterality: N/A;  . HAND SURGERY     Right-trigger finger  . KNEE SURGERY      Left  . TONSILLECTOMY      Current Medications: Outpatient Medications Prior to Visit  Medication Sig Dispense Refill  . Acetaminophen (TYLENOL EXTRA STRENGTH PO) Take by mouth 2 (two) times daily.    Marland Kitchen ALPRAZolam (XANAX) 0.5 MG tablet Take 0.5-2 mg by mouth 4 (four) times daily as needed for anxiety. Patient may take 3-4 tablets by mouth as needed at bedtime for sleep and 1-3 tablets by mouth three times daily as needed for anxiety    . aspirin 81 MG tablet Take 81 mg by mouth daily.    . Calcium Carbonate (CALCIUM 600 PO) Take 1 tablet by mouth daily.    Marland Kitchen co-enzyme Q-10 30 MG capsule Take 30 mg by mouth daily.    Marland Kitchen donepezil (ARICEPT) 10 MG tablet Take 10 mg by mouth every morning.     . fenofibrate (TRICOR) 145 MG tablet TAKE 1 TABLET EVERY TUESDAY,THURSDAY,AND SATURDAY AT 6 PM 15 tablet 5  . lamoTRIgine (LAMICTAL) 150 MG tablet Take 150 mg by mouth 2 (two) times daily.  12  . memantine (NAMENDA) 10 MG tablet Take 10 mg by mouth 2 (two) times daily.      . Multiple Vitamins-Minerals (MULTIVITAMIN ADULT PO) Take 1 tablet by mouth daily.    Marland Kitchen OLANZapine (ZYPREXA) 15 MG tablet Take 30 mg by mouth at bedtime.      Marland Kitchen QUEtiapine (SEROQUEL) 25 MG tablet Take 50 mg by mouth at bedtime.     . ramelteon (ROZEREM) 8 MG tablet Take 8 mg by mouth at bedtime.      Marland Kitchen spironolactone (ALDACTONE) 25 MG tablet TAKE 1 TABLET BY MOUTH DAILY 30 tablet 11  . SYNTHROID 150 MCG tablet TAKE 1 TABLET (150 MCG TOTAL) BY MOUTH DAILY. 30 tablet 6  . metoprolol tartrate (LOPRESSOR) 25 MG tablet TAKE 1 TABLET BY MOUTH EVERY DAY 30 tablet 11  . naproxen sodium (ANAPROX) 220 MG tablet Take 220 mg by mouth 2 (two) times daily with a meal.    . omeprazole (PRILOSEC) 40 MG capsule 1 CAPSULE ONCE A DAY ORALLY 30 DAYS  5  . tolterodine (DETROL) 2 MG tablet Take 2 mg by mouth daily.     No facility-administered medications prior to visit.      Allergies:   Lithium; Fluoxetine; Macrodantin; Mirabegron; Paroxetine hcl;  Paxil [paroxetine]; Prozac [fluoxetine hcl]; Sertraline hcl; Solifenacin; Sulfa antibiotics; and Wellbutrin [bupropion hcl]   Social History   Socioeconomic History  . Marital status: Divorced    Spouse name: Not on file  . Number of children: Not on file  . Years of education: Not on file  . Highest education level: Not on file  Occupational History  . Not on file  Social Needs  . Financial resource strain: Not on file  . Food insecurity:    Worry: Not on file    Inability: Not on file  . Transportation needs:    Medical: Not on file    Non-medical: Not on file  Tobacco Use  . Smoking status: Former Smoker    Last attempt to quit: 12/08/1978    Years since quitting: 39.3  . Smokeless tobacco:  Never Used  Substance and Sexual Activity  . Alcohol use: Yes    Comment: 1 time a year  . Drug use: No  . Sexual activity: Not on file  Lifestyle  . Physical activity:    Days per week: Not on file    Minutes per session: Not on file  . Stress: Not on file  Relationships  . Social connections:    Talks on phone: Not on file    Gets together: Not on file    Attends religious service: Not on file    Active member of club or organization: Not on file    Attends meetings of clubs or organizations: Not on file    Relationship status: Not on file  Other Topics Concern  . Not on file  Social History Narrative  . Not on file     Family History:  The patient's family history includes Hypertension in her mother.   ROS:   Please see the history of present illness.    ROS All other systems reviewed and are negative.   PHYSICAL EXAM:   VS:  BP (!) 150/80 (BP Location: Left Arm, Patient Position: Sitting, Cuff Size: Large)   Pulse 92   Ht 5\' 2"  (1.575 m)   BMI 35.92 kg/m    GEN: Well nourished, well developed, in no acute distress  HEENT: normal  Neck: no JVD, carotid bruits, or masses Cardiac: RRR; no rubs, or gallops,no edema  2/6 holosystolic murmur at apex Respiratory:   clear to auscultation bilaterally, normal work of breathing GI: soft, nontender, nondistended, + BS MS: no deformity or atrophy  Skin: warm and dry, no rash Neuro:  Alert and Oriented x 3, Strength and sensation are intact Psych: euthymic mood, full affect  Wt Readings from Last 3 Encounters:  08/20/17 196 lb 6.4 oz (89.1 kg)  01/09/17 195 lb (88.5 kg)  04/30/16 188 lb 12.8 oz (85.6 kg)      Studies/Labs Reviewed:   EKG:  EKG is ordered today.  The ekg ordered today demonstrates normal sinus rhythm, chronic right bundle branch block.  Recent Labs: No results found for requested labs within last 8760 hours.   Lipid Panel    Component Value Date/Time   CHOL 187 10/10/2015 0933   TRIG 315 (H) 10/10/2015 0933   HDL 36 (L) 10/10/2015 0933   CHOLHDL 5.2 (H) 10/10/2015 0933   VLDL 63 (H) 10/10/2015 0933   LDLCALC 88 10/10/2015 0933   LDLDIRECT 118.0 11/09/2014 0853    Additional studies/ records that were reviewed today include:   Myoview 05/25/2014 Impression Exercise Capacity:  Lexiscan with no exercise. BP Response:  Normal blood pressure response. Clinical Symptoms:  Shortness of breath ECG Impression:  No significant ST segment change suggestive of ischemia. Comparison with Prior Nuclear Study: No significant change from previous study  Overall Impression:  Normal stress nuclear study. This is a low risk scan. There is no scar or ischemia. There is no change from the study of July, 2008  LV Ejection Fraction: 66%.  LV Wall Motion:  Normal Wall Motion.    Echo 02/12/2017 LV EF: 65% -   70% Study Conclusions  - Left ventricle: Narrow LVOT No obstruction at rest. The cavity   size was normal. Wall thickness was increased in a pattern of   mild LVH. Systolic function was vigorous. The estimated ejection   fraction was in the range of 65% to 70%. Doppler parameters are   consistent with abnormal left  ventricular relaxation (grade 1   diastolic dysfunction). -  Aortic valve: There was trivial regurgitation. - Mitral valve: Calcified annulus. Mildly thickened leaflets . - Left atrium: The atrium was mildly dilated.    ASSESSMENT:    1. Preop cardiovascular exam   2. Benign hypertensive heart disease without heart failure   3. Left ventricular outflow tract obstruction   4. Hyperlipidemia, unspecified hyperlipidemia type   5. Dyspnea, unspecified type   6. Alzheimer's dementia without behavioral disturbance, unspecified timing of dementia onset   7. Achalasia      PLAN:  In order of problems listed above:  1. Preoperative clearance: Patient is currently undergoing evaluation by Dr. Margart Sickles of [facility] for achalasia, she has upcoming planned esophago-myomectomy with fundoplasty.  She does not have significant aortic valve disease, although she does have LV outflow tract narrowing, previous echocardiogram in 2018 did not show any outflow tract obstruction at rest.  I will increase her metoprolol to 25 mg twice daily to improve LV filling.  Otherwise, I have discussed the case with Dr. Oval Linsey, she is cleared to proceed with surgery without further cardiac workup.  To help improve cardiac output, she will need IV hydration perioperatively to improve the volume going through the LV outflow tract.  2. Hypertension: I increased her metoprolol from 25 mg daily to 25 mg twice daily to help with LV filling.  3. Hyperlipidemia: On fenofibrate, not on statins.  Annual lipid panel will be deferred to primary care provider.  Last lipid panel in epic system dated back to 2016.  4. Chronic dyspnea: This is unchanged in the past year.  Would encourage continued activity, no need to repeat echocardiogram at this point.  5. Achalasia: See #1, patient planned to undergo esophago-myomectomy with fundoplasty    Medication Adjustments/Labs and Tests Ordered: Current medicines are reviewed at length with the patient today.  Concerns regarding medicines are outlined  above.  Medication changes, Labs and Tests ordered today are listed in the Patient Instructions below. Patient Instructions  You are cleared for surgery! Make sure to stay adequately hydrated throughout the perioperative period.  Amy Deforest, PA-c has recommended making the following medication changes: 1. INCREASE Metoprolol to 25 mg TWICE daily  Your physician recommends that you schedule a follow-up appointment in 6 months with Dr Oval Linsey. You will receive a reminder letter in the mail two months in advance. If you don't receive a letter, please call our office to schedule the follow-up appointment.  If you need a refill on your cardiac medications before your next appointment, please call your pharmacy.    [name], [place]  03/19/2018 2:39 PM    [address], [place], [place]  [address] Phone: [phone]; Fax: [phone]

## 2018-03-18 NOTE — Patient Instructions (Addendum)
You are cleared for surgery! Make sure to stay adequately hydrated throughout the perioperative period.  Almyra Deforest, PA-c has recommended making the following medication changes: 1. INCREASE Metoprolol to 25 mg TWICE daily  Your physician recommends that you schedule a follow-up appointment in 6 months with Dr Oval Linsey. You will receive a reminder letter in the mail two months in advance. If you don't receive a letter, please call our office to schedule the follow-up appointment.  If you need a refill on your cardiac medications before your next appointment, please call your pharmacy.

## 2018-03-19 ENCOUNTER — Encounter: Payer: Self-pay | Admitting: Physician Assistant

## 2018-03-25 ENCOUNTER — Ambulatory Visit: Payer: Medicare HMO | Admitting: Cardiology

## 2018-03-25 DIAGNOSIS — H353112 Nonexudative age-related macular degeneration, right eye, intermediate dry stage: Secondary | ICD-10-CM | POA: Diagnosis not present

## 2018-03-25 DIAGNOSIS — H35373 Puckering of macula, bilateral: Secondary | ICD-10-CM | POA: Diagnosis not present

## 2018-03-25 DIAGNOSIS — H00014 Hordeolum externum left upper eyelid: Secondary | ICD-10-CM | POA: Diagnosis not present

## 2018-03-25 DIAGNOSIS — H40013 Open angle with borderline findings, low risk, bilateral: Secondary | ICD-10-CM | POA: Diagnosis not present

## 2018-03-28 ENCOUNTER — Other Ambulatory Visit: Payer: Self-pay | Admitting: Nurse Practitioner

## 2018-04-02 DIAGNOSIS — E039 Hypothyroidism, unspecified: Secondary | ICD-10-CM | POA: Diagnosis not present

## 2018-04-02 DIAGNOSIS — F319 Bipolar disorder, unspecified: Secondary | ICD-10-CM | POA: Diagnosis not present

## 2018-04-02 DIAGNOSIS — I420 Dilated cardiomyopathy: Secondary | ICD-10-CM | POA: Diagnosis not present

## 2018-04-02 DIAGNOSIS — I10 Essential (primary) hypertension: Secondary | ICD-10-CM | POA: Diagnosis not present

## 2018-04-02 DIAGNOSIS — Z882 Allergy status to sulfonamides status: Secondary | ICD-10-CM | POA: Diagnosis not present

## 2018-04-02 DIAGNOSIS — E785 Hyperlipidemia, unspecified: Secondary | ICD-10-CM | POA: Diagnosis not present

## 2018-04-02 DIAGNOSIS — G309 Alzheimer's disease, unspecified: Secondary | ICD-10-CM | POA: Diagnosis not present

## 2018-04-02 DIAGNOSIS — F0280 Dementia in other diseases classified elsewhere without behavioral disturbance: Secondary | ICD-10-CM | POA: Diagnosis not present

## 2018-04-02 DIAGNOSIS — M1990 Unspecified osteoarthritis, unspecified site: Secondary | ICD-10-CM | POA: Diagnosis not present

## 2018-04-02 DIAGNOSIS — K220 Achalasia of cardia: Secondary | ICD-10-CM | POA: Diagnosis not present

## 2018-04-02 DIAGNOSIS — Z6834 Body mass index (BMI) 34.0-34.9, adult: Secondary | ICD-10-CM | POA: Diagnosis not present

## 2018-04-08 DIAGNOSIS — Z87891 Personal history of nicotine dependence: Secondary | ICD-10-CM | POA: Diagnosis not present

## 2018-04-08 DIAGNOSIS — F319 Bipolar disorder, unspecified: Secondary | ICD-10-CM | POA: Diagnosis not present

## 2018-04-08 DIAGNOSIS — G309 Alzheimer's disease, unspecified: Secondary | ICD-10-CM | POA: Diagnosis not present

## 2018-04-08 DIAGNOSIS — F209 Schizophrenia, unspecified: Secondary | ICD-10-CM | POA: Diagnosis not present

## 2018-04-08 DIAGNOSIS — K220 Achalasia of cardia: Secondary | ICD-10-CM | POA: Diagnosis not present

## 2018-04-08 DIAGNOSIS — F0280 Dementia in other diseases classified elsewhere without behavioral disturbance: Secondary | ICD-10-CM | POA: Diagnosis not present

## 2018-04-08 DIAGNOSIS — I420 Dilated cardiomyopathy: Secondary | ICD-10-CM | POA: Diagnosis not present

## 2018-04-08 DIAGNOSIS — Z6835 Body mass index (BMI) 35.0-35.9, adult: Secondary | ICD-10-CM | POA: Diagnosis not present

## 2018-04-19 DIAGNOSIS — I10 Essential (primary) hypertension: Secondary | ICD-10-CM | POA: Diagnosis not present

## 2018-04-19 DIAGNOSIS — R1310 Dysphagia, unspecified: Secondary | ICD-10-CM | POA: Diagnosis not present

## 2018-04-19 DIAGNOSIS — R269 Unspecified abnormalities of gait and mobility: Secondary | ICD-10-CM | POA: Diagnosis not present

## 2018-04-20 ENCOUNTER — Other Ambulatory Visit: Payer: Self-pay | Admitting: Cardiovascular Disease

## 2018-04-28 DIAGNOSIS — R070 Pain in throat: Secondary | ICD-10-CM | POA: Diagnosis not present

## 2018-04-28 DIAGNOSIS — K224 Dyskinesia of esophagus: Secondary | ICD-10-CM | POA: Diagnosis not present

## 2018-04-29 DIAGNOSIS — M1712 Unilateral primary osteoarthritis, left knee: Secondary | ICD-10-CM | POA: Diagnosis not present

## 2018-04-29 DIAGNOSIS — M545 Low back pain: Secondary | ICD-10-CM | POA: Diagnosis not present

## 2018-05-03 ENCOUNTER — Encounter (HOSPITAL_COMMUNITY): Payer: Self-pay | Admitting: Emergency Medicine

## 2018-05-03 ENCOUNTER — Emergency Department (HOSPITAL_COMMUNITY)
Admission: EM | Admit: 2018-05-03 | Discharge: 2018-05-03 | Disposition: A | Discharge status: Home / Self Care | Payer: Medicare HMO | Attending: Emergency Medicine | Admitting: Emergency Medicine

## 2018-05-03 ENCOUNTER — Other Ambulatory Visit: Payer: Self-pay

## 2018-05-03 DIAGNOSIS — Z853 Personal history of malignant neoplasm of breast: Secondary | ICD-10-CM | POA: Diagnosis not present

## 2018-05-03 DIAGNOSIS — F0390 Unspecified dementia without behavioral disturbance: Secondary | ICD-10-CM | POA: Insufficient documentation

## 2018-05-03 DIAGNOSIS — Z79899 Other long term (current) drug therapy: Secondary | ICD-10-CM | POA: Insufficient documentation

## 2018-05-03 DIAGNOSIS — I119 Hypertensive heart disease without heart failure: Secondary | ICD-10-CM | POA: Diagnosis not present

## 2018-05-03 DIAGNOSIS — Z87891 Personal history of nicotine dependence: Secondary | ICD-10-CM | POA: Insufficient documentation

## 2018-05-03 DIAGNOSIS — N3001 Acute cystitis with hematuria: Secondary | ICD-10-CM | POA: Diagnosis not present

## 2018-05-03 DIAGNOSIS — R350 Frequency of micturition: Secondary | ICD-10-CM | POA: Diagnosis present

## 2018-05-03 DIAGNOSIS — Z7982 Long term (current) use of aspirin: Secondary | ICD-10-CM | POA: Insufficient documentation

## 2018-05-03 LAB — URINALYSIS, ROUTINE W REFLEX MICROSCOPIC
BILIRUBIN URINE: NEGATIVE
Glucose, UA: NEGATIVE mg/dL
Ketones, ur: NEGATIVE mg/dL
Nitrite: NEGATIVE
PH: 7 (ref 5.0–8.0)
Protein, ur: 30 mg/dL — AB
RBC / HPF: >50 RBC/hpf — ABNORMAL HIGH (ref 0–5)
SPECIFIC GRAVITY, URINE: 1.015 (ref 1.005–1.030)
WBC, UA: >50 WBC/hpf — ABNORMAL HIGH (ref 0–5)

## 2018-05-03 MED ORDER — PHENAZOPYRIDINE HCL 200 MG PO TABS: 200.0000 mg | ORAL_TABLET | Freq: Three times a day (TID) | ORAL | 0 refills | Status: DC

## 2018-05-03 MED ORDER — CEPHALEXIN 500 MG PO CAPS: 500.0000 mg | ORAL_CAPSULE | Freq: Four times a day (QID) | ORAL | 0 refills | Status: DC

## 2018-05-03 MED ORDER — PHENAZOPYRIDINE HCL 100 MG PO TABS
200.0000 mg | ORAL_TABLET | Freq: Once | ORAL | Status: AC
  Administered 2018-05-03: 200 mg via ORAL
  Filled 2018-05-03: qty 2

## 2018-05-03 MED ORDER — CEPHALEXIN 250 MG PO CAPS
500.0000 mg | ORAL_CAPSULE | Freq: Once | ORAL | Status: AC
Start: 2018-05-03 — End: 2018-05-03
  Administered 2018-05-03: 500 mg via ORAL
  Filled 2018-05-03: qty 2

## 2018-05-03 NOTE — ED Provider Notes (Signed)
[facility] EMERGENCY DEPARTMENT Provider Note   CSN: [id number] Arrival date & time: 05/03/18  0802     History   Chief Complaint Chief Complaint  Patient presents with  . Urinary Tract Infection    HPI [name] is a 77 y.o. female.  77 yo F with a chief complaint of urinary incontinence and urgency, dysuria increased frequency and hesitancy.  She has had these symptoms previously and was diagnosed with a urinary tract infection.  She thinks that this is the same.  Denies fevers denies flank pain.  Denies abdominal pain.  Had a recent abdominal surgery in [facility] that she is unsure exactly what was performed. 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  No issues since, swallowing well, no noted abdominal pain.  Had it about 4 weeks ago.   The history is provided by the patient.  Urinary Tract Infection   This is a recurrent problem. The current episode started less than 1 hour ago. The problem occurs every urination. The problem has been rapidly improving. The quality of the pain is described as burning and stabbing. The pain is at a severity of 8/10. The pain is moderate. There has been no fever. She is not sexually active. There is no history of pyelonephritis. Associated symptoms include urgency. Pertinent negatives include no chills, no nausea and no vomiting.    Past Medical History:  Diagnosis Date  . Anxiety   . Arrhythmia    right bundle branch block  . Bipolar 1 disorder ([place])   . Cancer ([place])   . Hyperlipidemia   . Hypertension   . Morbid obesity ([place])   . Osteoarthritis   . Schizo-affective schizophrenia ([place])   . Thyroid disease    hypothyroidism    Patient Active Problem List   Diagnosis Date Noted  . Osteoarthritis of left knee 11/09/2014  . Nocturia more than twice per night 11/09/2014  . Chest pain 05/09/2014  . Cough 11/02/2013  . Right bundle branch block 02/23/2013  . Irritable bowel syndrome with constipation and diarrhea  10/28/2012  . Breast cancer ([place]) 04/07/2012  . Dermatitis 12/24/2011  . Hypercholesterolemia 04/30/2011  . Hypothyroidism 04/30/2011  . Dementia 04/30/2011  . Benign hypertensive heart disease without heart failure 04/30/2011    Past Surgical History:  Procedure Laterality Date  . BREAST SURGERY    . CHOLECYSTECTOMY    . DILATION AND CURETTAGE OF UTERUS    . ESOPHAGEAL MANOMETRY N/A 01/29/2018   Procedure: ESOPHAGEAL MANOMETRY (EM);  Surgeon: Wonda Horner, MD;  Location: [facility] ENDOSCOPY;  Service: Endoscopy;  Laterality: N/A;  . HAND SURGERY     Right-trigger finger  . KNEE SURGERY     Left  . TONSILLECTOMY       OB History   None      Home Medications    Prior to Admission medications   Medication Sig Start Date End Date Taking? Authorizing Provider  Acetaminophen (TYLENOL EXTRA STRENGTH PO) Take by mouth 2 (two) times daily.    [provider]  ALPRAZolam Duanne Moron) 0.5 MG tablet Take 0.5-2 mg by mouth 4 (four) times daily as needed for anxiety. Patient may take 3-4 tablets by mouth as needed at bedtime for sleep and 1-3 tablets by mouth three times daily as needed for anxiety    [provider]  aspirin 81 MG tablet Take 81 mg by mouth daily.    [provider]  Calcium Carbonate (CALCIUM 600 PO) Take 1 tablet  by mouth daily.    [provider]  cephALEXin (KEFLEX) 500 MG capsule Take 1 capsule (500 mg total) by mouth 4 (four) times daily. 05/03/18   Deno Etienne, DO  co-enzyme Q-10 30 MG capsule Take 30 mg by mouth daily.    [provider]  donepezil (ARICEPT) 10 MG tablet Take 10 mg by mouth every morning.     [provider]  fenofibrate (TRICOR) 145 MG tablet TAKE 1 TABLET EVERY TUESDAY,THURSDAY,AND SATURDAY AT 6 PM 04/20/18   Skeet Latch, MD  lamoTRIgine (LAMICTAL) 150 MG tablet Take 150 mg by mouth 2 (two) times daily. 04/06/16   [provider]  memantine (NAMENDA) 10 MG tablet Take 10 mg by mouth 2 (two)  times daily.      [provider]  metoprolol tartrate (LOPRESSOR) 25 MG tablet Take 1 tablet (25 mg total) by mouth 2 (two) times daily. 03/18/18   Almyra Deforest, PA  Multiple Vitamins-Minerals (MULTIVITAMIN ADULT PO) Take 1 tablet by mouth daily.    [provider]  OLANZapine (ZYPREXA) 15 MG tablet Take 30 mg by mouth at bedtime.      [provider]  phenazopyridine (PYRIDIUM) 200 MG tablet Take 1 tablet (200 mg total) by mouth 3 (three) times daily. 05/03/18   Deno Etienne, DO  QUEtiapine (SEROQUEL) 25 MG tablet Take 50 mg by mouth at bedtime.     [provider]  ramelteon (ROZEREM) 8 MG tablet Take 8 mg by mouth at bedtime.      [provider]  spironolactone (ALDACTONE) 25 MG tablet TAKE 1 TABLET BY MOUTH DAILY 03/29/18   Burtis Junes, NP  SYNTHROID 150 MCG tablet TAKE 1 TABLET (150 MCG TOTAL) BY MOUTH DAILY. 10/23/15   Darlin Coco, MD    Family History Family History  Problem Relation Age of Onset  . Hypertension Mother     Social History Social History   Tobacco Use  . Smoking status: Former Smoker    Last attempt to quit: 12/08/1978    Years since quitting: 39.4  . Smokeless tobacco: Never Used  Substance Use Topics  . Alcohol use: Yes    Comment: 1 time a year  . Drug use: No     Allergies   Lithium; Fluoxetine; Macrodantin; Mirabegron; Paroxetine hcl; Paxil [paroxetine]; Prozac [fluoxetine hcl]; Sertraline hcl; Solifenacin; Sulfa antibiotics; and Wellbutrin [bupropion hcl]   Review of Systems Review of Systems  Constitutional: Negative for chills and fever.  HENT: Negative for congestion and rhinorrhea.   Eyes: Negative for redness and visual disturbance.  Respiratory: Negative for shortness of breath and wheezing.   Cardiovascular: Negative for chest pain and palpitations.  Gastrointestinal: Negative for nausea and vomiting.  Genitourinary: Positive for difficulty urinating, dysuria and urgency.  Musculoskeletal:  Negative for arthralgias and myalgias.  Skin: Negative for pallor and wound.  Neurological: Negative for dizziness and headaches.     Physical Exam Updated Vital Signs BP (!) 146/68 (BP Location: Right Arm)   Pulse 82   Temp 97.9 F (36.6 C) (Oral)   Resp 20   SpO2 94%   Physical Exam  Constitutional: She is oriented to person, place, and time. She appears well-developed and well-nourished. No distress.  HENT:  Head: Normocephalic and atraumatic.  Eyes: Pupils are equal, round, and reactive to light. EOM are normal.  Neck: Normal range of motion. Neck supple.  Cardiovascular: Normal rate and regular rhythm. Exam reveals no gallop and no friction rub.  No murmur  heard. Pulmonary/Chest: Effort normal. She has no wheezes. She has no rales.  Abdominal: Soft. She exhibits no distension and no mass. There is tenderness (mild suprapubic). There is no guarding.  Musculoskeletal: She exhibits no edema or tenderness.  Neurological: She is alert and oriented to person, place, and time.  Skin: Skin is warm and dry. She is not diaphoretic.  Psychiatric: She has a normal mood and affect. Her behavior is normal.  Nursing note and vitals reviewed.    ED Treatments / Results  Labs (all labs ordered are listed, but only abnormal results are displayed) Labs Reviewed  URINALYSIS, ROUTINE W REFLEX MICROSCOPIC - Abnormal; Notable for the following components:      Result Value   APPearance HAZY (*)    Hgb urine dipstick LARGE (*)    Protein, ur 30 (*)    Leukocytes, UA LARGE (*)    RBC / HPF >50 (*)    WBC, UA >50 (*)    Bacteria, UA RARE (*)    All other components within normal limits    EKG None  Radiology No results found.  Procedures Procedures (including critical care time)  Medications Ordered in ED Medications  phenazopyridine (PYRIDIUM) tablet 200 mg (has no administration in time range)  cephALEXin (KEFLEX) capsule 500 mg (has no administration in time range)      Initial Impression / Assessment and Plan / ED Course  I have reviewed the triage vital signs and the nursing notes.  Pertinent labs & imaging results that were available during my care of the patient were reviewed by me and considered in my medical decision making (see chart for details).     77 yo with a chief complaint of dysuria increased frequency and hesitancy.  Will obtain a UA.  She has some mild suprapubic tenderness on exam.  No flank pain no fevers.    UA with Larg leuks, TNT whites. Will treat for UTI with symptoms.  D/c home.   9:23 AM:  I have discussed the diagnosis/risks/treatment options with the patient and Friend and believe the pt to be eligible for discharge home to follow-up with PCP. We also discussed returning to the ED immediately if new or worsening sx occur. We discussed the sx which are most concerning (e.g., sudden worsening pain, fever, inability to tolerate by mouth) that necessitate immediate return. Medications administered to the patient during their visit and any new prescriptions provided to the patient are listed below.  Medications given during this visit Medications  phenazopyridine (PYRIDIUM) tablet 200 mg (has no administration in time range)  cephALEXin (KEFLEX) capsule 500 mg (has no administration in time range)     The patient appears reasonably screen and/or stabilized for discharge and I doubt any other medical condition or other [facility] requiring further screening, evaluation, or treatment in the ED at this time prior to discharge.    Final Clinical Impressions(s) / ED Diagnoses   Final diagnoses:  Acute cystitis with hematuria    ED Discharge Orders        Ordered    cephALEXin (KEFLEX) 500 MG capsule  4 times daily     05/03/18 0915    phenazopyridine (PYRIDIUM) 200 MG tablet  3 times daily     05/03/18 0915       Deno Etienne, DO 05/03/18 [phone]

## 2018-05-03 NOTE — ED Triage Notes (Signed)
Pt states she woke up this morning and urinated in the bed. Pt went to restroom and continued to urinate- states that it was painful and has been frequently urinating. Pt states now she only has a few drops when she urinates but feels the urge.

## 2018-05-11 DIAGNOSIS — M545 Low back pain: Secondary | ICD-10-CM | POA: Diagnosis not present

## 2018-05-11 DIAGNOSIS — I10 Essential (primary) hypertension: Secondary | ICD-10-CM | POA: Diagnosis not present

## 2018-05-11 DIAGNOSIS — R1310 Dysphagia, unspecified: Secondary | ICD-10-CM | POA: Diagnosis not present

## 2018-05-11 DIAGNOSIS — N390 Urinary tract infection, site not specified: Secondary | ICD-10-CM | POA: Diagnosis not present

## 2018-05-11 DIAGNOSIS — G8929 Other chronic pain: Secondary | ICD-10-CM | POA: Diagnosis not present

## 2018-05-14 DIAGNOSIS — M545 Low back pain: Secondary | ICD-10-CM | POA: Diagnosis not present

## 2018-05-14 DIAGNOSIS — M6281 Muscle weakness (generalized): Secondary | ICD-10-CM | POA: Diagnosis not present

## 2018-05-14 DIAGNOSIS — M25562 Pain in left knee: Secondary | ICD-10-CM | POA: Diagnosis not present

## 2018-05-14 DIAGNOSIS — R2681 Unsteadiness on feet: Secondary | ICD-10-CM | POA: Diagnosis not present

## 2018-05-18 DIAGNOSIS — M25562 Pain in left knee: Secondary | ICD-10-CM | POA: Diagnosis not present

## 2018-05-18 DIAGNOSIS — R2681 Unsteadiness on feet: Secondary | ICD-10-CM | POA: Diagnosis not present

## 2018-05-18 DIAGNOSIS — M545 Low back pain: Secondary | ICD-10-CM | POA: Diagnosis not present

## 2018-05-18 DIAGNOSIS — M6281 Muscle weakness (generalized): Secondary | ICD-10-CM | POA: Diagnosis not present

## 2018-05-21 DIAGNOSIS — M25562 Pain in left knee: Secondary | ICD-10-CM | POA: Diagnosis not present

## 2018-05-21 DIAGNOSIS — M6281 Muscle weakness (generalized): Secondary | ICD-10-CM | POA: Diagnosis not present

## 2018-05-21 DIAGNOSIS — R2681 Unsteadiness on feet: Secondary | ICD-10-CM | POA: Diagnosis not present

## 2018-05-21 DIAGNOSIS — M545 Low back pain: Secondary | ICD-10-CM | POA: Diagnosis not present

## 2018-05-23 ENCOUNTER — Emergency Department (HOSPITAL_COMMUNITY): Payer: Medicare HMO

## 2018-05-23 ENCOUNTER — Encounter (HOSPITAL_COMMUNITY): Payer: Self-pay | Admitting: Emergency Medicine

## 2018-05-23 ENCOUNTER — Emergency Department (HOSPITAL_COMMUNITY)
Admission: EM | Admit: 2018-05-23 | Discharge: 2018-05-23 | Disposition: A | Discharge status: Home / Self Care | Payer: Medicare HMO | Attending: Emergency Medicine | Admitting: Emergency Medicine

## 2018-05-23 ENCOUNTER — Other Ambulatory Visit: Payer: Self-pay

## 2018-05-23 DIAGNOSIS — R05 Cough: Secondary | ICD-10-CM | POA: Diagnosis not present

## 2018-05-23 DIAGNOSIS — M7918 Myalgia, other site: Secondary | ICD-10-CM | POA: Diagnosis present

## 2018-05-23 DIAGNOSIS — E039 Hypothyroidism, unspecified: Secondary | ICD-10-CM | POA: Diagnosis not present

## 2018-05-23 DIAGNOSIS — Z79899 Other long term (current) drug therapy: Secondary | ICD-10-CM | POA: Diagnosis not present

## 2018-05-23 DIAGNOSIS — I119 Hypertensive heart disease without heart failure: Secondary | ICD-10-CM | POA: Insufficient documentation

## 2018-05-23 DIAGNOSIS — B9789 Other viral agents as the cause of diseases classified elsewhere: Secondary | ICD-10-CM

## 2018-05-23 DIAGNOSIS — J069 Acute upper respiratory infection, unspecified: Secondary | ICD-10-CM | POA: Insufficient documentation

## 2018-05-23 DIAGNOSIS — Z7982 Long term (current) use of aspirin: Secondary | ICD-10-CM | POA: Diagnosis not present

## 2018-05-23 DIAGNOSIS — Z87891 Personal history of nicotine dependence: Secondary | ICD-10-CM | POA: Diagnosis not present

## 2018-05-23 LAB — I-STAT CHEM 8, ED
BUN: 8 mg/dL (ref 6–20)
CREATININE: 0.9 mg/dL (ref 0.44–1.00)
Calcium, Ion: 1.17 mmol/L (ref 1.15–1.40)
Chloride: 106 mmol/L (ref 101–111)
Glucose, Bld: 103 mg/dL — ABNORMAL HIGH (ref 65–99)
HEMATOCRIT: 40 % (ref 36.0–46.0)
HEMOGLOBIN: 13.6 g/dL (ref 12.0–15.0)
Potassium: 3.6 mmol/L (ref 3.5–5.1)
SODIUM: 142 mmol/L (ref 135–145)
TCO2: 23 mmol/L (ref 22–32)

## 2018-05-23 MED ORDER — BENZONATATE 100 MG PO CAPS: 100.0000 mg | ORAL_CAPSULE | Freq: Three times a day (TID) | ORAL | 0 refills | Status: DC

## 2018-05-23 NOTE — ED Notes (Signed)
Pt transported to xray in NAD.

## 2018-05-23 NOTE — ED Provider Notes (Signed)
[facility] EMERGENCY DEPARTMENT Provider Note   CSN: [id number] Arrival date & time: 05/23/18  7654     History   Chief Complaint Chief Complaint  Patient presents with  . Generalized Body Aches  . Cough    HPI [name] is a 77 y.o. female possible history of bipolar, hyperlipidemia, hypertension who presents for evaluation of 2 days of dry cough, nasal congestion, rhinorrhea, generalized body aches.  Patient reports that yesterday, she started having some mild sore throat.  She was able to tolerate p.o. in her secretions without any difficulty.  Patient also reports yesterday she felt some subjective fevers but states she did not measure her temperature.  Patient reports she took Tylenol and ibuprofen and felt better.  Patient reports that today, sore throat has improved but states she still having cough.  Patient's concerned about an upper respiratory infection.  Denies any chest pain, difficulty breathing, abdominal pain, nausea/vomiting.  The history is provided by the patient.    Past Medical History:  Diagnosis Date  . Anxiety   . Arrhythmia    right bundle branch block  . Bipolar 1 disorder ([place])   . Cancer ([place])   . Hyperlipidemia   . Hypertension   . Morbid obesity ([place])   . Osteoarthritis   . Schizo-affective schizophrenia ([place])   . Thyroid disease    hypothyroidism    Patient Active Problem List   Diagnosis Date Noted  . Osteoarthritis of left knee 11/09/2014  . Nocturia more than twice per night 11/09/2014  . Chest pain 05/09/2014  . Cough 11/02/2013  . Right bundle branch block 02/23/2013  . Irritable bowel syndrome with constipation and diarrhea 10/28/2012  . Breast cancer ([place]) 04/07/2012  . Dermatitis 12/24/2011  . Hypercholesterolemia 04/30/2011  . Hypothyroidism 04/30/2011  . Dementia 04/30/2011  . Benign hypertensive heart disease without heart failure 04/30/2011    Past Surgical History:  Procedure Laterality Date  .  BREAST SURGERY    . CHOLECYSTECTOMY    . DILATION AND CURETTAGE OF UTERUS    . ESOPHAGEAL MANOMETRY N/A 01/29/2018   Procedure: ESOPHAGEAL MANOMETRY (EM);  Surgeon: Wonda Horner, MD;  Location: [facility] ENDOSCOPY;  Service: Endoscopy;  Laterality: N/A;  . HAND SURGERY     Right-trigger finger  . KNEE SURGERY     Left  . TONSILLECTOMY       OB History   None      Home Medications    Prior to Admission medications   Medication Sig Start Date End Date Taking? Authorizing Provider  Acetaminophen (TYLENOL EXTRA STRENGTH PO) Take by mouth 2 (two) times daily.    [provider]  ALPRAZolam Duanne Moron) 0.5 MG tablet Take 0.5-2 mg by mouth 4 (four) times daily as needed for anxiety. Patient may take 3-4 tablets by mouth as needed at bedtime for sleep and 1-3 tablets by mouth three times daily as needed for anxiety    [provider]  aspirin 81 MG tablet Take 81 mg by mouth daily.    [provider]  benzonatate (TESSALON) 100 MG capsule Take 1 capsule (100 mg total) by mouth every 8 (eight) hours. 05/23/18   Volanda Napoleon, PA-C  Calcium Carbonate (CALCIUM 600 PO) Take 1 tablet by mouth daily.    [provider]  cephALEXin (KEFLEX) 500 MG capsule Take 1 capsule (500 mg total) by mouth 4 (four) times daily. 05/03/18   Deno Etienne, DO  co-enzyme Q-10 30 MG capsule  Take 30 mg by mouth daily.    [provider]  donepezil (ARICEPT) 10 MG tablet Take 10 mg by mouth every morning.     [provider]  fenofibrate (TRICOR) 145 MG tablet TAKE 1 TABLET EVERY TUESDAY,THURSDAY,AND SATURDAY AT 6 PM 04/20/18   Skeet Latch, MD  lamoTRIgine (LAMICTAL) 150 MG tablet Take 150 mg by mouth 2 (two) times daily. 04/06/16   [provider]  memantine (NAMENDA) 10 MG tablet Take 10 mg by mouth 2 (two) times daily.      [provider]  metoprolol tartrate (LOPRESSOR) 25 MG tablet Take 1 tablet (25 mg total) by mouth 2 (two) times daily. 03/18/18    Almyra Deforest, PA  Multiple Vitamins-Minerals (MULTIVITAMIN ADULT PO) Take 1 tablet by mouth daily.    [provider]  OLANZapine (ZYPREXA) 15 MG tablet Take 30 mg by mouth at bedtime.      [provider]  phenazopyridine (PYRIDIUM) 200 MG tablet Take 1 tablet (200 mg total) by mouth 3 (three) times daily. 05/03/18   Deno Etienne, DO  QUEtiapine (SEROQUEL) 25 MG tablet Take 50 mg by mouth at bedtime.     [provider]  ramelteon (ROZEREM) 8 MG tablet Take 8 mg by mouth at bedtime.      [provider]  spironolactone (ALDACTONE) 25 MG tablet TAKE 1 TABLET BY MOUTH DAILY 03/29/18   Burtis Junes, NP  SYNTHROID 150 MCG tablet TAKE 1 TABLET (150 MCG TOTAL) BY MOUTH DAILY. 10/23/15   Darlin Coco, MD    Family History Family History  Problem Relation Age of Onset  . Hypertension Mother     Social History Social History   Tobacco Use  . Smoking status: Former Smoker    Last attempt to quit: 12/08/1978    Years since quitting: 39.4  . Smokeless tobacco: Never Used  Substance Use Topics  . Alcohol use: Yes    Comment: 1 time a year  . Drug use: No     Allergies   Lithium; Fluoxetine; Macrodantin; Mirabegron; Paroxetine hcl; Paxil [paroxetine]; Prozac [fluoxetine hcl]; Sertraline hcl; Solifenacin; Sulfa antibiotics; and Wellbutrin [bupropion hcl]   Review of Systems Review of Systems  Constitutional: Positive for fever (subjective).  HENT: Positive for congestion and rhinorrhea.   Respiratory: Positive for cough. Negative for shortness of breath.   Cardiovascular: Negative for chest pain.  Gastrointestinal: Negative for abdominal pain, nausea and vomiting.     Physical Exam Updated Vital Signs BP (!) 119/57 (BP Location: Left Arm)   Pulse 70   Temp 98.3 F (36.8 C) (Oral)   Resp 18   SpO2 95%   Physical Exam  Constitutional: She appears well-developed and well-nourished.  HENT:  Head: Normocephalic and atraumatic.  Mouth/Throat:  Uvula is midline and mucous membranes are normal. No trismus in the jaw. Posterior oropharyngeal erythema present.  Airway patent, phonation intact.  Posterior oropharynx slightly erythematous.  No edema, exudates.  Uvula is midline.  No trismus.  Eyes: Conjunctivae and EOM are normal. Right eye exhibits no discharge. Left eye exhibits no discharge. No scleral icterus.  Pulmonary/Chest: Effort normal. She has no wheezes. She has rales.  Mild crackles noted to the left lung fields.   Neurological: She is alert.  Skin: Skin is warm and dry.  Psychiatric: She has a normal mood and affect. Her speech is normal and behavior is normal.  Nursing note and vitals reviewed.    ED Treatments / Results  Labs (all  labs ordered are listed, but only abnormal results are displayed) Labs Reviewed  I-STAT CHEM 8, ED - Abnormal; Notable for the following components:      Result Value   Glucose, Bld 103 (*)    All other components within normal limits    EKG None  Radiology Dg Chest 2 View  Result Date: 05/23/2018 CLINICAL DATA:  Dry cough EXAM: CHEST - 2 VIEW COMPARISON:  11/11/2013 FINDINGS: Low lung volumes with bibasilar atelectasis. Heart size is borderline. No effusions or acute bony abnormality. IMPRESSION: Bibasilar atelectasis. Electronically Signed   By: Rolm Baptise M.D.   On: 05/23/2018 10:15    Procedures Procedures (including critical care time)  Medications Ordered in ED Medications - No data to display   Initial Impression / Assessment and Plan / ED Course  I have reviewed the triage vital signs and the nursing notes.  Pertinent labs & imaging results that were available during my care of the patient were reviewed by me and considered in my medical decision making (see chart for details).     77 year old female who presents for evaluation of 2 days of nasal congestion, rhinorrhea.  Also had some sore throat, dry cough.  Reports feeling that she had subjective fever yesterday  but did not measure temperature.  No vomiting, chest pain, difficulty breathing. Patient is afebrile, non-toxic appearing, sitting comfortably on examination table. Vital signs reviewed and stable.  On exam, posterior pharynx is slightly erythematous.  Exam not concerning for pharyngitis, Ludwig angina, peritonsillar abscess.  No evidence of respiratory distress.  Mild crackles noted, particular the left lung fields.  No wheezing noted.  Consider upper respiratory infection versus bronchitis.  Low suspicion for pneumonia but also consideration.  Plan to check chest x-ray.  Chest x-ray reviewed.  Shows bibasilar atelectasis but otherwise no evidence of consolidation.  Discussed results with patient.  Plan to treat as viral URI with cough.  Discussed with patient regarding supportive at home therapies.  Encourage follow-up with primary care doctor. Patient had ample opportunity for questions and discussion. All patient's questions were answered with full understanding. Strict return precautions discussed. Patient expresses understanding and agreement to plan.   Final Clinical Impressions(s) / ED Diagnoses   Final diagnoses:  Viral URI with cough    ED Discharge Orders        Ordered    benzonatate (TESSALON) 100 MG capsule  Every 8 hours     05/23/18 1055       Volanda Napoleon, PA-C 05/23/18 1121    Varney Biles, MD 05/24/18 [phone]

## 2018-05-23 NOTE — ED Triage Notes (Addendum)
Pt reports dry cough, sore throat and generalized body aches x2 days. Pt states she felt febrile yesterday, took some tylenol and felt better. Also reports gargling with salt water to help with sore throat.

## 2018-05-23 NOTE — Discharge Instructions (Signed)
You can take the [facility] as directed.  Make sure you are staying hydrated drinking plenty of fluids.  Follow-up with your primary care doctor in 2 to 4 days for further evaluation.  Return to the emergency department for any fever, difficulty breathing, vomiting, chest pain or any other worsening concerning symptoms.

## 2018-05-28 DIAGNOSIS — R2681 Unsteadiness on feet: Secondary | ICD-10-CM | POA: Diagnosis not present

## 2018-05-28 DIAGNOSIS — M25562 Pain in left knee: Secondary | ICD-10-CM | POA: Diagnosis not present

## 2018-05-28 DIAGNOSIS — M6281 Muscle weakness (generalized): Secondary | ICD-10-CM | POA: Diagnosis not present

## 2018-05-28 DIAGNOSIS — M545 Low back pain: Secondary | ICD-10-CM | POA: Diagnosis not present

## 2018-06-01 DIAGNOSIS — M25562 Pain in left knee: Secondary | ICD-10-CM | POA: Diagnosis not present

## 2018-06-01 DIAGNOSIS — M6281 Muscle weakness (generalized): Secondary | ICD-10-CM | POA: Diagnosis not present

## 2018-06-01 DIAGNOSIS — M545 Low back pain: Secondary | ICD-10-CM | POA: Diagnosis not present

## 2018-06-01 DIAGNOSIS — R2681 Unsteadiness on feet: Secondary | ICD-10-CM | POA: Diagnosis not present

## 2018-06-03 DIAGNOSIS — M545 Low back pain: Secondary | ICD-10-CM | POA: Diagnosis not present

## 2018-06-03 DIAGNOSIS — R2681 Unsteadiness on feet: Secondary | ICD-10-CM | POA: Diagnosis not present

## 2018-06-03 DIAGNOSIS — M25562 Pain in left knee: Secondary | ICD-10-CM | POA: Diagnosis not present

## 2018-06-03 DIAGNOSIS — M6281 Muscle weakness (generalized): Secondary | ICD-10-CM | POA: Diagnosis not present

## 2018-06-08 DIAGNOSIS — M25562 Pain in left knee: Secondary | ICD-10-CM | POA: Diagnosis not present

## 2018-06-08 DIAGNOSIS — R2681 Unsteadiness on feet: Secondary | ICD-10-CM | POA: Diagnosis not present

## 2018-06-08 DIAGNOSIS — M6281 Muscle weakness (generalized): Secondary | ICD-10-CM | POA: Diagnosis not present

## 2018-06-08 DIAGNOSIS — M545 Low back pain: Secondary | ICD-10-CM | POA: Diagnosis not present

## 2018-06-11 DIAGNOSIS — R2681 Unsteadiness on feet: Secondary | ICD-10-CM | POA: Diagnosis not present

## 2018-06-11 DIAGNOSIS — M25562 Pain in left knee: Secondary | ICD-10-CM | POA: Diagnosis not present

## 2018-06-11 DIAGNOSIS — M545 Low back pain: Secondary | ICD-10-CM | POA: Diagnosis not present

## 2018-06-11 DIAGNOSIS — M6281 Muscle weakness (generalized): Secondary | ICD-10-CM | POA: Diagnosis not present

## 2018-06-15 DIAGNOSIS — M6281 Muscle weakness (generalized): Secondary | ICD-10-CM | POA: Diagnosis not present

## 2018-06-15 DIAGNOSIS — R2681 Unsteadiness on feet: Secondary | ICD-10-CM | POA: Diagnosis not present

## 2018-06-15 DIAGNOSIS — M25562 Pain in left knee: Secondary | ICD-10-CM | POA: Diagnosis not present

## 2018-06-15 DIAGNOSIS — M545 Low back pain: Secondary | ICD-10-CM | POA: Diagnosis not present

## 2018-06-18 DIAGNOSIS — M545 Low back pain: Secondary | ICD-10-CM | POA: Diagnosis not present

## 2018-06-18 DIAGNOSIS — M25562 Pain in left knee: Secondary | ICD-10-CM | POA: Diagnosis not present

## 2018-06-18 DIAGNOSIS — M6281 Muscle weakness (generalized): Secondary | ICD-10-CM | POA: Diagnosis not present

## 2018-06-18 DIAGNOSIS — R2681 Unsteadiness on feet: Secondary | ICD-10-CM | POA: Diagnosis not present

## 2018-06-22 DIAGNOSIS — M8580 Other specified disorders of bone density and structure, unspecified site: Secondary | ICD-10-CM | POA: Diagnosis not present

## 2018-06-22 DIAGNOSIS — Z6832 Body mass index (BMI) 32.0-32.9, adult: Secondary | ICD-10-CM | POA: Diagnosis not present

## 2018-06-22 DIAGNOSIS — M25562 Pain in left knee: Secondary | ICD-10-CM | POA: Diagnosis not present

## 2018-06-22 DIAGNOSIS — M6281 Muscle weakness (generalized): Secondary | ICD-10-CM | POA: Diagnosis not present

## 2018-06-22 DIAGNOSIS — Z01419 Encounter for gynecological examination (general) (routine) without abnormal findings: Secondary | ICD-10-CM | POA: Diagnosis not present

## 2018-06-22 DIAGNOSIS — M545 Low back pain: Secondary | ICD-10-CM | POA: Diagnosis not present

## 2018-06-22 DIAGNOSIS — R2681 Unsteadiness on feet: Secondary | ICD-10-CM | POA: Diagnosis not present

## 2018-06-22 DIAGNOSIS — C50911 Malignant neoplasm of unspecified site of right female breast: Secondary | ICD-10-CM | POA: Diagnosis not present

## 2018-06-25 DIAGNOSIS — M25562 Pain in left knee: Secondary | ICD-10-CM | POA: Diagnosis not present

## 2018-06-25 DIAGNOSIS — R2681 Unsteadiness on feet: Secondary | ICD-10-CM | POA: Diagnosis not present

## 2018-06-25 DIAGNOSIS — M6281 Muscle weakness (generalized): Secondary | ICD-10-CM | POA: Diagnosis not present

## 2018-06-25 DIAGNOSIS — M545 Low back pain: Secondary | ICD-10-CM | POA: Diagnosis not present

## 2018-06-26 ENCOUNTER — Ambulatory Visit (INDEPENDENT_AMBULATORY_CARE_PROVIDER_SITE_OTHER): Payer: Medicare HMO

## 2018-06-26 ENCOUNTER — Encounter (HOSPITAL_COMMUNITY): Payer: Self-pay | Admitting: *Deleted

## 2018-06-26 ENCOUNTER — Ambulatory Visit (HOSPITAL_COMMUNITY)
Admission: EM | Admit: 2018-06-26 | Discharge: 2018-06-26 | Disposition: A | Discharge status: Home / Self Care | Payer: Medicare HMO | Source: Home / Self Care | Attending: Family Medicine | Admitting: Family Medicine

## 2018-06-26 ENCOUNTER — Encounter (HOSPITAL_COMMUNITY): Payer: Self-pay | Admitting: Emergency Medicine

## 2018-06-26 ENCOUNTER — Ambulatory Visit (HOSPITAL_COMMUNITY): Payer: Medicare HMO

## 2018-06-26 ENCOUNTER — Other Ambulatory Visit: Payer: Self-pay

## 2018-06-26 ENCOUNTER — Emergency Department (HOSPITAL_COMMUNITY)
Admission: EM | Admit: 2018-06-26 | Discharge: 2018-06-26 | Disposition: A | Discharge status: Home / Self Care | Payer: Medicare HMO | Attending: Emergency Medicine | Admitting: Emergency Medicine

## 2018-06-26 DIAGNOSIS — J90 Pleural effusion, not elsewhere classified: Secondary | ICD-10-CM | POA: Insufficient documentation

## 2018-06-26 DIAGNOSIS — Z87891 Personal history of nicotine dependence: Secondary | ICD-10-CM | POA: Insufficient documentation

## 2018-06-26 DIAGNOSIS — Z79899 Other long term (current) drug therapy: Secondary | ICD-10-CM | POA: Insufficient documentation

## 2018-06-26 DIAGNOSIS — I10 Essential (primary) hypertension: Secondary | ICD-10-CM | POA: Diagnosis not present

## 2018-06-26 DIAGNOSIS — Z7982 Long term (current) use of aspirin: Secondary | ICD-10-CM | POA: Diagnosis not present

## 2018-06-26 DIAGNOSIS — R0602 Shortness of breath: Secondary | ICD-10-CM | POA: Diagnosis not present

## 2018-06-26 DIAGNOSIS — E039 Hypothyroidism, unspecified: Secondary | ICD-10-CM | POA: Diagnosis not present

## 2018-06-26 LAB — BASIC METABOLIC PANEL
ANION GAP: 14 (ref 5–15)
BUN: 9 mg/dL (ref 8–23)
CHLORIDE: 105 mmol/L (ref 98–111)
CO2: 24 mmol/L (ref 22–32)
Calcium: 10.2 mg/dL (ref 8.9–10.3)
Creatinine, Ser: 0.87 mg/dL (ref 0.44–1.00)
GFR calc non Af Amer: >60 mL/min (ref >60)
GLUCOSE: 119 mg/dL — AB (ref 70–99)
Potassium: 4.2 mmol/L (ref 3.5–5.1)
Sodium: 143 mmol/L (ref 135–145)

## 2018-06-26 LAB — CBC
HCT: 44.7 % (ref 36.0–46.0)
HEMOGLOBIN: 14.3 g/dL (ref 12.0–15.0)
MCH: 30.7 pg (ref 26.0–34.0)
MCHC: 32 g/dL (ref 30.0–36.0)
MCV: 95.9 fL (ref 78.0–100.0)
Platelets: 296 10*3/uL (ref 150–400)
RBC: 4.66 MIL/uL (ref 3.87–5.11)
RDW: 13.3 % (ref 11.5–15.5)
WBC: 5.5 10*3/uL (ref 4.0–10.5)

## 2018-06-26 LAB — HEPATIC FUNCTION PANEL
ALBUMIN: 4.4 g/dL (ref 3.5–5.0)
ALK PHOS: 33 U/L — AB (ref 38–126)
ALT: 19 U/L (ref 0–44)
AST: 24 U/L (ref 15–41)
Bilirubin, Direct: 0.1 mg/dL (ref 0.0–0.2)
Indirect Bilirubin: 0.4 mg/dL (ref 0.3–0.9)
Total Bilirubin: 0.5 mg/dL (ref 0.3–1.2)
Total Protein: 7.6 g/dL (ref 6.5–8.1)

## 2018-06-26 LAB — I-STAT TROPONIN, ED: Troponin i, poc: 0.02 ng/mL (ref 0.00–0.08)

## 2018-06-26 MED ORDER — IPRATROPIUM-ALBUTEROL 0.5-2.5 (3) MG/3ML IN SOLN
RESPIRATORY_TRACT | Status: AC
  Filled 2018-06-26: qty 3

## 2018-06-26 MED ORDER — IPRATROPIUM-ALBUTEROL 0.5-2.5 (3) MG/3ML IN SOLN: 3.0000 mL | Freq: Once | RESPIRATORY_TRACT | Status: DC

## 2018-06-26 NOTE — ED Triage Notes (Signed)
Pt presents with worsening SOB over course of 1 wk; was seen at [facility] and noted to have fluid on R lung; pt denies CP at this time; pt reporting difficulty breathing with activity but now worse even at rest; BLE edema noted in triage

## 2018-06-26 NOTE — ED Notes (Signed)
Pt ambulated to the restroom with minimal assistance. Pt oxygen saturations were between 88-94% while ambulating.

## 2018-06-26 NOTE — Discharge Instructions (Signed)
Given chest x-ray results, please go to the emergency department for further evaluation.

## 2018-06-26 NOTE — ED Triage Notes (Signed)
Pt was seen in ED 05/23/18 for viral URI; had sxs x approx 3.5 wks, then improved.  Over past 6 days has become SOB.  States her PCP called in albuterol Rx for her, which pt has been taking, without significant relief.

## 2018-06-26 NOTE — ED Notes (Signed)
Ed Triage RN, Angela Nevin, notified of pt.

## 2018-06-26 NOTE — ED Notes (Signed)
Pt explained she goes to PT weekly, and has been told repeatedly that her SaO2 is  91%.

## 2018-06-26 NOTE — ED Provider Notes (Signed)
[place]    CSN: [id number] Arrival date & time: 06/26/18  1523     History   Chief Complaint Chief Complaint  Patient presents with  . Shortness of Breath    HPI [name] is a 77 y.o. female.   77 year old female with history of right bundle branch block, bipolar disorder, breast cancer, HLD, HTN, schizoaffective schizophrenia, hypothyroidism comes in for 6-day history of shortness of breath.  Patient was seen at the emergency department 05/23/2018 for viral URI, at that time chest x-ray was negative.  States symptoms lasted for about 3.5 weeks, then improved.  For the past 6 weeks, has had shortness of breath, without obvious wheezing, chest pain, palpitation.  Denies weakness, dizziness, syncope.  Denies fever, chills, night sweats.  Denies URI symptoms at this cough, congestion,  sore throat.  She called her PCP, and was called in albuterol.  States has had good relief in the beginning, but now with no relief.  States no history of asthma. Former smoker.  Denies long travel/immobility, hormone use.  Denies history of blood clots.  States has had murmur as a child, right bundle branch block, otherwise no significant heart disease.  Denies leg swelling, weight gain.     Past Medical History:  Diagnosis Date  . Anxiety   . Arrhythmia    right bundle branch block  . Bipolar 1 disorder ([place])   . Cancer ([place])   . Hyperlipidemia   . Hypertension   . Morbid obesity ([place])   . Osteoarthritis   . Schizo-affective schizophrenia ([place])   . Thyroid disease    hypothyroidism    Patient Active Problem List   Diagnosis Date Noted  . Osteoarthritis of left knee 11/09/2014  . Nocturia more than twice per night 11/09/2014  . Chest pain 05/09/2014  . Cough 11/02/2013  . Right bundle branch block 02/23/2013  . Irritable bowel syndrome with constipation and diarrhea 10/28/2012  . Breast cancer ([place]) 04/07/2012  . Dermatitis 12/24/2011  . Hypercholesterolemia 04/30/2011   . Hypothyroidism 04/30/2011  . Dementia 04/30/2011  . Benign hypertensive heart disease without heart failure 04/30/2011    Past Surgical History:  Procedure Laterality Date  . BREAST SURGERY    . CHOLECYSTECTOMY    . DILATION AND CURETTAGE OF UTERUS    . ESOPHAGEAL MANOMETRY N/A 01/29/2018   Procedure: ESOPHAGEAL MANOMETRY (EM);  Surgeon: Wonda Horner, MD;  Location: [facility]COPY;  Service: Endoscopy;  Laterality: N/A;  . HAND SURGERY     Right-trigger finger  . KNEE SURGERY     Left  . TONSILLECTOMY      OB History   None      Home Medications    Prior to Admission medications   Medication Sig Start Date End Date Taking? Authorizing Provider  albuterol (PROVENTIL HFA;VENTOLIN HFA) 108 (90 Base) MCG/ACT inhaler Inhale into the lungs every 6 (six) hours as needed for wheezing or shortness of breath.   Yes [provider]  Acetaminophen (TYLENOL EXTRA STRENGTH PO) Take by mouth 2 (two) times daily.    [provider]  ALPRAZolam Duanne Moron) 0.5 MG tablet Take 0.5-2 mg by mouth 4 (four) times daily as needed for anxiety. Patient may take 3-4 tablets by mouth as needed at bedtime for sleep and 1-3 tablets by mouth three times daily as needed for anxiety    [provider]  aspirin 81 MG tablet Take 81 mg by mouth daily.    [provider]  benzonatate (TESSALON) 100 MG capsule Take 1 capsule (100 mg total) by mouth every 8 (eight) hours. 05/23/18   Volanda Napoleon, PA-C  Calcium Carbonate (CALCIUM 600 PO) Take 1 tablet by mouth daily.    [provider]  cephALEXin (KEFLEX) 500 MG capsule Take 1 capsule (500 mg total) by mouth 4 (four) times daily. 05/03/18   Deno Etienne, DO  co-enzyme Q-10 30 MG capsule Take 30 mg by mouth daily.    [provider]  donepezil (ARICEPT) 10 MG tablet Take 10 mg by mouth every morning.     [provider]  fenofibrate (TRICOR) 145 MG tablet TAKE 1 TABLET EVERY TUESDAY,THURSDAY,AND SATURDAY  AT 6 PM 04/20/18   Skeet Latch, MD  lamoTRIgine (LAMICTAL) 150 MG tablet Take 150 mg by mouth 2 (two) times daily. 04/06/16   [provider]  memantine (NAMENDA) 10 MG tablet Take 10 mg by mouth 2 (two) times daily.      [provider]  metoprolol tartrate (LOPRESSOR) 25 MG tablet Take 1 tablet (25 mg total) by mouth 2 (two) times daily. 03/18/18   Almyra Deforest, PA  Multiple Vitamins-Minerals (MULTIVITAMIN ADULT PO) Take 1 tablet by mouth daily.    [provider]  OLANZapine (ZYPREXA) 15 MG tablet Take 30 mg by mouth at bedtime.      [provider]  phenazopyridine (PYRIDIUM) 200 MG tablet Take 1 tablet (200 mg total) by mouth 3 (three) times daily. 05/03/18   Deno Etienne, DO  QUEtiapine (SEROQUEL) 25 MG tablet Take 50 mg by mouth at bedtime.     [provider]  ramelteon (ROZEREM) 8 MG tablet Take 8 mg by mouth at bedtime.      [provider]  spironolactone (ALDACTONE) 25 MG tablet TAKE 1 TABLET BY MOUTH DAILY 03/29/18   Burtis Junes, NP  SYNTHROID 150 MCG tablet TAKE 1 TABLET (150 MCG TOTAL) BY MOUTH DAILY. 10/23/15   Darlin Coco, MD    Family History Family History  Problem Relation Age of Onset  . Hypertension Mother     Social History Social History   Tobacco Use  . Smoking status: Former Smoker    Last attempt to quit: 12/08/1978    Years since quitting: 39.5  . Smokeless tobacco: Never Used  Substance Use Topics  . Alcohol use: Yes    Comment: 1 time a year  . Drug use: No     Allergies   Lithium; Fluoxetine; Macrodantin; Mirabegron; Paroxetine hcl; Paxil [paroxetine]; Prozac [fluoxetine hcl]; Sertraline hcl; Solifenacin; Sulfa antibiotics; and Wellbutrin [bupropion hcl]   Review of Systems Review of Systems  Reason unable to perform ROS: See HPI as above.     Physical Exam Triage Vital Signs ED Triage Vitals  Enc Vitals Group     BP 06/26/18 1528 (!) 166/62     Pulse Rate 06/26/18 1528 93      Resp 06/26/18 1528 16     Temp 06/26/18 1528 98.1 F (36.7 C)     Temp Source 06/26/18 1528 Oral     SpO2 06/26/18 1528 93 %     Weight --      Height --      Head Circumference --      Peak Flow --      Pain Score 06/26/18 1531 0     Pain Loc --      Pain Edu? --      Excl. in GC? --    No  data found.  Updated Vital Signs BP (!) 166/62   Pulse 93   Temp 98.1 F (36.7 C) (Oral)   Resp 16 Comment: with prolonged exhalation  SpO2 91% Comment: fluctuating btwn 80-94% RA  Physical Exam  Constitutional: She is oriented to person, place, and time. She appears well-developed and well-nourished.  Non-toxic appearance. She does not appear ill. No distress.  Patient speaking in full sentences without difficulty.  HENT:  Head: Normocephalic and atraumatic.  Right Ear: Tympanic membrane, external ear and ear canal normal. Tympanic membrane is not erythematous and not bulging.  Left Ear: Tympanic membrane, external ear and ear canal normal. Tympanic membrane is not erythematous and not bulging.  Nose: Nose normal. Right sinus exhibits no maxillary sinus tenderness and no frontal sinus tenderness. Left sinus exhibits no maxillary sinus tenderness and no frontal sinus tenderness.  Mouth/Throat: Uvula is midline, oropharynx is clear and moist and mucous membranes are normal.  Eyes: Pupils are equal, round, and reactive to light. Conjunctivae are normal.  Neck: Normal range of motion. Neck supple.  Cardiovascular: Normal rate, regular rhythm and normal heart sounds. Exam reveals no gallop and no friction rub.  No murmur heard. Pulmonary/Chest: Effort normal. No accessory muscle usage. No respiratory distress.  Patient with decreased breath sounds to the right middle and lower field.  No obvious wheezing, rhonchi, rales.  Lymphadenopathy:    She has no cervical adenopathy.  Neurological: She is alert and oriented to person, place, and time.  Skin: Skin is warm and dry.  Psychiatric: She has  a normal mood and affect. Her behavior is normal. Judgment normal.   UC Treatments / Results  Labs (all labs ordered are listed, but only abnormal results are displayed) Labs Reviewed - No data to display  EKG None  Radiology Dg Chest 2 View  Result Date: 06/26/2018 CLINICAL DATA:  Severe viral infection.  Progressive short of breath EXAM: CHEST - 2 VIEW COMPARISON:  05/23/2018 FINDINGS: Normal cardiac silhouette. Large RIGHT pleural effusion is increased significantly from comparison exam. No airspace disease. LEFT lung clear. No acute osseous abnormality. IMPRESSION: New large unilateral RIGHT pleural effusion. Electronically Signed   By: Suzy Bouchard M.D.   On: 06/26/2018 16:27    Procedures Procedures (including critical care time)  Medications Ordered in UC Medications - No data to display  Initial Impression / Assessment and Plan / UC Course  I have reviewed the triage vital signs and the nursing notes.  Pertinent labs & imaging results that were available during my care of the patient were reviewed by me and considered in my medical decision making (see chart for details).    Discussed x-ray results with patient.  Right pleural effusion.  Patient discharged in stable condition to the emergency department for further evaluation and management needed.  Patient transported via wheelchair by staff.  Final Clinical Impressions(s) / UC Diagnoses   Final diagnoses:  Pleural effusion    ED Prescriptions    None        Ok Edwards, PA-C 06/26/18 1708

## 2018-06-27 NOTE — ED Provider Notes (Signed)
[facility] EMERGENCY DEPARTMENT Provider Note   CSN: [id number] Arrival date & time: 06/26/18  1706     History   Chief Complaint Chief Complaint  Patient presents with  . Shortness of Breath    HPI [name] is a 77 y.o. female.  HPI Patient presents with shortness of breath.  Has had worse breathing over the last few days.  Around a month ago had a "virus" that lasted for a few weeks.  Had been doing better.  Had an x-ray that was negative a month ago.  X-ray done today at urgent care showed new right-sided moderate pleural effusion.  Mild swelling in the legs.  No fevers.  And had some mild hypoxia prior to arrival.  Not on oxygen at home.  No real chest pain but states she does feel worse.  No cough.  No swelling in her legs.  Previous history of breast cancer but states she is cancer free as far she knows.  Patient always sleeps with 3 pillows. Past Medical History:  Diagnosis Date  . Anxiety   . Arrhythmia    right bundle branch block  . Bipolar 1 disorder ([place])   . Cancer ([place])   . Hyperlipidemia   . Hypertension   . Morbid obesity ([place])   . Osteoarthritis   . Schizo-affective schizophrenia ([place])   . Thyroid disease    hypothyroidism    Patient Active Problem List   Diagnosis Date Noted  . Osteoarthritis of left knee 11/09/2014  . Nocturia more than twice per night 11/09/2014  . Chest pain 05/09/2014  . Cough 11/02/2013  . Right bundle branch block 02/23/2013  . Irritable bowel syndrome with constipation and diarrhea 10/28/2012  . Breast cancer ([place]) 04/07/2012  . Dermatitis 12/24/2011  . Hypercholesterolemia 04/30/2011  . Hypothyroidism 04/30/2011  . Dementia 04/30/2011  . Benign hypertensive heart disease without heart failure 04/30/2011    Past Surgical History:  Procedure Laterality Date  . BREAST SURGERY    . CHOLECYSTECTOMY    . DILATION AND CURETTAGE OF UTERUS    . ESOPHAGEAL MANOMETRY N/A 01/29/2018   Procedure:  ESOPHAGEAL MANOMETRY (EM);  Surgeon: Wonda Horner, MD;  Location: [facility];  Service: Endoscopy;  Laterality: N/A;  . HAND SURGERY     Right-trigger finger  . KNEE SURGERY     Left  . TONSILLECTOMY       OB History   None      Home Medications    Prior to Admission medications   Medication Sig Start Date End Date Taking? Authorizing Provider  Acetaminophen (TYLENOL EXTRA STRENGTH PO) Take by mouth 2 (two) times daily.    [provider]  albuterol (PROVENTIL HFA;VENTOLIN HFA) 108 (90 Base) MCG/ACT inhaler Inhale into the lungs every 6 (six) hours as needed for wheezing or shortness of breath.    [provider]  ALPRAZolam Duanne Moron) 0.5 MG tablet Take 0.5-2 mg by mouth 4 (four) times daily as needed for anxiety. Patient may take 3-4 tablets by mouth as needed at bedtime for sleep and 1-3 tablets by mouth three times daily as needed for anxiety    [provider]  aspirin 81 MG tablet Take 81 mg by mouth daily.    [provider]  benzonatate (TESSALON) 100 MG capsule Take 1 capsule (100 mg total) by mouth every 8 (eight) hours. 05/23/18   Volanda Napoleon, PA-C  Calcium Carbonate (CALCIUM 600 PO) Take 1 tablet by mouth daily.  [provider]  cephALEXin (KEFLEX) 500 MG capsule Take 1 capsule (500 mg total) by mouth 4 (four) times daily. 05/03/18   Deno Etienne, DO  co-enzyme Q-10 30 MG capsule Take 30 mg by mouth daily.    [provider]  donepezil (ARICEPT) 10 MG tablet Take 10 mg by mouth every morning.     [provider]  fenofibrate (TRICOR) 145 MG tablet TAKE 1 TABLET EVERY TUESDAY,THURSDAY,AND SATURDAY AT 6 PM 04/20/18   Skeet Latch, MD  lamoTRIgine (LAMICTAL) 150 MG tablet Take 150 mg by mouth 2 (two) times daily. 04/06/16   [provider]  memantine (NAMENDA) 10 MG tablet Take 10 mg by mouth 2 (two) times daily.      [provider]  metoprolol tartrate (LOPRESSOR) 25 MG tablet Take 1  tablet (25 mg total) by mouth 2 (two) times daily. 03/18/18   Almyra Deforest, PA  Multiple Vitamins-Minerals (MULTIVITAMIN ADULT PO) Take 1 tablet by mouth daily.    [provider]  OLANZapine (ZYPREXA) 15 MG tablet Take 30 mg by mouth at bedtime.      [provider]  phenazopyridine (PYRIDIUM) 200 MG tablet Take 1 tablet (200 mg total) by mouth 3 (three) times daily. 05/03/18   Deno Etienne, DO  QUEtiapine (SEROQUEL) 25 MG tablet Take 50 mg by mouth at bedtime.     [provider]  ramelteon (ROZEREM) 8 MG tablet Take 8 mg by mouth at bedtime.      [provider]  spironolactone (ALDACTONE) 25 MG tablet TAKE 1 TABLET BY MOUTH DAILY 03/29/18   Burtis Junes, NP  SYNTHROID 150 MCG tablet TAKE 1 TABLET (150 MCG TOTAL) BY MOUTH DAILY. 10/23/15   Darlin Coco, MD    Family History Family History  Problem Relation Age of Onset  . Hypertension Mother     Social History Social History   Tobacco Use  . Smoking status: Former Smoker    Last attempt to quit: 12/08/1978    Years since quitting: 39.5  . Smokeless tobacco: Never Used  Substance Use Topics  . Alcohol use: Yes    Comment: 1 time a year  . Drug use: No     Allergies   Lithium; Fluoxetine; Macrodantin; Mirabegron; Paroxetine hcl; Paxil [paroxetine]; Prozac [fluoxetine hcl]; Sertraline hcl; Solifenacin; Sulfa antibiotics; and Wellbutrin [bupropion hcl]   Review of Systems Review of Systems  Constitutional: Positive for fatigue.  Respiratory: Positive for shortness of breath.   Gastrointestinal: Negative for abdominal distention and abdominal pain.  Genitourinary: Negative for flank pain.  Musculoskeletal: Negative for back pain.  Neurological: Positive for weakness.  Hematological: Negative for adenopathy.  Psychiatric/Behavioral: Negative for agitation and confusion.     Physical Exam Updated Vital Signs BP (!) 148/62   Pulse 92   Temp 98.6 F (37 C) (Oral)   Resp 20   Ht 5\' 2"   (1.575 m)   Wt 78.9 kg (174 lb)   SpO2 94%   BMI 31.83 kg/m   Physical Exam  Constitutional: She appears well-developed.  HENT:  Head: Normocephalic.  Eyes: EOM are normal.  Cardiovascular: Regular rhythm.  Pulmonary/Chest:  Decreased breath sounds right lower lung fields.  Abdominal: Soft.  Musculoskeletal:  Mild edema bilateral lower extremities.  Skin: Skin is warm. Capillary refill takes less than 2 seconds.     ED Treatments / Results  Labs (all labs ordered are listed, but only abnormal results are displayed) Labs Reviewed  BASIC METABOLIC PANEL - Abnormal;  Notable for the following components:      Result Value   Glucose, Bld 119 (*)    All other components within normal limits  HEPATIC FUNCTION PANEL - Abnormal; Notable for the following components:   Alkaline Phosphatase 33 (*)    All other components within normal limits  CBC  I-STAT TROPONIN, ED    EKG EKG Interpretation  Date/Time:  Saturday June 26 2018 17:11:43 EDT Ventricular Rate:  96 PR Interval:  154 QRS Duration: 116 QT Interval:  370 QTC Calculation: 467 R Axis:   -5 Text Interpretation:  Normal sinus rhythm Left ventricular hypertrophy with QRS widening ST & T wave abnormality, consider lateral ischemia Abnormal ECG Confirmed by Davonna Belling [phone]) on 06/26/2018 5:36:21 PM   Radiology Dg Chest 2 View  Result Date: 06/26/2018 CLINICAL DATA:  Severe viral infection.  Progressive short of breath EXAM: CHEST - 2 VIEW COMPARISON:  05/23/2018 FINDINGS: Normal cardiac silhouette. Large RIGHT pleural effusion is increased significantly from comparison exam. No airspace disease. LEFT lung clear. No acute osseous abnormality. IMPRESSION: New large unilateral RIGHT pleural effusion. Electronically Signed   By: Suzy Bouchard M.D.   On: 06/26/2018 16:27    Procedures Procedures (including critical care time)  Medications Ordered in ED Medications - No data to display   Initial Impression /  Assessment and Plan / ED Course  I have reviewed the triage vital signs and the nursing notes.  Pertinent labs & imaging results that were available during my care of the patient were reviewed by me and considered in my medical decision making (see chart for details).     Patient with shortness of breath and pleural effusion.  Right-sided.  Not hypoxic with ambulation.  Patient really does not want to be admitted.  Discussed with pulmonology but it was the doctor the normally does E link.  He states he will forward the information to the pulmonologist working in the morning in terms of follow-up.  Patient was given information for the [facility] pulmonology.  Discussed with interventional radiology and ordered thoracentesis.  Should be called on Monday in terms of getting the procedure done.  Labs ordered.  Discharge home to follow-up with PCP or the [facility] pulmonology.  Final Clinical Impressions(s) / ED Diagnoses   Final diagnoses:  Pleural effusion    ED Discharge Orders        Ordered    US THORACENTESIS ASP PLEURAL SPACE W/IMG GUIDE     06/26/18 1932       Davonna Belling, MD 06/27/18 [id number]

## 2018-06-28 ENCOUNTER — Inpatient Hospital Stay (HOSPITAL_COMMUNITY): Payer: Medicare HMO

## 2018-06-28 ENCOUNTER — Emergency Department (HOSPITAL_COMMUNITY): Payer: Medicare HMO

## 2018-06-28 ENCOUNTER — Other Ambulatory Visit: Payer: Self-pay

## 2018-06-28 ENCOUNTER — Observation Stay (HOSPITAL_COMMUNITY)
Admission: EM | Admit: 2018-06-28 | Discharge: 2018-06-29 | Disposition: A | Discharge status: Home / Self Care | Payer: Medicare HMO | Attending: Internal Medicine | Admitting: Internal Medicine

## 2018-06-28 ENCOUNTER — Encounter (HOSPITAL_COMMUNITY): Payer: Self-pay | Admitting: Emergency Medicine

## 2018-06-28 DIAGNOSIS — E039 Hypothyroidism, unspecified: Secondary | ICD-10-CM | POA: Diagnosis not present

## 2018-06-28 DIAGNOSIS — Z87891 Personal history of nicotine dependence: Secondary | ICD-10-CM | POA: Insufficient documentation

## 2018-06-28 DIAGNOSIS — F319 Bipolar disorder, unspecified: Secondary | ICD-10-CM | POA: Insufficient documentation

## 2018-06-28 DIAGNOSIS — K582 Mixed irritable bowel syndrome: Secondary | ICD-10-CM | POA: Diagnosis not present

## 2018-06-28 DIAGNOSIS — R0902 Hypoxemia: Secondary | ICD-10-CM | POA: Insufficient documentation

## 2018-06-28 DIAGNOSIS — Z79899 Other long term (current) drug therapy: Secondary | ICD-10-CM | POA: Insufficient documentation

## 2018-06-28 DIAGNOSIS — R0600 Dyspnea, unspecified: Secondary | ICD-10-CM

## 2018-06-28 DIAGNOSIS — E7800 Pure hypercholesterolemia, unspecified: Secondary | ICD-10-CM | POA: Insufficient documentation

## 2018-06-28 DIAGNOSIS — R351 Nocturia: Secondary | ICD-10-CM | POA: Insufficient documentation

## 2018-06-28 DIAGNOSIS — J90 Pleural effusion, not elsewhere classified: Secondary | ICD-10-CM | POA: Diagnosis not present

## 2018-06-28 DIAGNOSIS — I10 Essential (primary) hypertension: Secondary | ICD-10-CM | POA: Diagnosis present

## 2018-06-28 DIAGNOSIS — F0390 Unspecified dementia without behavioral disturbance: Secondary | ICD-10-CM | POA: Diagnosis not present

## 2018-06-28 DIAGNOSIS — J948 Other specified pleural conditions: Secondary | ICD-10-CM | POA: Diagnosis not present

## 2018-06-28 DIAGNOSIS — F0150 Vascular dementia without behavioral disturbance: Secondary | ICD-10-CM | POA: Diagnosis not present

## 2018-06-28 DIAGNOSIS — I4510 Unspecified right bundle-branch block: Secondary | ICD-10-CM | POA: Diagnosis not present

## 2018-06-28 DIAGNOSIS — Z7982 Long term (current) use of aspirin: Secondary | ICD-10-CM | POA: Insufficient documentation

## 2018-06-28 DIAGNOSIS — F209 Schizophrenia, unspecified: Secondary | ICD-10-CM | POA: Diagnosis not present

## 2018-06-28 DIAGNOSIS — R0602 Shortness of breath: Secondary | ICD-10-CM

## 2018-06-28 DIAGNOSIS — I119 Hypertensive heart disease without heart failure: Secondary | ICD-10-CM | POA: Diagnosis not present

## 2018-06-28 DIAGNOSIS — R05 Cough: Secondary | ICD-10-CM | POA: Insufficient documentation

## 2018-06-28 HISTORY — PX: IR THORACENTESIS ASP PLEURAL SPACE W/IMG GUIDE: IMG5380

## 2018-06-28 LAB — BASIC METABOLIC PANEL
Anion gap: 10 (ref 5–15)
BUN: 13 mg/dL (ref 8–23)
CHLORIDE: 107 mmol/L (ref 98–111)
CO2: 26 mmol/L (ref 22–32)
CREATININE: 0.9 mg/dL (ref 0.44–1.00)
Calcium: 10.2 mg/dL (ref 8.9–10.3)
GFR calc Af Amer: >60 mL/min (ref >60)
GFR calc non Af Amer: 60 mL/min — ABNORMAL LOW (ref >60)
GLUCOSE: 108 mg/dL — AB (ref 70–99)
POTASSIUM: 4.1 mmol/L (ref 3.5–5.1)
Sodium: 143 mmol/L (ref 135–145)

## 2018-06-28 LAB — MAGNESIUM: MAGNESIUM: 2 mg/dL (ref 1.7–2.4)

## 2018-06-28 LAB — CBC
HCT: 43.7 % (ref 36.0–46.0)
HEMOGLOBIN: 13.9 g/dL (ref 12.0–15.0)
MCH: 30.8 pg (ref 26.0–34.0)
MCHC: 31.8 g/dL (ref 30.0–36.0)
MCV: 96.9 fL (ref 78.0–100.0)
Platelets: 300 10*3/uL (ref 150–400)
RBC: 4.51 MIL/uL (ref 3.87–5.11)
RDW: 13.6 % (ref 11.5–15.5)
WBC: 5.2 10*3/uL (ref 4.0–10.5)

## 2018-06-28 LAB — ECHOCARDIOGRAM COMPLETE
HEIGHTINCHES: 62 in
WEIGHTICAEL: 2723.12 [oz_av]

## 2018-06-28 LAB — PROTEIN, PLEURAL OR PERITONEAL FLUID

## 2018-06-28 LAB — GLUCOSE, PLEURAL OR PERITONEAL FLUID: Glucose, Fluid: 93 mg/dL

## 2018-06-28 LAB — BODY FLUID CELL COUNT WITH DIFFERENTIAL
EOS FL: 0 %
LYMPHS FL: 84 %
MONOCYTE-MACROPHAGE-SEROUS FLUID: 15 % — AB (ref 50–90)
Neutrophil Count, Fluid: 1 % (ref 0–25)
Total Nucleated Cell Count, Fluid: 3125 cu mm — ABNORMAL HIGH (ref 0–1000)

## 2018-06-28 LAB — LACTATE DEHYDROGENASE, PLEURAL OR PERITONEAL FLUID

## 2018-06-28 LAB — D-DIMER, QUANTITATIVE: D-Dimer, Quant: 2.09 ug/mL-FEU — ABNORMAL HIGH (ref 0.00–0.50)

## 2018-06-28 LAB — I-STAT TROPONIN, ED: TROPONIN I, POC: 0.02 ng/mL (ref 0.00–0.08)

## 2018-06-28 MED ORDER — RAMELTEON 8 MG PO TABS
8.0000 mg | ORAL_TABLET | Freq: Every day | ORAL | Status: DC
  Administered 2018-06-28: 8 mg via ORAL
  Filled 2018-06-28: qty 1

## 2018-06-28 MED ORDER — LIDOCAINE HCL (PF) 2 % IJ SOLN
INTRAMUSCULAR | Status: AC
  Filled 2018-06-28: qty 20

## 2018-06-28 MED ORDER — LEVALBUTEROL HCL 0.63 MG/3ML IN NEBU: 0.6300 mg | INHALATION_SOLUTION | Freq: Four times a day (QID) | RESPIRATORY_TRACT | Status: DC | PRN

## 2018-06-28 MED ORDER — DONEPEZIL HCL 10 MG PO TABS
10.0000 mg | ORAL_TABLET | Freq: Every morning | ORAL | Status: DC
  Administered 2018-06-29: 10 mg via ORAL
  Filled 2018-06-28: qty 1

## 2018-06-28 MED ORDER — ONDANSETRON HCL 4 MG/2ML IJ SOLN: 4.0000 mg | Freq: Four times a day (QID) | INTRAMUSCULAR | Status: DC | PRN

## 2018-06-28 MED ORDER — CALCIUM CARBONATE 1250 (500 CA) MG PO TABS
1250.0000 mg | ORAL_TABLET | Freq: Every day | ORAL | Status: DC
  Administered 2018-06-29: 1250 mg via ORAL
  Filled 2018-06-28: qty 1

## 2018-06-28 MED ORDER — QUETIAPINE FUMARATE 25 MG PO TABS
50.0000 mg | ORAL_TABLET | Freq: Every day | ORAL | Status: DC
  Administered 2018-06-28: 50 mg via ORAL
  Filled 2018-06-28: qty 2

## 2018-06-28 MED ORDER — ENOXAPARIN SODIUM 40 MG/0.4ML ~~LOC~~ SOLN
40.0000 mg | SUBCUTANEOUS | Status: DC
  Administered 2018-06-28: 40 mg via SUBCUTANEOUS
  Filled 2018-06-28: qty 0.4

## 2018-06-28 MED ORDER — LAMOTRIGINE 25 MG PO TABS
150.0000 mg | ORAL_TABLET | Freq: Two times a day (BID) | ORAL | Status: DC
  Administered 2018-06-28 – 2018-06-29 (×2): 150 mg via ORAL
  Filled 2018-06-28 (×2): qty 6

## 2018-06-28 MED ORDER — LEVALBUTEROL HCL 1.25 MG/0.5ML IN NEBU
1.2500 mg | INHALATION_SOLUTION | Freq: Four times a day (QID) | RESPIRATORY_TRACT | Status: DC | PRN
  Filled 2018-06-28: qty 0.5

## 2018-06-28 MED ORDER — IOPAMIDOL (ISOVUE-370) INJECTION 76%
80.0000 mL | Freq: Once | INTRAVENOUS | Status: AC | PRN
  Administered 2018-06-28: 80 mL via INTRAVENOUS

## 2018-06-28 MED ORDER — ACETAMINOPHEN 650 MG RE SUPP: 650.0000 mg | Freq: Four times a day (QID) | RECTAL | Status: DC | PRN

## 2018-06-28 MED ORDER — OLANZAPINE 10 MG PO TABS
30.0000 mg | ORAL_TABLET | Freq: Every day | ORAL | Status: DC
  Administered 2018-06-28: 30 mg via ORAL
  Filled 2018-06-28: qty 3

## 2018-06-28 MED ORDER — IOPAMIDOL (ISOVUE-370) INJECTION 76%
INTRAVENOUS | Status: AC
  Filled 2018-06-28: qty 100

## 2018-06-28 MED ORDER — METOPROLOL TARTRATE 25 MG PO TABS
25.0000 mg | ORAL_TABLET | Freq: Two times a day (BID) | ORAL | Status: DC
  Administered 2018-06-28 – 2018-06-29 (×2): 25 mg via ORAL
  Filled 2018-06-28 (×2): qty 1

## 2018-06-28 MED ORDER — ASPIRIN EC 81 MG PO TBEC
81.0000 mg | DELAYED_RELEASE_TABLET | Freq: Every day | ORAL | Status: DC
  Administered 2018-06-29: 81 mg via ORAL
  Filled 2018-06-28: qty 1

## 2018-06-28 MED ORDER — COENZYME Q10 30 MG PO CAPS: 30.0000 mg | ORAL_CAPSULE | Freq: Every day | ORAL | Status: DC

## 2018-06-28 MED ORDER — IOPAMIDOL (ISOVUE-370) INJECTION 76%
INTRAVENOUS | Status: AC
  Filled 2018-06-28: qty 50

## 2018-06-28 MED ORDER — ACETAMINOPHEN 325 MG PO TABS
650.0000 mg | ORAL_TABLET | Freq: Four times a day (QID) | ORAL | Status: DC | PRN
  Administered 2018-06-28: 325 mg via ORAL
  Filled 2018-06-28: qty 2

## 2018-06-28 MED ORDER — ALPRAZOLAM 0.5 MG PO TABS
0.5000 mg | ORAL_TABLET | Freq: Four times a day (QID) | ORAL | Status: DC | PRN
  Administered 2018-06-28 – 2018-06-29 (×3): 1 mg via ORAL
  Filled 2018-06-28 (×3): qty 2

## 2018-06-28 MED ORDER — ONDANSETRON HCL 4 MG PO TABS: 4.0000 mg | ORAL_TABLET | Freq: Four times a day (QID) | ORAL | Status: DC | PRN

## 2018-06-28 MED ORDER — ALBUTEROL SULFATE (2.5 MG/3ML) 0.083% IN NEBU
3.0000 mL | INHALATION_SOLUTION | Freq: Four times a day (QID) | RESPIRATORY_TRACT | Status: DC | PRN
Start: 2018-06-28 — End: 2018-06-29

## 2018-06-28 NOTE — Progress Notes (Signed)
  Echocardiogram 2D Echocardiogram has been performed.  Johny Chess 06/28/2018, 3:42 PM

## 2018-06-28 NOTE — ED Notes (Signed)
Pt removed from bedpan. Denies c/o

## 2018-06-28 NOTE — ED Provider Notes (Signed)
[facility] EMERGENCY DEPARTMENT Provider Note   CSN: [id number] Arrival date & time: 06/28/18  4098     History   Chief Complaint Chief Complaint  Patient presents with  . Shortness of Breath    HPI [name] is a 77 y.o. female.  Patient is a 77 year old female with a history of morbid obesity, hypertension, hyperlipidemia, mental health disease who presented 2 days ago with shortness of breath and was found to have a large right pleural effusion at urgent care.  Patient states approximately 1 month ago she had URI type symptoms but had a normal chest x-ray a month ago.  She had been complaining of some shortness of breath and that is why an x-ray was obtained at urgent care.  At the time of her emergency room visit 2 days ago her labs were relatively normal chest x-ray showed a large right pleural effusion without prior history of similar.  Patient was feeling well and wanting to go home.  Initially follow-up was made to have thoracentesis done as an outpatient.  However when she woke up this morning her shortness of breath was significantly worse and she felt like she needed some oxygen and was not going to be able to wait to set up an outpatient thoracentesis.  The history is provided by the patient.  Shortness of Breath  This is a new problem. The average episode lasts 2 weeks. The problem occurs continuously.Episode onset: worse over the last 3 days. The problem has been rapidly worsening. Associated symptoms include cough. Pertinent negatives include no fever, no sputum production, no wheezing, no vomiting, no abdominal pain and no leg swelling. Treatments tried: sitting upright. The treatment provided no relief. She has had prior ED visits.    Past Medical History:  Diagnosis Date  . Anxiety   . Arrhythmia    right bundle branch block  . Bipolar 1 disorder ([place])   . Cancer ([place])   . Hyperlipidemia   . Hypertension   . Morbid obesity ([place])   .  Osteoarthritis   . Schizo-affective schizophrenia ([place])   . Thyroid disease    hypothyroidism    Patient Active Problem List   Diagnosis Date Noted  . Osteoarthritis of left knee 11/09/2014  . Nocturia more than twice per night 11/09/2014  . Chest pain 05/09/2014  . Cough 11/02/2013  . Right bundle branch block 02/23/2013  . Irritable bowel syndrome with constipation and diarrhea 10/28/2012  . Breast cancer ([place]) 04/07/2012  . Dermatitis 12/24/2011  . Hypercholesterolemia 04/30/2011  . Hypothyroidism 04/30/2011  . Dementia 04/30/2011  . Benign hypertensive heart disease without heart failure 04/30/2011    Past Surgical History:  Procedure Laterality Date  . BREAST SURGERY    . CHOLECYSTECTOMY    . DILATION AND CURETTAGE OF UTERUS    . ESOPHAGEAL MANOMETRY N/A 01/29/2018   Procedure: ESOPHAGEAL MANOMETRY (EM);  Surgeon: Wonda Horner, MD;  Location: [facility] ENDOSCOPY;  Service: Endoscopy;  Laterality: N/A;  . HAND SURGERY     Right-trigger finger  . KNEE SURGERY     Left  . TONSILLECTOMY       OB History   None      Home Medications    Prior to Admission medications   Medication Sig Start Date End Date Taking? Authorizing Provider  Acetaminophen (TYLENOL EXTRA STRENGTH PO) Take by mouth 2 (two) times daily.    [provider]  albuterol (PROVENTIL HFA;VENTOLIN HFA) 108 (90 Base) MCG/ACT inhaler  Inhale into the lungs every 6 (six) hours as needed for wheezing or shortness of breath.    [provider]  ALPRAZolam Duanne Moron) 0.5 MG tablet Take 0.5-2 mg by mouth 4 (four) times daily as needed for anxiety. Patient may take 3-4 tablets by mouth as needed at bedtime for sleep and 1-3 tablets by mouth three times daily as needed for anxiety    [provider]  aspirin 81 MG tablet Take 81 mg by mouth daily.    [provider]  benzonatate (TESSALON) 100 MG capsule Take 1 capsule (100 mg total) by mouth every 8 (eight) hours. 05/23/18   Volanda Napoleon, PA-C  Calcium Carbonate (CALCIUM 600 PO) Take 1 tablet by mouth daily.    [provider]  cephALEXin (KEFLEX) 500 MG capsule Take 1 capsule (500 mg total) by mouth 4 (four) times daily. 05/03/18   Deno Etienne, DO  co-enzyme Q-10 30 MG capsule Take 30 mg by mouth daily.    [provider]  donepezil (ARICEPT) 10 MG tablet Take 10 mg by mouth every morning.     [provider]  fenofibrate (TRICOR) 145 MG tablet TAKE 1 TABLET EVERY TUESDAY,THURSDAY,AND SATURDAY AT 6 PM 04/20/18   Skeet Latch, MD  lamoTRIgine (LAMICTAL) 150 MG tablet Take 150 mg by mouth 2 (two) times daily. 04/06/16   [provider]  memantine (NAMENDA) 10 MG tablet Take 10 mg by mouth 2 (two) times daily.      [provider]  metoprolol tartrate (LOPRESSOR) 25 MG tablet Take 1 tablet (25 mg total) by mouth 2 (two) times daily. 03/18/18   Almyra Deforest, PA  Multiple Vitamins-Minerals (MULTIVITAMIN ADULT PO) Take 1 tablet by mouth daily.    [provider]  OLANZapine (ZYPREXA) 15 MG tablet Take 30 mg by mouth at bedtime.      [provider]  phenazopyridine (PYRIDIUM) 200 MG tablet Take 1 tablet (200 mg total) by mouth 3 (three) times daily. 05/03/18   Deno Etienne, DO  QUEtiapine (SEROQUEL) 25 MG tablet Take 50 mg by mouth at bedtime.     [provider]  ramelteon (ROZEREM) 8 MG tablet Take 8 mg by mouth at bedtime.      [provider]  spironolactone (ALDACTONE) 25 MG tablet TAKE 1 TABLET BY MOUTH DAILY 03/29/18   Burtis Junes, NP  SYNTHROID 150 MCG tablet TAKE 1 TABLET (150 MCG TOTAL) BY MOUTH DAILY. 10/23/15   Darlin Coco, MD    Family History Family History  Problem Relation Age of Onset  . Hypertension Mother     Social History Social History   Tobacco Use  . Smoking status: Former Smoker    Last attempt to quit: 12/08/1978    Years since quitting: 39.5  . Smokeless tobacco: Never Used  Substance Use Topics  .  Alcohol use: Yes    Comment: 1 time a year  . Drug use: No     Allergies   Lithium; Fluoxetine; Macrodantin; Mirabegron; Paroxetine hcl; Paxil [paroxetine]; Prozac [fluoxetine hcl]; Sertraline hcl; Solifenacin; Sulfa antibiotics; and Wellbutrin [bupropion hcl]   Review of Systems Review of Systems  Constitutional: Negative for fever.  Respiratory: Positive for cough and shortness of breath. Negative for sputum production and wheezing.   Cardiovascular: Negative for leg swelling.  Gastrointestinal: Negative for abdominal pain and vomiting.  All other systems reviewed and are negative.    Physical Exam Updated Vital Signs BP 123/78 (BP Location: Right Arm)  Pulse 74   Temp 98.3 F (36.8 C) (Oral)   Resp 16   Ht 5\' 2"  (1.575 m)   Wt 78.9 kg (174 lb)   SpO2 90%   BMI 31.83 kg/m   Physical Exam  Constitutional: She is oriented to person, place, and time. She appears well-developed and well-nourished. No distress.  HENT:  Head: Normocephalic and atraumatic.  Eyes: Pupils are equal, round, and reactive to light. EOM are normal.  Cardiovascular: Normal rate, regular rhythm, normal heart sounds and intact distal pulses. Exam reveals no friction rub.  No murmur heard. Pulmonary/Chest: Effort normal. No tachypnea. She has decreased breath sounds in the right middle field and the right lower field. She has no wheezes. She has no rales.  Abdominal: Soft. Bowel sounds are normal. She exhibits no distension. There is no tenderness. There is no rebound and no guarding.  Musculoskeletal: Normal range of motion. She exhibits no tenderness.       Right lower leg: Normal.       Left lower leg: Normal.  No edema  Neurological: She is alert and oriented to person, place, and time. No cranial nerve deficit.  Skin: Skin is warm and dry. No rash noted.  Psychiatric: She has a normal mood and affect. Her behavior is normal.  Nursing note and vitals reviewed.    ED Treatments / Results    Labs (all labs ordered are listed, but only abnormal results are displayed) Labs Reviewed  BASIC METABOLIC PANEL - Abnormal; Notable for the following components:      Result Value   Glucose, Bld 108 (*)    GFR calc non Af Amer 60 (*)    All other components within normal limits  CBC  D-DIMER, QUANTITATIVE (NOT AT [facility])  I-STAT TROPONIN, ED    EKG EKG Interpretation  Date/Time:  Monday June 28 2018 07:39:50 EDT Ventricular Rate:  72 PR Interval:  156 QRS Duration: 122 QT Interval:  432 QTC Calculation: 473 R Axis:   0 Text Interpretation:  Normal sinus rhythm Right bundle branch block Left ventricular hypertrophy with repolarization abnormality No significant change since last tracing Confirmed by Blanchie Dessert [phone]) on 06/28/2018 8:03:44 AM   Radiology Dg Chest 2 View  Result Date: 06/26/2018 CLINICAL DATA:  Severe viral infection.  Progressive short of breath EXAM: CHEST - 2 VIEW COMPARISON:  05/23/2018 FINDINGS: Normal cardiac silhouette. Large RIGHT pleural effusion is increased significantly from comparison exam. No airspace disease. LEFT lung clear. No acute osseous abnormality. IMPRESSION: New large unilateral RIGHT pleural effusion. Electronically Signed   By: Suzy Bouchard M.D.   On: 06/26/2018 16:27   Dg Chest Portable 1 View  Result Date: 06/28/2018 CLINICAL DATA:  Short of breath EXAM: PORTABLE CHEST 1 VIEW COMPARISON:  06/26/2018 FINDINGS: Right pleural effusion and right lower lobe airspace disease unchanged. Left lung remains clear. Cardiac enlargement without heart failure. IMPRESSION: Right pleural effusion and right lower lobe airspace disease unchanged. No new findings Electronically Signed   By: Franchot Gallo M.D.   On: 06/28/2018 08:12    Procedures Procedures (including critical care time)  Medications Ordered in ED Medications - No data to display   Initial Impression / Assessment and Plan / ED Course  I have reviewed the triage vital  signs and the nursing notes.  Pertinent labs & imaging results that were available during my care of the patient were reviewed by me and considered in my medical decision making (see chart for details).  Patient returning to the emergency department today after being seen 2 days ago and diagnosed with a large right-sided pleural effusion.  At that time patient was seen here labs are relatively normal.  She did not really want to stay and she was able to ambulate with no significant hypoxia.  Arrangements were made for patient to follow-up with interventional radiology as an outpatient and pulmonology for thoracentesis and follow-up.  All this seemed to occur after having a URI about 1 month ago.  However patient returns today because the shortness of breath is worsening.  She is requiring oxygen to maintain her sats in the 90s.  Breath sounds are diminished on the right.  Will discuss with pulmonology for thoracentesis.  9:26 AM Labs remained stable.  Imaging shows persistent right pleural effusion.  Spoke with pulmonology who will see patient for thoracentesis and she was admitted for observation of the hospitalist service.  Final Clinical Impressions(s) / ED Diagnoses   Final diagnoses:  Pleural effusion on right  Hypoxia    ED Discharge Orders    None       Blanchie Dessert, MD 06/28/18 [phone]

## 2018-06-28 NOTE — ED Notes (Signed)
Pt to IR with telemetry.

## 2018-06-28 NOTE — H&P (Addendum)
[facility] History and Physical  [name] UVO:[id number] DOB: [birth year] DOA: 06/28/2018  Referring physician:  PCP: Amy Manes, MD   Chief Complaint shortness of breath  HPI:   77 year old female with a history of morbid obesity, bipolar 1 disorder, dyslipidemia, hypertension, hypothyroidism who presents to the ED with shortness of breath. Patient states that she had "heller's myotomy"in May. She had a rough time recovering from the surgery. She had significant pain for 1-1/2 months. She has not been very active during the last couple of months. About a month ago she developed a URI, which caused her to have excessive shortness of breath with dyspnea on exertion. Patient was prescribed Tessalon Perles but no antibiotic. She had persistent cough for about 3-1/2 weeks. She presented to the ED on 7/20 after an urgent care visit that showed a new right-sided moderate pleural effusion. She's had some orthopnea, dependent edema. She was discharged home on 7/20 with recommendations for an outpatient ultrasound guided thoracentesis and outpatient pulmonary follow-up.chest x-ray on 7/20 showed new large unilateral right-sided pleural effusion  Patient presented today with worsening shortness of breath. Unable to read ED course BP 123/78 (BP Location: Right Arm)   Pulse 74   Temp 98.3 F (36.8 C) (Oral)   Resp 16   Ht 5\' 2"  (1.575 m)   Wt 78.9 kg (174 lb)   SpO2 90%   BMI 31.83 kg/m  EKG showed normal sinus rhythm right bundle branch block, left ventricular hypertrophy with repolarization abnormality Chest x-ray unchanged Patient admitted for further evaluation of her shortness of breath     Review of Systems: negative for the following  Constitutional: Denies fever, chills, diaphoresis, appetite change and fatigue.  HEENT: Denies photophobia, eye pain, redness, hearing loss, ear pain, congestion, sore throat, rhinorrhea, sneezing, mouth sores, trouble swallowing, neck pain,  neck stiffness and tinnitus.  Respiratory: Positive for cough and shortness of breath.  Cardiovascular: Denies chest pain, palpitations and leg swelling.  Gastrointestinal: Denies nausea, vomiting, abdominal pain, diarrhea, constipation, blood in stool and abdominal distention.  Genitourinary: Denies dysuria, urgency, frequency, hematuria, flank pain and difficulty urinating.  Musculoskeletal: Denies myalgias, back pain, joint swelling, arthralgias and gait problem.  Skin: Denies pallor, rash and wound.  Neurological: Denies dizziness, seizures, syncope, weakness, light-headedness, numbness and headaches.  Hematological: Denies adenopathy. Easy bruising, personal or family bleeding history  Psychiatric/Behavioral: Denies suicidal ideation, mood changes, confusion, nervousness, sleep disturbance and agitation       Past Medical History:  Diagnosis Date  . Anxiety   . Arrhythmia    right bundle branch block  . Bipolar 1 disorder ([place])   . Cancer ([place])   . Hyperlipidemia   . Hypertension   . Morbid obesity ([place])   . Osteoarthritis   . Schizo-affective schizophrenia ([place])   . Thyroid disease    hypothyroidism     Past Surgical History:  Procedure Laterality Date  . BREAST SURGERY    . CHOLECYSTECTOMY    . DILATION AND CURETTAGE OF UTERUS    . ESOPHAGEAL MANOMETRY N/A 01/29/2018   Procedure: ESOPHAGEAL MANOMETRY (EM);  Surgeon: Wonda Horner, MD;  Location: [facility];  Service: Endoscopy;  Laterality: N/A;  . HAND SURGERY     Right-trigger finger  . KNEE SURGERY     Left  . TONSILLECTOMY        Social History:  reports that she quit smoking about 39 years ago. She has never used smokeless tobacco. She reports that she drinks alcohol.  She reports that she does not use drugs.    Allergies  Allergen Reactions  . Lithium Nausea Only  . Fluoxetine Other (See Comments)    Pt felt crazy  . Macrodantin Other (See Comments)    unknown  . Mirabegron Other (See Comments)     ineffective  . Paroxetine Hcl     Other reaction(s): Other (See Comments) Made pt feel crazy  . Paxil [Paroxetine] Other (See Comments)    Made pt feel crazy   . Prozac [Fluoxetine Hcl] Other (See Comments)    Pt felt crazy   . Sertraline Hcl   . Solifenacin Other (See Comments)    Ineffective   . Sulfa Antibiotics Other (See Comments)    unknown  . Wellbutrin [Bupropion Hcl] Other (See Comments)    unknown    Family History  Problem Relation Age of Onset  . Hypertension Mother         Prior to Admission medications   Medication Sig Start Date End Date Taking? Authorizing Provider  Acetaminophen (TYLENOL EXTRA STRENGTH PO) Take by mouth 2 (two) times daily.   Yes [provider]  albuterol (PROVENTIL HFA;VENTOLIN HFA) 108 (90 Base) MCG/ACT inhaler Inhale 2 puffs into the lungs every 6 (six) hours as needed for wheezing or shortness of breath.    Yes [provider]  ALPRAZolam Duanne Moron) 0.5 MG tablet Take 0.5-2 mg by mouth 4 (four) times daily as needed for anxiety. Patient may take 3-4 tablets by mouth as needed at bedtime for sleep and 1-3 tablets by mouth three times daily as needed for anxiety   Yes [provider]  aspirin 81 MG tablet Take 81 mg by mouth daily.   Yes [provider]  Calcium Carbonate (CALCIUM 600 PO) Take 1 tablet by mouth daily.   Yes [provider]  co-enzyme Q-10 30 MG capsule Take 30 mg by mouth daily.   Yes [provider]  donepezil (ARICEPT) 10 MG tablet Take 10 mg by mouth every morning.    Yes [provider]  fenofibrate (TRICOR) 145 MG tablet TAKE 1 TABLET EVERY TUESDAY,THURSDAY,AND SATURDAY AT 6 PM 04/20/18  Yes Skeet Latch, MD  HYDROcodone-acetaminophen (NORCO/VICODIN) 5-325 MG tablet Take 1 tablet by mouth daily as needed for pain.   Yes [provider]  Lactobacillus (PROBIOTIC ACIDOPHILUS PO) Take 1 capsule by mouth daily.   Yes [provider]   lamoTRIgine (LAMICTAL) 150 MG tablet Take 150 mg by mouth 2 (two) times daily. 04/06/16  Yes [provider]  memantine (NAMENDA) 10 MG tablet Take 10 mg by mouth 2 (two) times daily.     Yes [provider]  metoprolol tartrate (LOPRESSOR) 25 MG tablet Take 1 tablet (25 mg total) by mouth 2 (two) times daily. 03/18/18  Yes Almyra Deforest, PA  Multiple Vitamins-Minerals (MULTIVITAMIN ADULT PO) Take 1 tablet by mouth daily.   Yes [provider]  OLANZapine (ZYPREXA) 15 MG tablet Take 30 mg by mouth at bedtime.     Yes [provider]  QUEtiapine (SEROQUEL) 25 MG tablet Take 50 mg by mouth at bedtime.    Yes [provider]  ramelteon (ROZEREM) 8 MG tablet Take 8 mg by mouth at bedtime.     Yes [provider]  spironolactone (ALDACTONE) 25 MG tablet TAKE 1 TABLET BY MOUTH DAILY 03/29/18  Yes Gerhardt, Marlane Hatcher, NP  SYNTHROID 150 MCG tablet TAKE 1 TABLET (150 MCG TOTAL) BY MOUTH DAILY. 10/23/15  Yes  Darlin Coco, MD  trolamine salicylate (ASPERCREME) 10 % cream Apply 1 application topically as needed for muscle pain.   Yes [provider]  benzonatate (TESSALON) 100 MG capsule Take 1 capsule (100 mg total) by mouth every 8 (eight) hours. Patient not taking: Reported on 06/28/2018 05/23/18   Providence Lanius A, PA-C  cephALEXin (KEFLEX) 500 MG capsule Take 1 capsule (500 mg total) by mouth 4 (four) times daily. Patient not taking: Reported on 06/28/2018 05/03/18   Deno Etienne, DO  phenazopyridine (PYRIDIUM) 200 MG tablet Take 1 tablet (200 mg total) by mouth 3 (three) times daily. Patient not taking: Reported on 06/28/2018 05/03/18   Deno Etienne, DO     Physical Exam: Vitals:   06/28/18 0743 06/28/18 0753 06/28/18 0800 06/28/18 0815  BP:  123/78 139/64 (!) 148/68  Pulse:  74 71 71  Resp:  16 20 19   Temp:  98.3 F (36.8 C)    TempSrc:  Oral    SpO2:  90% 95% 93%  Weight: 78.9 kg (174 lb)     Height: 5\' 2"  (1.575 m)           Vitals:    06/28/18 0743 06/28/18 0753 06/28/18 0800 06/28/18 0815  BP:  123/78 139/64 (!) 148/68  Pulse:  74 71 71  Resp:  16 20 19   Temp:  98.3 F (36.8 C)    TempSrc:  Oral    SpO2:  90% 95% 93%  Weight: 78.9 kg (174 lb)     Height: 5\' 2"  (1.575 m)      Constitutional: NAD, calm, comfortable Eyes: PERRL, lids and conjunctivae normal ENMT: Mucous membranes are moist. Posterior pharynx clear of any exudate or lesions.Normal dentition.  Neck: normal, supple, no masses, no thyromegaly Pulmonary/Chest: Effort normal. No tachypnea. She has decreased breath sounds in the right middle field and the right lower field Cardiovascular: Regular rate and rhythm, no murmurs / rubs / gallops. No extremity edema. 2+ pedal pulses. No carotid bruits.  Abdomen: no tenderness, no masses palpated. No hepatosplenomegaly. Bowel sounds positive.  Musculoskeletal: no clubbing / cyanosis. No joint deformity upper and lower extremities. Good ROM, no contractures. Normal muscle tone.  Skin: no rashes, lesions, ulcers. No induration Neurologic: CN 2-12 grossly intact. Sensation intact, DTR normal. Strength 5/5 in all 4.  Psychiatric: Normal judgment and insight. Alert and oriented x 3. Normal mood.     Labs on Admission: I have personally reviewed following labs and imaging studies  CBC: Recent Labs  Lab 06/26/18 1718 06/28/18 0747  WBC 5.5 5.2  HGB 14.3 13.9  HCT 44.7 43.7  MCV 95.9 96.9  PLT 296 706    Basic Metabolic Panel: Recent Labs  Lab 06/26/18 1718 06/28/18 0747  NA 143 143  K 4.2 4.1  CL 105 107  CO2 24 26  GLUCOSE 119* 108*  BUN 9 13  CREATININE 0.87 0.90  CALCIUM 10.2 10.2    GFR: Estimated Creatinine Clearance: 50.1 mL/min (by C-G formula based on SCr of 0.9 mg/dL).  Liver Function Tests: Recent Labs  Lab 06/26/18 1718  AST 24  ALT 19  ALKPHOS 33*  BILITOT 0.5  PROT 7.6  ALBUMIN 4.4   No results for input(s): LIPASE, AMYLASE in the last 168 hours. No results for  input(s): AMMONIA in the last 168 hours.  Coagulation Profile: No results for input(s): INR, PROTIME in the last 168 hours. No results for input(s): DDIMER in the last 72 hours.  Cardiac Enzymes: No results  for input(s): CKTOTAL, CKMB, CKMBINDEX, TROPONINI in the last 168 hours.  BNP (last 3 results) No results for input(s): PROBNP in the last 8760 hours.  HbA1C: No results for input(s): HGBA1C in the last 72 hours. No results found for: HGBA1C   CBG: No results for input(s): GLUCAP in the last 168 hours.  Lipid Profile: No results for input(s): CHOL, HDL, LDLCALC, TRIG, CHOLHDL, LDLDIRECT in the last 72 hours.  Thyroid Function Tests: No results for input(s): TSH, T4TOTAL, FREET4, T3FREE, THYROIDAB in the last 72 hours.  Anemia Panel: No results for input(s): VITAMINB12, FOLATE, FERRITIN, TIBC, IRON, RETICCTPCT in the last 72 hours.  Urine analysis:    Component Value Date/Time   COLORURINE YELLOW 05/03/2018 0826   APPEARANCEUR HAZY (A) 05/03/2018 0826   LABSPEC 1.015 05/03/2018 0826   PHURINE 7.0 05/03/2018 0826   GLUCOSEU NEGATIVE 05/03/2018 0826   HGBUR LARGE (A) 05/03/2018 0826   BILIRUBINUR NEGATIVE 05/03/2018 0826   KETONESUR NEGATIVE 05/03/2018 0826   PROTEINUR 30 (A) 05/03/2018 0826   NITRITE NEGATIVE 05/03/2018 0826   LEUKOCYTESUR LARGE (A) 05/03/2018 0826    Sepsis Labs: @LABRCNTIP (procalcitonin:4,lacticidven:4) )No results found for this or any previous visit (from the past 240 hour(s)).       Radiological Exams on Admission: Dg Chest 2 View  Result Date: 06/26/2018 CLINICAL DATA:  Severe viral infection.  Progressive short of breath EXAM: CHEST - 2 VIEW COMPARISON:  05/23/2018 FINDINGS: Normal cardiac silhouette. Large RIGHT pleural effusion is increased significantly from comparison exam. No airspace disease. LEFT lung clear. No acute osseous abnormality. IMPRESSION: New large unilateral RIGHT pleural effusion. Electronically Signed   By: Suzy Bouchard M.D.   On: 06/26/2018 16:27   Dg Chest Portable 1 View  Result Date: 06/28/2018 CLINICAL DATA:  Short of breath EXAM: PORTABLE CHEST 1 VIEW COMPARISON:  06/26/2018 FINDINGS: Right pleural effusion and right lower lobe airspace disease unchanged. Left lung remains clear. Cardiac enlargement without heart failure. IMPRESSION: Right pleural effusion and right lower lobe airspace disease unchanged. No new findings Electronically Signed   By: Franchot Gallo M.D.   On: 06/28/2018 08:12   Dg Chest 2 View  Result Date: 06/26/2018 CLINICAL DATA:  Severe viral infection.  Progressive short of breath EXAM: CHEST - 2 VIEW COMPARISON:  05/23/2018 FINDINGS: Normal cardiac silhouette. Large RIGHT pleural effusion is increased significantly from comparison exam. No airspace disease. LEFT lung clear. No acute osseous abnormality. IMPRESSION: New large unilateral RIGHT pleural effusion. Electronically Signed   By: Suzy Bouchard M.D.   On: 06/26/2018 16:27   Dg Chest Portable 1 View  Result Date: 06/28/2018 CLINICAL DATA:  Short of breath EXAM: PORTABLE CHEST 1 VIEW COMPARISON:  06/26/2018 FINDINGS: Right pleural effusion and right lower lobe airspace disease unchanged. Left lung remains clear. Cardiac enlargement without heart failure. IMPRESSION: Right pleural effusion and right lower lobe airspace disease unchanged. No new findings Electronically Signed   By: Franchot Gallo M.D.   On: 06/28/2018 08:12      EKG: Independently reviewed. Normal sinus rhythm Right bundle branch block Left ventricular hypertrophy with repolarization abnormality No significant change    Assessment/Plan Principal Problem:   SOB (shortness of breath) on exertion Likely secondary to large right-sided pleural effusion Patient will be admitted for observation Ultrasound-guided thoracentesis has been ordered Suspect parapneumonic effusion That also be related to her recent thoracic surgery in May EDP has consulted  pulmonary Given shortness of breath will order 2-D echo to r/o CHF  Given  recent surgery check d-dimer Eventually will need further evaluation of her lung pathology with high-resolution CT to r/o ILD given her oxygen requirements or CT PE protocol if the d-dimer is positive     Hypercholesterolemia ContinueTriCor    Hypothyroidism Continue Synthroid, check TSH    Dementia Patient is on multiple psychotropic medications, continue Aricept    Benign hypertensive heart disease without heart failure Continue Aldactone,  Schizophrenia Continue Seroquel,rozerem    DVT prophylaxis:       consults called:  Family Communication: Admission, patients condition and plan of care including tests being ordered have been discussed with the patient  who indicates understanding and agree with the plan and Code Status   Admission status:  Inpatient    Disposition plan: Further plan will depend as patient's clinical course evolves and further radiologic and laboratory data become available. Likely home when stable    At the time of admission, it appears that the appropriate admission status for this patient is INPATIENT . This is judged to be reasonable and necessary in order to provide the required intensity of service to ensure the patient's safety given the presenting symptoms, physical exam findings, and initial radiographic and laboratory data in the context of their chronic comorbidities.   Reyne Dumas MD [facility] Pager [phone]  If 7PM-7AM, please contact night-coverage [email or web address] Password [facility]  06/28/2018, 9:42 AM

## 2018-06-28 NOTE — ED Triage Notes (Signed)
Pt. Stated, Ive had SOB since I had a virus for a week, it was a URI. 3 days after the virus Ive been SOB and its just worse.

## 2018-06-28 NOTE — Consult Note (Signed)
PULMONARY / CRITICAL CARE MEDICINE   Name: [name] MRN: [id number] DOB: Apr 11, 1941    ADMISSION DATE:  06/28/2018 CONSULTATION DATE: 06/28/2018  REFERRING MD:  Dr. Allyson Sabal, [facility]  CHIEF COMPLAINT:  Short of breath  HISTORY OF PRESENT ILLNESS:   77 yo female former smoker presented to ER with progressive dyspnea.  She had a respiratory infection about 1 month prior to admission and her breathing has gotten worse since then.  She also has a cough and the beginning associated with clear to yellow sputum.  She had CXR on 7/20 that showed a pleural effusion on the Rt.  She was set up for thoracentesis with IR as an outpt.  Her symptoms got worse, and she returned to the ER.  She was noted to have hypoxia in the ER.  She had thoracentesis by IR on 7/22 with 1.5 liters of milky, blood tinged fluid removed.  Fluid analysis shows glucose 93, WBC 3125 with 84% lymphocytes.  Apparently there was an interfering substance so LDH and protein could not be measured in pleural fluid.  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  Her breathing is better after thoracentesis.  No history of pneumonia or tuberculosis.  Quit smoking years ago.  Never was told she had an effusion before.  Was having trouble with swallowing, but much improved since myotomy.  Denies fever, hemoptysis, skin rash, joint swelling, leg swelling.  PAST MEDICAL HISTORY :  She  has a past medical history of Anxiety, Arrhythmia, Bipolar 1 disorder ([place]), Cancer ([place]), Hyperlipidemia, Hypertension, Morbid obesity ([place]), Osteoarthritis, Schizo-affective schizophrenia ([place]), and Thyroid disease.  PAST SURGICAL HISTORY: She  has a past surgical history that includes Knee surgery; Breast surgery; Hand surgery; Cholecystectomy; Tonsillectomy; Dilation and curettage of uterus; Esophageal manometry (N/A, 01/29/2018); and IR THORACENTESIS ASP PLEURAL SPACE W/IMG GUIDE (06/28/2018).  Allergies  Allergen Reactions  . Lithium  Nausea Only  . Fluoxetine Other (See Comments)    Pt felt crazy  . Macrodantin Other (See Comments)    unknown  . Mirabegron Other (See Comments)    ineffective  . Paroxetine Hcl     Other reaction(s): Other (See Comments) Made pt feel crazy  . Paxil [Paroxetine] Other (See Comments)    Made pt feel crazy   . Prozac [Fluoxetine Hcl] Other (See Comments)    Pt felt crazy   . Sertraline Hcl   . Solifenacin Other (See Comments)    Ineffective   . Sulfa Antibiotics Other (See Comments)    unknown  . Wellbutrin [Bupropion Hcl] Other (See Comments)    unknown    No current facility-administered medications on file prior to encounter.    Current Outpatient Medications on File Prior to Encounter  Medication Sig  . Acetaminophen (TYLENOL EXTRA STRENGTH PO) Take by mouth 2 (two) times daily.  Marland Kitchen albuterol (PROVENTIL HFA;VENTOLIN HFA) 108 (90 Base) MCG/ACT inhaler Inhale 2 puffs into the lungs every 6 (six) hours as needed for wheezing or shortness of breath.   . ALPRAZolam (XANAX) 0.5 MG tablet Take 0.5-2 mg by mouth 4 (four) times daily as needed for anxiety. Patient may take 3-4 tablets by mouth as needed at bedtime for sleep and 1-3 tablets by mouth three times daily as needed for anxiety  . aspirin 81 MG tablet Take 81 mg by mouth daily.  . Calcium Carbonate (CALCIUM 600 PO) Take 1 tablet by mouth daily.  Marland Kitchen co-enzyme Q-10 30 MG capsule Take 30 mg by  mouth daily.  Marland Kitchen donepezil (ARICEPT) 10 MG tablet Take 10 mg by mouth every morning.   . fenofibrate (TRICOR) 145 MG tablet TAKE 1 TABLET EVERY TUESDAY,THURSDAY,AND SATURDAY AT 6 PM  . HYDROcodone-acetaminophen (NORCO/VICODIN) 5-325 MG tablet Take 1 tablet by mouth daily as needed for pain.  . Lactobacillus (PROBIOTIC ACIDOPHILUS PO) Take 1 capsule by mouth daily.  Marland Kitchen lamoTRIgine (LAMICTAL) 150 MG tablet Take 150 mg by mouth 2 (two) times daily.  . memantine (NAMENDA) 10 MG tablet Take 10 mg by mouth 2 (two) times daily.    . metoprolol  tartrate (LOPRESSOR) 25 MG tablet Take 1 tablet (25 mg total) by mouth 2 (two) times daily.  . Multiple Vitamins-Minerals (MULTIVITAMIN ADULT PO) Take 1 tablet by mouth daily.  Marland Kitchen OLANZapine (ZYPREXA) 15 MG tablet Take 30 mg by mouth at bedtime.    Marland Kitchen QUEtiapine (SEROQUEL) 25 MG tablet Take 50 mg by mouth at bedtime.   . ramelteon (ROZEREM) 8 MG tablet Take 8 mg by mouth at bedtime.    Marland Kitchen spironolactone (ALDACTONE) 25 MG tablet TAKE 1 TABLET BY MOUTH DAILY  . SYNTHROID 150 MCG tablet TAKE 1 TABLET (150 MCG TOTAL) BY MOUTH DAILY.  Marland Kitchen trolamine salicylate (ASPERCREME) 10 % cream Apply 1 application topically as needed for muscle pain.  . benzonatate (TESSALON) 100 MG capsule Take 1 capsule (100 mg total) by mouth every 8 (eight) hours. (Patient not taking: Reported on 06/28/2018)  . cephALEXin (KEFLEX) 500 MG capsule Take 1 capsule (500 mg total) by mouth 4 (four) times daily. (Patient not taking: Reported on 06/28/2018)  . phenazopyridine (PYRIDIUM) 200 MG tablet Take 1 tablet (200 mg total) by mouth 3 (three) times daily. (Patient not taking: Reported on 06/28/2018)    FAMILY HISTORY:  Her family history includes Hypertension in her mother.  SOCIAL HISTORY: She  reports that she quit smoking about 39 years ago. She has never used smokeless tobacco. She reports that she drinks alcohol. She reports that she does not use drugs.  REVIEW OF SYSTEMS:   All negative except above  SUBJECTIVE:  Feels better after thoracentesis.  VITAL SIGNS: BP (!) 126/57 (BP Location: Right Arm)   Pulse 71   Temp 98.4 F (36.9 C) (Oral)   Resp 18   Ht 5\' 2"  (1.575 m) Comment: 5'2"  Wt 170 lb 3.1 oz (77.2 kg)   SpO2 94%   BMI 31.13 kg/m   INTAKE / OUTPUT: No intake/output data recorded.  PHYSICAL EXAMINATION:  General - alert Eyes - pupils reactive ENT - no sinus tenderness, no stridor Cardiac - regular rate/rhythm, no murmur Chest - equal breath sounds b/l, no wheezing or rales Abdomen - soft, non  tender, + bowel sounds, no hepatosplenomegaly GU - no lesions noted Extremities - no cyanosis, clubbing, or edema Skin - no rashes Lymphatics - no lymphadenopathy Neuro - CN intact, normal strength, moves extremities, follows commands Psych - normal mood and behavior    LABS:  BMET Recent Labs  Lab 06/26/18 1718 06/28/18 0747  NA 143 143  K 4.2 4.1  CL 105 107  CO2 24 26  BUN 9 13  CREATININE 0.87 0.90  GLUCOSE 119* 108*    Electrolytes Recent Labs  Lab 06/26/18 1718 06/28/18 0747  CALCIUM 10.2 10.2  MG  --  2.0    CBC Recent Labs  Lab 06/26/18 1718 06/28/18 0747  WBC 5.5 5.2  HGB 14.3 13.9  HCT 44.7 43.7  PLT 296 300    Liver  Enzymes Recent Labs  Lab 06/26/18 1718  AST 24  ALT 19  ALKPHOS 33*  BILITOT 0.5  ALBUMIN 4.4    Imaging Dg Chest 1 View  Result Date: 06/28/2018 CLINICAL DATA:  Status post right thoracentesis EXAM: CHEST  1 VIEW COMPARISON:  Chest radiograph from earlier today. FINDINGS: Stable cardiomediastinal silhouette with top-normal heart size. No pneumothorax. Small right pleural effusion is decreased. No left pleural effusion. No pulmonary edema. Improved aeration at the right lung base with decreased patchy right lung base opacity. IMPRESSION: 1. No right pneumothorax. Small right pleural effusion is decreased. 2. Decreased patchy right lung base opacity. Chest radiograph follow-up to resolution recommended. Electronically Signed   By: Ilona Sorrel M.D.   On: 06/28/2018 11:45   Dg Chest Portable 1 View  Result Date: 06/28/2018 CLINICAL DATA:  Short of breath EXAM: PORTABLE CHEST 1 VIEW COMPARISON:  06/26/2018 FINDINGS: Right pleural effusion and right lower lobe airspace disease unchanged. Left lung remains clear. Cardiac enlargement without heart failure. IMPRESSION: Right pleural effusion and right lower lobe airspace disease unchanged. No new findings Electronically Signed   By: Franchot Gallo M.D.   On: 06/28/2018 08:12   Ir  Thoracentesis Asp Pleural Space W/img Guide  Result Date: 06/28/2018 INDICATION: Worsening SHOB x 3 weeks; presented to ED for same 7/20 and 7/22. Right pleural effusion noted on CXR from 7/20 and 7/22; previous CXR on 6/16 without pleural effusion noted. Request for diagnostic and therapeutic thoracentesis today. EXAM: ULTRASOUND GUIDED RIGHT THORACENTESIS MEDICATIONS: 10 mL 2% lidocaine. COMPLICATIONS: None immediate. PROCEDURE: An ultrasound guided thoracentesis was thoroughly discussed with the patient and questions answered. The benefits, risks, alternatives and complications were also discussed. The patient understands and wishes to proceed with the procedure. Written consent was obtained. Ultrasound was performed to localize and mark an adequate pocket of fluid in the right chest. The area was then prepped and draped in the normal sterile fashion. 2% Lidocaine was used for local anesthesia. Under ultrasound guidance a 6 Fr Safe-T-Centesis catheter was introduced. Thoracentesis was performed. The catheter was removed and a dressing applied. FINDINGS: A total of approximately 1.5L of milky blood tinged fluid was removed. Samples were sent to the laboratory as requested by the clinical team. IMPRESSION: Successful ultrasound guided right thoracentesis yielding 1.5L of pleural fluid. Read by Candiss Norse, PA-C Electronically Signed   By: Markus Daft M.D.   On: 06/28/2018 12:32     STUDIES:  Rt thoracentesis 7/22 >> 1.5 liters, milky/blood tinged, glucose 93, WBC 3125 (84%L), interfering substance in LDH/Protein analysis Echo 7/22 >> CT chest 7/22 >>   CULTURES: Rt pleural fluid 7/22 >>   DISCUSSION: 77 yo female smoker smoker with Rt pleural effusion after Heller Myotomy for Achalasia in May 2019 and Upper Respiratory Infection at end of June to beginning of July 2019.  Interfering substance in pleural fluid precludes analysis for protein and LDH.  Pleural fluid glucose in normal range.   Pleural fluid has lymphocyte predominance.  ASSESSMENT / PLAN:  Right pleural effusion. - f/u pleural fluid triglyceride, culture, cytology from 7/22 - agree with CT chest  Chesley Mires, MD [place] 06/28/2018, 3:54 PM

## 2018-06-28 NOTE — Procedures (Signed)
PROCEDURE SUMMARY:  Successful image-guided right thoracentesis. Yielded 1.5 liters of milky blood tinged fluid. Patient tolerated procedure well. No immediate complications.  Specimen was sent for labs. CXR ordered.  Joaquim Nam PA-C 06/28/2018 11:15 AM

## 2018-06-28 NOTE — ED Notes (Signed)
Attempted to call report

## 2018-06-29 ENCOUNTER — Telehealth: Payer: Self-pay | Admitting: Acute Care

## 2018-06-29 ENCOUNTER — Inpatient Hospital Stay (HOSPITAL_BASED_OUTPATIENT_CLINIC_OR_DEPARTMENT_OTHER): Payer: Medicare HMO

## 2018-06-29 DIAGNOSIS — M7989 Other specified soft tissue disorders: Secondary | ICD-10-CM

## 2018-06-29 DIAGNOSIS — R0902 Hypoxemia: Secondary | ICD-10-CM | POA: Diagnosis present

## 2018-06-29 DIAGNOSIS — J90 Pleural effusion, not elsewhere classified: Secondary | ICD-10-CM | POA: Diagnosis not present

## 2018-06-29 DIAGNOSIS — R0602 Shortness of breath: Secondary | ICD-10-CM | POA: Diagnosis not present

## 2018-06-29 LAB — COMPREHENSIVE METABOLIC PANEL
ALT: 16 U/L (ref 0–44)
ANION GAP: 7 (ref 5–15)
AST: 21 U/L (ref 15–41)
Albumin: 3.4 g/dL — ABNORMAL LOW (ref 3.5–5.0)
Alkaline Phosphatase: 29 U/L — ABNORMAL LOW (ref 38–126)
BUN: 14 mg/dL (ref 8–23)
CALCIUM: 9.3 mg/dL (ref 8.9–10.3)
CO2: 28 mmol/L (ref 22–32)
Chloride: 107 mmol/L (ref 98–111)
Creatinine, Ser: 0.92 mg/dL (ref 0.44–1.00)
GFR calc non Af Amer: 58 mL/min — ABNORMAL LOW (ref >60)
GLUCOSE: 129 mg/dL — AB (ref 70–99)
Potassium: 3.6 mmol/L (ref 3.5–5.1)
Sodium: 142 mmol/L (ref 135–145)
TOTAL PROTEIN: 6 g/dL — AB (ref 6.5–8.1)
Total Bilirubin: 0.8 mg/dL (ref 0.3–1.2)

## 2018-06-29 LAB — TRIGLYCERIDES, BODY FLUIDS: Triglycerides, Fluid: 3571 mg/dL

## 2018-06-29 LAB — CBC
HCT: 40.7 % (ref 36.0–46.0)
HEMOGLOBIN: 13.1 g/dL (ref 12.0–15.0)
MCH: 31 pg (ref 26.0–34.0)
MCHC: 32.2 g/dL (ref 30.0–36.0)
MCV: 96.4 fL (ref 78.0–100.0)
Platelets: 278 10*3/uL (ref 150–400)
RBC: 4.22 MIL/uL (ref 3.87–5.11)
RDW: 13.8 % (ref 11.5–15.5)
WBC: 6.3 10*3/uL (ref 4.0–10.5)

## 2018-06-29 LAB — ACID FAST SMEAR (AFB): ACID FAST SMEAR - AFSCU2: NEGATIVE

## 2018-06-29 NOTE — Discharge Summary (Signed)
Physician Discharge Summary  [name] DTO:[id number] DOB: Dec 26, 1940 DOA: 06/28/2018  PCP: [name], MD  Admit date: 06/28/2018 Discharge date: 06/29/2018  Admitted From: Home  Disposition: Hom   Recommendations for Outpatient Follow-up:  1. Follow up with PCP in 1-2 weeks 2. Please obtain BMP/CBC in one week 3. Please follow up on the following pending results: pleural fluid culture results.  4. Needs follow chest x ary     Discharge Condition: stable.  CODE STATUS: full Code Diet recommendation: Heart Healthy    Brief/Interim Summary: HPI:   77 year old female with a history of morbid obesity, bipolar 1 disorder, dyslipidemia, hypertension, hypothyroidism who presents to the ED with shortness of breath. Patient states that she had "heller's myotomy"in May. She had a rough time recovering from the surgery. She had significant pain for 1-1/2 months. She has not been very active during the last couple of months. About a month ago she developed a URI, which caused her to have excessive shortness of breath with dyspnea on exertion. Patient was prescribed Tessalon Perles but no antibiotic. She had persistent cough for about 3-1/2 weeks. She presented to the ED on 7/20 after an urgent care visit that showed a new right-sided moderate pleural effusion. She's had some orthopnea, dependent edema. She was discharged home on 7/20 with recommendations for an outpatient ultrasound guided thoracentesis and outpatient pulmonary follow-up.chest x-ray on 7/20 showed new large unilateral right-sided pleural effusion  Patient presented today with worsening shortness of breath. Unable to read ED course BP 123/78 (BP Location: Right Arm)  Pulse 74  Temp 98.3 F (36.8 C) (Oral)  Resp 16  Ht 5\' 2"  (1.575 m)  Wt 78.9 kg (174 lb)  SpO2 90%  BMI 31.83 kg/m  EKG showed normal sinus rhythm right bundle branch block, left ventricular hypertrophy with repolarization abnormality Chest x-ray  unchanged Patient admitted for further evaluation of her shortness of breath     Discharge Diagnoses:  Principal Problem:   SOB (shortness of breath) on exertion Active Problems:   Hypercholesterolemia   Hypothyroidism   Dementia   Benign hypertensive heart disease without heart failure   Irritable bowel syndrome with constipation and diarrhea   Nocturia more than twice per night   Pleural effusion on right   Hypoxemia   1-Large right side pleural effusion./ symptomatic ; Post thoracentesis; pleural fluid; 1.5 L removed, Turbid, WBC 3,125, culture pending.  ECHO; Diastolic dysfunction  D dimer ;2.0 CT angio; Small bilateral pleural effusions, right greater than left. Patchy ground-glass airspace opacities in both lungs, most notable in the right lower lobe and right upper lobe concerning for multifocal pneumonia. Early edema could have a similar appearance. No evidence of pulmonary embolus. -Pulmonary following. 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 Plan to discharge patient and repeat Chest x ray on follow visit on friday    Hypercholesterolemia ContinueTriCor  Hypothyroidism Continue Synthroid, check TSH  Dementia Patient is on multiple psychotropic medications, continue Aricept  Benign hypertensive heart disease without heart failure Continue Aldactone,  Schizophrenia Continue Seroquel,rozerem      Discharge Instructions  Discharge Instructions    Diet - low sodium heart healthy   Complete by:  As directed    Increase activity slowly   Complete by:  As directed      Allergies as of 06/29/2018      Reactions   Lithium Nausea Only   Fluoxetine Other (See Comments)   Pt felt crazy   Macrodantin Other (See Comments)   unknown   Mirabegron  Other (See Comments)   ineffective   Paroxetine Hcl    Other reaction(s): Other (See Comments) Made pt feel crazy   Paxil [paroxetine] Other (See Comments)   Made pt feel crazy   Prozac [fluoxetine Hcl] Other  (See Comments)   Pt felt crazy   Sertraline Hcl    Solifenacin Other (See Comments)   Ineffective    Sulfa Antibiotics Other (See Comments)   unknown   Wellbutrin [bupropion Hcl] Other (See Comments)   unknown      Medication List    STOP taking these medications   benzonatate 100 MG capsule Commonly known as:  TESSALON   cephALEXin 500 MG capsule Commonly known as:  KEFLEX   phenazopyridine 200 MG tablet Commonly known as:  PYRIDIUM     TAKE these medications   albuterol 108 (90 Base) MCG/ACT inhaler Commonly known as:  PROVENTIL HFA;VENTOLIN HFA Inhale 2 puffs into the lungs every 6 (six) hours as needed for wheezing or shortness of breath.   ALPRAZolam 0.5 MG tablet Commonly known as:  XANAX Take 0.5-2 mg by mouth 4 (four) times daily as needed for anxiety. Patient may take 3-4 tablets by mouth as needed at bedtime for sleep and 1-3 tablets by mouth three times daily as needed for anxiety   aspirin 81 MG tablet Take 81 mg by mouth daily.   CALCIUM 600 PO Take 1 tablet by mouth daily.   co-enzyme Q-10 30 MG capsule Take 30 mg by mouth daily.   donepezil 10 MG tablet Commonly known as:  ARICEPT Take 10 mg by mouth every morning.   fenofibrate 145 MG tablet Commonly known as:  TRICOR TAKE 1 TABLET EVERY TUESDAY,THURSDAY,AND SATURDAY AT 6 PM   HYDROcodone-acetaminophen 5-325 MG tablet Commonly known as:  NORCO/VICODIN Take 1 tablet by mouth daily as needed for pain.   lamoTRIgine 150 MG tablet Commonly known as:  LAMICTAL Take 150 mg by mouth 2 (two) times daily.   memantine 10 MG tablet Commonly known as:  NAMENDA Take 10 mg by mouth 2 (two) times daily.   metoprolol tartrate 25 MG tablet Commonly known as:  LOPRESSOR Take 1 tablet (25 mg total) by mouth 2 (two) times daily.   MULTIVITAMIN ADULT PO Take 1 tablet by mouth daily.   OLANZapine 15 MG tablet Commonly known as:  ZYPREXA Take 30 mg by mouth at bedtime.   PROBIOTIC ACIDOPHILUS PO Take  1 capsule by mouth daily.   QUEtiapine 25 MG tablet Commonly known as:  SEROQUEL Take 50 mg by mouth at bedtime.   ramelteon 8 MG tablet Commonly known as:  ROZEREM Take 8 mg by mouth at bedtime.   spironolactone 25 MG tablet Commonly known as:  ALDACTONE TAKE 1 TABLET BY MOUTH DAILY   SYNTHROID 150 MCG tablet Generic drug:  levothyroxine TAKE 1 TABLET (150 MCG TOTAL) BY MOUTH DAILY.   trolamine salicylate 10 % cream Commonly known as:  ASPERCREME Apply 1 application topically as needed for muscle pain.   TYLENOL EXTRA STRENGTH PO Take by mouth 2 (two) times daily.      Follow-up Information    Martyn Ehrich, NP Follow up on 07/02/2018.   Specialty:  Pulmonary Disease Why:  Follow up appointment at 10:30, please arrive at 10 am for CXR prior. Contact information: [address] [place] [address] [phone]          Allergies  Allergen Reactions  . Lithium Nausea Only  . Fluoxetine Other (  See Comments)    Pt felt crazy  . Macrodantin Other (See Comments)    unknown  . Mirabegron Other (See Comments)    ineffective  . Paroxetine Hcl     Other reaction(s): Other (See Comments) Made pt feel crazy  . Paxil [Paroxetine] Other (See Comments)    Made pt feel crazy   . Prozac [Fluoxetine Hcl] Other (See Comments)    Pt felt crazy   . Sertraline Hcl   . Solifenacin Other (See Comments)    Ineffective   . Sulfa Antibiotics Other (See Comments)    unknown  . Wellbutrin [Bupropion Hcl] Other (See Comments)    unknown    Consultations:  Pulmonology    Procedures/Studies: Dg Chest 1 View  Result Date: 06/28/2018 CLINICAL DATA:  Status post right thoracentesis EXAM: CHEST  1 VIEW COMPARISON:  Chest radiograph from earlier today. FINDINGS: Stable cardiomediastinal silhouette with top-normal heart size. No pneumothorax. Small right pleural effusion is decreased. No left pleural effusion. No pulmonary edema. Improved aeration at the right  lung base with decreased patchy right lung base opacity. IMPRESSION: 1. No right pneumothorax. Small right pleural effusion is decreased. 2. Decreased patchy right lung base opacity. Chest radiograph follow-up to resolution recommended. Electronically Signed   By: Ilona Sorrel M.D.   On: 06/28/2018 11:45   Dg Chest 2 View  Result Date: 06/26/2018 CLINICAL DATA:  Severe viral infection.  Progressive short of breath EXAM: CHEST - 2 VIEW COMPARISON:  05/23/2018 FINDINGS: Normal cardiac silhouette. Large RIGHT pleural effusion is increased significantly from comparison exam. No airspace disease. LEFT lung clear. No acute osseous abnormality. IMPRESSION: New large unilateral RIGHT pleural effusion. Electronically Signed   By: Suzy Bouchard M.D.   On: 06/26/2018 16:27   Ct Angio Chest Pe W Or Wo Contrast  Result Date: 06/28/2018 CLINICAL DATA:  Shortness of breath.  Recent thoracentesis EXAM: CT ANGIOGRAPHY CHEST WITH CONTRAST TECHNIQUE: Multidetector CT imaging of the chest was performed using the standard protocol during bolus administration of intravenous contrast. Multiplanar CT image reconstructions and MIPs were obtained to evaluate the vascular anatomy. CONTRAST:  64mL ISOVUE-370 IOPAMIDOL (ISOVUE-370) INJECTION 76% COMPARISON:  Chest x-ray 06/28/2018. FINDINGS: Cardiovascular: Cardiomegaly. No evidence of aortic aneurysm. Scattered aortic calcifications and coronary artery calcifications, most pronounced in the left anterior descending coronary artery. No filling defects in the pulmonary arteries to suggest pulmonary emboli. Mediastinum/Nodes: No mediastinal, hilar, or axillary adenopathy. Lungs/Pleura: Small bilateral pleural effusions, right greater than left. Compressive atelectasis in the right lower lobe. Patchy ground-glass airspace opacities in both lungs, most pronounced in the right lower lobe and right upper lobe. This could reflect early edema, but is concerning for infection/pneumonia. Upper  Abdomen: Moderate perihepatic ascites. Musculoskeletal: Chest wall soft tissues are unremarkable. No acute bony abnormality. Review of the MIP images confirms the above findings. IMPRESSION: Small bilateral pleural effusions, right greater than left. Patchy ground-glass airspace opacities in both lungs, most notable in the right lower lobe and right upper lobe concerning for multifocal pneumonia. Early edema could have a similar appearance. No evidence of pulmonary embolus. Coronary artery disease.  Cardiomegaly. Electronically Signed   By: Rolm Baptise M.D.   On: 06/28/2018 17:37   Dg Chest Portable 1 View  Result Date: 06/28/2018 CLINICAL DATA:  Short of breath EXAM: PORTABLE CHEST 1 VIEW COMPARISON:  06/26/2018 FINDINGS: Right pleural effusion and right lower lobe airspace disease unchanged. Left lung remains clear. Cardiac enlargement without heart failure. IMPRESSION: Right pleural effusion and right  lower lobe airspace disease unchanged. No new findings Electronically Signed   By: Franchot Gallo M.D.   On: 06/28/2018 08:12   Ir Thoracentesis Asp Pleural Space W/img Guide  Result Date: 06/28/2018 INDICATION: Worsening SHOB x 3 weeks; presented to ED for same 7/20 and 7/22. Right pleural effusion noted on CXR from 7/20 and 7/22; previous CXR on 6/16 without pleural effusion noted. Request for diagnostic and therapeutic thoracentesis today. EXAM: ULTRASOUND GUIDED RIGHT THORACENTESIS MEDICATIONS: 10 mL 2% lidocaine. COMPLICATIONS: None immediate. PROCEDURE: An ultrasound guided thoracentesis was thoroughly discussed with the patient and questions answered. The benefits, risks, alternatives and complications were also discussed. The patient understands and wishes to proceed with the procedure. Written consent was obtained. Ultrasound was performed to localize and mark an adequate pocket of fluid in the right chest. The area was then prepped and draped in the normal sterile fashion. 2% Lidocaine was used  for local anesthesia. Under ultrasound guidance a 6 Fr Safe-T-Centesis catheter was introduced. Thoracentesis was performed. The catheter was removed and a dressing applied. FINDINGS: A total of approximately 1.5L of milky blood tinged fluid was removed. Samples were sent to the laboratory as requested by the clinical team. IMPRESSION: Successful ultrasound guided right thoracentesis yielding 1.5L of pleural fluid. Read by Candiss Norse, PA-C Electronically Signed   By: Markus Daft M.D.   On: 06/28/2018 12:32    -  ECHO  Left ventricle: The cavity size was normal. There was mild   concentric hypertrophy. Systolic function was hyperdynamic. The   estimated ejection fraction was in the range of 75% to 80%. There   was no dynamic obstruction. Wall motion was normal; there were no   regional wall motion abnormalities. Doppler parameters are   consistent with abnormal left ventricular relaxation (grade 1   diastolic dysfunction). - Aortic valve: There was trivial regurgitation. Valve area (VTI):   2.85 cm^2. Valve area (Vmax): 2.45 cm^2. Valve area (Vmean): 2.73   cm^2. - Mitral valve: Calcified annulus.    Subjective: She is breathing better, feeling better  Discharge Exam: Vitals:   06/28/18 2334 06/29/18 0811  BP: (!) 122/56 (!) 135/54  Pulse: 70 71  Resp: 12 12  Temp: 98.6 F (37 C) 97.9 F (36.6 C)  SpO2: 91% 94%   Vitals:   06/28/18 1616 06/28/18 2126 06/28/18 2334 06/29/18 0811  BP: (!) 154/52 136/62 (!) 122/56 (!) 135/54  Pulse: 89 75 70 71  Resp: 19 18 12 12   Temp: 98.4 F (36.9 C) 97.7 F (36.5 C) 98.6 F (37 C) 97.9 F (36.6 C)  TempSrc: Oral Oral Oral Oral  SpO2: 93% 95% 91% 94%  Weight:      Height:        General: Pt is alert, awake, not in acute distress Cardiovascular: RRR, S1/S2 +, no rubs, no gallops Respiratory: CTA bilaterally, no wheezing, no rhonchi Abdominal: Soft, NT, ND, bowel sounds + Extremities: no edema, no cyanosis    The results  of significant diagnostics from this hospitalization (including imaging, microbiology, ancillary and laboratory) are listed below for reference.     Microbiology: Recent Results (from the past 240 hour(s))  Body fluid culture     Status: None (Preliminary result)   Collection Time: 06/28/18 11:16 AM  Result Value Ref Range Status   Specimen Description PLEURAL RIGHT  Final   Special Requests NONE  Final   Gram Stain   Final    FEW WBC PRESENT, PREDOMINANTLY MONONUCLEAR NO ORGANISMS SEEN  Culture   Final    NO GROWTH < 24 HOURS Performed at [facility], [place] [address] [place], [place] [address]    Report Status PENDING  Incomplete     Labs: BNP (last 3 results) No results for input(s): BNP in the last 8760 hours. Basic Metabolic Panel: Recent Labs  Lab 06/26/18 1718 06/28/18 0747 06/29/18 0236  NA 143 143 142  K 4.2 4.1 3.6  CL 105 107 107  CO2 24 26 28   GLUCOSE 119* 108* 129*  BUN 9 13 14   CREATININE 0.87 0.90 0.92  CALCIUM 10.2 10.2 9.3  MG  --  2.0  --    Liver Function Tests: Recent Labs  Lab 06/26/18 1718 06/29/18 0236  AST 24 21  ALT 19 16  ALKPHOS 33* 29*  BILITOT 0.5 0.8  PROT 7.6 6.0*  ALBUMIN 4.4 3.4*   No results for input(s): LIPASE, AMYLASE in the last 168 hours. No results for input(s): AMMONIA in the last 168 hours. CBC: Recent Labs  Lab 06/26/18 1718 06/28/18 0747 06/29/18 0236  WBC 5.5 5.2 6.3  HGB 14.3 13.9 13.1  HCT 44.7 43.7 40.7  MCV 95.9 96.9 96.4  PLT 296 300 278   Cardiac Enzymes: No results for input(s): CKTOTAL, CKMB, CKMBINDEX, TROPONINI in the last 168 hours. BNP: Invalid input(s): POCBNP CBG: No results for input(s): GLUCAP in the last 168 hours. D-Dimer Recent Labs    06/28/18 1001  DDIMER 2.09*   Hgb A1c No results for input(s): HGBA1C in the last 72 hours. Lipid Profile No results for input(s): CHOL, HDL, LDLCALC, TRIG, CHOLHDL, LDLDIRECT in the last 72 hours. Thyroid function studies No  results for input(s): TSH, T4TOTAL, T3FREE, THYROIDAB in the last 72 hours.  Invalid input(s): FREET3 Anemia work up No results for input(s): VITAMINB12, FOLATE, FERRITIN, TIBC, IRON, RETICCTPCT in the last 72 hours. Urinalysis    Component Value Date/Time   COLORURINE YELLOW 05/03/2018 0826   APPEARANCEUR HAZY (A) 05/03/2018 0826   LABSPEC 1.015 05/03/2018 0826   PHURINE 7.0 05/03/2018 0826   GLUCOSEU NEGATIVE 05/03/2018 0826   HGBUR LARGE (A) 05/03/2018 0826   BILIRUBINUR NEGATIVE 05/03/2018 0826   KETONESUR NEGATIVE 05/03/2018 0826   PROTEINUR 30 (A) 05/03/2018 0826   NITRITE NEGATIVE 05/03/2018 0826   LEUKOCYTESUR LARGE (A) 05/03/2018 0826   Sepsis Labs Invalid input(s): PROCALCITONIN,  WBC,  LACTICIDVEN Microbiology Recent Results (from the past 240 hour(s))  Body fluid culture     Status: None (Preliminary result)   Collection Time: 06/28/18 11:16 AM  Result Value Ref Range Status   Specimen Description PLEURAL RIGHT  Final   Special Requests NONE  Final   Gram Stain   Final    FEW WBC PRESENT, PREDOMINANTLY MONONUCLEAR NO ORGANISMS SEEN    Culture   Final    NO GROWTH < 24 HOURS Performed at [facility], [place] [address] [place], [place] [address]    Report Status PENDING  Incomplete     Time coordinating discharge: 35 minutes.   SIGNED:   Elmarie Shiley, MD  [facility] 06/29/2018, 12:02 PM Pager   If 7PM-7AM, please contact night-coverage [email or web address] Password [email or web address]

## 2018-06-29 NOTE — Progress Notes (Signed)
LE venous duplex prelim: negative for DVT. Zyanne Schumm Eunice, RDMS, RVT  

## 2018-06-29 NOTE — Consult Note (Signed)
            [facility] CM Primary Care Navigator  06/29/2018  [name] 06-10-1941 [id number]   Went to see patientat the bedsideto identify possible discharge needs. Patientmentioned that sheresides at [place] facility.  Patientreports that she had "difficulty breathing" which had led to this admission. (new large unilateral right-sided pleural effusion)   Patient states using [facility] on [address] to obtain medications without difficulty. She reports that dispensing and managing of her medications is done by facility RN every month but she takes her medications on her own. She mentioned that facility transport/ staff  provides transportation to her doctors' appointments.  According to patient, Dr. [name] with [facility] at [place] is her primary care provider   Perpatient, discharge plan is to go back to [place]. Patient indicated that she will be arranging home aide from [facility] [place] if assistance is needed with her care needs.  Patientvoiced understandingto callprimary care provider'soffice whenshe returns backhome,for a post discharge follow-upvisitwithin1- 2 weeksor sooner if needs arise.Patient letter (with PCP's contact number) was providedasareminder.   Explained topatientregarding [facility] CM services available for health management and resources but she deniesany needs orconcernsfor now and states that she is capable of managing her health issues so far. Patient verbalizedunderstandingof needto seekreferral from primary care provider to [facility] care management ifdeemed necessary and appropriatefor anyservicesin the future.   [facility] care management information was provided for futureneeds thatshemay have.  Primary care provider's office is listed as providing transition of care (TOC) follow-up.  Patienthad verbally agreedand optedforEMMIcalls tofollow-up  withherrecoveryat home.   Referral made for [facility] calls after discharge.    For additional questions please contact:  Edwena Felty A. Sharonda Llamas, BSN, RN-BC [facility] PRIMARY CARE Navigator Cell: [phone]

## 2018-06-29 NOTE — Care Management CC44 (Signed)
Condition Code 44 Documentation Completed  Patient Details  Name: [name] MRN: [id number] Date of Birth: 12-22-1940   Condition Code 75 given:  Yes Patient signature on Condition Code 44 notice:  Yes Documentation of 2 MD's agreement:  Yes Code 44 added to claim:  Yes    Zenon Mayo, RN 06/29/2018, 12:06 PM

## 2018-06-29 NOTE — Telephone Encounter (Signed)
Will route to Sela Hilding, NP

## 2018-06-29 NOTE — Care Management Obs Status (Signed)
[place] [place] NOTIFICATION   Patient Details  Name: MAYANNA GARLITZ MRN: [id number] Date of Birth: 03/06/1941   Medicare Observation Status Notification Given:  Yes    Zenon Mayo, RN 06/29/2018, 12:06 PM

## 2018-06-29 NOTE — Progress Notes (Signed)
Interim  Progress Note Triglycerides in pleural fluid have resulted at 3,571, confirming suspicion of Chylothorax. We will see the patient for CXR and follow up in the Pulmonary office, but we will refer the patient back to [facility] where she had her surgery done on 04/08/2018. 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 I have called today and left a message with Dr. Thana Farr office at [phone] ( RN N[place]'s voice mail) requesting that they get the patient in for follow up and management of this chylothorax. I have requested a return call to allow for sharing of information regarding current hospitalization.   Magdalen Spatz, AGACNP-BC [facility] Pager [phone] 06/29/2018 2:18 PM

## 2018-06-29 NOTE — Plan of Care (Signed)
Discussed plan of care with patient.  Emphasized the importance of using the call button when patient has a need.  Patient prefers to use a bedpan instead of the [facility].  Good teach back displayed.

## 2018-06-29 NOTE — Consult Note (Signed)
PULMONARY / CRITICAL CARE MEDICINE   Name: [name] MRN: [id number] DOB: 03-31-1941    ADMISSION DATE:  06/28/2018 CONSULTATION DATE: 06/28/2018  REFERRING MD:  Dr. Allyson Sabal, [facility]  CHIEF COMPLAINT:  Short of breath  HISTORY OF PRESENT ILLNESS:   77 yo female former smoker presented to ER with progressive dyspnea.  She had a respiratory infection about 1 month prior to admission and her breathing has gotten worse since then.  She also has a cough and the beginning associated with clear to yellow sputum.  She had CXR on 7/20 that showed a pleural effusion on the Rt.  She was set up for thoracentesis with IR as an outpt.  Her symptoms got worse, and she returned to the ER.  She was noted to have hypoxia in the ER.  She had thoracentesis by IR on 7/22 with 1.5 liters of milky, blood tinged fluid removed.  Fluid analysis shows glucose 93, WBC 3125 with 84% lymphocytes.  Apparently there was an interfering substance so LDH and protein could not be measured in pleural fluid.  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  Her breathing is better after thoracentesis.  No history of pneumonia or tuberculosis.  Quit smoking years ago.  Never was told she had an effusion before.  Was having trouble with swallowing, but much improved since myotomy.  Denies fever, hemoptysis, skin rash, joint swelling, leg swelling.  SUBJECTIVE:  Continues to feel better. Breathing easier since thoracentesis. Still experiences SOB on exertion but states she has had this for years. Marland Kitchen  VITAL SIGNS: BP (!) 135/54 (BP Location: Right Arm)   Pulse 71   Temp 97.9 F (36.6 C) (Oral)   Resp 12   Ht 5\' 2"  (1.575 m) Comment: 5'2"  Wt 170 lb 3.1 oz (77.2 kg)   SpO2 94%   BMI 31.13 kg/m   INTAKE / OUTPUT: I/O last 3 completed shifts: In: 480 [P.O.:480] Out: 1300 [Urine:1300]  PHYSICAL EXAMINATION:  General - alert, appropriate Eyes - pupils equal and reactive ENT - no sinus tenderness, no  stridor, No LAD Cardiac - S1, S2, RRR, NO G, R, ? Murmur Chest - Bilateral chest excursion,  no wheezing or rales, diminished per bases Abdomen - soft, non tender,ND, BS +, Body mass index is 31.13 kg/m. GU - Not examined Extremities - no cyanosis, clubbing, or edema, warm and dry with brisk capillary refill Skin - no rashes, no lesions noted Lymphatics - no lymphadenopathy Neuro - CN intact, normal strength, walks with walker, moves extremities x 4, follows commands Psych - normal mood and behavior, anxious about fluid results    LABS:  BMET Recent Labs  Lab 06/26/18 1718 06/28/18 0747 06/29/18 0236  NA 143 143 142  K 4.2 4.1 3.6  CL 105 107 107  CO2 24 26 28   BUN 9 13 14   CREATININE 0.87 0.90 0.92  GLUCOSE 119* 108* 129*    Electrolytes Recent Labs  Lab 06/26/18 1718 06/28/18 0747 06/29/18 0236  CALCIUM 10.2 10.2 9.3  MG  --  2.0  --     CBC Recent Labs  Lab 06/26/18 1718 06/28/18 0747 06/29/18 0236  WBC 5.5 5.2 6.3  HGB 14.3 13.9 13.1  HCT 44.7 43.7 40.7  PLT 296 300 278    Liver Enzymes Recent Labs  Lab 06/26/18 1718 06/29/18 0236  AST 24 21  ALT 19 16  ALKPHOS 33* 29*  BILITOT 0.5 0.8  ALBUMIN 4.4 3.4*  Imaging Dg Chest 1 View  Result Date: 06/28/2018 CLINICAL DATA:  Status post right thoracentesis EXAM: CHEST  1 VIEW COMPARISON:  Chest radiograph from earlier today. FINDINGS: Stable cardiomediastinal silhouette with top-normal heart size. No pneumothorax. Small right pleural effusion is decreased. No left pleural effusion. No pulmonary edema. Improved aeration at the right lung base with decreased patchy right lung base opacity. IMPRESSION: 1. No right pneumothorax. Small right pleural effusion is decreased. 2. Decreased patchy right lung base opacity. Chest radiograph follow-up to resolution recommended. Electronically Signed   By: Ilona Sorrel M.D.   On: 06/28/2018 11:45   Ct Angio Chest Pe W Or Wo Contrast  Result Date:  06/28/2018 CLINICAL DATA:  Shortness of breath.  Recent thoracentesis EXAM: CT ANGIOGRAPHY CHEST WITH CONTRAST TECHNIQUE: Multidetector CT imaging of the chest was performed using the standard protocol during bolus administration of intravenous contrast. Multiplanar CT image reconstructions and MIPs were obtained to evaluate the vascular anatomy. CONTRAST:  56mL ISOVUE-370 IOPAMIDOL (ISOVUE-370) INJECTION 76% COMPARISON:  Chest x-ray 06/28/2018. FINDINGS: Cardiovascular: Cardiomegaly. No evidence of aortic aneurysm. Scattered aortic calcifications and coronary artery calcifications, most pronounced in the left anterior descending coronary artery. No filling defects in the pulmonary arteries to suggest pulmonary emboli. Mediastinum/Nodes: No mediastinal, hilar, or axillary adenopathy. Lungs/Pleura: Small bilateral pleural effusions, right greater than left. Compressive atelectasis in the right lower lobe. Patchy ground-glass airspace opacities in both lungs, most pronounced in the right lower lobe and right upper lobe. This could reflect early edema, but is concerning for infection/pneumonia. Upper Abdomen: Moderate perihepatic ascites. Musculoskeletal: Chest wall soft tissues are unremarkable. No acute bony abnormality. Review of the MIP images confirms the above findings. IMPRESSION: Small bilateral pleural effusions, right greater than left. Patchy ground-glass airspace opacities in both lungs, most notable in the right lower lobe and right upper lobe concerning for multifocal pneumonia. Early edema could have a similar appearance. No evidence of pulmonary embolus. Coronary artery disease.  Cardiomegaly. Electronically Signed   By: Rolm Baptise M.D.   On: 06/28/2018 17:37   Ir Thoracentesis Asp Pleural Space W/img Guide  Result Date: 06/28/2018 INDICATION: Worsening SHOB x 3 weeks; presented to ED for same 7/20 and 7/22. Right pleural effusion noted on CXR from 7/20 and 7/22; previous CXR on 6/16 without  pleural effusion noted. Request for diagnostic and therapeutic thoracentesis today. EXAM: ULTRASOUND GUIDED RIGHT THORACENTESIS MEDICATIONS: 10 mL 2% lidocaine. COMPLICATIONS: None immediate. PROCEDURE: An ultrasound guided thoracentesis was thoroughly discussed with the patient and questions answered. The benefits, risks, alternatives and complications were also discussed. The patient understands and wishes to proceed with the procedure. Written consent was obtained. Ultrasound was performed to localize and mark an adequate pocket of fluid in the right chest. The area was then prepped and draped in the normal sterile fashion. 2% Lidocaine was used for local anesthesia. Under ultrasound guidance a 6 Fr Safe-T-Centesis catheter was introduced. Thoracentesis was performed. The catheter was removed and a dressing applied. FINDINGS: A total of approximately 1.5L of milky blood tinged fluid was removed. Samples were sent to the laboratory as requested by the clinical team. IMPRESSION: Successful ultrasound guided right thoracentesis yielding 1.5L of pleural fluid. Read by Candiss Norse, PA-C Electronically Signed   By: Markus Daft M.D.   On: 06/28/2018 12:32     STUDIES:  Rt thoracentesis 7/22 >> 1.5 liters, milky/blood tinged, glucose 93, WBC 3125 (84%L), interfering substance in LDH/Protein analysis>> Lipemia Echo 7/22 >>  mild concentric hypertrophy. Systolic function was hyperdynamic. The   estimated ejection fraction was in the range of 75% to 80%. There   was no dynamic obstruction. Wall motion was normal; there were no   regional wall motion abnormalities. Doppler parameters are   consistent with abnormal left ventricular relaxation (grade 1   diastolic dysfunction). - Aortic valve: There was trivial regurgitation. Valve area (VTI):   2.85 cm^2. Valve area  (Vmax): 2.45 cm^2. Valve area (Vmean): 2.73   cm^2. - Mitral valve: Calcified annulus.  CT chest 7/22 >>  Small bilateral pleural effusions, right greater than left. Patchy ground-glass airspace opacities in both lungs, most notable in the right lower lobe and right upper lobe concerning for multifocal pneumonia. Early edema could have a similar appearance.  No evidence of pulmonary embolus. Coronary artery disease.  Cardiomegaly.  CULTURES: Rt pleural fluid 7/22 >> GS>> few WBC present predominantly mononuclear>>  DISCUSSION: 77 yo female smoker smoker ( Quit 1980) with Rt pleural effusion after Heller Myotomy for Achalasia in May 2019 and Upper Respiratory Infection at end of June to beginning of July 2019. Pleural fluid glucose in normal range.  Pleural fluid has lymphocyte predominance.  Interfering substance in pleural fluid precludes analysis for protein and LDH is noted to be Lipemia.  Possibility of chylothorax is part of the differential diagnosis.Awaiting cytology and triglyceride results to confirm.   ASSESSMENT / PLAN:  Right pleural effusion with lipemia noted as  interfering substance in LDH/Protein analysis  R/O possibility of Chylothorax Afebrile No Leukocytosis On RA with sats in the  - f/u pleural fluid triglyceride, culture, cytology from 7/22 - CT chest results noted - CXR prn - Maintain oxygen saturations> 94% - Ok to discharge from Pulmonary perspective for follow up in the office  - Follow up appointment scheduled for July 02, 2018 10:30 am with Derl Barrow, NP , with CXR prior and will discuss lab results and plan of care then.  [name], AGACNP-BC [facility] 06/29/2018, 9:21 AM

## 2018-06-30 DIAGNOSIS — I10 Essential (primary) hypertension: Secondary | ICD-10-CM | POA: Diagnosis not present

## 2018-06-30 DIAGNOSIS — J90 Pleural effusion, not elsewhere classified: Secondary | ICD-10-CM | POA: Diagnosis not present

## 2018-06-30 DIAGNOSIS — M25562 Pain in left knee: Secondary | ICD-10-CM | POA: Diagnosis not present

## 2018-06-30 DIAGNOSIS — M545 Low back pain: Secondary | ICD-10-CM | POA: Diagnosis not present

## 2018-06-30 DIAGNOSIS — M6281 Muscle weakness (generalized): Secondary | ICD-10-CM | POA: Diagnosis not present

## 2018-06-30 DIAGNOSIS — R2681 Unsteadiness on feet: Secondary | ICD-10-CM | POA: Diagnosis not present

## 2018-06-30 LAB — PH, BODY FLUID: PH, BODY FLUID: 7.8

## 2018-07-01 LAB — BODY FLUID CULTURE: Culture: NO GROWTH

## 2018-07-01 NOTE — Telephone Encounter (Signed)
Almyra Free returning call. Per Almyra Free she is in clinic all day today and will be free after 12 tomorrow. Requesting a call back then. CB is [phone].

## 2018-07-01 NOTE — Telephone Encounter (Signed)
Attempted to call [name] with [facility] with Dr. Bartholomew Boards Md office at phone [phone]. She is returning call to [place] I did not receive an answer at time of call. I have left a voicemail message for pt to return call. X1

## 2018-07-01 NOTE — Telephone Encounter (Signed)
I returned this call 7/24, and left a VM for [place].  I called again this am at 0745 , and left another VM  , and again at 11:41 today at both the office number ( where there is an extensive recording ) and to [employer] who works with Dr. Margart Sickles. I have left Almyra Free another message to call  the pulmonary office for update on the patient's  hospitalization.The patient is for follow up in the office tomorrow morning for CXR to evaluate for chylothorax  Resolution. She will be referred back to [facility] for follow up.

## 2018-07-02 ENCOUNTER — Ambulatory Visit (INDEPENDENT_AMBULATORY_CARE_PROVIDER_SITE_OTHER)
Admission: RE | Admit: 2018-07-02 | Discharge: 2018-07-02 | Disposition: A | Discharge status: Home / Self Care | Payer: Medicare HMO | Source: Ambulatory Visit | Attending: Primary Care | Admitting: Primary Care

## 2018-07-02 ENCOUNTER — Ambulatory Visit (INDEPENDENT_AMBULATORY_CARE_PROVIDER_SITE_OTHER): Payer: Medicare HMO | Admitting: Primary Care

## 2018-07-02 ENCOUNTER — Encounter: Payer: Self-pay | Admitting: Primary Care

## 2018-07-02 ENCOUNTER — Telehealth: Payer: Self-pay | Admitting: Primary Care

## 2018-07-02 VITALS — BP 134/76 | HR 97

## 2018-07-02 DIAGNOSIS — J9811 Atelectasis: Secondary | ICD-10-CM | POA: Diagnosis not present

## 2018-07-02 DIAGNOSIS — J90 Pleural effusion, not elsewhere classified: Secondary | ICD-10-CM

## 2018-07-02 NOTE — Telephone Encounter (Signed)
Patient returned call, CB is [phone].

## 2018-07-02 NOTE — Patient Instructions (Signed)
Follow up as needed with Dr. Collie Siad office   Repeat CXR in 3-4 weeks  Please call and schedule follow-up visit with Dr. Jacklynn Lewis office in [place] today showed stable small right pleural effusion

## 2018-07-02 NOTE — Telephone Encounter (Signed)
Can you call patient and have her get labs drawn sometime next week.Thanks

## 2018-07-02 NOTE — Progress Notes (Signed)
@Patient  ID: [name], female    DOB: [birth year], 77 y.o.   MRN: [id number]  Chief Complaint  Patient presents with  . Hospitalization Follow-up    pt reports she is doing well, breathing back to normal, concerned about her labs    Referring provider: [name], MD  HPI: 77 year old female, former smoker quit 40 years ago. Hx HTN, hypothyroidism, hyperlipidemia, bipolar 1 disorder, anxiety, obesity, osteoarthritis.  Recent hospital stay 7/22-7/23 for shortness of breath.  Patient had a laparoscopic  Heller's myotomy with partial fundoplication on 03/08/92 with a difficult recovery.  She had a URI on 5/16 which she was prescribed Tessalon Perles, no abx. Continue to have cough and shortness of breath for about 3 and half weeks.  Presented to ED after being seen by urgent care and chest x-ray showed new right sided moderate pleural effusion.  Patient is post thoracentesis, 1.5 L pleural fluid removed. Fluid confirms diagnosis of chylothorax. Discharged home on 7/23. 07/02/2018 Presents today for hospital follow-up, here with family member/friend.  She is doing well, states that her breathing is back to normal.  Chest x-ray today showed stable small right pleural effusion.  Energy is a little low, eating and drinking okay.  Ambulating with rolling walker.  Denies fever, shortness of breath, cough or wheeze.   Allergies  Allergen Reactions  . Lithium Nausea Only  . Fluoxetine Other (See Comments)    Pt felt crazy  . Macrodantin Other (See Comments)    unknown  . Mirabegron Other (See Comments)    ineffective  . Paroxetine Hcl     Other reaction(s): Other (See Comments) Made pt feel crazy  . Paxil [Paroxetine] Other (See Comments)    Made pt feel crazy   . Prozac [Fluoxetine Hcl] Other (See Comments)    Pt felt crazy   . Sertraline Hcl   . Solifenacin Other (See Comments)    Ineffective   . Sulfa Antibiotics Other (See Comments)    unknown  . Wellbutrin [Bupropion Hcl] Other  (See Comments)    unknown     There is no immunization history on file for this patient.  Past Medical History:  Diagnosis Date  . Anxiety   . Arrhythmia    right bundle branch block  . Bipolar 1 disorder ([place])   . Cancer ([place])   . Hyperlipidemia   . Hypertension   . Morbid obesity ([place])   . Osteoarthritis   . Schizo-affective schizophrenia ([place])   . Thyroid disease    hypothyroidism    Tobacco History: Social History   Tobacco Use  Smoking Status Former Smoker  . Last attempt to quit: 12/08/1978  . Years since quitting: 39.5  Smokeless Tobacco Never Used   Counseling given: Not Answered   Outpatient Medications Prior to Visit  Medication Sig Dispense Refill  . Acetaminophen (TYLENOL EXTRA STRENGTH PO) Take by mouth 2 (two) times daily.    Marland Kitchen ALPRAZolam (XANAX) 0.5 MG tablet Take 0.5-2 mg by mouth 4 (four) times daily as needed for anxiety. Patient may take 3-4 tablets by mouth as needed at bedtime for sleep and 1-3 tablets by mouth three times daily as needed for anxiety    . Calcium Carbonate (CALCIUM 600 PO) Take 1 tablet by mouth daily.    Marland Kitchen co-enzyme Q-10 30 MG capsule Take 30 mg by mouth daily.    Marland Kitchen donepezil (ARICEPT) 10 MG tablet Take 10 mg by mouth every morning.     Marland Kitchen  fenofibrate (TRICOR) 145 MG tablet TAKE 1 TABLET EVERY TUESDAY,THURSDAY,AND SATURDAY AT 6 PM 15 tablet 5  . HYDROcodone-acetaminophen (NORCO/VICODIN) 5-325 MG tablet Take 1 tablet by mouth daily as needed for pain.    Marland Kitchen lamoTRIgine (LAMICTAL) 150 MG tablet Take 150 mg by mouth 2 (two) times daily.  12  . memantine (NAMENDA) 10 MG tablet Take 10 mg by mouth 2 (two) times daily.      . metoprolol tartrate (LOPRESSOR) 25 MG tablet Take 1 tablet (25 mg total) by mouth 2 (two) times daily. 90 tablet 3  . Multiple Vitamins-Minerals (MULTIVITAMIN ADULT PO) Take 1 tablet by mouth daily.    Marland Kitchen OLANZapine (ZYPREXA) 15 MG tablet Take 30 mg by mouth at bedtime.      Marland Kitchen QUEtiapine (SEROQUEL) 25 MG tablet Take 50  mg by mouth at bedtime.     . ramelteon (ROZEREM) 8 MG tablet Take 8 mg by mouth at bedtime.      Marland Kitchen spironolactone (ALDACTONE) 25 MG tablet TAKE 1 TABLET BY MOUTH DAILY 30 tablet 11  . SYNTHROID 150 MCG tablet TAKE 1 TABLET (150 MCG TOTAL) BY MOUTH DAILY. 30 tablet 6  . trolamine salicylate (ASPERCREME) 10 % cream Apply 1 application topically as needed for muscle pain.    Marland Kitchen aspirin 81 MG tablet Take 81 mg by mouth daily.    . Lactobacillus (PROBIOTIC ACIDOPHILUS PO) Take 1 capsule by mouth daily.    Marland Kitchen albuterol (PROVENTIL HFA;VENTOLIN HFA) 108 (90 Base) MCG/ACT inhaler Inhale 2 puffs into the lungs every 6 (six) hours as needed for wheezing or shortness of breath.      No facility-administered medications prior to visit.     Review of Systems  Review of Systems  Constitutional: Negative.   HENT: Negative.   Respiratory: Negative.   Cardiovascular: Negative.   Musculoskeletal: Positive for back pain.       Knee and back pain, chronic     Physical Exam  BP 134/76 (BP Location: Left Arm, Cuff Size: Normal)   Pulse 97   SpO2 98%  Physical Exam  Constitutional: She is oriented to person, place, and time. She appears well-developed and well-nourished.  HENT:  Head: Normocephalic and atraumatic.  Mouth/Throat: Uvula is midline, oropharynx is clear and moist and mucous membranes are normal.  Eyes: Pupils are equal, round, and reactive to light. EOM are normal.  Neck: Normal range of motion. Neck supple.  Cardiovascular: Normal rate and regular rhythm.  Pulmonary/Chest: Effort normal and breath sounds normal.  LSC, diminished right lower lobe. No resp distress. 97% RA   Abdominal: Soft.  Musculoskeletal:  Amb with rolling walker   Neurological: She is alert and oriented to person, place, and time.  Skin: Skin is warm and dry.  Psychiatric: She has a normal mood and affect. Her behavior is normal. Judgment and thought content normal.     Lab Results:  CBC    Component Value  Date/Time   WBC 6.3 06/29/2018 0236   RBC 4.22 06/29/2018 0236   HGB 13.1 06/29/2018 0236   HGB 13.1 05/11/2014 0910   HCT 40.7 06/29/2018 0236   HCT 39.1 05/11/2014 0910   PLT 278 06/29/2018 0236   PLT 250 05/11/2014 0910   MCV 96.4 06/29/2018 0236   MCV 91.5 05/11/2014 0910   MCH 31.0 06/29/2018 0236   MCHC 32.2 06/29/2018 0236   RDW 13.8 06/29/2018 0236   RDW 14.0 05/11/2014 0910   LYMPHSABS 2.4 05/11/2014 0910   MONOABS 0.5  05/11/2014 0910   EOSABS 0.1 05/11/2014 0910   BASOSABS 0.0 05/11/2014 0910    BMET    Component Value Date/Time   NA 142 06/29/2018 0236   NA 142 05/11/2014 0911   K 3.6 06/29/2018 0236   K 4.1 05/11/2014 0911   CL 107 06/29/2018 0236   CL 105 05/12/2013 1452   CO2 28 06/29/2018 0236   CO2 19 (L) 05/11/2014 0911   GLUCOSE 129 (H) 06/29/2018 0236   GLUCOSE 106 05/11/2014 0911   GLUCOSE 100 (H) 05/12/2013 1452   BUN 14 06/29/2018 0236   BUN 14.9 05/11/2014 0911   CREATININE 0.92 06/29/2018 0236   CREATININE 0.89 10/10/2015 0933   CREATININE 0.9 05/11/2014 0911   CALCIUM 9.3 06/29/2018 0236   CALCIUM 9.9 05/11/2014 0911   GFRNONAA 58 (L) 06/29/2018 0236   GFRAA >60 06/29/2018 0236    BNP No results found for: BNP  ProBNP No results found for: PROBNP  Imaging: Dg Chest 1 View  Result Date: 06/28/2018 CLINICAL DATA:  Status post right thoracentesis EXAM: CHEST  1 VIEW COMPARISON:  Chest radiograph from earlier today. FINDINGS: Stable cardiomediastinal silhouette with top-normal heart size. No pneumothorax. Small right pleural effusion is decreased. No left pleural effusion. No pulmonary edema. Improved aeration at the right lung base with decreased patchy right lung base opacity. IMPRESSION: 1. No right pneumothorax. Small right pleural effusion is decreased. 2. Decreased patchy right lung base opacity. Chest radiograph follow-up to resolution recommended. Electronically Signed   By: Ilona Sorrel M.D.   On: 06/28/2018 11:45   Dg Chest 2  View  Result Date: 07/02/2018 CLINICAL DATA:  Follow-up right pleural effusion EXAM: CHEST - 2 VIEW COMPARISON:  06/28/2018 FINDINGS: Cardiac shadow remains enlarged. Left lung is clear. Persistent small effusion on the right with associated atelectasis is noted. This is stable from the recent CT examination. No new focal abnormality is noted. IMPRESSION: Stable right effusion with associated atelectasis. Electronically Signed   By: Inez Catalina M.D.   On: 07/02/2018 10:13   Dg Chest 2 View  Result Date: 06/26/2018 CLINICAL DATA:  Severe viral infection.  Progressive short of breath EXAM: CHEST - 2 VIEW COMPARISON:  05/23/2018 FINDINGS: Normal cardiac silhouette. Large RIGHT pleural effusion is increased significantly from comparison exam. No airspace disease. LEFT lung clear. No acute osseous abnormality. IMPRESSION: New large unilateral RIGHT pleural effusion. Electronically Signed   By: Suzy Bouchard M.D.   On: 06/26/2018 16:27   Ct Angio Chest Pe W Or Wo Contrast  Result Date: 06/28/2018 CLINICAL DATA:  Shortness of breath.  Recent thoracentesis EXAM: CT ANGIOGRAPHY CHEST WITH CONTRAST TECHNIQUE: Multidetector CT imaging of the chest was performed using the standard protocol during bolus administration of intravenous contrast. Multiplanar CT image reconstructions and MIPs were obtained to evaluate the vascular anatomy. CONTRAST:  25mL ISOVUE-370 IOPAMIDOL (ISOVUE-370) INJECTION 76% COMPARISON:  Chest x-ray 06/28/2018. FINDINGS: Cardiovascular: Cardiomegaly. No evidence of aortic aneurysm. Scattered aortic calcifications and coronary artery calcifications, most pronounced in the left anterior descending coronary artery. No filling defects in the pulmonary arteries to suggest pulmonary emboli. Mediastinum/Nodes: No mediastinal, hilar, or axillary adenopathy. Lungs/Pleura: Small bilateral pleural effusions, right greater than left. Compressive atelectasis in the right lower lobe. Patchy ground-glass  airspace opacities in both lungs, most pronounced in the right lower lobe and right upper lobe. This could reflect early edema, but is concerning for infection/pneumonia. Upper Abdomen: Moderate perihepatic ascites. Musculoskeletal: Chest wall soft tissues are unremarkable. No acute bony abnormality.  Review of the MIP images confirms the above findings. IMPRESSION: Small bilateral pleural effusions, right greater than left. Patchy ground-glass airspace opacities in both lungs, most notable in the right lower lobe and right upper lobe concerning for multifocal pneumonia. Early edema could have a similar appearance. No evidence of pulmonary embolus. Coronary artery disease.  Cardiomegaly. Electronically Signed   By: Rolm Baptise M.D.   On: 06/28/2018 17:37   Dg Chest Portable 1 View  Result Date: 06/28/2018 CLINICAL DATA:  Short of breath EXAM: PORTABLE CHEST 1 VIEW COMPARISON:  06/26/2018 FINDINGS: Right pleural effusion and right lower lobe airspace disease unchanged. Left lung remains clear. Cardiac enlargement without heart failure. IMPRESSION: Right pleural effusion and right lower lobe airspace disease unchanged. No new findings Electronically Signed   By: Franchot Gallo M.D.   On: 06/28/2018 08:12   Ir Thoracentesis Asp Pleural Space W/img Guide  Result Date: 06/28/2018 INDICATION: Worsening SHOB x 3 weeks; presented to ED for same 7/20 and 7/22. Right pleural effusion noted on CXR from 7/20 and 7/22; previous CXR on 6/16 without pleural effusion noted. Request for diagnostic and therapeutic thoracentesis today. EXAM: ULTRASOUND GUIDED RIGHT THORACENTESIS MEDICATIONS: 10 mL 2% lidocaine. COMPLICATIONS: None immediate. PROCEDURE: An ultrasound guided thoracentesis was thoroughly discussed with the patient and questions answered. The benefits, risks, alternatives and complications were also discussed. The patient understands and wishes to proceed with the procedure. Written consent was obtained.  Ultrasound was performed to localize and mark an adequate pocket of fluid in the right chest. The area was then prepped and draped in the normal sterile fashion. 2% Lidocaine was used for local anesthesia. Under ultrasound guidance a 6 Fr Safe-T-Centesis catheter was introduced. Thoracentesis was performed. The catheter was removed and a dressing applied. FINDINGS: A total of approximately 1.5L of milky blood tinged fluid was removed. Samples were sent to the laboratory as requested by the clinical team. IMPRESSION: Successful ultrasound guided right thoracentesis yielding 1.5L of pleural fluid. Read by Candiss Norse, PA-C Electronically Signed   By: Markus Daft M.D.   On: 06/28/2018 12:32     Assessment & Plan:   Pleural effusion on right [facility] stay 7/22-7/23 for shortness of breath, found to have moderate right pleural effusion.  Status post thoracentesis, 1-1/2 L drained. Dx chylothorax. - Repeat chest x-ray showing stable small right pleural effusion - Plan to repeat chest x-ray in 3 to 4 weeks - Check basic labs in 1 week - Needs a follow up with Dr. Margart Sickles in [place], NP 07/02/2018

## 2018-07-02 NOTE — Assessment & Plan Note (Addendum)
-   Hospital stay 7/22-7/23 for shortness of breath, found to have moderate right pleural effusion.  Status post thoracentesis, 1-1/2 L drained. Dx chylothorax. - Repeat chest x-ray showing stable small right pleural effusion - Plan to repeat chest x-ray in 3 to 4 weeks - Check basic labs in 1 week - Needs a follow up with Dr. Margart Sickles in [place]

## 2018-07-02 NOTE — Telephone Encounter (Signed)
ATC pt, no answer. Left message for pt to call back.  

## 2018-07-02 NOTE — Telephone Encounter (Signed)
Pt aware to come by the office(lab) and have lab work completed. Nothing more needed at this time.

## 2018-07-04 ENCOUNTER — Emergency Department (HOSPITAL_COMMUNITY): Payer: Medicare HMO

## 2018-07-04 ENCOUNTER — Encounter (HOSPITAL_COMMUNITY): Payer: Self-pay | Admitting: Emergency Medicine

## 2018-07-04 ENCOUNTER — Emergency Department (HOSPITAL_COMMUNITY)
Admission: EM | Admit: 2018-07-04 | Discharge: 2018-07-04 | Disposition: A | Discharge status: Home / Self Care | Payer: Medicare HMO | Attending: Emergency Medicine | Admitting: Emergency Medicine

## 2018-07-04 ENCOUNTER — Other Ambulatory Visit: Payer: Self-pay

## 2018-07-04 DIAGNOSIS — J90 Pleural effusion, not elsewhere classified: Secondary | ICD-10-CM | POA: Diagnosis not present

## 2018-07-04 DIAGNOSIS — Z79899 Other long term (current) drug therapy: Secondary | ICD-10-CM | POA: Diagnosis not present

## 2018-07-04 DIAGNOSIS — I10 Essential (primary) hypertension: Secondary | ICD-10-CM | POA: Diagnosis not present

## 2018-07-04 DIAGNOSIS — Z87891 Personal history of nicotine dependence: Secondary | ICD-10-CM | POA: Insufficient documentation

## 2018-07-04 DIAGNOSIS — E039 Hypothyroidism, unspecified: Secondary | ICD-10-CM | POA: Insufficient documentation

## 2018-07-04 DIAGNOSIS — Z859 Personal history of malignant neoplasm, unspecified: Secondary | ICD-10-CM | POA: Insufficient documentation

## 2018-07-04 DIAGNOSIS — R0602 Shortness of breath: Secondary | ICD-10-CM | POA: Diagnosis not present

## 2018-07-04 NOTE — ED Notes (Signed)
ambulated pt with continuous pulse oximetry - o2 sats remain 97% during ambulation; resp even, nonlabored; able to speak in complete sentences

## 2018-07-04 NOTE — ED Triage Notes (Signed)
Pt. 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 This episode started this morning at 0300.

## 2018-07-04 NOTE — ED Triage Notes (Signed)
Pt. Stated I was discharged on Tuesday and had fluid drawn off while here.

## 2018-07-04 NOTE — ED Provider Notes (Signed)
[facility] EMERGENCY DEPARTMENT Provider Note   CSN: [id number] Arrival date & time: 07/04/18  9983     History   Chief Complaint Chief Complaint  Patient presents with  . Shortness of Breath    HPI [name] is a 77 y.o. female.  Pt presents to the ED today with sob.  She has a hx of a laparoscopic Heller's myotomy with partial fundoplication on 02/13/24.  She has had a difficult recovery.  She presented to the ED on 7/22 with sob.  CXR showed right sided pleural effusion.  1.5L of pleural fluid was drained which was + for chylothorax.  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  The pt followed up with pulmonology on 7/26 and had been doing well.  The pt woke up this am around 0300 with sob.  She is worried she has fluid on her lungs again.  She denies any pain or n/v or f/c.      Past Medical History:  Diagnosis Date  . Anxiety   . Arrhythmia    right bundle branch block  . Bipolar 1 disorder ([place])   . Cancer ([place])   . Hyperlipidemia   . Hypertension   . Morbid obesity ([place])   . Osteoarthritis   . Schizo-affective schizophrenia ([place])   . Thyroid disease    hypothyroidism    Patient Active Problem List   Diagnosis Date Noted  . Pleural effusion on right 06/28/2018  . Osteoarthritis of left knee 11/09/2014  . Nocturia more than twice per night 11/09/2014  . Right bundle branch block 02/23/2013  . Irritable bowel syndrome with constipation and diarrhea 10/28/2012  . Breast cancer ([place]) 04/07/2012  . Dermatitis 12/24/2011  . Hypercholesterolemia 04/30/2011  . Hypothyroidism 04/30/2011  . Dementia 04/30/2011  . Benign hypertensive heart disease without heart failure 04/30/2011    Past Surgical History:  Procedure Laterality Date  . BREAST SURGERY    . CHOLECYSTECTOMY    . DILATION AND CURETTAGE OF UTERUS    . ESOPHAGEAL MANOMETRY N/A 01/29/2018   Procedure: ESOPHAGEAL MANOMETRY  (EM);  Surgeon: Wonda Horner, MD;  Location: [facility];  Service: Endoscopy;  Laterality: N/A;  . HAND SURGERY     Right-trigger finger  . IR THORACENTESIS ASP PLEURAL SPACE W/IMG GUIDE  06/28/2018  . KNEE SURGERY     Left  . TONSILLECTOMY       OB History   None      Home Medications    Prior to Admission medications   Medication Sig Start Date End Date Taking? Authorizing Provider  Acetaminophen (TYLENOL EXTRA STRENGTH PO) Take by mouth 2 (two) times daily.    [provider]  albuterol (PROVENTIL HFA;VENTOLIN HFA) 108 (90 Base) MCG/ACT inhaler Inhale 2 puffs into the lungs every 6 (six) hours as needed for wheezing or shortness of breath.     [provider]  ALPRAZolam Duanne Moron) 0.5 MG tablet Take 0.5-2 mg by mouth 4 (four) times daily as needed for anxiety. Patient may take 3-4 tablets by mouth as needed at bedtime for sleep and 1-3 tablets by mouth three times daily as needed for anxiety    [provider]  Calcium Carbonate (CALCIUM 600 PO) Take 1 tablet by mouth daily.    [provider]  co-enzyme Q-10 30 MG capsule Take 30 mg by mouth daily.    [provider]  donepezil (ARICEPT) 10 MG tablet Take 10 mg by mouth every morning.     [provider]  fenofibrate (TRICOR) 145 MG tablet TAKE 1 TABLET EVERY TUESDAY,THURSDAY,AND SATURDAY AT 6 PM 04/20/18   Skeet Latch, MD  HYDROcodone-acetaminophen (NORCO/VICODIN) 5-325 MG tablet Take 1 tablet by mouth daily as needed for pain.    [provider]  lamoTRIgine (LAMICTAL) 150 MG tablet Take 150 mg by mouth 2 (two) times daily. 04/06/16   [provider]  memantine (NAMENDA) 10 MG tablet Take 10 mg by mouth 2 (two) times daily.      [provider]  metoprolol tartrate (LOPRESSOR) 25 MG tablet Take 1 tablet (25 mg total) by mouth 2 (two) times daily. 03/18/18   Almyra Deforest, PA  Multiple Vitamins-Minerals (MULTIVITAMIN ADULT PO) Take 1 tablet by mouth  daily.    [provider]  OLANZapine (ZYPREXA) 15 MG tablet Take 30 mg by mouth at bedtime.      [provider]  QUEtiapine (SEROQUEL) 25 MG tablet Take 50 mg by mouth at bedtime.     [provider]  ramelteon (ROZEREM) 8 MG tablet Take 8 mg by mouth at bedtime.      [provider]  spironolactone (ALDACTONE) 25 MG tablet TAKE 1 TABLET BY MOUTH DAILY 03/29/18   Burtis Junes, NP  SYNTHROID 150 MCG tablet TAKE 1 TABLET (150 MCG TOTAL) BY MOUTH DAILY. 10/23/15   Darlin Coco, MD  trolamine salicylate (ASPERCREME) 10 % cream Apply 1 application topically as needed for muscle pain.    [provider]    Family History Family History  Problem Relation Age of Onset  . Hypertension Mother     Social History Social History   Tobacco Use  . Smoking status: Former Smoker    Last attempt to quit: 12/08/1978    Years since quitting: 39.5  . Smokeless tobacco: Never Used  Substance Use Topics  . Alcohol use: Yes    Comment: 1 time a year  . Drug use: No     Allergies   Lithium; Fluoxetine; Macrodantin; Mirabegron; Paroxetine hcl; Paxil [paroxetine]; Prozac [fluoxetine hcl]; Sertraline hcl; Solifenacin; Sulfa antibiotics; and Wellbutrin [bupropion hcl]   Review of Systems Review of Systems  Respiratory: Positive for shortness of breath.   All other systems reviewed and are negative.    Physical Exam Updated Vital Signs BP (!) 140/51 (BP Location: Right Arm)   Pulse 67   Temp 99 F (37.2 C) (Oral)   Resp 20   SpO2 97%   Physical Exam  Constitutional: She is oriented to person, place, and time. She appears well-developed and well-nourished.  HENT:  Head: Normocephalic and atraumatic.  Mouth/Throat: Oropharynx is clear and moist.  Eyes: Pupils are equal, round, and reactive to light. EOM are normal.  Neck: Normal range of motion. Neck supple.  Cardiovascular: Normal rate and regular rhythm.  Pulmonary/Chest: Breath sounds  normal. Tachypnea noted.  Abdominal: Soft. Bowel sounds are normal.  Musculoskeletal: Normal range of motion.       Right lower leg: Normal.       Left lower leg: Normal.  Neurological: She is oriented to person, place, and time.  Skin: Skin is warm and dry. Capillary refill takes less than 2 seconds.  Psychiatric: She has a normal mood and affect. Her behavior is normal.  Nursing note and vitals reviewed.    ED Treatments / Results  Labs (all labs ordered are listed, but only abnormal results  are displayed) Labs Reviewed - No data to display  EKG EKG Interpretation  Date/Time:  Sunday July 04 2018 07:04:42 EDT Ventricular Rate:  66 PR Interval:  162 QRS Duration: 128 QT Interval:  430 QTC Calculation: 450 R Axis:     Text Interpretation:  Normal sinus rhythm Right bundle branch block Left ventricular hypertrophy with repolarization abnormality Abnormal ECG No significant change since last tracing Confirmed by Isla Pence [phone]) on 07/04/2018 7:40:42 AM   Radiology Dg Chest 2 View  Result Date: 07/04/2018 CLINICAL DATA:  Shortness of breath. History of right pleural effusion and status post right thoracentesis procedure on 06/28/2018 EXAM: CHEST - 2 VIEW COMPARISON:  07/02/2018 FINDINGS: The heart size and mediastinal contours are within normal limits. There has been some reaccumulation of right pleural fluid with likely moderate volume right pleural effusion present. There is associated atelectasis and consolidation of the right lower lobe. Underlying pneumonia or mass is not excluded. No pneumothorax. No pulmonary edema. No left pleural fluid. The visualized skeletal structures are unremarkable. IMPRESSION: Some reaccumulation of right pleural fluid present after recent thoracentesis procedure. Moderate right pleural fluid volume present. Associated atelectasis and consolidation of the right lower lobe. Underlying pneumonia or mass is not excluded. Electronically Signed   By:  Aletta Edouard M.D.   On: 07/04/2018 08:32   Dg Chest 2 View  Result Date: 07/02/2018 CLINICAL DATA:  Follow-up right pleural effusion EXAM: CHEST - 2 VIEW COMPARISON:  06/28/2018 FINDINGS: Cardiac shadow remains enlarged. Left lung is clear. Persistent small effusion on the right with associated atelectasis is noted. This is stable from the recent CT examination. No new focal abnormality is noted. IMPRESSION: Stable right effusion with associated atelectasis. Electronically Signed   By: Inez Catalina M.D.   On: 07/02/2018 10:13    Procedures Procedures (including critical care time)  Medications Ordered in ED Medications - No data to display   Initial Impression / Assessment and Plan / ED Course  I have reviewed the triage vital signs and the nursing notes.  Pertinent labs & imaging results that were available during my care of the patient were reviewed by me and considered in my medical decision making (see chart for details).     Pt is able to ambulate with her normal walker without any difficulty with sob.  As she is oxygenating well, I will not recommend thoracentesis at this point.  She is told she may need one in the future.  She has an appt with pulmonology in 3 weeks for repeat cxr.  She is told to return here if breathing worsens.  She is happy with this plan.  Final Clinical Impressions(s) / ED Diagnoses   Final diagnoses:  Pleural effusion    ED Discharge Orders    None       Isla Pence, MD 07/04/18 [phone]

## 2018-07-04 NOTE — ED Notes (Signed)
Transported to radiology via stretcher per rad tech  

## 2018-07-05 DIAGNOSIS — R2681 Unsteadiness on feet: Secondary | ICD-10-CM | POA: Diagnosis not present

## 2018-07-05 DIAGNOSIS — M25562 Pain in left knee: Secondary | ICD-10-CM | POA: Diagnosis not present

## 2018-07-05 DIAGNOSIS — M545 Low back pain: Secondary | ICD-10-CM | POA: Diagnosis not present

## 2018-07-05 DIAGNOSIS — M6281 Muscle weakness (generalized): Secondary | ICD-10-CM | POA: Diagnosis not present

## 2018-07-05 NOTE — Telephone Encounter (Signed)
LVM for Maurine Minister coordinator who works with Dr. Margart Sickles [phone]. Trying to get a hold of Almyra Free regarding SG message below with update on pt. Will try again later in the week.

## 2018-07-05 NOTE — Progress Notes (Signed)
Reviewed and agree with assessment/plan.   Anayansi Rundquist, MD [facility] 12/03/2016, 12:24 PM Pager:  [phone]  

## 2018-07-06 DIAGNOSIS — I10 Essential (primary) hypertension: Secondary | ICD-10-CM | POA: Diagnosis not present

## 2018-07-06 DIAGNOSIS — Z6831 Body mass index (BMI) 31.0-31.9, adult: Secondary | ICD-10-CM | POA: Diagnosis not present

## 2018-07-06 DIAGNOSIS — J90 Pleural effusion, not elsewhere classified: Secondary | ICD-10-CM | POA: Diagnosis not present

## 2018-07-06 DIAGNOSIS — E785 Hyperlipidemia, unspecified: Secondary | ICD-10-CM | POA: Diagnosis not present

## 2018-07-06 DIAGNOSIS — K220 Achalasia of cardia: Secondary | ICD-10-CM | POA: Diagnosis not present

## 2018-07-06 DIAGNOSIS — J9811 Atelectasis: Secondary | ICD-10-CM | POA: Diagnosis not present

## 2018-07-06 DIAGNOSIS — E039 Hypothyroidism, unspecified: Secondary | ICD-10-CM | POA: Diagnosis not present

## 2018-07-06 DIAGNOSIS — Z79899 Other long term (current) drug therapy: Secondary | ICD-10-CM | POA: Diagnosis not present

## 2018-07-06 NOTE — Telephone Encounter (Signed)
[name] is returning call. Cb is [phone].

## 2018-07-06 NOTE — Telephone Encounter (Signed)
Almyra Free called for SG today regarding pt's hospitalization and OV with Dr. Ottis Stain today. Pt went to ED on Sunday 07/04/18, had additional cxr- reviewed with no changes from other cxr.  appt with Dr. Margart Sickles MD today at 2pm in [facility] for update follow up Pt is not feeling better, having increase SOB, Wheezing, and chest tightness with pain. Almyra Free will call SG back after appt today will more of pt's update and treatment plan.  Routing to SG for review.

## 2018-07-06 NOTE — Telephone Encounter (Signed)
Attempted to call Almyra Free, RN.  Left message on VM to call [facility] office, when available.

## 2018-07-06 NOTE — Telephone Encounter (Signed)
Thanks so much [place].

## 2018-07-08 ENCOUNTER — Telehealth: Payer: Self-pay | Admitting: Primary Care

## 2018-07-08 ENCOUNTER — Other Ambulatory Visit (INDEPENDENT_AMBULATORY_CARE_PROVIDER_SITE_OTHER): Payer: Medicare HMO

## 2018-07-08 DIAGNOSIS — J90 Pleural effusion, not elsewhere classified: Secondary | ICD-10-CM

## 2018-07-08 LAB — CBC
HEMATOCRIT: 42.4 % (ref 36.0–46.0)
HEMOGLOBIN: 14.1 g/dL (ref 12.0–15.0)
MCHC: 33.2 g/dL (ref 30.0–36.0)
MCV: 94.2 fl (ref 78.0–100.0)
PLATELETS: 313 10*3/uL (ref 150.0–400.0)
RBC: 4.5 Mil/uL (ref 3.87–5.11)
RDW: 14.3 % (ref 11.5–15.5)
WBC: 5.9 10*3/uL (ref 4.0–10.5)

## 2018-07-08 LAB — BASIC METABOLIC PANEL
BUN: 11 mg/dL (ref 6–23)
CALCIUM: 10 mg/dL (ref 8.4–10.5)
CHLORIDE: 104 meq/L (ref 96–112)
CO2: 25 mEq/L (ref 19–32)
CREATININE: 1.09 mg/dL (ref 0.40–1.20)
GFR: 51.56 mL/min — ABNORMAL LOW (ref >60.00)
Glucose, Bld: 129 mg/dL — ABNORMAL HIGH (ref 70–99)
Potassium: 4.1 mEq/L (ref 3.5–5.1)
Sodium: 140 mEq/L (ref 135–145)

## 2018-07-08 NOTE — Telephone Encounter (Signed)
ATC x 2 line busy  Will be happy to fax labs once they have been resulted on  [facility]

## 2018-07-08 NOTE — Telephone Encounter (Signed)
Faxed lab results to [place] today at fax [phone] to Dr. Lazarus Salines office per pt request Attempted to call patient today regarding this fax has been sent and confirmed rec'd. I did not receive an answer at time of call. I have left a voicemail message for pt to return call. X3 Per triage protocol, tried calling patient several times no response, closing message today.

## 2018-07-08 NOTE — Telephone Encounter (Signed)
ATC again, NA and no option to leave VM

## 2018-07-09 DIAGNOSIS — M6281 Muscle weakness (generalized): Secondary | ICD-10-CM | POA: Diagnosis not present

## 2018-07-09 DIAGNOSIS — M545 Low back pain: Secondary | ICD-10-CM | POA: Diagnosis not present

## 2018-07-09 DIAGNOSIS — R2681 Unsteadiness on feet: Secondary | ICD-10-CM | POA: Diagnosis not present

## 2018-07-09 DIAGNOSIS — M25562 Pain in left knee: Secondary | ICD-10-CM | POA: Diagnosis not present

## 2018-07-12 DIAGNOSIS — M545 Low back pain: Secondary | ICD-10-CM | POA: Diagnosis not present

## 2018-07-12 DIAGNOSIS — R2681 Unsteadiness on feet: Secondary | ICD-10-CM | POA: Diagnosis not present

## 2018-07-12 DIAGNOSIS — M6281 Muscle weakness (generalized): Secondary | ICD-10-CM | POA: Diagnosis not present

## 2018-07-12 DIAGNOSIS — M25562 Pain in left knee: Secondary | ICD-10-CM | POA: Diagnosis not present

## 2018-07-13 DIAGNOSIS — J90 Pleural effusion, not elsewhere classified: Secondary | ICD-10-CM | POA: Diagnosis not present

## 2018-07-13 DIAGNOSIS — J9811 Atelectasis: Secondary | ICD-10-CM | POA: Diagnosis not present

## 2018-07-16 DIAGNOSIS — M6281 Muscle weakness (generalized): Secondary | ICD-10-CM | POA: Diagnosis not present

## 2018-07-16 DIAGNOSIS — R2681 Unsteadiness on feet: Secondary | ICD-10-CM | POA: Diagnosis not present

## 2018-07-16 DIAGNOSIS — M25562 Pain in left knee: Secondary | ICD-10-CM | POA: Diagnosis not present

## 2018-07-16 DIAGNOSIS — M545 Low back pain: Secondary | ICD-10-CM | POA: Diagnosis not present

## 2018-07-19 ENCOUNTER — Other Ambulatory Visit: Payer: Self-pay | Admitting: Cardiology

## 2018-07-19 ENCOUNTER — Other Ambulatory Visit (HOSPITAL_COMMUNITY): Payer: Self-pay | Admitting: Cardiology

## 2018-07-19 ENCOUNTER — Ambulatory Visit (HOSPITAL_COMMUNITY)
Admission: RE | Admit: 2018-07-19 | Discharge: 2018-07-19 | Disposition: A | Discharge status: Home / Self Care | Payer: Medicare HMO | Source: Ambulatory Visit | Attending: Student | Admitting: Student

## 2018-07-19 ENCOUNTER — Other Ambulatory Visit (HOSPITAL_COMMUNITY): Payer: Self-pay | Admitting: Student

## 2018-07-19 ENCOUNTER — Ambulatory Visit (INDEPENDENT_AMBULATORY_CARE_PROVIDER_SITE_OTHER): Payer: Medicare HMO | Admitting: Cardiology

## 2018-07-19 ENCOUNTER — Encounter: Payer: Self-pay | Admitting: Cardiology

## 2018-07-19 ENCOUNTER — Ambulatory Visit (HOSPITAL_COMMUNITY)
Admission: RE | Admit: 2018-07-19 | Discharge: 2018-07-19 | Disposition: A | Discharge status: Home / Self Care | Payer: Medicare HMO | Source: Ambulatory Visit | Attending: Cardiology | Admitting: Cardiology

## 2018-07-19 VITALS — BP 128/72 | HR 83 | Ht 62.0 in | Wt 172.8 lb

## 2018-07-19 DIAGNOSIS — I119 Hypertensive heart disease without heart failure: Secondary | ICD-10-CM | POA: Diagnosis not present

## 2018-07-19 DIAGNOSIS — J90 Pleural effusion, not elsewhere classified: Secondary | ICD-10-CM

## 2018-07-19 DIAGNOSIS — Z9889 Other specified postprocedural states: Secondary | ICD-10-CM | POA: Diagnosis not present

## 2018-07-19 HISTORY — PX: IR THORACENTESIS ASP PLEURAL SPACE W/IMG GUIDE: IMG5380

## 2018-07-19 MED ORDER — LIDOCAINE HCL (PF) 2 % IJ SOLN
INTRAMUSCULAR | Status: AC
  Filled 2018-07-19: qty 20

## 2018-07-19 MED ORDER — LIDOCAINE HCL (PF) 2 % IJ SOLN
INTRAMUSCULAR | Status: DC | PRN
  Administered 2018-07-19: 10 mL

## 2018-07-19 NOTE — Progress Notes (Addendum)
07/19/2018 [name]   Oct 27, 1941  [id number]  Primary Physician [name], MD Primary Cardiologist: Dr Oval Linsey  HPI:  77 y/o female followed by Dr Oval Linsey with a history of hypertension, hyperlipidemia, chronic shortness of breath, prior breast  Cancer, and schizo-affective disorder and bipolar disorder. She lives at [employer]. She has several friends there and is very happy.  She was noted to have a murmur in the past and was referred for an echo that revealed LVEF 65-70% with grade 1 diastolic dysfunction.  She was noted to have a narrow LVOT but no outflow track gradient.    She recently had surgery at [facility]. Dr Margart Sickles performed a laparoscopic Heller myotomy. 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 She has since followed up with Dr Margart Sickles. She has had some recurrent effusion noted on CXR. The plan has been for observation for now but he was to see her back in follow up. E felt she would need a thoracic surgery consult if she had more problems.   Today she is in the office for routine follow up. She says she has had increasing SOB. Her O2 sat on RA is 90%. She has an effusion 3/4 up on Rt on exam.     Current Outpatient Medications  Medication Sig Dispense Refill  . Acetaminophen (TYLENOL EXTRA STRENGTH PO) Take by mouth 2 (two) times daily.    Marland Kitchen ALPRAZolam (XANAX) 0.5 MG tablet Take 0.5-2 mg by mouth 4 (four) times daily as needed for anxiety. Patient may take 3-4 tablets by mouth as needed at bedtime for sleep and 1-3 tablets by mouth three times daily as needed for anxiety    . Calcium Carbonate (CALCIUM 600 PO) Take 1 tablet by mouth daily.    Marland Kitchen co-enzyme Q-10 30 MG capsule Take 30 mg by mouth daily.    Marland Kitchen donepezil (ARICEPT) 10 MG tablet Take 10 mg by mouth every morning.     . fenofibrate (TRICOR) 145 MG tablet TAKE 1 TABLET EVERY TUESDAY,THURSDAY,AND SATURDAY AT 6 PM 15 tablet 5  . HYDROcodone-acetaminophen (NORCO/VICODIN) 5-325 MG  tablet Take 1 tablet by mouth daily as needed for pain.    Marland Kitchen lamoTRIgine (LAMICTAL) 150 MG tablet Take 150 mg by mouth 2 (two) times daily.  12  . memantine (NAMENDA) 10 MG tablet Take 10 mg by mouth 2 (two) times daily.      . metoprolol tartrate (LOPRESSOR) 25 MG tablet Take 1 tablet (25 mg total) by mouth 2 (two) times daily. 90 tablet 3  . Multiple Vitamins-Minerals (MULTIVITAMIN ADULT PO) Take 1 tablet by mouth daily.    Marland Kitchen OLANZapine (ZYPREXA) 15 MG tablet Take 30 mg by mouth at bedtime.      Marland Kitchen QUEtiapine (SEROQUEL) 25 MG tablet Take 50 mg by mouth at bedtime.     . ramelteon (ROZEREM) 8 MG tablet Take 8 mg by mouth at bedtime.      Marland Kitchen spironolactone (ALDACTONE) 25 MG tablet TAKE 1 TABLET BY MOUTH DAILY 30 tablet 11  . SYNTHROID 150 MCG tablet TAKE 1 TABLET (150 MCG TOTAL) BY MOUTH DAILY. 30 tablet 6  . trolamine salicylate (ASPERCREME) 10 % cream Apply 1 application topically as needed for muscle pain.     No current facility-administered medications for this visit.     Allergies  Allergen Reactions  . Lithium Nausea Only  . Fluoxetine Other (See Comments)    Pt felt crazy  . Macrodantin Other (See  Comments)    unknown  . Mirabegron Other (See Comments)    ineffective  . Paroxetine Hcl     Other reaction(s): Other (See Comments) Made pt feel crazy  . Paxil [Paroxetine] Other (See Comments)    Made pt feel crazy   . Prozac [Fluoxetine Hcl] Other (See Comments)    Pt felt crazy   . Sertraline Hcl   . Solifenacin Other (See Comments)    Ineffective   . Sulfa Antibiotics Other (See Comments)    unknown  . Wellbutrin [Bupropion Hcl] Other (See Comments)    unknown    Past Medical History:  Diagnosis Date  . Anxiety   . Arrhythmia    right bundle branch block  . Bipolar 1 disorder ([place])   . Cancer ([place])   . Hyperlipidemia   . Hypertension   . Morbid obesity ([place])   . Osteoarthritis   . Schizo-affective schizophrenia ([place])   . Thyroid disease    hypothyroidism     Social History   Socioeconomic History  . Marital status: Divorced    Spouse name: Not on file  . Number of children: Not on file  . Years of education: Not on file  . Highest education level: Not on file  Occupational History  . Not on file  Social Needs  . Financial resource strain: Not on file  . Food insecurity:    Worry: Not on file    Inability: Not on file  . Transportation needs:    Medical: Not on file    Non-medical: Not on file  Tobacco Use  . Smoking status: Former Smoker    Last attempt to quit: 12/08/1978    Years since quitting: 39.6  . Smokeless tobacco: Never Used  Substance and Sexual Activity  . Alcohol use: Yes    Comment: 1 time a year  . Drug use: No  . Sexual activity: Not on file  Lifestyle  . Physical activity:    Days per week: Not on file    Minutes per session: Not on file  . Stress: Not on file  Relationships  . Social connections:    Talks on phone: Not on file    Gets together: Not on file    Attends religious service: Not on file    Active member of club or organization: Not on file    Attends meetings of clubs or organizations: Not on file    Relationship status: Not on file  . Intimate partner violence:    Fear of current or ex partner: Not on file    Emotionally abused: Not on file    Physically abused: Not on file    Forced sexual activity: Not on file  Other Topics Concern  . Not on file  Social History Narrative  . Not on file     Family History  Problem Relation Age of Onset  . Hypertension Mother      Review of Systems: General: negative for chills, fever, night sweats or weight changes.  Cardiovascular: negative for chest pain, dyspnea on exertion, edema, orthopnea, palpitations, paroxysmal nocturnal dyspnea or shortness of breath Dermatological: negative for rash Respiratory: negative for cough or wheezing Urologic: negative for hematuria Abdominal: negative for nausea, vomiting, diarrhea, bright red blood  per rectum, melena, or hematemesis Neurologic: negative for visual changes, syncope, or dizziness All other systems reviewed and are otherwise negative except as noted above.    Blood pressure 128/72, pulse 83, height 5\' 2"  (1.575 m), weight  172 lb 12.8 oz (78.4 kg).  General appearance: alert, cooperative and mild distress Lungs: decreased breath sounds and dullness to percusion 3/4 up on Rt, clear on Lt  Heart: regular rate and rhythm and 2/6 systolic murmur Extremities: trace edema Skin: pale, cool, dry    ASSESSMENT AND PLAN:   Pleural effusion on right S/P thoracentesis 06/29/18, dx chylothorax Recurrent effusion today on exam- symptomatic  Benign hypertensive heart disease without heart failure Echo 06/28/18- EF 75-80%, no LVOT gradient   PLAN  Pt seen by Dr Stanford Breed and myself in the office today- we have spoken with IR and will send her to [facility] for thoracentesis.   Kerin Ransom PA-C 07/19/2018 11:59 AM   As above, patient seen and examined.  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  She has required previous thoracentesis.  She presents with worsening dyspnea.  No chest pain, fevers, chills, productive cough or hemoptysis.  On exam she has diminished breath sounds right lower to right mid lung field.  Previous echocardiogram shows normal LV function.  We will arrange repeat thoracentesis for symptomatic relief.  She will need close follow-up with her surgeon.  She will likely need chest tube with possible pleurodesis.  Kirk Ruths, MD

## 2018-07-19 NOTE — Assessment & Plan Note (Signed)
S/P thoracentesis 06/29/18, dx chylothorax Recurrent effusion today on exam- symptomatic

## 2018-07-19 NOTE — Assessment & Plan Note (Signed)
Echo 06/28/18- EF 75-80%, no LVOT gradient

## 2018-07-19 NOTE — Procedures (Signed)
PROCEDURE SUMMARY:  Successful image-guided right thoracentesis. Yielded 1.4 liters of blood-tinged chylous fluid. Patient tolerated procedure well. No immediate complications.  Specimen was not sent for labs. CXR ordered.  Alexandra Louk PA-C 07/19/2018 1:19 PM

## 2018-07-19 NOTE — Patient Instructions (Addendum)
Medication Instructions:  Your physician recommends that you continue on your current medications as directed. Please refer to the Current Medication list given to you today.  Labwork: None   Testing/Procedures: Thorocentisis- GO TO [place] ENTRANCE A RADIOLOGY IS ON THE FIRST FLOOR CHECK IN AT THE DESK  Follow-Up: Your physician recommends that you schedule a follow-up appointment in: 3 months [place] and and Dr Bishop Dublin  Any Other Special Instructions Will Be Listed Below (If Applicable). If you need a refill on your cardiac medications before your next appointment, please call your pharmacy.

## 2018-07-20 ENCOUNTER — Telehealth: Payer: Self-pay

## 2018-07-20 NOTE — Telephone Encounter (Signed)
-----   Message from Chesley Mires, MD sent at 07/20/2018  9:27 AM EDT ----- Vida Roller,  Can you make sure Ms. Chatwin has a follow up with me in next few weeks to assess status of pleural effusion.  Thanks.  V

## 2018-07-20 NOTE — Telephone Encounter (Signed)
Called and spoke with pt regarding VS message to check on PE in few weeks Pt advised she is having surgery with a thoracic surgeon with [facility] in 2 weeks. Advised VS with above info  Will follow up with pt in 2 weeks

## 2018-07-21 DIAGNOSIS — E43 Unspecified severe protein-calorie malnutrition: Secondary | ICD-10-CM | POA: Diagnosis not present

## 2018-07-21 DIAGNOSIS — I4510 Unspecified right bundle-branch block: Secondary | ICD-10-CM | POA: Diagnosis not present

## 2018-07-21 DIAGNOSIS — R918 Other nonspecific abnormal finding of lung field: Secondary | ICD-10-CM | POA: Diagnosis not present

## 2018-07-21 DIAGNOSIS — I898 Other specified noninfective disorders of lymphatic vessels and lymph nodes: Secondary | ICD-10-CM | POA: Diagnosis not present

## 2018-07-21 DIAGNOSIS — F319 Bipolar disorder, unspecified: Secondary | ICD-10-CM | POA: Diagnosis not present

## 2018-07-21 DIAGNOSIS — I10 Essential (primary) hypertension: Secondary | ICD-10-CM | POA: Diagnosis not present

## 2018-07-21 DIAGNOSIS — R590 Localized enlarged lymph nodes: Secondary | ICD-10-CM | POA: Diagnosis not present

## 2018-07-21 DIAGNOSIS — I420 Dilated cardiomyopathy: Secondary | ICD-10-CM | POA: Diagnosis not present

## 2018-07-21 DIAGNOSIS — R188 Other ascites: Secondary | ICD-10-CM | POA: Diagnosis not present

## 2018-07-21 DIAGNOSIS — G309 Alzheimer's disease, unspecified: Secondary | ICD-10-CM | POA: Diagnosis not present

## 2018-07-21 DIAGNOSIS — J939 Pneumothorax, unspecified: Secondary | ICD-10-CM | POA: Diagnosis not present

## 2018-07-21 DIAGNOSIS — J90 Pleural effusion, not elsewhere classified: Secondary | ICD-10-CM | POA: Diagnosis not present

## 2018-07-21 DIAGNOSIS — J9811 Atelectasis: Secondary | ICD-10-CM | POA: Diagnosis not present

## 2018-07-21 DIAGNOSIS — Z9049 Acquired absence of other specified parts of digestive tract: Secondary | ICD-10-CM | POA: Diagnosis not present

## 2018-07-21 DIAGNOSIS — J940 Chylous effusion: Secondary | ICD-10-CM | POA: Diagnosis not present

## 2018-07-21 DIAGNOSIS — R079 Chest pain, unspecified: Secondary | ICD-10-CM | POA: Diagnosis not present

## 2018-07-21 DIAGNOSIS — E039 Hypothyroidism, unspecified: Secondary | ICD-10-CM | POA: Diagnosis not present

## 2018-07-21 DIAGNOSIS — T85628A Displacement of other specified internal prosthetic devices, implants and grafts, initial encounter: Secondary | ICD-10-CM | POA: Diagnosis not present

## 2018-07-30 DIAGNOSIS — M6281 Muscle weakness (generalized): Secondary | ICD-10-CM | POA: Diagnosis not present

## 2018-07-30 DIAGNOSIS — R278 Other lack of coordination: Secondary | ICD-10-CM | POA: Diagnosis not present

## 2018-07-30 DIAGNOSIS — R2681 Unsteadiness on feet: Secondary | ICD-10-CM | POA: Diagnosis not present

## 2018-08-02 DIAGNOSIS — R278 Other lack of coordination: Secondary | ICD-10-CM | POA: Diagnosis not present

## 2018-08-02 DIAGNOSIS — M6281 Muscle weakness (generalized): Secondary | ICD-10-CM | POA: Diagnosis not present

## 2018-08-02 DIAGNOSIS — R2681 Unsteadiness on feet: Secondary | ICD-10-CM | POA: Diagnosis not present

## 2018-08-03 DIAGNOSIS — R278 Other lack of coordination: Secondary | ICD-10-CM | POA: Diagnosis not present

## 2018-08-03 DIAGNOSIS — R2681 Unsteadiness on feet: Secondary | ICD-10-CM | POA: Diagnosis not present

## 2018-08-03 DIAGNOSIS — J90 Pleural effusion, not elsewhere classified: Secondary | ICD-10-CM | POA: Diagnosis not present

## 2018-08-03 DIAGNOSIS — I10 Essential (primary) hypertension: Secondary | ICD-10-CM | POA: Diagnosis not present

## 2018-08-03 DIAGNOSIS — R1312 Dysphagia, oropharyngeal phase: Secondary | ICD-10-CM | POA: Diagnosis not present

## 2018-08-03 DIAGNOSIS — M6281 Muscle weakness (generalized): Secondary | ICD-10-CM | POA: Diagnosis not present

## 2018-08-03 DIAGNOSIS — R269 Unspecified abnormalities of gait and mobility: Secondary | ICD-10-CM | POA: Diagnosis not present

## 2018-08-03 NOTE — Telephone Encounter (Signed)
Tried to call patient, phone line was busy X2. Will try again later

## 2018-08-04 DIAGNOSIS — B0089 Other herpesviral infection: Secondary | ICD-10-CM | POA: Diagnosis not present

## 2018-08-05 DIAGNOSIS — R278 Other lack of coordination: Secondary | ICD-10-CM | POA: Diagnosis not present

## 2018-08-05 DIAGNOSIS — M6281 Muscle weakness (generalized): Secondary | ICD-10-CM | POA: Diagnosis not present

## 2018-08-05 DIAGNOSIS — R2681 Unsteadiness on feet: Secondary | ICD-10-CM | POA: Diagnosis not present

## 2018-08-05 NOTE — Telephone Encounter (Signed)
Tried to call patient, phone line was busy X3 Attempted to call patient today regarding results. We have left three messages for pt to call our office back regarding results. No response at this time, a letter has been placed in mail today for pt to call our office. Mailed letter of communication today. Nothing further needed at this time.

## 2018-08-10 DIAGNOSIS — M6281 Muscle weakness (generalized): Secondary | ICD-10-CM | POA: Diagnosis not present

## 2018-08-10 DIAGNOSIS — R2681 Unsteadiness on feet: Secondary | ICD-10-CM | POA: Diagnosis not present

## 2018-08-11 LAB — ACID FAST CULTURE WITH REFLEXED SENSITIVITIES (MYCOBACTERIA): Acid Fast Culture: NEGATIVE

## 2018-08-13 ENCOUNTER — Telehealth: Payer: Self-pay | Admitting: Primary Care

## 2018-08-13 DIAGNOSIS — R2681 Unsteadiness on feet: Secondary | ICD-10-CM | POA: Diagnosis not present

## 2018-08-13 DIAGNOSIS — M6281 Muscle weakness (generalized): Secondary | ICD-10-CM | POA: Diagnosis not present

## 2018-08-13 NOTE — Telephone Encounter (Signed)
Spoke with patient, informed patient that we tried calling several times to get her on the schedule for a follow up appt for her pleural effusion. Patient reports on 07/13/2018 she was hospitalized for the pleural effusion, a lot of fluid was pulled off, she was then started on a low fat diet which she feels has helped a lot. She saw her PCP last week and he listened to her lungs and they were clear. She goes back to [place] next week. Patient reports she is doing well and does not feel she needs to be seen at this time. Patient was instructed that at anytime she started having lung or breathing concerns and she felt she needed to be seen not to hesitate to call. Patient voiced understanding. Will route info to VS as FYI. Nothing further needed at this time.

## 2018-08-13 NOTE — Telephone Encounter (Signed)
She can continue to f/u at [facility].

## 2018-08-19 DIAGNOSIS — M25512 Pain in left shoulder: Secondary | ICD-10-CM | POA: Diagnosis not present

## 2018-08-19 DIAGNOSIS — M545 Low back pain: Secondary | ICD-10-CM | POA: Diagnosis not present

## 2018-08-19 DIAGNOSIS — M6281 Muscle weakness (generalized): Secondary | ICD-10-CM | POA: Diagnosis not present

## 2018-08-19 DIAGNOSIS — R2681 Unsteadiness on feet: Secondary | ICD-10-CM | POA: Diagnosis not present

## 2018-08-19 DIAGNOSIS — M1712 Unilateral primary osteoarthritis, left knee: Secondary | ICD-10-CM | POA: Diagnosis not present

## 2018-08-20 DIAGNOSIS — J90 Pleural effusion, not elsewhere classified: Secondary | ICD-10-CM | POA: Diagnosis not present

## 2018-08-24 DIAGNOSIS — M6281 Muscle weakness (generalized): Secondary | ICD-10-CM | POA: Diagnosis not present

## 2018-08-24 DIAGNOSIS — R2681 Unsteadiness on feet: Secondary | ICD-10-CM | POA: Diagnosis not present

## 2018-08-26 DIAGNOSIS — E559 Vitamin D deficiency, unspecified: Secondary | ICD-10-CM | POA: Diagnosis not present

## 2018-08-27 DIAGNOSIS — M6281 Muscle weakness (generalized): Secondary | ICD-10-CM | POA: Diagnosis not present

## 2018-08-27 DIAGNOSIS — R2681 Unsteadiness on feet: Secondary | ICD-10-CM | POA: Diagnosis not present

## 2018-08-31 DIAGNOSIS — R2681 Unsteadiness on feet: Secondary | ICD-10-CM | POA: Diagnosis not present

## 2018-08-31 DIAGNOSIS — M6281 Muscle weakness (generalized): Secondary | ICD-10-CM | POA: Diagnosis not present

## 2018-09-02 DIAGNOSIS — M6281 Muscle weakness (generalized): Secondary | ICD-10-CM | POA: Diagnosis not present

## 2018-09-02 DIAGNOSIS — R2681 Unsteadiness on feet: Secondary | ICD-10-CM | POA: Diagnosis not present

## 2018-09-03 DIAGNOSIS — M25512 Pain in left shoulder: Secondary | ICD-10-CM | POA: Diagnosis not present

## 2018-09-08 DIAGNOSIS — M6281 Muscle weakness (generalized): Secondary | ICD-10-CM | POA: Diagnosis not present

## 2018-09-08 DIAGNOSIS — R2681 Unsteadiness on feet: Secondary | ICD-10-CM | POA: Diagnosis not present

## 2018-09-09 ENCOUNTER — Other Ambulatory Visit: Payer: Self-pay | Admitting: Physician Assistant

## 2018-09-09 NOTE — Telephone Encounter (Signed)
Order Providers   Prescribing Provider Encounter Provider  Almyra Deforest, PA Almyra Deforest, [place]  Supervision Information   Supervising Provider Type of Supervision  Lelon Perla, MD Supervision Required  Outpatient Medication Detail    Disp Refills Start End   metoprolol tartrate (LOPRESSOR) 25 MG tablet 90 tablet 3 03/18/2018    Sig - Route: Take 1 tablet (25 mg total) by mouth 2 (two) times daily. - Oral   Sent to pharmacy as: metoprolol tartrate (LOPRESSOR) 25 MG tablet   E-Prescribing Status: Receipt confirmed by pharmacy (03/18/2018 3:53 PM EDT)   Pharmacy   [id number] - [place], [place] - [address]

## 2018-09-10 DIAGNOSIS — R921 Mammographic calcification found on diagnostic imaging of breast: Secondary | ICD-10-CM | POA: Diagnosis not present

## 2018-09-10 DIAGNOSIS — Z853 Personal history of malignant neoplasm of breast: Secondary | ICD-10-CM | POA: Diagnosis not present

## 2018-09-10 DIAGNOSIS — R2681 Unsteadiness on feet: Secondary | ICD-10-CM | POA: Diagnosis not present

## 2018-09-10 DIAGNOSIS — R922 Inconclusive mammogram: Secondary | ICD-10-CM | POA: Diagnosis not present

## 2018-09-10 DIAGNOSIS — M6281 Muscle weakness (generalized): Secondary | ICD-10-CM | POA: Diagnosis not present

## 2018-09-14 DIAGNOSIS — M6281 Muscle weakness (generalized): Secondary | ICD-10-CM | POA: Diagnosis not present

## 2018-09-14 DIAGNOSIS — R2681 Unsteadiness on feet: Secondary | ICD-10-CM | POA: Diagnosis not present

## 2018-09-17 DIAGNOSIS — M6281 Muscle weakness (generalized): Secondary | ICD-10-CM | POA: Diagnosis not present

## 2018-09-17 DIAGNOSIS — R2681 Unsteadiness on feet: Secondary | ICD-10-CM | POA: Diagnosis not present

## 2018-09-21 DIAGNOSIS — R2681 Unsteadiness on feet: Secondary | ICD-10-CM | POA: Diagnosis not present

## 2018-09-21 DIAGNOSIS — M6281 Muscle weakness (generalized): Secondary | ICD-10-CM | POA: Diagnosis not present

## 2018-09-23 DIAGNOSIS — M6281 Muscle weakness (generalized): Secondary | ICD-10-CM | POA: Diagnosis not present

## 2018-09-23 DIAGNOSIS — R2681 Unsteadiness on feet: Secondary | ICD-10-CM | POA: Diagnosis not present

## 2018-10-25 ENCOUNTER — Other Ambulatory Visit: Payer: Self-pay | Admitting: Cardiovascular Disease

## 2018-10-28 DIAGNOSIS — F319 Bipolar disorder, unspecified: Secondary | ICD-10-CM | POA: Diagnosis not present

## 2018-10-28 DIAGNOSIS — E039 Hypothyroidism, unspecified: Secondary | ICD-10-CM | POA: Diagnosis not present

## 2018-10-28 DIAGNOSIS — Z0000 Encounter for general adult medical examination without abnormal findings: Secondary | ICD-10-CM | POA: Diagnosis not present

## 2018-10-28 DIAGNOSIS — G301 Alzheimer's disease with late onset: Secondary | ICD-10-CM | POA: Diagnosis not present

## 2018-10-28 DIAGNOSIS — E7800 Pure hypercholesterolemia, unspecified: Secondary | ICD-10-CM | POA: Diagnosis not present

## 2018-10-28 DIAGNOSIS — I10 Essential (primary) hypertension: Secondary | ICD-10-CM | POA: Diagnosis not present

## 2018-10-28 DIAGNOSIS — Z1389 Encounter for screening for other disorder: Secondary | ICD-10-CM | POA: Diagnosis not present

## 2018-10-28 DIAGNOSIS — F259 Schizoaffective disorder, unspecified: Secondary | ICD-10-CM | POA: Diagnosis not present

## 2018-10-28 DIAGNOSIS — Z79899 Other long term (current) drug therapy: Secondary | ICD-10-CM | POA: Diagnosis not present

## 2018-10-29 DIAGNOSIS — I10 Essential (primary) hypertension: Secondary | ICD-10-CM | POA: Diagnosis not present

## 2018-12-15 DIAGNOSIS — M545 Low back pain: Secondary | ICD-10-CM | POA: Diagnosis not present

## 2018-12-15 DIAGNOSIS — M1712 Unilateral primary osteoarthritis, left knee: Secondary | ICD-10-CM | POA: Diagnosis not present

## 2018-12-26 ENCOUNTER — Other Ambulatory Visit: Payer: Self-pay | Admitting: Nurse Practitioner

## 2018-12-28 DIAGNOSIS — M545 Low back pain: Secondary | ICD-10-CM | POA: Diagnosis not present

## 2018-12-29 NOTE — Telephone Encounter (Signed)
This is Dr. Fruitland's pt.  °

## 2019-01-06 DIAGNOSIS — M545 Low back pain: Secondary | ICD-10-CM | POA: Diagnosis not present

## 2019-01-13 DIAGNOSIS — M25562 Pain in left knee: Secondary | ICD-10-CM | POA: Diagnosis not present

## 2019-01-13 DIAGNOSIS — M545 Low back pain: Secondary | ICD-10-CM | POA: Diagnosis not present

## 2019-01-23 ENCOUNTER — Other Ambulatory Visit: Payer: Self-pay | Admitting: Nurse Practitioner

## 2019-01-26 DIAGNOSIS — K219 Gastro-esophageal reflux disease without esophagitis: Secondary | ICD-10-CM | POA: Diagnosis not present

## 2019-01-26 DIAGNOSIS — R0789 Other chest pain: Secondary | ICD-10-CM | POA: Diagnosis not present

## 2019-01-26 DIAGNOSIS — E669 Obesity, unspecified: Secondary | ICD-10-CM | POA: Diagnosis not present

## 2019-01-26 DIAGNOSIS — I10 Essential (primary) hypertension: Secondary | ICD-10-CM | POA: Diagnosis not present

## 2019-02-01 DIAGNOSIS — M545 Low back pain: Secondary | ICD-10-CM | POA: Diagnosis not present

## 2019-02-01 DIAGNOSIS — M25562 Pain in left knee: Secondary | ICD-10-CM | POA: Diagnosis not present

## 2019-02-03 DIAGNOSIS — E782 Mixed hyperlipidemia: Secondary | ICD-10-CM | POA: Diagnosis not present

## 2019-02-03 DIAGNOSIS — G301 Alzheimer's disease with late onset: Secondary | ICD-10-CM | POA: Diagnosis not present

## 2019-02-03 DIAGNOSIS — I10 Essential (primary) hypertension: Secondary | ICD-10-CM | POA: Diagnosis not present

## 2019-02-03 DIAGNOSIS — E039 Hypothyroidism, unspecified: Secondary | ICD-10-CM | POA: Diagnosis not present

## 2019-02-12 ENCOUNTER — Encounter (HOSPITAL_COMMUNITY): Payer: Self-pay | Admitting: Emergency Medicine

## 2019-02-12 ENCOUNTER — Emergency Department (HOSPITAL_COMMUNITY): Payer: Medicare HMO

## 2019-02-12 ENCOUNTER — Other Ambulatory Visit: Payer: Self-pay

## 2019-02-12 ENCOUNTER — Inpatient Hospital Stay (HOSPITAL_COMMUNITY)
Admission: EM | Admit: 2019-02-12 | Discharge: 2019-02-15 | DRG: 872 | Disposition: A | Discharge status: Home Health | Payer: Medicare HMO | Attending: Internal Medicine | Admitting: Internal Medicine

## 2019-02-12 DIAGNOSIS — E039 Hypothyroidism, unspecified: Secondary | ICD-10-CM | POA: Diagnosis present

## 2019-02-12 DIAGNOSIS — R112 Nausea with vomiting, unspecified: Secondary | ICD-10-CM | POA: Diagnosis not present

## 2019-02-12 DIAGNOSIS — Z8249 Family history of ischemic heart disease and other diseases of the circulatory system: Secondary | ICD-10-CM

## 2019-02-12 DIAGNOSIS — E876 Hypokalemia: Secondary | ICD-10-CM | POA: Diagnosis present

## 2019-02-12 DIAGNOSIS — R109 Unspecified abdominal pain: Secondary | ICD-10-CM | POA: Diagnosis not present

## 2019-02-12 DIAGNOSIS — R1084 Generalized abdominal pain: Secondary | ICD-10-CM | POA: Diagnosis not present

## 2019-02-12 DIAGNOSIS — J181 Lobar pneumonia, unspecified organism: Secondary | ICD-10-CM

## 2019-02-12 DIAGNOSIS — F319 Bipolar disorder, unspecified: Secondary | ICD-10-CM | POA: Diagnosis present

## 2019-02-12 DIAGNOSIS — A0811 Acute gastroenteropathy due to Norwalk agent: Secondary | ICD-10-CM | POA: Diagnosis present

## 2019-02-12 DIAGNOSIS — Z888 Allergy status to other drugs, medicaments and biological substances status: Secondary | ICD-10-CM

## 2019-02-12 DIAGNOSIS — I110 Hypertensive heart disease with heart failure: Secondary | ICD-10-CM | POA: Diagnosis present

## 2019-02-12 DIAGNOSIS — F250 Schizoaffective disorder, bipolar type: Secondary | ICD-10-CM

## 2019-02-12 DIAGNOSIS — Z79890 Hormone replacement therapy (postmenopausal): Secondary | ICD-10-CM

## 2019-02-12 DIAGNOSIS — F0390 Unspecified dementia without behavioral disturbance: Secondary | ICD-10-CM | POA: Diagnosis present

## 2019-02-12 DIAGNOSIS — K219 Gastro-esophageal reflux disease without esophagitis: Secondary | ICD-10-CM | POA: Diagnosis present

## 2019-02-12 DIAGNOSIS — A419 Sepsis, unspecified organism: Secondary | ICD-10-CM

## 2019-02-12 DIAGNOSIS — R197 Diarrhea, unspecified: Secondary | ICD-10-CM

## 2019-02-12 DIAGNOSIS — Z87891 Personal history of nicotine dependence: Secondary | ICD-10-CM

## 2019-02-12 DIAGNOSIS — E860 Dehydration: Secondary | ICD-10-CM | POA: Diagnosis not present

## 2019-02-12 DIAGNOSIS — F259 Schizoaffective disorder, unspecified: Secondary | ICD-10-CM | POA: Diagnosis present

## 2019-02-12 DIAGNOSIS — F419 Anxiety disorder, unspecified: Secondary | ICD-10-CM | POA: Diagnosis present

## 2019-02-12 DIAGNOSIS — Z9049 Acquired absence of other specified parts of digestive tract: Secondary | ICD-10-CM

## 2019-02-12 DIAGNOSIS — E785 Hyperlipidemia, unspecified: Secondary | ICD-10-CM | POA: Diagnosis present

## 2019-02-12 DIAGNOSIS — Z79899 Other long term (current) drug therapy: Secondary | ICD-10-CM

## 2019-02-12 DIAGNOSIS — R0902 Hypoxemia: Secondary | ICD-10-CM | POA: Diagnosis not present

## 2019-02-12 DIAGNOSIS — R531 Weakness: Secondary | ICD-10-CM | POA: Diagnosis not present

## 2019-02-12 DIAGNOSIS — J189 Pneumonia, unspecified organism: Secondary | ICD-10-CM

## 2019-02-12 DIAGNOSIS — A4189 Other specified sepsis: Principal | ICD-10-CM | POA: Diagnosis present

## 2019-02-12 DIAGNOSIS — R52 Pain, unspecified: Secondary | ICD-10-CM | POA: Diagnosis not present

## 2019-02-12 DIAGNOSIS — Z79891 Long term (current) use of opiate analgesic: Secondary | ICD-10-CM

## 2019-02-12 DIAGNOSIS — I4510 Unspecified right bundle-branch block: Secondary | ICD-10-CM | POA: Diagnosis not present

## 2019-02-12 DIAGNOSIS — Z882 Allergy status to sulfonamides status: Secondary | ICD-10-CM

## 2019-02-12 DIAGNOSIS — I5032 Chronic diastolic (congestive) heart failure: Secondary | ICD-10-CM | POA: Diagnosis present

## 2019-02-12 LAB — COMPREHENSIVE METABOLIC PANEL
ALK PHOS: 33 U/L — AB (ref 38–126)
ALT: 34 U/L (ref 0–44)
ANION GAP: 11 (ref 5–15)
AST: 28 U/L (ref 15–41)
Albumin: 3.9 g/dL (ref 3.5–5.0)
BUN: 33 mg/dL — ABNORMAL HIGH (ref 8–23)
CALCIUM: 8.9 mg/dL (ref 8.9–10.3)
CHLORIDE: 110 mmol/L (ref 98–111)
CO2: 20 mmol/L — AB (ref 22–32)
CREATININE: 0.96 mg/dL (ref 0.44–1.00)
GFR, EST NON AFRICAN AMERICAN: 57 mL/min — AB (ref >60)
Glucose, Bld: 134 mg/dL — ABNORMAL HIGH (ref 70–99)
Potassium: 3.4 mmol/L — ABNORMAL LOW (ref 3.5–5.1)
SODIUM: 141 mmol/L (ref 135–145)
Total Bilirubin: 0.6 mg/dL (ref 0.3–1.2)
Total Protein: 6.8 g/dL (ref 6.5–8.1)

## 2019-02-12 LAB — CBC WITH DIFFERENTIAL/PLATELET
Abs Immature Granulocytes: 0.02 10*3/uL (ref 0.00–0.07)
BASOS PCT: 0 %
Basophils Absolute: 0 10*3/uL (ref 0.0–0.1)
EOS PCT: 1 %
Eosinophils Absolute: 0 10*3/uL (ref 0.0–0.5)
HCT: 48.4 % — ABNORMAL HIGH (ref 36.0–46.0)
Hemoglobin: 15.6 g/dL — ABNORMAL HIGH (ref 12.0–15.0)
Immature Granulocytes: 0 %
LYMPHS ABS: 0.3 10*3/uL — AB (ref 0.7–4.0)
LYMPHS PCT: 5 %
MCH: 32.2 pg (ref 26.0–34.0)
MCHC: 32.2 g/dL (ref 30.0–36.0)
MCV: 99.8 fL (ref 80.0–100.0)
MONO ABS: 0.6 10*3/uL (ref 0.1–1.0)
Monocytes Relative: 8 %
Neutro Abs: 6.1 10*3/uL (ref 1.7–7.7)
Neutrophils Relative %: 86 %
PLATELETS: 252 10*3/uL (ref 150–400)
RBC: 4.85 MIL/uL (ref 3.87–5.11)
RDW: 13.3 % (ref 11.5–15.5)
WBC: 7 10*3/uL (ref 4.0–10.5)
nRBC: 0 % (ref 0.0–0.2)

## 2019-02-12 LAB — INFLUENZA PANEL BY PCR (TYPE A & B)
INFLBPCR: NEGATIVE
Influenza A By PCR: NEGATIVE

## 2019-02-12 LAB — PROTIME-INR
INR: 1 (ref 0.8–1.2)
PROTHROMBIN TIME: 13.5 s (ref 11.4–15.2)

## 2019-02-12 LAB — MAGNESIUM: MAGNESIUM: 1.8 mg/dL (ref 1.7–2.4)

## 2019-02-12 LAB — LACTIC ACID, PLASMA: Lactic Acid, Venous: 3 mmol/L (ref 0.5–1.9)

## 2019-02-12 MED ORDER — ONDANSETRON HCL 4 MG/2ML IJ SOLN
4.0000 mg | Freq: Once | INTRAMUSCULAR | Status: AC
Start: 2019-02-12 — End: 2019-02-12
  Administered 2019-02-12: 4 mg via INTRAVENOUS
  Filled 2019-02-12: qty 2

## 2019-02-12 MED ORDER — ACETAMINOPHEN 500 MG PO TABS
1000.0000 mg | ORAL_TABLET | Freq: Once | ORAL | Status: AC
  Administered 2019-02-12: 1000 mg via ORAL
  Filled 2019-02-12: qty 2

## 2019-02-12 MED ORDER — SODIUM CHLORIDE 0.9 % IV BOLUS
1000.0000 mL | Freq: Once | INTRAVENOUS | Status: AC
  Administered 2019-02-12: 1000 mL via INTRAVENOUS

## 2019-02-12 MED ORDER — SODIUM CHLORIDE 0.9% FLUSH
3.0000 mL | Freq: Once | INTRAVENOUS | Status: AC
  Administered 2019-02-12: 3 mL via INTRAVENOUS

## 2019-02-12 MED ORDER — SODIUM CHLORIDE 0.9 % IV BOLUS: 500.0000 mL | Freq: Once | INTRAVENOUS | Status: DC

## 2019-02-12 MED ORDER — LORAZEPAM 2 MG/ML IJ SOLN
1.0000 mg | Freq: Once | INTRAMUSCULAR | Status: AC
  Administered 2019-02-12: 1 mg via INTRAVENOUS
  Filled 2019-02-12: qty 1

## 2019-02-12 NOTE — ED Notes (Signed)
Bed: SX11 Expected date:  Expected time:  Means of arrival:  Comments: [facility]

## 2019-02-12 NOTE — ED Triage Notes (Signed)
Pt presents by [facility] from [facility] for evaluation of nausea and vomiting. Pt reports that symptoms started today and had appx 6 episodes of vomiting with diarrhea.

## 2019-02-12 NOTE — ED Notes (Signed)
Date and time results received: 02/12/19 11:38 PM  (use smartphrase ".now" to insert current time)  Test: Lactic Acid Critical Value: 3  Name of Provider Notified: Alyssa, PA  Orders Received? Or Actions Taken?:

## 2019-02-12 NOTE — ED Provider Notes (Signed)
[facility] DEPT Provider Note   CSN: [id number] Arrival date & time: 02/12/19  2142    History   Chief Complaint Chief Complaint  Patient presents with  . Emesis    HPI [name] is a 78 y.o. female with a history of anxiety, right bundle branch block, hyperlipidemia, heart murmur, hypertension, osteoarthritis, hypothyroidism, and breast cancer who presents to the emergency department by EMS from [facility] with a chief complaint of vomiting.  The patient reports that she was feeling generally unwell earlier this morning and began feeling nauseated around 1330.  She reports that she began to have nonbloody, nonbilious emesis and nonbloody diarrhea at approximately 1430.  She reports greater than 6 episodes of each.  She was given 500 mL's of normal saline and Zofran by EMS in route.  No other treatment prior to arrival.  She reports that she has been feeling slightly short of breath, but reports she is also feeling anxious about being at the hospital.  She denies recent cough.  Reports that she did develop a sore throat, but only after vomiting.  She reports that her abdomen is diffusely tender, but the pain only began after vomiting.  She reports that her left ear was painful 2 to 3 days ago, but since resolved.  She did not relate she had a fever until she arrived in the ER.  She reports that she has been feeling weak and tired since of the vomiting and diarrhea began.  She denies headache, myalgias, chest pain, chills, cough, nasal congestion, constipation, neck pain or stiffness, vaginal bleeding, pain, or discharge.  She reports she was having some burning dysuria, but reports that this did not start until after she had been wiping frequently from the diarrhea.  No suspicious food intake.  She reports that several residents at [facility] have had to come to the ER recently for vomiting and diarrhea, but she has not spent much time around any  residents that have been ill recently.  She is up-to-date on her influenza vaccination.  She is allergic to sulfa antibiotics.  No recent travel.  No recent antibiotic use.  Previous surgical procedures include thoracentesis for pleural effusion and cholecystectomy.     The history is provided by the patient. No language interpreter was used.    Past Medical History:  Diagnosis Date  . Anxiety   . Arrhythmia    right bundle branch block  . Bipolar 1 disorder ([place])   . Cancer ([place])   . Hyperlipidemia   . Hypertension   . Morbid obesity ([place])   . Osteoarthritis   . Schizo-affective schizophrenia ([place])   . Thyroid disease    hypothyroidism    Patient Active Problem List   Diagnosis Date Noted  . Sepsis ([place]) 02/13/2019  . HLD (hyperlipidemia) 02/13/2019  . Anxiety 02/13/2019  . Chronic diastolic CHF (congestive heart failure) ([place]) 02/13/2019  . GERD (gastroesophageal reflux disease) 02/13/2019  . Nausea vomiting and diarrhea 02/13/2019  . Schizo-affective schizophrenia ([place])   . Bipolar 1 disorder ([place])   . Pleural effusion on right 06/28/2018  . Osteoarthritis of left knee 11/09/2014  . Nocturia more than twice per night 11/09/2014  . Right bundle branch block 02/23/2013  . Irritable bowel syndrome with constipation and diarrhea 10/28/2012  . Breast cancer ([place]) 04/07/2012  . Dermatitis 12/24/2011  . Hypercholesterolemia 04/30/2011  . Hypothyroidism 04/30/2011  . Dementia ([place]) 04/30/2011  . Benign hypertensive heart disease without heart failure 04/30/2011  Past Surgical History:  Procedure Laterality Date  . BREAST SURGERY    . CHOLECYSTECTOMY    . DILATION AND CURETTAGE OF UTERUS    . ESOPHAGEAL MANOMETRY N/A 01/29/2018   Procedure: ESOPHAGEAL MANOMETRY (EM);  Surgeon: Wonda Horner, MD;  Location: [facility] ENDOSCOPY;  Service: Endoscopy;  Laterality: N/A;  . HAND SURGERY     Right-trigger finger  . IR THORACENTESIS ASP PLEURAL SPACE W/IMG GUIDE  06/28/2018  .  IR THORACENTESIS ASP PLEURAL SPACE W/IMG GUIDE  07/19/2018  . KNEE SURGERY     Left  . TONSILLECTOMY       OB History   No obstetric history on file.      Home Medications    Prior to Admission medications   Medication Sig Start Date End Date Taking? Authorizing Provider  acetaminophen (TYLENOL) 500 MG tablet Take 500 mg by mouth every 12 (twelve) hours as needed for mild pain, moderate pain, fever or headache.   Yes [provider]  ALPRAZolam Duanne Moron) 0.5 MG tablet Take 1 mg by mouth 4 (four) times daily as needed for anxiety.    Yes [provider]  CALCIUM MAGNESIUM 750 PO Take 1 tablet by mouth 2 (two) times daily.   Yes [provider]  donepezil (ARICEPT) 10 MG tablet Take 10 mg by mouth every morning.    Yes [provider]  fenofibrate (TRICOR) 145 MG tablet TAKE 1 TABLET EVERY TUESDAY,THURSDAY,AND SATURDAY AT 6 PM Patient taking differently: Take 145 mg by mouth See admin instructions.  10/25/18  Yes Skeet Latch, MD  HYDROcodone-acetaminophen (NORCO/VICODIN) 5-325 MG tablet Take 1 tablet by mouth daily as needed for pain.   Yes [provider]  LACTOBACILLUS PO Take 1 capsule by mouth daily.   Yes [provider]  lamoTRIgine (LAMICTAL) 150 MG tablet Take 150 mg by mouth daily.  04/06/16  Yes [provider]  memantine (NAMENDA) 10 MG tablet Take 10 mg by mouth 2 (two) times daily.     Yes [provider]  metoprolol tartrate (LOPRESSOR) 25 MG tablet TAKE 1 TABLET BY MOUTH EVERY DAY Patient taking differently: Take 25 mg by mouth 2 (two) times daily.  12/29/18  Yes Kilroy, Luke K, PA-C  Multiple Vitamins-Minerals (MULTIVITAMIN ADULT PO) Take 1 tablet by mouth daily.   Yes [provider]  OLANZapine (ZYPREXA) 15 MG tablet Take 15 mg by mouth 2 (two) times daily.    Yes [provider]  Omega-3 Fatty Acids (FISH OIL) 1000 MG CAPS Take 1,000 mg by mouth daily.   Yes [provider]  omeprazole (PRILOSEC) 40 MG capsule Take 40 mg by mouth daily.   Yes [provider]  QUEtiapine (SEROQUEL) 25 MG tablet Take 25 mg by mouth 3 (three) times daily.    Yes [provider]  ramelteon (ROZEREM) 8 MG tablet Take 8 mg by mouth at bedtime as needed for sleep.    Yes [provider]  spironolactone (ALDACTONE) 25 MG tablet TAKE 1 TABLET BY MOUTH DAILY Patient taking differently: Take 25 mg by mouth daily.  01/24/19  Yes Gerhardt, Marlane Hatcher, NP  SYNTHROID 150 MCG tablet TAKE 1 TABLET (150 MCG TOTAL) BY MOUTH DAILY. Patient taking differently: Take 150 mcg by mouth daily before breakfast.  10/23/15  Yes Darlin Coco, MD    Family History Family History  Problem Relation Age of Onset  . Hypertension Mother     Social History Social History   Tobacco Use  .  Smoking status: Former Smoker    Last attempt to quit: 12/08/1978    Years since quitting: 40.2  . Smokeless tobacco: Never Used  Substance Use Topics  . Alcohol use: Yes    Comment: 1 time a year  . Drug use: No     Allergies   Lithium; Fluoxetine; Macrodantin; Mirabegron; Paroxetine hcl; Paxil [paroxetine]; Prozac [fluoxetine hcl]; Sertraline hcl; Solifenacin; Sulfa antibiotics; and Wellbutrin [bupropion hcl]   Review of Systems Review of Systems  Constitutional: Positive for fatigue and fever. Negative for activity change and chills.  HENT: Positive for ear pain and sore throat. Negative for congestion.   Respiratory: Negative for shortness of breath.   Cardiovascular: Negative for chest pain.  Gastrointestinal: Positive for abdominal pain, diarrhea, nausea and vomiting. Negative for anal bleeding, blood in stool, constipation and rectal pain.  Genitourinary: Positive for dysuria. Negative for hematuria, urgency, vaginal bleeding, vaginal discharge and vaginal pain.  Musculoskeletal: Negative for back pain.  Skin: Negative for rash.  Allergic/Immunologic: Negative for  immunocompromised state.  Neurological: Positive for weakness. Negative for dizziness, numbness and headaches.  Psychiatric/Behavioral: Negative for confusion.   Physical Exam Updated Vital Signs BP 124/66 (BP Location: Left Leg)   Pulse 98   Temp 99.7 F (37.6 C) (Oral)   Resp 18   Ht 5\' 2"  (1.575 m)   Wt 81.1 kg   SpO2 95%   BMI 32.70 kg/m   Physical Exam Vitals signs and nursing note reviewed.  Constitutional:      General: She is not in acute distress.    Appearance: She is not toxic-appearing.  HENT:     Head: Normocephalic.     Comments: Mucous membranes are dry.  Lips are dry.    Right Ear: Hearing, ear canal and external ear normal. No mastoid tenderness. Tympanic membrane is not injected, perforated or bulging.     Left Ear: Hearing, ear canal and external ear normal. No mastoid tenderness. Tympanic membrane is injected and bulging. Tympanic membrane is not perforated.     Nose: Rhinorrhea present.     Mouth/Throat:     Mouth: Mucous membranes are dry.     Pharynx: Posterior oropharyngeal erythema present. No pharyngeal swelling, oropharyngeal exudate or uvula swelling.     Tonsils: No tonsillar exudate or tonsillar abscesses.  Eyes:     Conjunctiva/sclera: Conjunctivae normal.  Neck:     Musculoskeletal: Neck supple.  Cardiovascular:     Rate and Rhythm: Normal rate and regular rhythm.     Pulses: Normal pulses.     Heart sounds: Murmur present. No friction rub. No gallop.   Pulmonary:     Effort: Pulmonary effort is normal. No respiratory distress.     Breath sounds: No stridor. No wheezing, rhonchi or rales.     Comments: Lung sounds are diminished bilaterally, but no adventitious breath sounds Abdominal:     General: Bowel sounds are normal. There is no distension.     Palpations: Abdomen is soft. There is no mass.     Tenderness: There is no abdominal tenderness. There is no right CVA tenderness, left CVA tenderness, guarding or rebound.     Hernia: No  hernia is present.     Comments: Active bowel sounds in all 4 quadrants.  Abdomen is soft, nontender, nondistended.  No CVA tenderness bilaterally.  No tenderness over McBurney's point.  Negative Murphy sign.  No rebound or guarding.  Musculoskeletal:        General: No tenderness.  Right lower leg: No edema.     Left lower leg: No edema.  Skin:    General: Skin is warm.     Coloration: Skin is not jaundiced.     Findings: No rash.  Neurological:     Mental Status: She is alert.  Psychiatric:        Behavior: Behavior normal.      ED Treatments / Results  Labs (all labs ordered are listed, but only abnormal results are displayed) Labs Reviewed  CBC WITH DIFFERENTIAL/PLATELET - Abnormal; Notable for the following components:      Result Value   Hemoglobin 15.6 (*)    HCT 48.4 (*)    Lymphs Abs 0.3 (*)    All other components within normal limits  COMPREHENSIVE METABOLIC PANEL - Abnormal; Notable for the following components:   Potassium 3.4 (*)    CO2 20 (*)    Glucose, Bld 134 (*)    BUN 33 (*)    Alkaline Phosphatase 33 (*)    GFR calc non Af Amer 57 (*)    All other components within normal limits  LACTIC ACID, PLASMA - Abnormal; Notable for the following components:   Lactic Acid, Venous 3.0 (*)    All other components within normal limits  LACTIC ACID, PLASMA - Abnormal; Notable for the following components:   Lactic Acid, Venous 2.3 (*)    All other components within normal limits  BRAIN NATRIURETIC PEPTIDE - Abnormal; Notable for the following components:   B Natriuretic Peptide 369.9 (*)    All other components within normal limits  LACTIC ACID, PLASMA - Abnormal; Notable for the following components:   Lactic Acid, Venous 2.4 (*)    All other components within normal limits  BASIC METABOLIC PANEL - Abnormal; Notable for the following components:   Potassium 3.2 (*)    CO2 20 (*)    Glucose, Bld 119 (*)    BUN 28 (*)    Calcium 7.5 (*)    All other  components within normal limits  CBC - Abnormal; Notable for the following components:   MCV 100.2 (*)    All other components within normal limits  CULTURE, BLOOD (ROUTINE X 2)  CULTURE, BLOOD (ROUTINE X 2)  C DIFFICILE QUICK SCREEN W PCR REFLEX  GASTROINTESTINAL PANEL BY PCR, STOOL (REPLACES STOOL CULTURE)  MRSA PCR SCREENING  MAGNESIUM  PROTIME-INR  URINALYSIS, ROUTINE W REFLEX MICROSCOPIC  INFLUENZA PANEL BY PCR (TYPE A & B)  TSH  PROCALCITONIN  LACTIC ACID, PLASMA    EKG None  Radiology Dg Chest 2 View  Result Date: 02/12/2019 CLINICAL DATA:  Nausea and vomiting EXAM: CHEST - 2 VIEW COMPARISON:  07/19/2018 FINDINGS: Top-normal heart size. Nonaneurysmal thoracic aorta. Lungs are clear. No effusion or pneumothorax. Degenerative changes are present along the thoracic spine. IMPRESSION: No active cardiopulmonary disease. Electronically Signed   By: Ashley Royalty M.D.   On: 02/12/2019 23:38   Ct Abdomen Pelvis W Contrast  Result Date: 02/13/2019 CLINICAL DATA:  Nausea and vomiting. Abdominal pain EXAM: CT ABDOMEN AND PELVIS WITH CONTRAST TECHNIQUE: Multidetector CT imaging of the abdomen and pelvis was performed using the standard protocol following bolus administration of intravenous contrast. CONTRAST:  138mL ISOVUE-300 IOPAMIDOL (ISOVUE-300) INJECTION 61% COMPARISON:  Report from abdominal CT 07/23/2018 outside institution FINDINGS: Lower chest: Tree in bud opacities in the right middle lobe. Linear subsegmental atelectasis. Hepatobiliary: Hepatic steatosis with focal abnormality. Clips in the gallbladder fossa postcholecystectomy. No biliary dilatation. Pancreas: No  ductal dilatation or inflammation. Spleen: Normal in size without focal abnormality. Adrenals/Urinary Tract: Normal adrenal glands. No hydronephrosis or perinephric edema. Homogeneous renal enhancement with symmetric excretion on delayed phase imaging. Scattered cortical hypodensities are too small to accurately characterize  but likely small cysts. Urinary bladder is physiologically distended without wall thickening. Stomach/Bowel: Prior fundoplication. Stomach physiologically distended. Fluid-filled nondilated small bowel in a nonobstructive pattern. Fluid/liquid stool in the ascending, transverse, and proximal descending colon. Formed stool in the more distal colon. No colonic wall thickening or inflammatory change. Normal appendix. Vascular/Lymphatic: Aortic atherosclerosis without aneurysm. High-density material within the retroperitoneal, coursing along the pelvic vasculature, and in the pelvis, some of which are within lymph nodes, likely sequela of prior lymphangiography. No enlarged lymph nodes in the abdomen or pelvis. Reproductive: Uterus and bilateral adnexa are unremarkable. Other: No free air, free fluid, or intra-abdominal fluid collection. Musculoskeletal: There are no acute or suspicious osseous abnormalities. IMPRESSION: 1. Fluid-filled nondilated small bowel with liquid stool in the ascending, transverse, and proximal descending colon, consistent with diarrheal process a possible non-specific enteritis. No bowel inflammation. 2. Hepatic steatosis. 3. Tree in bud opacities in the right middle lobe may be infectious or inflammatory. Aortic Atherosclerosis (ICD10-I70.0). Electronically Signed   By: Keith Rake M.D.   On: 02/13/2019 01:35    Procedures .Critical Care Performed by: Joanne Gavel, PA-C Authorized by: Joanne Gavel, PA-C   Critical care provider statement:    Critical care time (minutes):  45   Critical care was necessary to treat or prevent imminent or life-threatening deterioration of the following conditions:  Sepsis   Critical care was time spent personally by me on the following activities:  Ordering and performing treatments and interventions, ordering and review of laboratory studies, ordering and review of radiographic studies, re-evaluation of patient's condition, review of old  charts, pulse oximetry, obtaining history from patient or surrogate, examination of patient, evaluation of patient's response to treatment and development of treatment plan with patient or surrogate   (including critical care time)  Medications Ordered in ED Medications  iopamidol (ISOVUE-300) 61 % injection (has no administration in time range)  sodium chloride (PF) 0.9 % injection (has no administration in time range)  HYDROcodone-acetaminophen (NORCO/VICODIN) 5-325 MG per tablet 1 tablet (has no administration in time range)  fenofibrate tablet 160 mg (160 mg Oral Not Given 02/13/19 0848)  metoprolol tartrate (LOPRESSOR) tablet 25 mg (25 mg Oral Given 02/13/19 0844)  ALPRAZolam (XANAX) tablet 1 mg (1 mg Oral Given 02/13/19 0844)  donepezil (ARICEPT) tablet 10 mg (10 mg Oral Given 02/13/19 0847)  memantine (NAMENDA) tablet 10 mg (10 mg Oral Given 02/13/19 0845)  OLANZapine (ZYPREXA) tablet 15 mg (15 mg Oral Given 02/13/19 0845)  QUEtiapine (SEROQUEL) tablet 25 mg (25 mg Oral Given 02/13/19 0846)  ramelteon (ROZEREM) tablet 8 mg (has no administration in time range)  levothyroxine (SYNTHROID, LEVOTHROID) tablet 150 mcg (150 mcg Oral Given 02/13/19 0644)  acidophilus (RISAQUAD) capsule (1 capsule Oral Given 02/13/19 0843)  pantoprazole (PROTONIX) EC tablet 40 mg (40 mg Oral Given 02/13/19 0843)  lamoTRIgine (LAMICTAL) tablet 150 mg (150 mg Oral Given 02/13/19 0842)  calcium carbonate (OS-CAL - dosed in mg of elemental calcium) tablet 500 mg of elemental calcium (500 mg of elemental calcium Oral Not Given 02/13/19 0846)  multivitamin with minerals tablet 1 tablet (1 tablet Oral Not Given 02/13/19 0846)  omega-3 acid ethyl esters (LOVAZA) capsule 1 g (1 g Oral Not Given 02/13/19 0845)  enoxaparin (LOVENOX)  injection 40 mg (40 mg Subcutaneous Given 02/13/19 0847)  acetaminophen (TYLENOL) tablet 650 mg (has no administration in time range)    Or  acetaminophen (TYLENOL) suppository 650 mg (has no administration in time  range)  ondansetron (ZOFRAN) tablet 4 mg (has no administration in time range)    Or  ondansetron (ZOFRAN) injection 4 mg (has no administration in time range)  magnesium oxide (MAG-OX) tablet 200 mg (200 mg Oral Given 02/13/19 0846)  levalbuterol (XOPENEX) nebulizer solution 1.25 mg (has no administration in time range)  hydrALAZINE (APRESOLINE) injection 10 mg (has no administration in time range)  ondansetron (ZOFRAN) injection 4 mg (4 mg Intravenous Given 02/12/19 2310)  sodium chloride flush (NS) 0.9 % injection 3 mL (3 mLs Intravenous Given 02/12/19 2311)  sodium chloride 0.9 % bolus 1,000 mL (0 mLs Intravenous Stopped 02/13/19 0129)  acetaminophen (TYLENOL) tablet 1,000 mg (1,000 mg Oral Given 02/12/19 2305)  LORazepam (ATIVAN) injection 1 mg (1 mg Intravenous Given 02/12/19 2308)  piperacillin-tazobactam (ZOSYN) IVPB 3.375 g (0 g Intravenous Stopped 02/13/19 0129)  vancomycin (VANCOCIN) 1,750 mg in sodium chloride 0.9 % 500 mL IVPB ( Intravenous Stopped 02/13/19 0443)  iopamidol (ISOVUE-300) 61 % injection 100 mL (100 mLs Intravenous Contrast Given 02/13/19 0103)  sodium chloride 0.9 % bolus 1,000 mL (1,000 mLs Intravenous New Bag/Given 02/13/19 0605)  potassium chloride 20 MEQ/15ML (10%) solution 40 mEq (40 mEq Oral Given 02/13/19 0646)  vancomycin (VANCOCIN) 50 mg/mL oral solution 125 mg (125 mg Oral Given 02/13/19 3710)     Initial Impression / Assessment and Plan / ED Course  I have reviewed the triage vital signs and the nursing notes.  Pertinent labs & imaging results that were available during my care of the patient were reviewed by me and considered in my medical decision making (see chart for details).  78 year old female with a history of anxiety, right bundle branch block, hyperlipidemia, heart murmur, hypertension, osteoarthritis, hypothyroidism, and breast cancer presenting by EMS from [facility] with nausea, vomiting, diarrhea, and fever, onset less than 12 hours ago.  She reports several  residents have been ill with similar symptoms, but no close contact with any ill residents.  She is up-to-date on her influenza vaccination.  She otherwise reports left ear pain 2 days ago and mild shortness of breath that she attributes to anxiety from being at the hospital.  She is febrile to 102.2 on arrival and tachycardic in the 120s.  Will order IV fluid bolus, Tylenol, labs, urinalysis, and chest x-ray and plan for reevaluation.  The patient was discussed with Dr. Vallery Ridge, attending physician.  No leukocytosis.  CBC appears somewhat hemoconcentrated.  UA is unremarkable.  Chest x-ray is unremarkable.  ASH is normal.  Influenza is negative.  Mild hypokalemia of 3.4.  Bicarb is 20, likely secondary to diarrhea.  Glucose is 134 and anion gap is normal. CT with a fluid-filled nondilated small bowel with liquid stool in the ascending, transverse, and proximal descending colon, consistent with diarrheal process or possible nonspecific enteritis.  No bowel inflammation.  Low suspicion for PE, influenza, SBO or C. difficile.  There also appears to be tree-in-bud opacities in the right middle lobe that may be infectious or inflammatory.  Differential diagnosis includes a right middle lobe pneumonia versus viral gastroenteritis.  Clinical Course as of Feb 13 932  Sun Feb 13, 2019  0404 Patient recheck. "This is the best Coke I've ever had."  No additional episodes of vomiting.  She remains tachycardic in  the 110s.  Second fluid bolus has been ordered but has not yet been initiated.  Repeat lactate is 2.3, down from 3.0.  Repeat abdominal exam with mild, diffuse tenderness, but no focal tenderness, rebound, or guarding.  She reports that her anxiety is improved, but she still feels somewhat short of breath.  She denies recent cough.  We will continue antibiotics to cover the patient for pneumonia.   [MM]  0405 Consult to the hospitalist team and spoke with Dr. Blaine Hamper who will accept the patient for admission.    [MM]    Clinical Course User Index [MM] Carver Murakami A, PA-C   The patient appears reasonably stabilized for admission considering the current resources, flow, and capabilities available in the ED at this time, and I doubt any other [facility] requiring further screening and/or treatment in the ED prior to admission.        Final Clinical Impressions(s) / ED Diagnoses   Final diagnoses:  Sepsis without acute organ dysfunction, due to unspecified organism [facility])  Pneumonia of right middle lobe due to infectious organism [facility])    ED Discharge Orders    None       Joanne Gavel, PA-C 02/13/19 0933    Charlesetta Shanks, MD 02/13/19 2100

## 2019-02-13 ENCOUNTER — Emergency Department (HOSPITAL_COMMUNITY): Payer: Medicare HMO

## 2019-02-13 ENCOUNTER — Encounter (HOSPITAL_COMMUNITY): Payer: Self-pay

## 2019-02-13 DIAGNOSIS — I110 Hypertensive heart disease with heart failure: Secondary | ICD-10-CM | POA: Diagnosis not present

## 2019-02-13 DIAGNOSIS — M2550 Pain in unspecified joint: Secondary | ICD-10-CM | POA: Diagnosis not present

## 2019-02-13 DIAGNOSIS — F319 Bipolar disorder, unspecified: Secondary | ICD-10-CM | POA: Diagnosis not present

## 2019-02-13 DIAGNOSIS — I700 Atherosclerosis of aorta: Secondary | ICD-10-CM | POA: Diagnosis not present

## 2019-02-13 DIAGNOSIS — F0390 Unspecified dementia without behavioral disturbance: Secondary | ICD-10-CM | POA: Diagnosis not present

## 2019-02-13 DIAGNOSIS — A419 Sepsis, unspecified organism: Secondary | ICD-10-CM | POA: Diagnosis present

## 2019-02-13 DIAGNOSIS — F259 Schizoaffective disorder, unspecified: Secondary | ICD-10-CM | POA: Diagnosis present

## 2019-02-13 DIAGNOSIS — I5032 Chronic diastolic (congestive) heart failure: Secondary | ICD-10-CM | POA: Diagnosis present

## 2019-02-13 DIAGNOSIS — K760 Fatty (change of) liver, not elsewhere classified: Secondary | ICD-10-CM | POA: Diagnosis not present

## 2019-02-13 DIAGNOSIS — F250 Schizoaffective disorder, bipolar type: Secondary | ICD-10-CM

## 2019-02-13 DIAGNOSIS — E039 Hypothyroidism, unspecified: Secondary | ICD-10-CM | POA: Diagnosis not present

## 2019-02-13 DIAGNOSIS — Z882 Allergy status to sulfonamides status: Secondary | ICD-10-CM | POA: Diagnosis not present

## 2019-02-13 DIAGNOSIS — Z79890 Hormone replacement therapy (postmenopausal): Secondary | ICD-10-CM | POA: Diagnosis not present

## 2019-02-13 DIAGNOSIS — Z79891 Long term (current) use of opiate analgesic: Secondary | ICD-10-CM | POA: Diagnosis not present

## 2019-02-13 DIAGNOSIS — R197 Diarrhea, unspecified: Secondary | ICD-10-CM

## 2019-02-13 DIAGNOSIS — R531 Weakness: Secondary | ICD-10-CM | POA: Diagnosis not present

## 2019-02-13 DIAGNOSIS — R112 Nausea with vomiting, unspecified: Secondary | ICD-10-CM | POA: Diagnosis not present

## 2019-02-13 DIAGNOSIS — I959 Hypotension, unspecified: Secondary | ICD-10-CM | POA: Diagnosis not present

## 2019-02-13 DIAGNOSIS — R109 Unspecified abdominal pain: Secondary | ICD-10-CM | POA: Diagnosis not present

## 2019-02-13 DIAGNOSIS — E785 Hyperlipidemia, unspecified: Secondary | ICD-10-CM | POA: Diagnosis not present

## 2019-02-13 DIAGNOSIS — E876 Hypokalemia: Secondary | ICD-10-CM | POA: Diagnosis not present

## 2019-02-13 DIAGNOSIS — Z7401 Bed confinement status: Secondary | ICD-10-CM | POA: Diagnosis not present

## 2019-02-13 DIAGNOSIS — Z8249 Family history of ischemic heart disease and other diseases of the circulatory system: Secondary | ICD-10-CM | POA: Diagnosis not present

## 2019-02-13 DIAGNOSIS — F419 Anxiety disorder, unspecified: Secondary | ICD-10-CM | POA: Diagnosis not present

## 2019-02-13 DIAGNOSIS — Z87891 Personal history of nicotine dependence: Secondary | ICD-10-CM | POA: Diagnosis not present

## 2019-02-13 DIAGNOSIS — A4189 Other specified sepsis: Secondary | ICD-10-CM | POA: Diagnosis not present

## 2019-02-13 DIAGNOSIS — Z79899 Other long term (current) drug therapy: Secondary | ICD-10-CM | POA: Diagnosis not present

## 2019-02-13 DIAGNOSIS — K219 Gastro-esophageal reflux disease without esophagitis: Secondary | ICD-10-CM | POA: Diagnosis present

## 2019-02-13 DIAGNOSIS — R52 Pain, unspecified: Secondary | ICD-10-CM | POA: Diagnosis not present

## 2019-02-13 DIAGNOSIS — Z9049 Acquired absence of other specified parts of digestive tract: Secondary | ICD-10-CM | POA: Diagnosis not present

## 2019-02-13 DIAGNOSIS — J189 Pneumonia, unspecified organism: Secondary | ICD-10-CM | POA: Diagnosis not present

## 2019-02-13 DIAGNOSIS — A0811 Acute gastroenteropathy due to Norwalk agent: Secondary | ICD-10-CM | POA: Diagnosis not present

## 2019-02-13 DIAGNOSIS — Z888 Allergy status to other drugs, medicaments and biological substances status: Secondary | ICD-10-CM | POA: Diagnosis not present

## 2019-02-13 LAB — URINALYSIS, ROUTINE W REFLEX MICROSCOPIC
BILIRUBIN URINE: NEGATIVE
Glucose, UA: NEGATIVE mg/dL
Hgb urine dipstick: NEGATIVE
KETONES UR: NEGATIVE mg/dL
LEUKOCYTE UA: NEGATIVE
NITRITE: NEGATIVE
PROTEIN: NEGATIVE mg/dL
Specific Gravity, Urine: 1.02 (ref 1.005–1.030)
pH: 5 (ref 5.0–8.0)

## 2019-02-13 LAB — CBC
HCT: 42.4 % (ref 36.0–46.0)
Hemoglobin: 13.6 g/dL (ref 12.0–15.0)
MCH: 32.2 pg (ref 26.0–34.0)
MCHC: 32.1 g/dL (ref 30.0–36.0)
MCV: 100.2 fL — ABNORMAL HIGH (ref 80.0–100.0)
PLATELETS: 243 10*3/uL (ref 150–400)
RBC: 4.23 MIL/uL (ref 3.87–5.11)
RDW: 13.6 % (ref 11.5–15.5)
WBC: 8.7 10*3/uL (ref 4.0–10.5)
nRBC: 0 % (ref 0.0–0.2)

## 2019-02-13 LAB — BASIC METABOLIC PANEL
ANION GAP: 9 (ref 5–15)
BUN: 28 mg/dL — ABNORMAL HIGH (ref 8–23)
CALCIUM: 7.5 mg/dL — AB (ref 8.9–10.3)
CO2: 20 mmol/L — AB (ref 22–32)
Chloride: 110 mmol/L (ref 98–111)
Creatinine, Ser: 0.88 mg/dL (ref 0.44–1.00)
GFR calc Af Amer: >60 mL/min (ref >60)
GFR calc non Af Amer: >60 mL/min (ref >60)
Glucose, Bld: 119 mg/dL — ABNORMAL HIGH (ref 70–99)
POTASSIUM: 3.2 mmol/L — AB (ref 3.5–5.1)
Sodium: 139 mmol/L (ref 135–145)

## 2019-02-13 LAB — LACTIC ACID, PLASMA
LACTIC ACID, VENOUS: 2.3 mmol/L — AB (ref 0.5–1.9)
Lactic Acid, Venous: 2.4 mmol/L (ref 0.5–1.9)
Lactic Acid, Venous: 2.8 mmol/L (ref 0.5–1.9)

## 2019-02-13 LAB — PROCALCITONIN: Procalcitonin: 2.49 ng/mL

## 2019-02-13 LAB — C DIFFICILE QUICK SCREEN W PCR REFLEX
C Diff antigen: NEGATIVE
C Diff interpretation: NOT DETECTED
C Diff toxin: NEGATIVE

## 2019-02-13 LAB — TSH: TSH: 3.455 u[IU]/mL (ref 0.350–4.500)

## 2019-02-13 LAB — MRSA PCR SCREENING: MRSA by PCR: NEGATIVE

## 2019-02-13 LAB — BRAIN NATRIURETIC PEPTIDE: B Natriuretic Peptide: 369.9 pg/mL — ABNORMAL HIGH (ref 0.0–100.0)

## 2019-02-13 MED ORDER — FENOFIBRATE 160 MG PO TABS
160.0000 mg | ORAL_TABLET | Freq: Every day | ORAL | Status: DC
  Administered 2019-02-14: 160 mg via ORAL
  Filled 2019-02-13 (×3): qty 1

## 2019-02-13 MED ORDER — ACETAMINOPHEN 325 MG PO TABS: 650.0000 mg | ORAL_TABLET | Freq: Four times a day (QID) | ORAL | Status: DC | PRN

## 2019-02-13 MED ORDER — LEVOTHYROXINE SODIUM 75 MCG PO TABS
150.0000 ug | ORAL_TABLET | Freq: Every day | ORAL | Status: DC
  Administered 2019-02-13 – 2019-02-15 (×3): 150 ug via ORAL
  Filled 2019-02-13 (×3): qty 2

## 2019-02-13 MED ORDER — DONEPEZIL HCL 5 MG PO TABS
10.0000 mg | ORAL_TABLET | Freq: Every morning | ORAL | Status: DC
  Administered 2019-02-13 – 2019-02-15 (×3): 10 mg via ORAL
  Filled 2019-02-13 (×3): qty 2

## 2019-02-13 MED ORDER — LEVALBUTEROL HCL 0.63 MG/3ML IN NEBU: 0.6300 mg | INHALATION_SOLUTION | Freq: Four times a day (QID) | RESPIRATORY_TRACT | Status: DC | PRN

## 2019-02-13 MED ORDER — PIPERACILLIN-TAZOBACTAM 3.375 G IVPB 30 MIN
3.3750 g | INTRAVENOUS | Status: AC
  Administered 2019-02-13: 3.375 g via INTRAVENOUS
  Filled 2019-02-13: qty 50

## 2019-02-13 MED ORDER — PANTOPRAZOLE SODIUM 40 MG PO TBEC
40.0000 mg | DELAYED_RELEASE_TABLET | Freq: Every day | ORAL | Status: DC
  Administered 2019-02-13 – 2019-02-15 (×3): 40 mg via ORAL
  Filled 2019-02-13 (×3): qty 1

## 2019-02-13 MED ORDER — SODIUM CHLORIDE 0.9 % IV SOLN
INTRAVENOUS | Status: DC
  Administered 2019-02-13 – 2019-02-14 (×2): via INTRAVENOUS

## 2019-02-13 MED ORDER — OMEGA-3-ACID ETHYL ESTERS 1 G PO CAPS
1.0000 g | ORAL_CAPSULE | Freq: Every day | ORAL | Status: DC
  Administered 2019-02-14 – 2019-02-15 (×2): 1 g via ORAL
  Filled 2019-02-13 (×3): qty 1

## 2019-02-13 MED ORDER — ALPRAZOLAM 1 MG PO TABS
1.0000 mg | ORAL_TABLET | Freq: Four times a day (QID) | ORAL | Status: DC | PRN
  Administered 2019-02-13 – 2019-02-15 (×4): 1 mg via ORAL
  Filled 2019-02-13 (×4): qty 1

## 2019-02-13 MED ORDER — METOPROLOL TARTRATE 25 MG PO TABS
25.0000 mg | ORAL_TABLET | Freq: Two times a day (BID) | ORAL | Status: DC
  Administered 2019-02-13 – 2019-02-15 (×5): 25 mg via ORAL
  Filled 2019-02-13 (×5): qty 1

## 2019-02-13 MED ORDER — LEVALBUTEROL HCL 1.25 MG/0.5ML IN NEBU: 1.2500 mg | INHALATION_SOLUTION | Freq: Four times a day (QID) | RESPIRATORY_TRACT | Status: DC

## 2019-02-13 MED ORDER — LAMOTRIGINE 25 MG PO TABS
150.0000 mg | ORAL_TABLET | Freq: Every day | ORAL | Status: DC
  Administered 2019-02-13 – 2019-02-15 (×3): 150 mg via ORAL
  Filled 2019-02-13 (×3): qty 2

## 2019-02-13 MED ORDER — RAMELTEON 8 MG PO TABS
8.0000 mg | ORAL_TABLET | Freq: Every evening | ORAL | Status: DC | PRN
  Administered 2019-02-13 – 2019-02-14 (×2): 8 mg via ORAL
  Filled 2019-02-13 (×4): qty 1

## 2019-02-13 MED ORDER — LEVALBUTEROL HCL 1.25 MG/0.5ML IN NEBU
1.2500 mg | INHALATION_SOLUTION | Freq: Four times a day (QID) | RESPIRATORY_TRACT | Status: DC
  Administered 2019-02-13: 1.25 mg via RESPIRATORY_TRACT
  Filled 2019-02-13: qty 0.5

## 2019-02-13 MED ORDER — POTASSIUM CHLORIDE 20 MEQ/15ML (10%) PO SOLN
40.0000 meq | Freq: Once | ORAL | Status: AC
  Administered 2019-02-13: 40 meq via ORAL
  Filled 2019-02-13: qty 30

## 2019-02-13 MED ORDER — QUETIAPINE FUMARATE 25 MG PO TABS
25.0000 mg | ORAL_TABLET | Freq: Three times a day (TID) | ORAL | Status: DC
  Administered 2019-02-13 – 2019-02-15 (×7): 25 mg via ORAL
  Filled 2019-02-13 (×7): qty 1

## 2019-02-13 MED ORDER — VANCOMYCIN HCL 10 G IV SOLR
1750.0000 mg | Freq: Once | INTRAVENOUS | Status: AC
  Administered 2019-02-13: 1750 mg via INTRAVENOUS
  Filled 2019-02-13: qty 1750

## 2019-02-13 MED ORDER — ONDANSETRON HCL 4 MG PO TABS: 4.0000 mg | ORAL_TABLET | Freq: Four times a day (QID) | ORAL | Status: DC | PRN

## 2019-02-13 MED ORDER — HYDROCODONE-ACETAMINOPHEN 5-325 MG PO TABS: 1.0000 | ORAL_TABLET | Freq: Four times a day (QID) | ORAL | Status: DC | PRN

## 2019-02-13 MED ORDER — CALCIUM CARBONATE 1250 (500 CA) MG PO TABS
1.0000 | ORAL_TABLET | Freq: Two times a day (BID) | ORAL | Status: DC
  Administered 2019-02-13 – 2019-02-15 (×4): 500 mg via ORAL
  Filled 2019-02-13 (×5): qty 1

## 2019-02-13 MED ORDER — ONDANSETRON HCL 4 MG/2ML IJ SOLN: 4.0000 mg | Freq: Four times a day (QID) | INTRAMUSCULAR | Status: DC | PRN

## 2019-02-13 MED ORDER — ACETAMINOPHEN 650 MG RE SUPP: 650.0000 mg | Freq: Four times a day (QID) | RECTAL | Status: DC | PRN

## 2019-02-13 MED ORDER — SODIUM CHLORIDE 0.9 % IV BOLUS
1000.0000 mL | Freq: Once | INTRAVENOUS | Status: AC
  Administered 2019-02-13: 1000 mL via INTRAVENOUS

## 2019-02-13 MED ORDER — RISAQUAD PO CAPS
ORAL_CAPSULE | Freq: Every day | ORAL | Status: DC
  Administered 2019-02-13 – 2019-02-15 (×3): 1 via ORAL
  Filled 2019-02-13 (×3): qty 1

## 2019-02-13 MED ORDER — HYDRALAZINE HCL 20 MG/ML IJ SOLN: 10.0000 mg | INTRAMUSCULAR | Status: DC | PRN

## 2019-02-13 MED ORDER — MAGNESIUM OXIDE 400 (241.3 MG) MG PO TABS
200.0000 mg | ORAL_TABLET | Freq: Two times a day (BID) | ORAL | Status: DC
  Administered 2019-02-13 – 2019-02-15 (×5): 200 mg via ORAL
  Filled 2019-02-13 (×5): qty 1

## 2019-02-13 MED ORDER — IOPAMIDOL (ISOVUE-300) INJECTION 61%
100.0000 mL | Freq: Once | INTRAVENOUS | Status: AC | PRN
  Administered 2019-02-13: 100 mL via INTRAVENOUS

## 2019-02-13 MED ORDER — SODIUM CHLORIDE (PF) 0.9 % IJ SOLN
INTRAMUSCULAR | Status: AC
  Filled 2019-02-13: qty 50

## 2019-02-13 MED ORDER — LEVALBUTEROL HCL 1.25 MG/0.5ML IN NEBU
1.2500 mg | INHALATION_SOLUTION | Freq: Two times a day (BID) | RESPIRATORY_TRACT | Status: DC
  Filled 2019-02-13: qty 0.5

## 2019-02-13 MED ORDER — ADULT MULTIVITAMIN W/MINERALS CH
1.0000 | ORAL_TABLET | Freq: Every day | ORAL | Status: DC
  Administered 2019-02-14 – 2019-02-15 (×2): 1 via ORAL
  Filled 2019-02-13 (×3): qty 1

## 2019-02-13 MED ORDER — VANCOMYCIN 50 MG/ML ORAL SOLUTION
125.0000 mg | Freq: Once | ORAL | Status: AC
  Administered 2019-02-13: 125 mg via ORAL
  Filled 2019-02-13 (×2): qty 2.5

## 2019-02-13 MED ORDER — ENOXAPARIN SODIUM 40 MG/0.4ML ~~LOC~~ SOLN
40.0000 mg | Freq: Every day | SUBCUTANEOUS | Status: DC
  Administered 2019-02-13 – 2019-02-15 (×3): 40 mg via SUBCUTANEOUS
  Filled 2019-02-13 (×3): qty 0.4

## 2019-02-13 MED ORDER — OLANZAPINE 5 MG PO TABS
15.0000 mg | ORAL_TABLET | Freq: Two times a day (BID) | ORAL | Status: DC
  Administered 2019-02-13 – 2019-02-15 (×5): 15 mg via ORAL
  Filled 2019-02-13 (×5): qty 1

## 2019-02-13 MED ORDER — MEMANTINE HCL 10 MG PO TABS
10.0000 mg | ORAL_TABLET | Freq: Two times a day (BID) | ORAL | Status: DC
  Administered 2019-02-13 – 2019-02-15 (×5): 10 mg via ORAL
  Filled 2019-02-13 (×5): qty 1

## 2019-02-13 MED ORDER — IOPAMIDOL (ISOVUE-300) INJECTION 61%
INTRAVENOUS | Status: AC
  Filled 2019-02-13: qty 100

## 2019-02-13 NOTE — Progress Notes (Signed)
Patient seen and examined at the bedside.  Patient admitted by Dr. Blaine Hamper.   Patient states she feels slightly better this morning after getting IV fluids.  Mouth feels very dry.  At the moment her nausea is very minimal therefore would like to eat something.  Urine slightly dark in color. Unable to send stool samples yet as she is not able to give any.  Advised nursing staff to advance diet, continue IV fluids.  Provide supportive care.  We will keep in the hospital for at least another 24 hours until her hydration status is improved and able to tolerate oral diet without any issues.  Her lactate still remains high but should come down with IV fluids.  Call with questions as needed.  Gerlean Ren MD  [facility]

## 2019-02-13 NOTE — ED Notes (Signed)
Patient transported to CT 

## 2019-02-13 NOTE — Progress Notes (Addendum)
CRITICAL VALUE ALERT  Critical Value:  Lactic acid - 2.8  Date & Time Notied:  02/13/2019 1015  Provider Notified: Dr. Reesa Chew  Orders Received/Actions taken: continue IV fluids

## 2019-02-13 NOTE — Progress Notes (Signed)
A consult was received from an ED physician for zosyn and vancomycin per pharmacy dosing.  The patient's profile has been reviewed for ht/wt/allergies/indication/available labs.   A one time order has been placed for Zosyn 3.375 Gm and Vancomycin 1750 mg.  Further antibiotics/pharmacy consults should be ordered by admitting physician if indicated.                       Thank you, Dorrene German 02/13/2019  12:35 AM

## 2019-02-13 NOTE — ED Notes (Signed)
ED TO INPATIENT HANDOFF REPORT  ED Nurse Name and Phone #: Christell Constant [phone]  S Name/Age/Gender [name] 78 y.o. female Room/Bed: WA23/WA23  Code Status   Code Status: Full Code  Home/SNF/Other Home Patient oriented to: self Is this baseline? Yes   Triage Complete: Triage complete  Chief Complaint N/V/Dehydration and Diarrhea  Triage Note Pt presents by [facility] from [facility] for evaluation of nausea and vomiting. Pt reports that symptoms started today and had appx 6 episodes of vomiting with diarrhea.    Allergies Allergies  Allergen Reactions  . Lithium Nausea Only  . Fluoxetine Other (See Comments)    Pt felt crazy  . Macrodantin Other (See Comments)    unknown  . Mirabegron Other (See Comments)    ineffective  . Paroxetine Hcl     Other reaction(s): Other (See Comments) Made pt feel crazy  . Paxil [Paroxetine] Other (See Comments)    Made pt feel crazy   . Prozac [Fluoxetine Hcl] Other (See Comments)    Pt felt crazy   . Sertraline Hcl   . Solifenacin Other (See Comments)    Ineffective   . Sulfa Antibiotics Other (See Comments)    unknown  . Wellbutrin [Bupropion Hcl] Other (See Comments)    unknown    Level of Care/Admitting Diagnosis ED Disposition    ED Disposition Condition Comment   Admit  Hospital Area: [facility] [[id number]]  Level of Care: Telemetry [5]  Admit to tele based on following criteria: Other see comments  Comments: sepsis and hx of CHF  Diagnosis: Nausea vomiting and diarrhea [[id number]]  Admitting Physician: Ivor Costa [[id number]]  Attending Physician: Ivor Costa [phone]  Estimated length of stay: past midnight tomorrow  Certification:: I certify this patient will need inpatient services for at least 2 midnights  PT Class (Do Not Modify): Inpatient [101]  PT Acc Code (Do Not Modify): Private [1]       B Medical/Surgery History Past Medical History:  Diagnosis Date  . Anxiety   . Arrhythmia    right  bundle branch block  . Bipolar 1 disorder ([place])   . Cancer ([place])   . Hyperlipidemia   . Hypertension   . Morbid obesity ([place])   . Osteoarthritis   . Schizo-affective schizophrenia ([place])   . Thyroid disease    hypothyroidism   Past Surgical History:  Procedure Laterality Date  . BREAST SURGERY    . CHOLECYSTECTOMY    . DILATION AND CURETTAGE OF UTERUS    . ESOPHAGEAL MANOMETRY N/A 01/29/2018   Procedure: ESOPHAGEAL MANOMETRY (EM);  Surgeon: Wonda Horner, MD;  Location: [facility];  Service: Endoscopy;  Laterality: N/A;  . HAND SURGERY     Right-trigger finger  . IR THORACENTESIS ASP PLEURAL SPACE W/IMG GUIDE  06/28/2018  . IR THORACENTESIS ASP PLEURAL SPACE W/IMG GUIDE  07/19/2018  . KNEE SURGERY     Left  . TONSILLECTOMY       A IV Location/Drains/Wounds Patient Lines/Drains/Airways Status   Active Line/Drains/Airways    Name:   Placement date:   Placement time:   Site:   Days:   Peripheral IV 02/12/19 Left Antecubital   02/12/19    2134    Antecubital   1   Peripheral IV 02/12/19 Right Forearm   02/12/19    2322    Forearm   1   Wound / Incision (Open or Dehisced) 07/24/14 Laceration Lip Lower   07/24/14    0239  Lip   1665          Intake/Output Last 24 hours  Intake/Output Summary (Last 24 hours) at 02/13/2019 0518 Last data filed at 02/13/2019 0129 Gross per 24 hour  Intake 1050 ml  Output -  Net 1050 ml    Labs/Imaging Results for orders placed or performed during the hospital encounter of 02/12/19 (from the past 48 hour(s))  CBC with Differential     Status: Abnormal   Collection Time: 02/12/19 10:51 PM  Result Value Ref Range   WBC 7.0 4.0 - 10.5 K/uL   RBC 4.85 3.87 - 5.11 MIL/uL   Hemoglobin 15.6 (H) 12.0 - 15.0 g/dL   HCT 48.4 (H) 36.0 - 46.0 %   MCV 99.8 80.0 - 100.0 fL   MCH 32.2 26.0 - 34.0 pg   MCHC 32.2 30.0 - 36.0 g/dL   RDW 13.3 11.5 - 15.5 %   Platelets 252 150 - 400 K/uL   nRBC 0.0 0.0 - 0.2 %   Neutrophils Relative % 86 %   Neutro  Abs 6.1 1.7 - 7.7 K/uL   Lymphocytes Relative 5 %   Lymphs Abs 0.3 (L) 0.7 - 4.0 K/uL   Monocytes Relative 8 %   Monocytes Absolute 0.6 0.1 - 1.0 K/uL   Eosinophils Relative 1 %   Eosinophils Absolute 0.0 0.0 - 0.5 K/uL   Basophils Relative 0 %   Basophils Absolute 0.0 0.0 - 0.1 K/uL   Immature Granulocytes 0 %   Abs Immature Granulocytes 0.02 0.00 - 0.07 K/uL    Comment: Performed at [facility], [place] [address] [place], [place] [address]  Comprehensive metabolic panel     Status: Abnormal   Collection Time: 02/12/19 10:51 PM  Result Value Ref Range   Sodium 141 135 - 145 mmol/L   Potassium 3.4 (L) 3.5 - 5.1 mmol/L   Chloride 110 98 - 111 mmol/L   CO2 20 (L) 22 - 32 mmol/L   Glucose, Bld 134 (H) 70 - 99 mg/dL   BUN 33 (H) 8 - 23 mg/dL   Creatinine, Ser 0.96 0.44 - 1.00 mg/dL   Calcium 8.9 8.9 - 10.3 mg/dL   Total Protein 6.8 6.5 - 8.1 g/dL   Albumin 3.9 3.5 - 5.0 g/dL   AST 28 15 - 41 U/L   ALT 34 0 - 44 U/L   Alkaline Phosphatase 33 (L) 38 - 126 U/L   Total Bilirubin 0.6 0.3 - 1.2 mg/dL   GFR calc non Af Amer 57 (L) >60 mL/min   GFR calc Af Amer >60 >60 mL/min   Anion gap 11 5 - 15    Comment: Performed at [facility], [place] [address], [place], [place] [address]  Magnesium     Status: None   Collection Time: 02/12/19 10:51 PM  Result Value Ref Range   Magnesium 1.8 1.7 - 2.4 mg/dL    Comment: Performed at [facility], [place] [address], [place], [place] [address]  Lactic acid, plasma     Status: Abnormal   Collection Time: 02/12/19 10:51 PM  Result Value Ref Range   Lactic Acid, Venous 3.0 (HH) 0.5 - 1.9 mmol/L    Comment: CRITICAL RESULT CALLED TO, READ BACK BY AND VERIFIED WITH: Donnetta Hail RN [id number] 02/12/2019 [place] Performed at [place] [address], [place],  [address]   Protime-INR     Status: None   Collection Time: 02/12/19 10:51 PM  Result Value Ref Range   Prothrombin Time  13.5 11.4 - 15.2 seconds   INR 1.0 0.8 - 1.2    Comment: (NOTE) INR goal varies based on device and disease states. Performed at [facility], [place] [address], [place], [place] [address]   Influenza panel by PCR (type A & B)     Status: None   Collection Time: 02/12/19 10:51 PM  Result Value Ref Range   Influenza A By PCR NEGATIVE NEGATIVE   Influenza B By PCR NEGATIVE NEGATIVE    Comment: (NOTE) The Xpert Xpress Flu assay is intended as an aid in the diagnosis of  influenza and should not be used as a sole basis for treatment.  This  assay is FDA approved for nasopharyngeal swab specimens only. Nasal  washings and aspirates are unacceptable for Xpert Xpress Flu testing. Performed at [facility], [place] [address], [place], [place] [address]   TSH     Status: None   Collection Time: 02/12/19 10:51 PM  Result Value Ref Range   TSH 3.455 0.350 - 4.500 uIU/mL    Comment: Performed by a 3rd Generation assay with a functional sensitivity of <=0.01 uIU/mL. Performed at [facility], [facility], [place] [address], [place], [place] [address]   Urinalysis, Routine w reflex microscopic     Status: None   Collection Time: 02/12/19 11:52 PM  Result Value Ref Range   Color, Urine YELLOW YELLOW   APPearance CLEAR CLEAR   Specific Gravity, Urine 1.020 1.005 - 1.030   pH 5.0 5.0 - 8.0   Glucose, UA NEGATIVE NEGATIVE mg/dL   Hgb urine dipstick NEGATIVE NEGATIVE   Bilirubin Urine NEGATIVE NEGATIVE   Ketones, ur NEGATIVE NEGATIVE mg/dL   Protein, ur NEGATIVE NEGATIVE mg/dL   Nitrite NEGATIVE NEGATIVE   Leukocytes,Ua NEGATIVE NEGATIVE    Comment: Performed at [place] [address] [place], [place] [address]  Lactic acid, plasma     Status: Abnormal   Collection Time: 02/13/19 12:41 AM  Result Value Ref Range   Lactic Acid, Venous 2.3 (HH) 0.5 - 1.9 mmol/L    Comment: CRITICAL RESULT CALLED TO, READ BACK BY AND VERIFIED  WITH: Jamas Lav RN [id number] 02/13/2019 [place] Performed at [facility], [place] [address] [place], [place] [address]    Dg Chest 2 View  Result Date: 02/12/2019 CLINICAL DATA:  Nausea and vomiting EXAM: CHEST - 2 VIEW COMPARISON:  07/19/2018 FINDINGS: Top-normal heart size. Nonaneurysmal thoracic aorta. Lungs are clear. No effusion or pneumothorax. Degenerative changes are present along the thoracic spine. IMPRESSION: No active cardiopulmonary disease. Electronically Signed   By: Ashley Royalty M.D.   On: 02/12/2019 23:38   Ct Abdomen Pelvis W Contrast  Result Date: 02/13/2019 CLINICAL DATA:  Nausea and vomiting. Abdominal pain EXAM: CT ABDOMEN AND PELVIS WITH CONTRAST TECHNIQUE: Multidetector CT imaging of the abdomen and pelvis was performed using the standard protocol following bolus administration of intravenous contrast. CONTRAST:  187mL ISOVUE-300 IOPAMIDOL (ISOVUE-300) INJECTION 61% COMPARISON:  Report from abdominal CT 07/23/2018 outside institution FINDINGS: Lower chest: Tree in bud opacities in the right middle lobe. Linear subsegmental atelectasis. Hepatobiliary: Hepatic steatosis with focal abnormality. Clips in the gallbladder fossa postcholecystectomy. No biliary dilatation. Pancreas: No ductal dilatation or inflammation. Spleen: Normal in size without focal abnormality. Adrenals/Urinary Tract: Normal adrenal glands. No hydronephrosis or perinephric edema. Homogeneous renal enhancement with symmetric excretion on delayed phase imaging. Scattered cortical hypodensities are too small  to accurately characterize but likely small cysts. Urinary bladder is physiologically distended without wall thickening. Stomach/Bowel: Prior fundoplication. Stomach physiologically distended. Fluid-filled nondilated small bowel in a nonobstructive pattern. Fluid/liquid stool in the ascending, transverse, and proximal descending colon. Formed stool in the more distal colon. No colonic wall thickening or  inflammatory change. Normal appendix. Vascular/Lymphatic: Aortic atherosclerosis without aneurysm. High-density material within the retroperitoneal, coursing along the pelvic vasculature, and in the pelvis, some of which are within lymph nodes, likely sequela of prior lymphangiography. No enlarged lymph nodes in the abdomen or pelvis. Reproductive: Uterus and bilateral adnexa are unremarkable. Other: No free air, free fluid, or intra-abdominal fluid collection. Musculoskeletal: There are no acute or suspicious osseous abnormalities. IMPRESSION: 1. Fluid-filled nondilated small bowel with liquid stool in the ascending, transverse, and proximal descending colon, consistent with diarrheal process a possible non-specific enteritis. No bowel inflammation. 2. Hepatic steatosis. 3. Tree in bud opacities in the right middle lobe may be infectious or inflammatory. Aortic Atherosclerosis (ICD10-I70.0). Electronically Signed   By: Keith Rake M.D.   On: 02/13/2019 01:35    Pending Labs Unresulted Labs (From admission, onward)    Start     Ordered   02/13/19 6767  Basic metabolic panel  Tomorrow morning,   R     02/13/19 0434   02/13/19 0500  CBC  Tomorrow morning,   R     02/13/19 0434   02/13/19 0431  Lactic acid, plasma  STAT Now then every 3 hours,   STAT     02/13/19 0430   02/13/19 0431  Procalcitonin  ONCE - STAT,   R     02/13/19 0430   02/13/19 0430  C difficile quick scan w PCR reflex  (C Difficile quick screen w PCR reflex panel)  Once, for 24 hours,   R     02/13/19 0429   02/13/19 0429  Brain natriuretic peptide  ONCE - STAT,   R     02/13/19 0429   02/12/19 2209  Culture, blood (Routine x 2)  BLOOD CULTURE X 2,   STAT     02/12/19 2208          Vitals/Pain Today's Vitals   02/13/19 0314 02/13/19 0400 02/13/19 0430 02/13/19 0500  BP: (!) 119/57 120/71 118/68 123/66  Pulse: (!) 108 (!) 113 (!) 106 100  Resp: 20 16 (!) 21 (!) 21  Temp:      TempSrc:      SpO2: 93% 94% 94% 94%   Weight:      Height:      PainSc:        Isolation Precautions Enteric precautions (UV disinfection)  Medications Medications  iopamidol (ISOVUE-300) 61 % injection (has no administration in time range)  sodium chloride (PF) 0.9 % injection (has no administration in time range)  sodium chloride 0.9 % bolus 1,000 mL (has no administration in time range)  HYDROcodone-acetaminophen (NORCO/VICODIN) 5-325 MG per tablet 1 tablet (has no administration in time range)  fenofibrate tablet 160 mg (has no administration in time range)  metoprolol tartrate (LOPRESSOR) tablet 25 mg (has no administration in time range)  ALPRAZolam (XANAX) tablet 1 mg (has no administration in time range)  donepezil (ARICEPT) tablet 10 mg (has no administration in time range)  memantine (NAMENDA) tablet 10 mg (has no administration in time range)  OLANZapine (ZYPREXA) tablet 15 mg (has no administration in time range)  QUEtiapine (SEROQUEL) tablet 25 mg (has no administration in time range)  ramelteon (  ROZEREM) tablet 8 mg (has no administration in time range)  levothyroxine (SYNTHROID, LEVOTHROID) tablet 150 mcg (has no administration in time range)  Lactobacillus TABS (has no administration in time range)  pantoprazole (PROTONIX) EC tablet 40 mg (has no administration in time range)  lamoTRIgine (LAMICTAL) tablet 150 mg (has no administration in time range)  Calcium-Magnesium 300-300 MG TABS 1 tablet (has no administration in time range)  multivitamin with minerals tablet 1 tablet (has no administration in time range)  omega-3 acid ethyl esters (LOVAZA) capsule 1 g (has no administration in time range)  potassium chloride 20 MEQ/15ML (10%) solution 40 mEq (has no administration in time range)  vancomycin (VANCOCIN) 50 mg/mL oral solution 125 mg (has no administration in time range)  enoxaparin (LOVENOX) injection 40 mg (has no administration in time range)  acetaminophen (TYLENOL) tablet 650 mg (has no  administration in time range)    Or  acetaminophen (TYLENOL) suppository 650 mg (has no administration in time range)  ondansetron (ZOFRAN) tablet 4 mg (has no administration in time range)    Or  ondansetron (ZOFRAN) injection 4 mg (has no administration in time range)  levalbuterol (XOPENEX) nebulizer solution 1.25 mg (has no administration in time range)  ondansetron (ZOFRAN) injection 4 mg (4 mg Intravenous Given 02/12/19 2310)  sodium chloride flush (NS) 0.9 % injection 3 mL (3 mLs Intravenous Given 02/12/19 2311)  sodium chloride 0.9 % bolus 1,000 mL (0 mLs Intravenous Stopped 02/13/19 0129)  acetaminophen (TYLENOL) tablet 1,000 mg (1,000 mg Oral Given 02/12/19 2305)  LORazepam (ATIVAN) injection 1 mg (1 mg Intravenous Given 02/12/19 2308)  piperacillin-tazobactam (ZOSYN) IVPB 3.375 g (0 g Intravenous Stopped 02/13/19 0129)  vancomycin (VANCOCIN) 1,750 mg in sodium chloride 0.9 % 500 mL IVPB (1,750 mg Intravenous New Bag/Given 02/13/19 0130)  iopamidol (ISOVUE-300) 61 % injection 100 mL (100 mLs Intravenous Contrast Given 02/13/19 0103)    Mobility walks with device Low fall risk   Focused Assessments Pulmonary Assessment Handoff:  Lung sounds:   O2 Device: Room Air        R Recommendations: See Admitting Provider Note  Report given to:   Additional Notes: None

## 2019-02-13 NOTE — Progress Notes (Addendum)
CRITICAL VALUE ALERT  Critical Value:  Lactic acid : 2.4   Date & Time Notied:  02/13/2019 0800  Provider Notified: Dr. Reesa Chew  Orders Received/Actions taken: NS @ 75

## 2019-02-13 NOTE — H&P (Signed)
History and Physical    [name] XFG:[id number] DOB: 1941-11-10 DOA: 02/12/2019  Referring MD/NP/PA:   PCP: [name], MD   Patient coming from:  The patient is coming from SNF.  At baseline, pt is dependent for most of ADL.        Chief Complaint: Nausea, vomiting, diarrhea  HPI: [name] is a 78 y.o. female with medical history significant of Dementia, Sepsis, HLD (hyperlipidemia), Anxiety, Schizo-affective schizophrenia, dCHF, GERD, Bipolar 1 disorder, who presents with nausea vomiting, diarrhea.  Patient states that her symptoms started today, including nausea, vomiting, diarrhea.  She has had at least 6 times of nonbloody and non-biliary vomiting, and 6-7 times of watery diarrhea.  Denies abdominal pain.  She states that she used Keflex for UTI months ago.  Not using laxatives.  She states that she feels anxious, therefore has minimal shortness of breath, denies cough, runny nose or sore throat.  She also has a fever and chills.  Her temperature is 102.2 in ED.  Denies symptoms of UTI or unilateral weakness.  ED Course: pt was found to have WBC 7.0, lactic acid 3.0, 2.3, INR 1.0, negative flu PCR, negative urinalysis, potassium 3.4, renal function normal, temperature 102.2, tachycardia, tachypnea, oxygen saturation 93 to 99% on room air.  Chest x-ray negative.  CT abdomen/pelvis is consistent with enteritis, but also showed right middle lobe tree-in-bud obesity.  Patient is admitted to telemetry bed as inpatient.  Review of Systems:   General: has fevers, chills, no body weight gain, has poor appetite, has fatigue HEENT: no blurry vision, hearing changes or sore throat Respiratory: has mild dyspnea, no coughing, wheezing CV: no chest pain, no palpitations GI: has nausea, vomiting, diarrhea, no constipation, abdominal pain,  GU: no dysuria, burning on urination, increased urinary frequency, hematuria  Ext: has mild leg edema Neuro: no unilateral weakness, numbness, or  tingling, no vision change or hearing loss Skin: no rash, no skin tear. MSK: No muscle spasm, no deformity, no limitation of range of movement in spin Heme: No easy bruising.  Travel history: No recent long distant travel.  Allergy:  Allergies  Allergen Reactions  . Lithium Nausea Only  . Fluoxetine Other (See Comments)    Pt felt crazy  . Macrodantin Other (See Comments)    unknown  . Mirabegron Other (See Comments)    ineffective  . Paroxetine Hcl     Other reaction(s): Other (See Comments) Made pt feel crazy  . Paxil [Paroxetine] Other (See Comments)    Made pt feel crazy   . Prozac [Fluoxetine Hcl] Other (See Comments)    Pt felt crazy   . Sertraline Hcl   . Solifenacin Other (See Comments)    Ineffective   . Sulfa Antibiotics Other (See Comments)    unknown  . Wellbutrin [Bupropion Hcl] Other (See Comments)    unknown    Past Medical History:  Diagnosis Date  . Anxiety   . Arrhythmia    right bundle branch block  . Bipolar 1 disorder ([place])   . Cancer ([place])   . Hyperlipidemia   . Hypertension   . Morbid obesity ([place])   . Osteoarthritis   . Schizo-affective schizophrenia ([place])   . Thyroid disease    hypothyroidism    Past Surgical History:  Procedure Laterality Date  . BREAST SURGERY    . CHOLECYSTECTOMY    . DILATION AND CURETTAGE OF UTERUS    . ESOPHAGEAL MANOMETRY N/A 01/29/2018   Procedure: ESOPHAGEAL MANOMETRY (  EM);  Surgeon: Wonda Horner, MD;  Location: [facility];  Service: Endoscopy;  Laterality: N/A;  . HAND SURGERY     Right-trigger finger  . IR THORACENTESIS ASP PLEURAL SPACE W/IMG GUIDE  06/28/2018  . IR THORACENTESIS ASP PLEURAL SPACE W/IMG GUIDE  07/19/2018  . KNEE SURGERY     Left  . TONSILLECTOMY      Social History:  reports that she quit smoking about 40 years ago. She has never used smokeless tobacco. She reports current alcohol use. She reports that she does not use drugs.  Family History:  Family History  Problem Relation  Age of Onset  . Hypertension Mother      Prior to Admission medications   Medication Sig Start Date End Date Taking? Authorizing Provider  acetaminophen (TYLENOL) 500 MG tablet Take 500 mg by mouth every 12 (twelve) hours as needed for mild pain, moderate pain, fever or headache.   Yes [provider]  ALPRAZolam Duanne Moron) 0.5 MG tablet Take 1 mg by mouth 4 (four) times daily as needed for anxiety.    Yes [provider]  CALCIUM MAGNESIUM 750 PO Take 1 tablet by mouth 2 (two) times daily.   Yes [provider]  donepezil (ARICEPT) 10 MG tablet Take 10 mg by mouth every morning.    Yes [provider]  fenofibrate (TRICOR) 145 MG tablet TAKE 1 TABLET EVERY TUESDAY,THURSDAY,AND SATURDAY AT 6 PM Patient taking differently: Take 145 mg by mouth See admin instructions.  10/25/18  Yes Skeet Latch, MD  HYDROcodone-acetaminophen (NORCO/VICODIN) 5-325 MG tablet Take 1 tablet by mouth daily as needed for pain.   Yes [provider]  LACTOBACILLUS PO Take 1 capsule by mouth daily.   Yes [provider]  lamoTRIgine (LAMICTAL) 150 MG tablet Take 150 mg by mouth daily.  04/06/16  Yes [provider]  memantine (NAMENDA) 10 MG tablet Take 10 mg by mouth 2 (two) times daily.     Yes [provider]  metoprolol tartrate (LOPRESSOR) 25 MG tablet TAKE 1 TABLET BY MOUTH EVERY DAY Patient taking differently: Take 25 mg by mouth 2 (two) times daily.  12/29/18  Yes Kilroy, Luke K, PA-C  Multiple Vitamins-Minerals (MULTIVITAMIN ADULT PO) Take 1 tablet by mouth daily.   Yes [provider]  OLANZapine (ZYPREXA) 15 MG tablet Take 15 mg by mouth 2 (two) times daily.    Yes [provider]  Omega-3 Fatty Acids (FISH OIL) 1000 MG CAPS Take 1,000 mg by mouth daily.   Yes [provider]  omeprazole (PRILOSEC) 40 MG capsule Take 40 mg by mouth daily.   Yes [provider]  QUEtiapine (SEROQUEL) 25 MG tablet Take 25  mg by mouth 3 (three) times daily.    Yes [provider]  ramelteon (ROZEREM) 8 MG tablet Take 8 mg by mouth at bedtime as needed for sleep.    Yes [provider]  spironolactone (ALDACTONE) 25 MG tablet TAKE 1 TABLET BY MOUTH DAILY Patient taking differently: Take 25 mg by mouth daily.  01/24/19  Yes Gerhardt, Marlane Hatcher, NP  SYNTHROID 150 MCG tablet TAKE 1 TABLET (150 MCG TOTAL) BY MOUTH DAILY. Patient taking differently: Take 150 mcg by mouth daily before breakfast.  10/23/15  Yes Darlin Coco, MD    Physical Exam: Vitals:   02/13/19 0037 02/13/19 0130 02/13/19 0314 02/13/19 0400  BP:  134/66 (!) 119/57 120/71  Pulse:  (!) 111 (!) 108 (!) 113  Resp:  16  20 16  Temp: 100.2 F (37.9 C)     TempSrc: Oral     SpO2:  93% 93% 94%  Weight:      Height:       General: Not in acute distress HEENT:       Eyes: PERRL, EOMI, no scleral icterus.       ENT: No discharge from the ears and nose, no pharynx injection, no tonsillar enlargement.        Neck: No JVD, no bruit, no mass felt. Heme: No neck lymph node enlargement. Cardiac: S1/S2, RRR, No murmurs, No gallops or rubs. Respiratory: No rales, wheezing, rhonchi or rubs. GI: Soft, nondistended, nontender, no rebound pain, no organomegaly, BS present. GU: No hematuria Ext: has trace leg edema bilaterally. 2+DP/PT pulse bilaterally. Musculoskeletal: No joint deformities, No joint redness or warmth, no limitation of ROM in spin.  Skin: No rashes.  Neuro: Alert, oriented X3, cranial nerves II-XII grossly intact, moves all extremities normally.  Psych: Patient is not psychotic, no suicidal or hemocidal ideation.  Labs on Admission: I have personally reviewed following labs and imaging studies  CBC: Recent Labs  Lab 02/12/19 2251  WBC 7.0  NEUTROABS 6.1  HGB 15.6*  HCT 48.4*  MCV 99.8  PLT 938   Basic Metabolic Panel: Recent Labs  Lab 02/12/19 2251  NA 141  K 3.4*  CL 110  CO2 20*  GLUCOSE 134*  BUN 33*   CREATININE 0.96  CALCIUM 8.9  MG 1.8   GFR: Estimated Creatinine Clearance: 47.1 mL/min (by C-G formula based on SCr of 0.96 mg/dL). Liver Function Tests: Recent Labs  Lab 02/12/19 2251  AST 28  ALT 34  ALKPHOS 33*  BILITOT 0.6  PROT 6.8  ALBUMIN 3.9   No results for input(s): LIPASE, AMYLASE in the last 168 hours. No results for input(s): AMMONIA in the last 168 hours. Coagulation Profile: Recent Labs  Lab 02/12/19 2251  INR 1.0   Cardiac Enzymes: No results for input(s): CKTOTAL, CKMB, CKMBINDEX, TROPONINI in the last 168 hours. BNP (last 3 results) No results for input(s): PROBNP in the last 8760 hours. HbA1C: No results for input(s): HGBA1C in the last 72 hours. CBG: No results for input(s): GLUCAP in the last 168 hours. Lipid Profile: No results for input(s): CHOL, HDL, LDLCALC, TRIG, CHOLHDL, LDLDIRECT in the last 72 hours. Thyroid Function Tests: Recent Labs    02/12/19 2251  TSH 3.455   Anemia Panel: No results for input(s): VITAMINB12, FOLATE, FERRITIN, TIBC, IRON, RETICCTPCT in the last 72 hours. Urine analysis:    Component Value Date/Time   COLORURINE YELLOW 02/12/2019 2352   APPEARANCEUR CLEAR 02/12/2019 2352   LABSPEC 1.020 02/12/2019 2352   PHURINE 5.0 02/12/2019 2352   GLUCOSEU NEGATIVE 02/12/2019 2352   HGBUR NEGATIVE 02/12/2019 2352   BILIRUBINUR NEGATIVE 02/12/2019 2352   KETONESUR NEGATIVE 02/12/2019 2352   PROTEINUR NEGATIVE 02/12/2019 2352   NITRITE NEGATIVE 02/12/2019 2352   LEUKOCYTESUR NEGATIVE 02/12/2019 2352   Sepsis Labs: @LABRCNTIP (procalcitonin:4,lacticidven:4) )No results found for this or any previous visit (from the past 240 hour(s)).   Radiological Exams on Admission: Dg Chest 2 View  Result Date: 02/12/2019 CLINICAL DATA:  Nausea and vomiting EXAM: CHEST - 2 VIEW COMPARISON:  07/19/2018 FINDINGS: Top-normal heart size. Nonaneurysmal thoracic aorta. Lungs are clear. No effusion or pneumothorax. Degenerative changes  are present along the thoracic spine. IMPRESSION: No active cardiopulmonary disease. Electronically Signed   By: Ashley Royalty M.D.   On: 02/12/2019 23:38  Ct Abdomen Pelvis W Contrast  Result Date: 02/13/2019 CLINICAL DATA:  Nausea and vomiting. Abdominal pain EXAM: CT ABDOMEN AND PELVIS WITH CONTRAST TECHNIQUE: Multidetector CT imaging of the abdomen and pelvis was performed using the standard protocol following bolus administration of intravenous contrast. CONTRAST:  132mL ISOVUE-300 IOPAMIDOL (ISOVUE-300) INJECTION 61% COMPARISON:  Report from abdominal CT 07/23/2018 outside institution FINDINGS: Lower chest: Tree in bud opacities in the right middle lobe. Linear subsegmental atelectasis. Hepatobiliary: Hepatic steatosis with focal abnormality. Clips in the gallbladder fossa postcholecystectomy. No biliary dilatation. Pancreas: No ductal dilatation or inflammation. Spleen: Normal in size without focal abnormality. Adrenals/Urinary Tract: Normal adrenal glands. No hydronephrosis or perinephric edema. Homogeneous renal enhancement with symmetric excretion on delayed phase imaging. Scattered cortical hypodensities are too small to accurately characterize but likely small cysts. Urinary bladder is physiologically distended without wall thickening. Stomach/Bowel: Prior fundoplication. Stomach physiologically distended. Fluid-filled nondilated small bowel in a nonobstructive pattern. Fluid/liquid stool in the ascending, transverse, and proximal descending colon. Formed stool in the more distal colon. No colonic wall thickening or inflammatory change. Normal appendix. Vascular/Lymphatic: Aortic atherosclerosis without aneurysm. High-density material within the retroperitoneal, coursing along the pelvic vasculature, and in the pelvis, some of which are within lymph nodes, likely sequela of prior lymphangiography. No enlarged lymph nodes in the abdomen or pelvis. Reproductive: Uterus and bilateral adnexa are  unremarkable. Other: No free air, free fluid, or intra-abdominal fluid collection. Musculoskeletal: There are no acute or suspicious osseous abnormalities. IMPRESSION: 1. Fluid-filled nondilated small bowel with liquid stool in the ascending, transverse, and proximal descending colon, consistent with diarrheal process a possible non-specific enteritis. No bowel inflammation. 2. Hepatic steatosis. 3. Tree in bud opacities in the right middle lobe may be infectious or inflammatory. Aortic Atherosclerosis (ICD10-I70.0). Electronically Signed   By: Keith Rake M.D.   On: 02/13/2019 01:35     EKG: not done yet in ED, will get one   Assessment/Plan Principal Problem:   Nausea vomiting and diarrhea Active Problems:   Dementia (HCC)   Sepsis (HCC)   HLD (hyperlipidemia)   Anxiety   Schizo-affective schizophrenia (HCC)   Chronic diastolic CHF (congestive heart failure) (HCC)   GERD (gastroesophageal reflux disease)   Bipolar 1 disorder (HCC)   Nausea vomiting and diarrhea: Etiology is not clear.  CT abdomen/pelvis finding is consistent with enteritis, possibly due to viral gastro-enteritis.  Since patient is septic, C. difficile colitis needs to be ruled out.  -will admit to telemetry bed as inpatient -will give one dose of oral Vancomycin and pending c diff pcr -Zofran for nausea and vomiting -IV fluids  Sepsis: Patient meets criteria for sepsis with fever, tachycardia and tachypnea.  Lactic acid is elevated at 3.0, 2.3.  Currently hemodynamically stable.  Likely due to viral gastroenteritis, pending C. difficile PCR.  CT abdomen/pelvis that showed tree-in-bud opacity in the right middle lobe, however patient does not have respiratory symptoms. She states that she has minimal shortness of breath due to anxiety.  No cough.  Flu PCR negative.  Clinically not consistent with pneumonia.  Patient received 1 dose of vancomycin and Zosyn in ED, will not continue these antibiotics. -Follow-up of  blood culture -Patient is given 1 dose of oral vancomycin and pending C. difficile PCR as above -will get Procalcitonin and trend lactic acid levels per sepsis protocol. -IVF: 2L of NS bolus )patient has a congestive heart failure, limiting aggressive IV fluids treatment).  Dementia: no behavioral disturbance -Namenda, donepezil  HLD (hyperlipidemia): -Tricor  Anxiety,  Schizo-affective schizophrenia ([place]) and  Bipolar 1 disorder ([place]): -Continue Xanax, Lamictal, olanzapine, Seroquel  Hypothyroidism: Last TSH was on 3.455 on 03/04/2019 -Continue home Synthroid  Chronic diastolic CHF (congestive heart failure) ([place]): 2D echo on 06/28/2018 showed EF 75-80% with grade 1 diastolic dysfunction.  Patient has a trace leg edema, no respiratory distress.  No pulmonary edema chest x-ray.  CHF seem to be compensated. -Hold spironolactone due to sepsis - Continue metoprolol  GERD (gastroesophageal reflux disease): Protonix    Inpatient status:  # Patient requires inpatient status due to high intensity of service, high risk for further deterioration and high frequency of surveillance required.  I certify that at the point of admission it is my clinical judgment that the patient will require inpatient hospital care spanning beyond 2 midnights from the point of admission.  . This patient has multiple chronic comorbidities including Dementia, Sepsis, HLD (hyperlipidemia), Anxiety, Schizo-affective schizophrenia, dCHF, GERD, Bipolar 1 disorder. . Now patient has presenting with nausea, vomiting, diarrhea, fever and chills.  Meets criteria for sepsis. . The initial radiographic and laboratory data are worrisome because of evidence consistent with enteritis by CT scanning of abdomen/pelvis, hypokalemia, sepsis. . Current medical needs: please see my assessment and plan . Predictability of an adverse outcome (risk): Patient has multiple comorbidities, now presents with gastroenteritis and sepsis. Will  need to rule out C. difficile colitis.  Due to multiple comorbidities, patient is at high risk of deteriorating.  Patient will need to be treated in hospital for at least 2 days.    DVT ppx:  SQ Lovenox Code Status: Full code Family Communication: None at bed side.   Disposition Plan:  Anticipate discharge back to previous SNF Consults called:  none Admission status:   Inpatient/tele    Date of Service 02/13/2019    [facility]   If 7PM-7AM, please contact night-coverage [email or web address] Password [facility] 02/13/2019, 4:43 AM

## 2019-02-14 LAB — GASTROINTESTINAL PANEL BY PCR, STOOL (REPLACES STOOL CULTURE)
Adenovirus F40/41: NOT DETECTED
Astrovirus: NOT DETECTED
CRYPTOSPORIDIUM: NOT DETECTED
Campylobacter species: NOT DETECTED
Cyclospora cayetanensis: NOT DETECTED
Entamoeba histolytica: NOT DETECTED
Enteroaggregative E coli (EAEC): NOT DETECTED
Enteropathogenic E coli (EPEC): NOT DETECTED
Enterotoxigenic E coli (ETEC): NOT DETECTED
Giardia lamblia: NOT DETECTED
Norovirus GI/GII: DETECTED — AB
Plesimonas shigelloides: NOT DETECTED
Rotavirus A: NOT DETECTED
Salmonella species: NOT DETECTED
Sapovirus (I, II, IV, and V): NOT DETECTED
Shiga like toxin producing E coli (STEC): NOT DETECTED
Shigella/Enteroinvasive E coli (EIEC): NOT DETECTED
Vibrio cholerae: NOT DETECTED
Vibrio species: NOT DETECTED
Yersinia enterocolitica: NOT DETECTED

## 2019-02-14 LAB — COMPREHENSIVE METABOLIC PANEL
ALBUMIN: 2.7 g/dL — AB (ref 3.5–5.0)
ALT: 28 U/L (ref 0–44)
ANION GAP: 6 (ref 5–15)
AST: 35 U/L (ref 15–41)
Alkaline Phosphatase: 25 U/L — ABNORMAL LOW (ref 38–126)
BUN: 11 mg/dL (ref 8–23)
CHLORIDE: 115 mmol/L — AB (ref 98–111)
CO2: 18 mmol/L — ABNORMAL LOW (ref 22–32)
Calcium: 8.2 mg/dL — ABNORMAL LOW (ref 8.9–10.3)
Creatinine, Ser: 0.65 mg/dL (ref 0.44–1.00)
GFR calc Af Amer: >60 mL/min (ref >60)
GFR calc non Af Amer: >60 mL/min (ref >60)
Glucose, Bld: 112 mg/dL — ABNORMAL HIGH (ref 70–99)
Potassium: 2.9 mmol/L — ABNORMAL LOW (ref 3.5–5.1)
Sodium: 139 mmol/L (ref 135–145)
Total Bilirubin: 0.5 mg/dL (ref 0.3–1.2)
Total Protein: 5.5 g/dL — ABNORMAL LOW (ref 6.5–8.1)

## 2019-02-14 LAB — CBC
HEMATOCRIT: 40.7 % (ref 36.0–46.0)
Hemoglobin: 12.6 g/dL (ref 12.0–15.0)
MCH: 32.1 pg (ref 26.0–34.0)
MCHC: 31 g/dL (ref 30.0–36.0)
MCV: 103.6 fL — ABNORMAL HIGH (ref 80.0–100.0)
Platelets: 193 10*3/uL (ref 150–400)
RBC: 3.93 MIL/uL (ref 3.87–5.11)
RDW: 13.9 % (ref 11.5–15.5)
WBC: 6.4 10*3/uL (ref 4.0–10.5)
nRBC: 0 % (ref 0.0–0.2)

## 2019-02-14 LAB — MAGNESIUM: Magnesium: 2 mg/dL (ref 1.7–2.4)

## 2019-02-14 LAB — BASIC METABOLIC PANEL
ANION GAP: 6 (ref 5–15)
BUN: 8 mg/dL (ref 8–23)
CO2: 21 mmol/L — ABNORMAL LOW (ref 22–32)
Calcium: 8.2 mg/dL — ABNORMAL LOW (ref 8.9–10.3)
Chloride: 113 mmol/L — ABNORMAL HIGH (ref 98–111)
Creatinine, Ser: 0.63 mg/dL (ref 0.44–1.00)
GFR calc Af Amer: >60 mL/min (ref >60)
GFR calc non Af Amer: >60 mL/min (ref >60)
Glucose, Bld: 112 mg/dL — ABNORMAL HIGH (ref 70–99)
Potassium: 3.4 mmol/L — ABNORMAL LOW (ref 3.5–5.1)
Sodium: 140 mmol/L (ref 135–145)

## 2019-02-14 MED ORDER — DEXTROSE-NACL 5-0.45 % IV SOLN
INTRAVENOUS | Status: AC
  Administered 2019-02-14 – 2019-02-15 (×2): via INTRAVENOUS

## 2019-02-14 MED ORDER — POTASSIUM CHLORIDE 10 MEQ/100ML IV SOLN
10.0000 meq | INTRAVENOUS | Status: AC
  Administered 2019-02-14 (×4): 10 meq via INTRAVENOUS
  Filled 2019-02-14 (×4): qty 100

## 2019-02-14 MED ORDER — POTASSIUM CHLORIDE 10 MEQ/100ML IV SOLN
10.0000 meq | INTRAVENOUS | Status: AC
  Administered 2019-02-14 (×5): 10 meq via INTRAVENOUS
  Filled 2019-02-14 (×5): qty 100

## 2019-02-14 MED ORDER — POTASSIUM CHLORIDE CRYS ER 20 MEQ PO TBCR
40.0000 meq | EXTENDED_RELEASE_TABLET | Freq: Once | ORAL | Status: AC
  Administered 2019-02-14: 40 meq via ORAL
  Filled 2019-02-14: qty 2

## 2019-02-14 NOTE — Progress Notes (Signed)
PROGRESS NOTE    [name]  BMW:[id number] DOB: 08-02-1941 DOA: 02/12/2019 PCP: [name], MD   Brief Narrative:     Assessment & Plan:   Principal Problem:   Nausea vomiting and diarrhea Active Problems:   Dementia ([place])   Sepsis ([place])   HLD (hyperlipidemia)   Anxiety   Schizo-affective schizophrenia ([place])   Chronic diastolic CHF (congestive heart failure) (HCC)   GERD (gastroesophageal reflux disease)   Bipolar 1 disorder (HCC)  Watery diarrhea secondary to gastroenteritis, likely viral Poor oral intake -CT of the abdomen pelvis consistent with enteritis, C. difficile is negative -Hold off on antibiotics. -Follow-up GI panel -Antiemetics PRN, IV fluids due to poor oral intake.  Monitor input and output.  D5 half-normal saline 75 cc/h  Hypokalemia -Secondary to GI losses.  Aggressive repletion ordered.  History of dementia without any behavioral disturbances -Resume home Namenda and donepezil  Hypothyroidism -Continue Synthroid  History of anxiety/schizoaffective disorder, bipolar 1 disorder -Resume home medications including Xanax, olanzapine, Seroquel and Lamictal  Chronic diastolic congestive heart failure, compensated -Last echocardiogram showed ejection fraction 70% with grade 1 diastolic dysfunction in July 2019.  No obvious evidence of fluid overload.  GERD -PPI  DVT prophylaxis: Lovenox  Code Status: Full Code  Family Communication:  None at Bedside  Disposition Plan: Maintain hosp Stay until Diarrhea resolves, Appetite and diarrhea improved.   Consultants:   None none  Procedures:   None  Antimicrobials:   None   Subjective: Had lots of diarrhea overnight which was mostly watery.  Multiple episodes.  Slightly poor oral intake as she feels nauseous.  Remains afebrile.  Feels weak overall.  Review of Systems Otherwise negative except as per HPI, including: General: Denies fever, chills, night sweats or unintended weight  loss. Resp: Denies cough, wheezing, shortness of breath. Cardiac: Denies chest pain, palpitations, orthopnea, paroxysmal nocturnal dyspnea. GI: Denies constipation GU: Denies dysuria, frequency, hesitancy or incontinence MS: Denies muscle aches, joint pain or swelling Neuro: Denies headache, neurologic deficits (focal weakness, numbness, tingling), abnormal gait Psych: Denies anxiety, depression, SI/HI/AVH Skin: Denies new rashes or lesions ID: Denies sick contacts, exotic exposures, travel  Objective: Vitals:   02/13/19 0730 02/13/19 1321 02/13/19 2139 02/14/19 0511  BP:  (!) 149/53 (!) 139/53 (!) 127/50  Pulse:  79 79 66  Resp:  16 18 18   Temp:  99.4 F (37.4 C) 99.1 F (37.3 C) 98.6 F (37 C)  TempSrc:  Oral Oral Oral  SpO2: 95% 96% 93% 96%  Weight:      Height:        Intake/Output Summary (Last 24 hours) at 02/14/2019 1030 Last data filed at 02/14/2019 0600 Gross per 24 hour  Intake 1993.76 ml  Output 800 ml  Net 1193.76 ml   Filed Weights   02/12/19 2206 02/13/19 0554  Weight: 79.4 kg 81.1 kg    Examination:  General exam: Appears calm and comfortable, dry mouth Respiratory system: Clear to auscultation. Respiratory effort normal. Cardiovascular system: S1 & S2 heard, RRR. No JVD, murmurs, rubs, gallops or clicks. No pedal edema. Gastrointestinal system: Abdomen is nondistended, soft and nontender. No organomegaly or masses felt. Normal bowel sounds heard. Central nervous system: Alert and oriented. No focal neurological deficits. Extremities: Symmetric 4+ x 5 power. Skin: No rashes, lesions or ulcers Psychiatry: Judgement and insight appear normal. Mood & affect appropriate.  Flexi-Seal in place   Data Reviewed:   CBC: Recent Labs  Lab 02/12/19 2251 02/13/19 0640 02/14/19 2725  WBC 7.0 8.7 6.4  NEUTROABS 6.1  --   --   HGB 15.6* 13.6 12.6  HCT 48.4* 42.4 40.7  MCV 99.8 100.2* 103.6*  PLT 252 243 846   Basic Metabolic Panel: Recent Labs  Lab  02/12/19 2251 02/13/19 0640 02/14/19 0519  NA 141 139 139  K 3.4* 3.2* 2.9*  CL 110 110 115*  CO2 20* 20* 18*  GLUCOSE 134* 119* 112*  BUN 33* 28* 11  CREATININE 0.96 0.88 0.65  CALCIUM 8.9 7.5* 8.2*  MG 1.8  --  2.0   GFR: Estimated Creatinine Clearance: 57.2 mL/min (by C-G formula based on SCr of 0.65 mg/dL). Liver Function Tests: Recent Labs  Lab 02/12/19 2251 02/14/19 0519  AST 28 35  ALT 34 28  ALKPHOS 33* 25*  BILITOT 0.6 0.5  PROT 6.8 5.5*  ALBUMIN 3.9 2.7*   No results for input(s): LIPASE, AMYLASE in the last 168 hours. No results for input(s): AMMONIA in the last 168 hours. Coagulation Profile: Recent Labs  Lab 02/12/19 2251  INR 1.0   Cardiac Enzymes: No results for input(s): CKTOTAL, CKMB, CKMBINDEX, TROPONINI in the last 168 hours. BNP (last 3 results) No results for input(s): PROBNP in the last 8760 hours. HbA1C: No results for input(s): HGBA1C in the last 72 hours. CBG: No results for input(s): GLUCAP in the last 168 hours. Lipid Profile: No results for input(s): CHOL, HDL, LDLCALC, TRIG, CHOLHDL, LDLDIRECT in the last 72 hours. Thyroid Function Tests: Recent Labs    02/12/19 2251  TSH 3.455   Anemia Panel: No results for input(s): VITAMINB12, FOLATE, FERRITIN, TIBC, IRON, RETICCTPCT in the last 72 hours. Sepsis Labs: Recent Labs  Lab 02/12/19 2251 02/13/19 0041 02/13/19 0640 02/13/19 0916  PROCALCITON  --   --  2.49  --   LATICACIDVEN 3.0* 2.3* 2.4* 2.8*    Recent Results (from the past 240 hour(s))  Culture, blood (Routine x 2)     Status: None (Preliminary result)   Collection Time: 02/12/19 10:51 PM  Result Value Ref Range Status   Specimen Description   Final    BLOOD LEFT ARM Performed at [facility] [address] [place], [place] [address]    Special Requests   Final    BOTTLES DRAWN AEROBIC AND ANAEROBIC Blood Culture adequate volume Performed at [facility] [address], [place], [place] [address]    Culture   Final    NO GROWTH 1 DAY Performed at [facility], [place] [address] [place], [place] [address]    Report Status PENDING  Incomplete  Culture, blood (Routine x 2)     Status: None (Preliminary result)   Collection Time: 02/12/19 11:28 PM  Result Value Ref Range Status   Specimen Description   Final    BLOOD RIGHT FOREARM Performed at [facility] [address] [place], [place] [address]    Special Requests   Final    BOTTLES DRAWN AEROBIC AND ANAEROBIC Blood Culture adequate volume Performed at [facility] [address], [place], [place] [address]    Culture   Final    NO GROWTH 1 DAY Performed at [facility], [place] [address], [place], [place] [address]    Report Status PENDING  Incomplete  MRSA PCR Screening     Status: None   Collection Time: 02/13/19  9:10 AM  Result Value Ref Range Status   MRSA by PCR NEGATIVE NEGATIVE  Final    Comment:        The GeneXpert MRSA Assay (FDA approved for NASAL specimens only), is one component of a comprehensive MRSA colonization surveillance program. It is not intended to diagnose MRSA infection nor to guide or monitor treatment for MRSA infections. Performed at [facility], [place] [address] [place], [place] [address]   C difficile quick scan w PCR reflex     Status: None   Collection Time: 02/13/19  9:53 AM  Result Value Ref Range Status   C Diff antigen NEGATIVE NEGATIVE Final   C Diff toxin NEGATIVE NEGATIVE Final   C Diff interpretation No C. difficile detected.  Final    Comment: Performed at [facility], [place] [address], [place], [place] [address]         Radiology Studies: Dg Chest 2 View  Result Date: 02/12/2019 CLINICAL DATA:  Nausea and vomiting EXAM: CHEST - 2 VIEW COMPARISON:  07/19/2018 FINDINGS: Top-normal heart size. Nonaneurysmal thoracic aorta. Lungs are clear. No effusion or pneumothorax.  Degenerative changes are present along the thoracic spine. IMPRESSION: No active cardiopulmonary disease. Electronically Signed   By: Ashley Royalty M.D.   On: 02/12/2019 23:38   Ct Abdomen Pelvis W Contrast  Result Date: 02/13/2019 CLINICAL DATA:  Nausea and vomiting. Abdominal pain EXAM: CT ABDOMEN AND PELVIS WITH CONTRAST TECHNIQUE: Multidetector CT imaging of the abdomen and pelvis was performed using the standard protocol following bolus administration of intravenous contrast. CONTRAST:  138mL ISOVUE-300 IOPAMIDOL (ISOVUE-300) INJECTION 61% COMPARISON:  Report from abdominal CT 07/23/2018 outside institution FINDINGS: Lower chest: Tree in bud opacities in the right middle lobe. Linear subsegmental atelectasis. Hepatobiliary: Hepatic steatosis with focal abnormality. Clips in the gallbladder fossa postcholecystectomy. No biliary dilatation. Pancreas: No ductal dilatation or inflammation. Spleen: Normal in size without focal abnormality. Adrenals/Urinary Tract: Normal adrenal glands. No hydronephrosis or perinephric edema. Homogeneous renal enhancement with symmetric excretion on delayed phase imaging. Scattered cortical hypodensities are too small to accurately characterize but likely small cysts. Urinary bladder is physiologically distended without wall thickening. Stomach/Bowel: Prior fundoplication. Stomach physiologically distended. Fluid-filled nondilated small bowel in a nonobstructive pattern. Fluid/liquid stool in the ascending, transverse, and proximal descending colon. Formed stool in the more distal colon. No colonic wall thickening or inflammatory change. Normal appendix. Vascular/Lymphatic: Aortic atherosclerosis without aneurysm. High-density material within the retroperitoneal, coursing along the pelvic vasculature, and in the pelvis, some of which are within lymph nodes, likely sequela of prior lymphangiography. No enlarged lymph nodes in the abdomen or pelvis. Reproductive: Uterus and bilateral  adnexa are unremarkable. Other: No free air, free fluid, or intra-abdominal fluid collection. Musculoskeletal: There are no acute or suspicious osseous abnormalities. IMPRESSION: 1. Fluid-filled nondilated small bowel with liquid stool in the ascending, transverse, and proximal descending colon, consistent with diarrheal process a possible non-specific enteritis. No bowel inflammation. 2. Hepatic steatosis. 3. Tree in bud opacities in the right middle lobe may be infectious or inflammatory. Aortic Atherosclerosis (ICD10-I70.0). Electronically Signed   By: Keith Rake M.D.   On: 02/13/2019 01:35        Scheduled Meds: . acidophilus   Oral Daily  . calcium carbonate  1 tablet Oral BID  . donepezil  10 mg Oral q morning - 10a  . enoxaparin (LOVENOX) injection  40 mg Subcutaneous Daily  . fenofibrate  160 mg Oral Daily  . lamoTRIgine  150 mg Oral Daily  . levothyroxine  150 mcg Oral QAC breakfast  . magnesium  oxide  200 mg Oral BID  . memantine  10 mg Oral BID  . metoprolol tartrate  25 mg Oral BID  . multivitamin with minerals  1 tablet Oral Daily  . OLANZapine  15 mg Oral BID  . omega-3 acid ethyl esters  1 g Oral Daily  . pantoprazole  40 mg Oral Daily  . QUEtiapine  25 mg Oral TID   Continuous Infusions: . sodium chloride 75 mL/hr at 02/14/19 0005  . potassium chloride 10 mEq (02/14/19 0948)     LOS: 1 day   Time spent= 35 mins    Snyder Colavito Arsenio Loader, MD [facility]  If 7PM-7AM, please contact night-coverage [email or web address] 02/14/2019, 10:30 AM

## 2019-02-14 NOTE — Progress Notes (Signed)
OT Cancellation Note  Patient Details Name: [name] MRN: [id number] DOB: 29-May-1941   Cancelled Treatment:    Reason Eval/Treat Not Completed: Fatigue/lethargy limiting ability to participate  Kari Baars, [place] [phone] Office- [phone], Thereasa Parkin 02/14/2019, 4:45 PM

## 2019-02-14 NOTE — Progress Notes (Signed)
CRITICAL VALUE ALERT  Critical Value:  GI panel (+) Norovirus G1, G2  Date & Time Notied:  02/14/2019 1500  Provider Notified: Dr Reesa Chew  Orders Received/Actions taken:  No new orders

## 2019-02-14 NOTE — Evaluation (Signed)
Physical Therapy Evaluation Patient Details Name: [name] MRN: [id number] DOB: 01-Aug-1941 Today's Date: 02/14/2019   History of Present Illness  Pt is a 78 year old female presenting from [facility] with nausea, vomiting, diarrhea secondary to gastroenteritis. Past medical history significant of Dementia, Sepsis, HLD (hyperlipidemia), Anxiety, Schizo-affective schizophrenia, dCHF, GERD, Bipolar 1 disorder  Clinical Impression  Pt admitted with above diagnosis. Pt currently with functional limitations due to the deficits listed below (see PT Problem List).  Pt will benefit from skilled PT to increase their independence and safety with mobility to allow discharge to the venue listed below.  Pt agreeable to OOB to recliner today however felt too weak to ambulate at this time.  Pt reports her facility had started bringing meal trays to resident rooms due to increased illnesses at facility.     Follow Up Recommendations Home health PT;Supervision - Intermittent    Equipment Recommendations  None recommended by PT    Recommendations for Other Services       Precautions / Restrictions Precautions Precautions: Fall Restrictions Weight Bearing Restrictions: No      Mobility  Bed Mobility Overal bed mobility: Needs Assistance Bed Mobility: Supine to Sit     Supine to sit: Min guard;HOB elevated     General bed mobility comments: min/guard for safety  Transfers Overall transfer level: Needs assistance Equipment used: Rolling walker (2 wheeled) Transfers: Sit to/from [facility] Sit to Stand: Min assist Stand pivot transfers: Min guard       General transfer comment: initial difficulty with rise however improved with cues, assisted with taking a couple steps over to recliner  Ambulation/Gait             General Gait Details: pt declined today, feeling too weak  Stairs            Wheelchair Mobility    Modified Rankin (Stroke Patients  Only)       Balance Overall balance assessment: Mild deficits observed, not formally tested;Needs assistance         Standing balance support: Bilateral upper extremity supported Standing balance-Leahy Scale: Poor Standing balance comment: requires UE support                             Pertinent Vitals/Pain Pain Assessment: No/denies pain    Home Living Family/patient expects to be discharged to:: Assisted living               Home Equipment: Walker - 4 wheels      Prior Function Level of Independence: Needs assistance   Gait / Transfers Assistance Needed: pt reports she is ambulatory with 4 WW and goes to dining hall for meals  ADL's / Homemaking Assistance Needed: dependent ADLs per chart        Hand Dominance        Extremity/Trunk Assessment        Lower Extremity Assessment Lower Extremity Assessment: Generalized weakness       Communication   Communication: No difficulties  Cognition Arousal/Alertness: Awake/alert Behavior During Therapy: Flat affect Overall Cognitive Status: Within Functional Limits for tasks assessed                                        General Comments      Exercises     Assessment/Plan    PT  Assessment Patient needs continued PT services  PT Problem List Decreased strength;Decreased mobility;Decreased activity tolerance       PT Treatment Interventions DME instruction;Functional mobility training;Balance training;Patient/family education;Gait training;Therapeutic activities;Therapeutic exercise    PT Goals (Current goals can be found in the Care Plan section)  Acute Rehab PT Goals PT Goal Formulation: With patient Time For Goal Achievement: 02/28/19 Potential to Achieve Goals: Good    Frequency Min 3X/week   Barriers to discharge        Co-evaluation               AM-PAC PT "6 Clicks" Mobility  Outcome Measure Help needed turning from your back to your side while  in a flat bed without using bedrails?: None Help needed moving from lying on your back to sitting on the side of a flat bed without using bedrails?: A Little Help needed moving to and from a bed to a chair (including a wheelchair)?: A Little Help needed standing up from a chair using your arms (e.g., wheelchair or bedside chair)?: A Little Help needed to walk in hospital room?: A Little Help needed climbing 3-5 steps with a railing? : A Lot 6 Click Score: 18    End of Session Equipment Utilized During Treatment: Gait belt Activity Tolerance: Patient tolerated treatment well Patient left: with call bell/phone within reach;in chair Nurse Communication: Mobility status PT Visit Diagnosis: Muscle weakness (generalized) (M62.81);Difficulty in walking, not elsewhere classified (R26.2)    Time: 6144-3154 PT Time Calculation (min) (ACUTE ONLY): 17 min   Charges:   PT Evaluation $PT Eval Low Complexity: [place], PT, DPT Acute Rehabilitation Services Office: [phone] Pager: [phone]  Trena Platt 02/14/2019, 1:09 PM

## 2019-02-15 LAB — COMPREHENSIVE METABOLIC PANEL
ALT: 27 U/L (ref 0–44)
AST: 24 U/L (ref 15–41)
Albumin: 2.9 g/dL — ABNORMAL LOW (ref 3.5–5.0)
Alkaline Phosphatase: 22 U/L — ABNORMAL LOW (ref 38–126)
Anion gap: 7 (ref 5–15)
BUN: <5 mg/dL — ABNORMAL LOW (ref 8–23)
CO2: 23 mmol/L (ref 22–32)
Calcium: 8.5 mg/dL — ABNORMAL LOW (ref 8.9–10.3)
Chloride: 109 mmol/L (ref 98–111)
Creatinine, Ser: 0.54 mg/dL (ref 0.44–1.00)
GFR calc non Af Amer: >60 mL/min (ref >60)
Glucose, Bld: 123 mg/dL — ABNORMAL HIGH (ref 70–99)
Potassium: 3.7 mmol/L (ref 3.5–5.1)
Sodium: 139 mmol/L (ref 135–145)
Total Bilirubin: 0.4 mg/dL (ref 0.3–1.2)
Total Protein: 5.5 g/dL — ABNORMAL LOW (ref 6.5–8.1)

## 2019-02-15 LAB — CBC
HCT: 36.8 % (ref 36.0–46.0)
Hemoglobin: 11.7 g/dL — ABNORMAL LOW (ref 12.0–15.0)
MCH: 32.2 pg (ref 26.0–34.0)
MCHC: 31.8 g/dL (ref 30.0–36.0)
MCV: 101.4 fL — ABNORMAL HIGH (ref 80.0–100.0)
Platelets: 184 10*3/uL (ref 150–400)
RBC: 3.63 MIL/uL — ABNORMAL LOW (ref 3.87–5.11)
RDW: 14 % (ref 11.5–15.5)
WBC: 5.5 10*3/uL (ref 4.0–10.5)
nRBC: 0 % (ref 0.0–0.2)

## 2019-02-15 LAB — MAGNESIUM: Magnesium: 1.9 mg/dL (ref 1.7–2.4)

## 2019-02-15 NOTE — Progress Notes (Signed)
Patient given discharge, follow up, and medication instructions, verbalized understanding, IV removed, personal belongings with patient, PTAR to transport home

## 2019-02-15 NOTE — TOC Progression Note (Signed)
Transition of Care [facility]) - Progression Note    Patient Details  Name: ANNSLEY AKKERMAN MRN: [id number] Date of Birth: [birth year]  [facility] CM/SW Contact  Evelio Rueda, Juliann Pulse, RN Phone Number: 02/15/2019, 11:54 AM  Clinical Narrative:  TC Caregiver friend list as contact-Joann Benson-informed of patient's return back to [facility]. TC [facility] confirmed address. HHPT arranged by [facility] orders sent yesterday. PTAR to be called for non emergency ambulance transp back to [facility]. PTAR forms in shadow chart. No further CM needs.     Expected Discharge Plan: Home/Self Care    Expected Discharge Plan and Services Expected Discharge Plan: Home/Self Care Discharge Planning Services: CM Consult     Expected Discharge Date: 02/15/19                   HH Arranged: PT(contract w/[facility] for HHPT-[facility]) HH Agency: Other - See comment   Social Determinants of Health (SDOH) Interventions    Readmission Risk Interventions 30 Day Unplanned Readmission Risk Score     ED to Hosp-Admission (Current) from 02/12/2019 in [facility]  30 Day Unplanned Readmission Risk Score (%)  22 Filed at 02/15/2019 0801     This score is the patient's risk of an unplanned readmission within 30 days of being discharged (0 -100%). The score is based on dignosis, age, lab data, medications, orders, and past utilization.   Low:  0-14.9   Medium: 15-21.9   High: 22-29.9   Extreme: 30 and above       No flowsheet data found.

## 2019-02-15 NOTE — Discharge Summary (Signed)
Physician Discharge Summary  [name] GLO:[id number] DOB: 04-Oct-1941 DOA: 02/12/2019  PCP: [name], MD  Admit date: 02/12/2019 Discharge date: 02/15/2019  Admitted From: Retirement community Disposition: Retirement community  Recommendations for Outpatient Follow-up:  1. Follow up with PCP in 1-2 weeks 2. Please obtain BMP/CBC in one week your next doctors visit.  3. Advised oral hydration  Discharge Condition: Stable CODE STATUS: Full Diet recommendation: Heart healthy  Brief/Interim Summary: 78 year old with history of hyperlipidemia, anxiety, schizoaffective disorder, diastolic CHF, bipolar disorder came to the hospital with complains of nausea vomiting and diarrhea.  Her routine work-up was negative but CT of the abdomen pelvis was consistent with enteritis.  GI panel showed she was norovirus positive.  Apparently there is some sort of bug going around at her retirement community per the patient and multiple patients have this.  During the hospitalization she received supportive therapy and electrolyte repletion.  On the day of discharge she felt much better and wanted to go home.  We will discharge her today in stable condition.   Discharge Diagnoses:  Principal Problem:   Nausea vomiting and diarrhea Active Problems:   Dementia (HCC)   Sepsis (HCC)   HLD (hyperlipidemia)   Anxiety   Schizo-affective schizophrenia (HCC)   Chronic diastolic CHF (congestive heart failure) (HCC)   GERD (gastroesophageal reflux disease)   Bipolar 1 disorder (HCC)  Watery diarrhea secondary to gastroenteritis, secondary to norovirus Poor oral intake -CT of the abdomen pelvis consistent with enteritis, C. difficile is negative -GI panel was positive for norovirus.  Advised for continued supportive therapy.  Her diarrhea has resolved and tolerating oral diet without any issues.  Advised to continue oral hydration even after discharge.  Hypokalemia -Much better.  Will need repeat lab work  in about 5 to 7 days to ensure this is remained stable  History of dementia without any behavioral disturbances -Resume home Namenda and donepezil  Hypothyroidism -Continue Synthroid  History of anxiety/schizoaffective disorder, bipolar 1 disorder -Resume home medications including Xanax, olanzapine, Seroquel and Lamictal  Chronic diastolic congestive heart failure, compensated -Last echocardiogram showed ejection fraction 70% with grade 1 diastolic dysfunction in July 2019.  No obvious evidence of fluid overload.  GERD -PPI  DVT prophylaxis: Lovenox  Code Status: Full Code  Family Communication:  None at Bedside  Disposition Plan:  Discharge today   Consultations:  None  Subjective: Feels much better, no episodes of diarrhea in last 24 hours.  Tolerating oral diet  Discharge Exam: Vitals:   02/14/19 2103 02/15/19 0540  BP: (!) 116/50 (!) 130/53  Pulse: (!) 54 63  Resp: 18 18  Temp: 98.6 F (37 C) 97.9 F (36.6 C)  SpO2: 96% 95%   Vitals:   02/14/19 0511 02/14/19 1501 02/14/19 2103 02/15/19 0540  BP: (!) 127/50 134/72 (!) 116/50 (!) 130/53  Pulse: 66 69 (!) 54 63  Resp: 18 18 18 18   Temp: 98.6 F (37 C) 98.4 F (36.9 C) 98.6 F (37 C) 97.9 F (36.6 C)  TempSrc: Oral Oral Oral Oral  SpO2: 96% 97% 96% 95%  Weight:      Height:        General: Pt is alert, awake, not in acute distress Cardiovascular: RRR, S1/S2 +, no rubs, no gallops Respiratory: CTA bilaterally, no wheezing, no rhonchi Abdominal: Soft, NT, ND, bowel sounds + Extremities: no edema, no cyanosis  Discharge Instructions   Allergies as of 02/15/2019      Reactions   Lithium Nausea Only  Fluoxetine Other (See Comments)   Pt felt crazy   Macrodantin Other (See Comments)   unknown   Mirabegron Other (See Comments)   ineffective   Paroxetine Hcl    Other reaction(s): Other (See Comments) Made pt feel crazy   Paxil [paroxetine] Other (See Comments)   Made pt feel crazy    Prozac [fluoxetine Hcl] Other (See Comments)   Pt felt crazy   Sertraline Hcl    Solifenacin Other (See Comments)   Ineffective    Sulfa Antibiotics Other (See Comments)   unknown   Wellbutrin [bupropion Hcl] Other (See Comments)   unknown      Medication List    TAKE these medications   acetaminophen 500 MG tablet Commonly known as:  TYLENOL Take 500 mg by mouth every 12 (twelve) hours as needed for mild pain, moderate pain, fever or headache.   ALPRAZolam 0.5 MG tablet Commonly known as:  XANAX Take 1 mg by mouth 4 (four) times daily as needed for anxiety.   CALCIUM MAGNESIUM 750 PO Take 1 tablet by mouth 2 (two) times daily.   donepezil 10 MG tablet Commonly known as:  ARICEPT Take 10 mg by mouth every morning.   fenofibrate 145 MG tablet Commonly known as:  TRICOR TAKE 1 TABLET EVERY TUESDAY,THURSDAY,AND SATURDAY AT 6 PM What changed:  See the new instructions.   Fish Oil 1000 MG Caps Take 1,000 mg by mouth daily.   HYDROcodone-acetaminophen 5-325 MG tablet Commonly known as:  NORCO/VICODIN Take 1 tablet by mouth daily as needed for pain.   LACTOBACILLUS PO Take 1 capsule by mouth daily.   lamoTRIgine 150 MG tablet Commonly known as:  LAMICTAL Take 150 mg by mouth daily.   memantine 10 MG tablet Commonly known as:  NAMENDA Take 10 mg by mouth 2 (two) times daily.   metoprolol tartrate 25 MG tablet Commonly known as:  LOPRESSOR TAKE 1 TABLET BY MOUTH EVERY DAY What changed:  when to take this   MULTIVITAMIN ADULT PO Take 1 tablet by mouth daily.   OLANZapine 15 MG tablet Commonly known as:  ZYPREXA Take 15 mg by mouth 2 (two) times daily.   omeprazole 40 MG capsule Commonly known as:  PRILOSEC Take 40 mg by mouth daily.   QUEtiapine 25 MG tablet Commonly known as:  SEROQUEL Take 25 mg by mouth 3 (three) times daily.   ramelteon 8 MG tablet Commonly known as:  ROZEREM Take 8 mg by mouth at bedtime as needed for sleep.   spironolactone 25  MG tablet Commonly known as:  ALDACTONE TAKE 1 TABLET BY MOUTH DAILY   Synthroid 150 MCG tablet Generic drug:  levothyroxine TAKE 1 TABLET (150 MCG TOTAL) BY MOUTH DAILY. What changed:  See the new instructions.      [facility]. Follow up.   Specialty:  Physical Therapy Why:  to be provided @ [facility] Contact information: [address] [place] [address] [phone]        [name], MD. Schedule an appointment as soon as possible for a visit in 1 week(s).   Specialty:  Internal Medicine Contact information: [address] [place] [address]        Skeet Latch, MD .   Specialty:  Cardiology Contact information: [address] [place] [address] [phone]          Allergies  Allergen Reactions  . Lithium Nausea Only  .  Fluoxetine Other (See Comments)    Pt felt crazy  . Macrodantin Other (See Comments)    unknown  . Mirabegron Other (See Comments)    ineffective  . Paroxetine Hcl     Other reaction(s): Other (See Comments) Made pt feel crazy  . Paxil [Paroxetine] Other (See Comments)    Made pt feel crazy   . Prozac [Fluoxetine Hcl] Other (See Comments)    Pt felt crazy   . Sertraline Hcl   . Solifenacin Other (See Comments)    Ineffective   . Sulfa Antibiotics Other (See Comments)    unknown  . Wellbutrin [Bupropion Hcl] Other (See Comments)    unknown    You were cared for by a hospitalist during your hospital stay. If you have any questions about your discharge medications or the care you received while you were in the hospital after you are discharged, you can call the unit and asked to speak with the hospitalist on call if the hospitalist that took care of you is not available. Once you are discharged, your primary care physician will handle any further medical issues. Please note that no refills for any discharge medications  will be authorized once you are discharged, as it is imperative that you return to your primary care physician (or establish a relationship with a primary care physician if you do not have one) for your aftercare needs so that they can reassess your need for medications and monitor your lab values.   Procedures/Studies: Dg Chest 2 View  Result Date: 02/12/2019 CLINICAL DATA:  Nausea and vomiting EXAM: CHEST - 2 VIEW COMPARISON:  07/19/2018 FINDINGS: Top-normal heart size. Nonaneurysmal thoracic aorta. Lungs are clear. No effusion or pneumothorax. Degenerative changes are present along the thoracic spine. IMPRESSION: No active cardiopulmonary disease. Electronically Signed   By: Ashley Royalty M.D.   On: 02/12/2019 23:38   Ct Abdomen Pelvis W Contrast  Result Date: 02/13/2019 CLINICAL DATA:  Nausea and vomiting. Abdominal pain EXAM: CT ABDOMEN AND PELVIS WITH CONTRAST TECHNIQUE: Multidetector CT imaging of the abdomen and pelvis was performed using the standard protocol following bolus administration of intravenous contrast. CONTRAST:  128mL ISOVUE-300 IOPAMIDOL (ISOVUE-300) INJECTION 61% COMPARISON:  Report from abdominal CT 07/23/2018 outside institution FINDINGS: Lower chest: Tree in bud opacities in the right middle lobe. Linear subsegmental atelectasis. Hepatobiliary: Hepatic steatosis with focal abnormality. Clips in the gallbladder fossa postcholecystectomy. No biliary dilatation. Pancreas: No ductal dilatation or inflammation. Spleen: Normal in size without focal abnormality. Adrenals/Urinary Tract: Normal adrenal glands. No hydronephrosis or perinephric edema. Homogeneous renal enhancement with symmetric excretion on delayed phase imaging. Scattered cortical hypodensities are too small to accurately characterize but likely small cysts. Urinary bladder is physiologically distended without wall thickening. Stomach/Bowel: Prior fundoplication. Stomach physiologically distended. Fluid-filled nondilated  small bowel in a nonobstructive pattern. Fluid/liquid stool in the ascending, transverse, and proximal descending colon. Formed stool in the more distal colon. No colonic wall thickening or inflammatory change. Normal appendix. Vascular/Lymphatic: Aortic atherosclerosis without aneurysm. High-density material within the retroperitoneal, coursing along the pelvic vasculature, and in the pelvis, some of which are within lymph nodes, likely sequela of prior lymphangiography. No enlarged lymph nodes in the abdomen or pelvis. Reproductive: Uterus and bilateral adnexa are unremarkable. Other: No free air, free fluid, or intra-abdominal fluid collection. Musculoskeletal: There are no acute or suspicious osseous abnormalities. IMPRESSION: 1. Fluid-filled nondilated small bowel with liquid stool in the ascending, transverse, and proximal descending colon, consistent with diarrheal process a possible non-specific  enteritis. No bowel inflammation. 2. Hepatic steatosis. 3. Tree in bud opacities in the right middle lobe may be infectious or inflammatory. Aortic Atherosclerosis (ICD10-I70.0). Electronically Signed   By: Keith Rake M.D.   On: 02/13/2019 01:35      The results of significant diagnostics from this hospitalization (including imaging, microbiology, ancillary and laboratory) are listed below for reference.     Microbiology: Recent Results (from the past 240 hour(s))  Culture, blood (Routine x 2)     Status: None (Preliminary result)   Collection Time: 02/12/19 10:51 PM  Result Value Ref Range Status   Specimen Description   Final    BLOOD LEFT ARM Performed at [facility], [place] [address], [place], [place] [address]    Special Requests   Final    BOTTLES DRAWN AEROBIC AND ANAEROBIC Blood Culture adequate volume Performed at [facility] [address], [place], [place] [address]    Culture   Final    NO GROWTH 2 DAYS Performed at [facility] [address], [place], [place] [address]    Report Status PENDING  Incomplete  Culture, blood (Routine x 2)     Status: None (Preliminary result)   Collection Time: 02/12/19 11:28 PM  Result Value Ref Range Status   Specimen Description   Final    BLOOD RIGHT FOREARM Performed at [facility] [address] [place], [place] [address]    Special Requests   Final    BOTTLES DRAWN AEROBIC AND ANAEROBIC Blood Culture adequate volume Performed at [facility] [address], [place], [place] [address]    Culture   Final    NO GROWTH 2 DAYS Performed at [facility] [address], [place], [place] [address]    Report Status PENDING  Incomplete  MRSA PCR Screening     Status: None   Collection Time: 02/13/19  9:10 AM  Result Value Ref Range Status   MRSA by PCR NEGATIVE NEGATIVE Final    Comment:        The GeneXpert MRSA Assay (FDA approved for NASAL specimens only), is one component of a comprehensive MRSA colonization surveillance program. It is not intended to diagnose MRSA infection nor to guide or monitor treatment for MRSA infections. Performed at [facility], [place] [address] [place], [place] [address]   C difficile quick scan w PCR reflex     Status: None   Collection Time: 02/13/19  9:53 AM  Result Value Ref Range Status   C Diff antigen NEGATIVE NEGATIVE Final   C Diff toxin NEGATIVE NEGATIVE Final   C Diff interpretation No C. difficile detected.  Final    Comment: Performed at [facility], [place] [address] [place], [place] [address]  Gastrointestinal Panel by PCR , Stool     Status: Abnormal   Collection Time: 02/13/19  9:53 AM  Result Value Ref Range Status   Campylobacter species NOT DETECTED NOT DETECTED Final   Plesimonas shigelloides NOT DETECTED NOT DETECTED Final   Salmonella species NOT DETECTED NOT DETECTED Final   Yersinia enterocolitica NOT DETECTED NOT DETECTED Final   Vibrio  species NOT DETECTED NOT DETECTED Final   Vibrio cholerae NOT DETECTED NOT DETECTED Final   Enteroaggregative E coli (EAEC) NOT DETECTED NOT DETECTED Final   Enteropathogenic E coli (EPEC) NOT DETECTED NOT DETECTED Final   Enterotoxigenic E coli (ETEC) NOT DETECTED NOT DETECTED Final  Shiga like toxin producing E coli (STEC) NOT DETECTED NOT DETECTED Final   Shigella/Enteroinvasive E coli (EIEC) NOT DETECTED NOT DETECTED Final   Cryptosporidium NOT DETECTED NOT DETECTED Final   Cyclospora cayetanensis NOT DETECTED NOT DETECTED Final   Entamoeba histolytica NOT DETECTED NOT DETECTED Final   Giardia lamblia NOT DETECTED NOT DETECTED Final   Adenovirus F40/41 NOT DETECTED NOT DETECTED Final   Astrovirus NOT DETECTED NOT DETECTED Final   Norovirus GI/GII DETECTED (A) NOT DETECTED Final    Comment: CRITICAL RESULT CALLED TO, READ BACK BY AND VERIFIED WITH: CALLED TO KIMBERLY GURLY @[email or web address]  LONG 1500 02/14/2019    Rotavirus A NOT DETECTED NOT DETECTED Final   Sapovirus (I, II, IV, and V) NOT DETECTED NOT DETECTED Final    Comment: Performed at [facility], [place] [place], [place] [address]     Labs: BNP (last 3 results) Recent Labs    02/13/19 0640  BNP 536.4*   Basic Metabolic Panel: Recent Labs  Lab 02/12/19 2251 02/13/19 0640 02/14/19 0519 02/14/19 1348 02/15/19 0538  NA 141 139 139 140 139  K 3.4* 3.2* 2.9* 3.4* 3.7  CL 110 110 115* 113* 109  CO2 20* 20* 18* 21* 23  GLUCOSE 134* 119* 112* 112* 123*  BUN 33* 28* 11 8 <5*  CREATININE 0.96 0.88 0.65 0.63 0.54  CALCIUM 8.9 7.5* 8.2* 8.2* 8.5*  MG 1.8  --  2.0  --  1.9   Liver Function Tests: Recent Labs  Lab 02/12/19 2251 02/14/19 0519 02/15/19 0538  AST 28 35 24  ALT 34 28 27  ALKPHOS 33* 25* 22*  BILITOT 0.6 0.5 0.4  PROT 6.8 5.5* 5.5*  ALBUMIN 3.9 2.7* 2.9*   No results for input(s): LIPASE, AMYLASE in the last 168 hours. No results for input(s): AMMONIA in the last 168 hours. CBC: Recent  Labs  Lab 02/12/19 2251 02/13/19 0640 02/14/19 0519 02/15/19 0538  WBC 7.0 8.7 6.4 5.5  NEUTROABS 6.1  --   --   --   HGB 15.6* 13.6 12.6 11.7*  HCT 48.4* 42.4 40.7 36.8  MCV 99.8 100.2* 103.6* 101.4*  PLT 252 243 193 184   Cardiac Enzymes: No results for input(s): CKTOTAL, CKMB, CKMBINDEX, TROPONINI in the last 168 hours. BNP: Invalid input(s): POCBNP CBG: No results for input(s): GLUCAP in the last 168 hours. D-Dimer No results for input(s): DDIMER in the last 72 hours. Hgb A1c No results for input(s): HGBA1C in the last 72 hours. Lipid Profile No results for input(s): CHOL, HDL, LDLCALC, TRIG, CHOLHDL, LDLDIRECT in the last 72 hours. Thyroid function studies Recent Labs    02/12/19 2251  TSH 3.455   Anemia work up No results for input(s): VITAMINB12, FOLATE, FERRITIN, TIBC, IRON, RETICCTPCT in the last 72 hours. Urinalysis    Component Value Date/Time   COLORURINE YELLOW 02/12/2019 2352   APPEARANCEUR CLEAR 02/12/2019 2352   LABSPEC 1.020 02/12/2019 2352   PHURINE 5.0 02/12/2019 2352   GLUCOSEU NEGATIVE 02/12/2019 2352   HGBUR NEGATIVE 02/12/2019 2352   BILIRUBINUR NEGATIVE 02/12/2019 2352   KETONESUR NEGATIVE 02/12/2019 2352   PROTEINUR NEGATIVE 02/12/2019 2352   NITRITE NEGATIVE 02/12/2019 2352   LEUKOCYTESUR NEGATIVE 02/12/2019 2352   Sepsis Labs Invalid input(s): PROCALCITONIN,  WBC,  LACTICIDVEN Microbiology Recent Results (from the past 240 hour(s))  Culture, blood (Routine x 2)     Status: None (Preliminary result)   Collection Time: 02/12/19 10:51 PM  Result Value Ref Range Status  Specimen Description   Final    BLOOD LEFT ARM Performed at [facility] [address] [place], [place] [address]    Special Requests   Final    BOTTLES DRAWN AEROBIC AND ANAEROBIC Blood Culture adequate volume Performed at [facility] [address], [place], [place] [address]    Culture   Final    NO GROWTH 2  DAYS Performed at [facility] [address], [place], [place] [address]    Report Status PENDING  Incomplete  Culture, blood (Routine x 2)     Status: None (Preliminary result)   Collection Time: 02/12/19 11:28 PM  Result Value Ref Range Status   Specimen Description   Final    BLOOD RIGHT FOREARM Performed at [facility] [address], [place], [place] [address]    Special Requests   Final    BOTTLES DRAWN AEROBIC AND ANAEROBIC Blood Culture adequate volume Performed at [facility] [address] [place], [place] [address]    Culture   Final    NO GROWTH 2 DAYS Performed at [facility] [address], [place], [place] [address]    Report Status PENDING  Incomplete  MRSA PCR Screening     Status: None   Collection Time: 02/13/19  9:10 AM  Result Value Ref Range Status   MRSA by PCR NEGATIVE NEGATIVE Final    Comment:        The GeneXpert MRSA Assay (FDA approved for NASAL specimens only), is one component of a comprehensive MRSA colonization surveillance program. It is not intended to diagnose MRSA infection nor to guide or monitor treatment for MRSA infections. Performed at [facility], [place] [address], [place], [place] [address]   C difficile quick scan w PCR reflex     Status: None   Collection Time: 02/13/19  9:53 AM  Result Value Ref Range Status   C Diff antigen NEGATIVE NEGATIVE Final   C Diff toxin NEGATIVE NEGATIVE Final   C Diff interpretation No C. difficile detected.  Final    Comment: Performed at [facility], [place] [address] [place], [place] [address]  Gastrointestinal Panel by PCR , Stool     Status: Abnormal   Collection Time: 02/13/19  9:53 AM  Result Value Ref Range Status   Campylobacter species NOT DETECTED NOT DETECTED Final   Plesimonas shigelloides NOT DETECTED NOT DETECTED Final   Salmonella species NOT DETECTED NOT DETECTED Final   Yersinia enterocolitica  NOT DETECTED NOT DETECTED Final   Vibrio species NOT DETECTED NOT DETECTED Final   Vibrio cholerae NOT DETECTED NOT DETECTED Final   Enteroaggregative E coli (EAEC) NOT DETECTED NOT DETECTED Final   Enteropathogenic E coli (EPEC) NOT DETECTED NOT DETECTED Final   Enterotoxigenic E coli (ETEC) NOT DETECTED NOT DETECTED Final   Shiga like toxin producing E coli (STEC) NOT DETECTED NOT DETECTED Final   Shigella/Enteroinvasive E coli (EIEC) NOT DETECTED NOT DETECTED Final   Cryptosporidium NOT DETECTED NOT DETECTED Final   Cyclospora cayetanensis NOT DETECTED NOT DETECTED Final   Entamoeba histolytica NOT DETECTED NOT DETECTED Final   Giardia lamblia NOT DETECTED NOT DETECTED Final   Adenovirus F40/41 NOT DETECTED NOT DETECTED Final   Astrovirus NOT DETECTED NOT DETECTED Final   Norovirus GI/GII DETECTED (A) NOT DETECTED Final    Comment: CRITICAL RESULT CALLED TO, READ BACK BY AND VERIFIED WITH: CALLED TO KIMBERLY GURLY @[facility]   LONG 1500 02/14/2019    Rotavirus A NOT DETECTED NOT DETECTED Final   Sapovirus (I, II, IV, and V) NOT DETECTED NOT DETECTED Final    Comment: Performed at [facility], [place] [place], [place] [address]     Time coordinating discharge:  I have spent 35 minutes face to face with the patient and on the ward discussing the patients care, assessment, plan and disposition with other care givers. >50% of the time was devoted counseling the patient about the risks and benefits of treatment/Discharge disposition and coordinating care.   SIGNED:   Damita Lack, MD  [facility] 02/15/2019, 11:43 AM   If 7PM-7AM, please contact night-coverage [email or web address]

## 2019-02-15 NOTE — Progress Notes (Signed)
Assuming care of patient, report given per off going RN

## 2019-02-15 NOTE — Evaluation (Addendum)
Occupational Therapy Evaluation Patient Details Name: [name] MRN: [id number] DOB: 1941-03-21 Today's Date: 02/15/2019    History of Present Illness Pt is a 78 year old female presenting from [place] with nausea, vomiting, diarrhea secondary to gastroenteritis. Past medical history significant of Dementia, Sepsis, HLD (hyperlipidemia), Anxiety, Schizo-affective schizophrenia, dCHF, GERD, Bipolar 1 disorder   Clinical Impression   Pt was admitted for the above.  Plan is for her to return to [place] today.  Pt is usually mod I for adls and needs min guard at this time.  She will likely need increased staff assistance initially, and she states there are CNAs there.  Recommend HHOT also.      Follow Up Recommendations  Home health OT;Supervision - Intermittent --for adls/mobility   Equipment Recommendations  None recommended by OT    Recommendations for Other Services       Precautions / Restrictions Precautions Precautions: Fall Restrictions Weight Bearing Restrictions: No      Mobility Bed Mobility         Supine to sit: Supervision     General bed mobility comments: HOB raised 20; pt sleeps on several pillows  Transfers   Equipment used: Rolling walker (2 wheeled)   Sit to Stand: Min guard Stand pivot transfers: Min guard       General transfer comment: for safety    Balance                                           ADL either performed or assessed with clinical judgement   ADL Overall ADL's : Needs assistance/impaired Eating/Feeding: Independent   Grooming: Oral care;Set up;Sitting   Upper Body Bathing: Set up;Sitting   Lower Body Bathing: Min guard;Sit to/from stand   Upper Body Dressing : Set up;Sitting   Lower Body Dressing: Minimal assistance;Sit to/from stand   Toilet Transfer: Min guard;Stand-pivot;RW(chair)   Toileting- Water quality scientist and Hygiene: Min guard;Sit to/from stand         General ADL  Comments: pt performed ADL from EOB and got into chair.  Min guard for safety. Increased WOB with activity. Educated on breaking activities down and taking rest breaks     Vision         Perception     Praxis      Pertinent Vitals/Pain Pain Assessment: No/denies pain     Hand Dominance     Extremity/Trunk Assessment Upper Extremity Assessment Upper Extremity Assessment: Generalized weakness   bil shoulders limited to approximately 90 during adls.             Communication Communication Communication: No difficulties   Cognition Arousal/Alertness: Awake/alert Behavior During Therapy: WFL for tasks assessed/performed Overall Cognitive Status: Within Functional Limits for tasks assessed                                     General Comments       Exercises     Shoulder Instructions      Home Living Family/patient expects to be discharged to:: Assisted living                             Home Equipment: Walker - 4 wheels          Prior Functioning/Environment  Level of Independence: Needs assistance  Gait / Transfers Assistance Needed: walks with 4WW ADL's / Homemaking Assistance Needed: pt states she completes own adls, but does not get into shower.  Uses premoistened washcloths            OT Problem List: Decreased strength;Decreased activity tolerance;Impaired balance (sitting and/or standing);Decreased knowledge of use of DME or AE      OT Treatment/Interventions:      OT Goals(Current goals can be found in the care plan section) Acute Rehab OT Goals Patient Stated Goal: regain strength; back to [place] OT Goal Formulation: All assessment and education complete, DC therapy  OT Frequency:     Barriers to D/C:            Co-evaluation              AM-PAC OT "6 Clicks" Daily Activity     Outcome Measure Help from another person eating meals?: None Help from another person taking care of personal grooming?: A  Little Help from another person toileting, which includes using toliet, bedpan, or urinal?: A Little Help from another person bathing (including washing, rinsing, drying)?: A Little Help from another person to put on and taking off regular upper body clothing?: A Little Help from another person to put on and taking off regular lower body clothing?: A Little 6 Click Score: 19   End of Session    Activity Tolerance: Patient tolerated treatment well Patient left: in chair;with call bell/phone within reach  OT Visit Diagnosis: Unsteadiness on feet (R26.81);Muscle weakness (generalized) (M62.81)                Time: 4235-3614 OT Time Calculation (min): 29 min Charges:  OT General Charges $OT Visit: 1 Visit OT Evaluation $OT Eval Low Complexity: 1 Low OT Treatments $Self Care/Home Management : 8-22 mins  Lesle Chris, OTR/L Acute Rehabilitation Services [phone] [facility] pager [phone] office 02/15/2019  [place] 02/15/2019, 9:54 AM

## 2019-02-17 DIAGNOSIS — M6281 Muscle weakness (generalized): Secondary | ICD-10-CM | POA: Diagnosis not present

## 2019-02-17 DIAGNOSIS — R2681 Unsteadiness on feet: Secondary | ICD-10-CM | POA: Diagnosis not present

## 2019-02-18 LAB — CULTURE, BLOOD (ROUTINE X 2)
CULTURE: NO GROWTH
CULTURE: NO GROWTH
Special Requests: ADEQUATE
Special Requests: ADEQUATE

## 2019-02-21 DIAGNOSIS — M6281 Muscle weakness (generalized): Secondary | ICD-10-CM | POA: Diagnosis not present

## 2019-02-21 DIAGNOSIS — R2681 Unsteadiness on feet: Secondary | ICD-10-CM | POA: Diagnosis not present

## 2019-02-23 DIAGNOSIS — E782 Mixed hyperlipidemia: Secondary | ICD-10-CM | POA: Diagnosis not present

## 2019-02-23 DIAGNOSIS — E039 Hypothyroidism, unspecified: Secondary | ICD-10-CM | POA: Diagnosis not present

## 2019-02-23 DIAGNOSIS — I10 Essential (primary) hypertension: Secondary | ICD-10-CM | POA: Diagnosis not present

## 2019-02-23 DIAGNOSIS — G301 Alzheimer's disease with late onset: Secondary | ICD-10-CM | POA: Diagnosis not present

## 2019-02-23 DIAGNOSIS — M6281 Muscle weakness (generalized): Secondary | ICD-10-CM | POA: Diagnosis not present

## 2019-02-23 DIAGNOSIS — R2681 Unsteadiness on feet: Secondary | ICD-10-CM | POA: Diagnosis not present

## 2019-02-28 DIAGNOSIS — R2681 Unsteadiness on feet: Secondary | ICD-10-CM | POA: Diagnosis not present

## 2019-02-28 DIAGNOSIS — M6281 Muscle weakness (generalized): Secondary | ICD-10-CM | POA: Diagnosis not present

## 2019-03-02 DIAGNOSIS — R2681 Unsteadiness on feet: Secondary | ICD-10-CM | POA: Diagnosis not present

## 2019-03-02 DIAGNOSIS — M6281 Muscle weakness (generalized): Secondary | ICD-10-CM | POA: Diagnosis not present

## 2019-03-08 DIAGNOSIS — M6281 Muscle weakness (generalized): Secondary | ICD-10-CM | POA: Diagnosis not present

## 2019-03-08 DIAGNOSIS — R2681 Unsteadiness on feet: Secondary | ICD-10-CM | POA: Diagnosis not present

## 2019-03-10 DIAGNOSIS — R2681 Unsteadiness on feet: Secondary | ICD-10-CM | POA: Diagnosis not present

## 2019-03-10 DIAGNOSIS — M6281 Muscle weakness (generalized): Secondary | ICD-10-CM | POA: Diagnosis not present

## 2019-03-14 DIAGNOSIS — R2681 Unsteadiness on feet: Secondary | ICD-10-CM | POA: Diagnosis not present

## 2019-03-14 DIAGNOSIS — M6281 Muscle weakness (generalized): Secondary | ICD-10-CM | POA: Diagnosis not present

## 2019-03-17 DIAGNOSIS — R2681 Unsteadiness on feet: Secondary | ICD-10-CM | POA: Diagnosis not present

## 2019-03-17 DIAGNOSIS — M6281 Muscle weakness (generalized): Secondary | ICD-10-CM | POA: Diagnosis not present

## 2019-03-21 DIAGNOSIS — R2681 Unsteadiness on feet: Secondary | ICD-10-CM | POA: Diagnosis not present

## 2019-03-21 DIAGNOSIS — M6281 Muscle weakness (generalized): Secondary | ICD-10-CM | POA: Diagnosis not present

## 2019-03-23 DIAGNOSIS — R2681 Unsteadiness on feet: Secondary | ICD-10-CM | POA: Diagnosis not present

## 2019-03-23 DIAGNOSIS — M6281 Muscle weakness (generalized): Secondary | ICD-10-CM | POA: Diagnosis not present

## 2019-03-28 DIAGNOSIS — M6281 Muscle weakness (generalized): Secondary | ICD-10-CM | POA: Diagnosis not present

## 2019-03-28 DIAGNOSIS — R2681 Unsteadiness on feet: Secondary | ICD-10-CM | POA: Diagnosis not present

## 2019-03-30 DIAGNOSIS — M6281 Muscle weakness (generalized): Secondary | ICD-10-CM | POA: Diagnosis not present

## 2019-03-30 DIAGNOSIS — R2681 Unsteadiness on feet: Secondary | ICD-10-CM | POA: Diagnosis not present

## 2019-04-04 DIAGNOSIS — R2681 Unsteadiness on feet: Secondary | ICD-10-CM | POA: Diagnosis not present

## 2019-04-04 DIAGNOSIS — M6281 Muscle weakness (generalized): Secondary | ICD-10-CM | POA: Diagnosis not present

## 2019-04-06 DIAGNOSIS — R2681 Unsteadiness on feet: Secondary | ICD-10-CM | POA: Diagnosis not present

## 2019-04-06 DIAGNOSIS — M6281 Muscle weakness (generalized): Secondary | ICD-10-CM | POA: Diagnosis not present

## 2019-04-07 DIAGNOSIS — M1712 Unilateral primary osteoarthritis, left knee: Secondary | ICD-10-CM | POA: Diagnosis not present

## 2019-04-07 DIAGNOSIS — M170 Bilateral primary osteoarthritis of knee: Secondary | ICD-10-CM | POA: Diagnosis not present

## 2019-04-07 DIAGNOSIS — M25562 Pain in left knee: Secondary | ICD-10-CM | POA: Diagnosis not present

## 2019-04-07 DIAGNOSIS — M545 Low back pain: Secondary | ICD-10-CM | POA: Diagnosis not present

## 2019-04-07 DIAGNOSIS — M1711 Unilateral primary osteoarthritis, right knee: Secondary | ICD-10-CM | POA: Diagnosis not present

## 2019-04-11 DIAGNOSIS — R2681 Unsteadiness on feet: Secondary | ICD-10-CM | POA: Diagnosis not present

## 2019-04-11 DIAGNOSIS — M6281 Muscle weakness (generalized): Secondary | ICD-10-CM | POA: Diagnosis not present

## 2019-04-13 DIAGNOSIS — R2681 Unsteadiness on feet: Secondary | ICD-10-CM | POA: Diagnosis not present

## 2019-04-13 DIAGNOSIS — M6281 Muscle weakness (generalized): Secondary | ICD-10-CM | POA: Diagnosis not present

## 2019-04-18 DIAGNOSIS — R2681 Unsteadiness on feet: Secondary | ICD-10-CM | POA: Diagnosis not present

## 2019-04-18 DIAGNOSIS — M6281 Muscle weakness (generalized): Secondary | ICD-10-CM | POA: Diagnosis not present

## 2019-04-20 DIAGNOSIS — R2681 Unsteadiness on feet: Secondary | ICD-10-CM | POA: Diagnosis not present

## 2019-04-20 DIAGNOSIS — M6281 Muscle weakness (generalized): Secondary | ICD-10-CM | POA: Diagnosis not present

## 2019-04-25 DIAGNOSIS — R2681 Unsteadiness on feet: Secondary | ICD-10-CM | POA: Diagnosis not present

## 2019-04-25 DIAGNOSIS — M6281 Muscle weakness (generalized): Secondary | ICD-10-CM | POA: Diagnosis not present

## 2019-04-29 DIAGNOSIS — F259 Schizoaffective disorder, unspecified: Secondary | ICD-10-CM | POA: Diagnosis not present

## 2019-04-29 DIAGNOSIS — I10 Essential (primary) hypertension: Secondary | ICD-10-CM | POA: Diagnosis not present

## 2019-04-29 DIAGNOSIS — E039 Hypothyroidism, unspecified: Secondary | ICD-10-CM | POA: Diagnosis not present

## 2019-04-29 DIAGNOSIS — G301 Alzheimer's disease with late onset: Secondary | ICD-10-CM | POA: Diagnosis not present

## 2019-05-12 DIAGNOSIS — D485 Neoplasm of uncertain behavior of skin: Secondary | ICD-10-CM | POA: Diagnosis not present

## 2019-05-12 DIAGNOSIS — L821 Other seborrheic keratosis: Secondary | ICD-10-CM | POA: Diagnosis not present

## 2019-05-12 DIAGNOSIS — D0472 Carcinoma in situ of skin of left lower limb, including hip: Secondary | ICD-10-CM | POA: Diagnosis not present

## 2019-05-12 DIAGNOSIS — Z85828 Personal history of other malignant neoplasm of skin: Secondary | ICD-10-CM | POA: Diagnosis not present

## 2019-05-19 DIAGNOSIS — M1712 Unilateral primary osteoarthritis, left knee: Secondary | ICD-10-CM | POA: Diagnosis not present

## 2019-05-19 DIAGNOSIS — M25562 Pain in left knee: Secondary | ICD-10-CM | POA: Diagnosis not present

## 2019-05-26 DIAGNOSIS — M1712 Unilateral primary osteoarthritis, left knee: Secondary | ICD-10-CM | POA: Diagnosis not present

## 2019-05-31 DIAGNOSIS — D0472 Carcinoma in situ of skin of left lower limb, including hip: Secondary | ICD-10-CM | POA: Diagnosis not present

## 2019-05-31 DIAGNOSIS — Z85828 Personal history of other malignant neoplasm of skin: Secondary | ICD-10-CM | POA: Diagnosis not present

## 2019-05-31 DIAGNOSIS — L821 Other seborrheic keratosis: Secondary | ICD-10-CM | POA: Diagnosis not present

## 2019-06-02 DIAGNOSIS — M1712 Unilateral primary osteoarthritis, left knee: Secondary | ICD-10-CM | POA: Diagnosis not present

## 2019-06-16 DIAGNOSIS — H40013 Open angle with borderline findings, low risk, bilateral: Secondary | ICD-10-CM | POA: Diagnosis not present

## 2019-06-16 DIAGNOSIS — H35373 Puckering of macula, bilateral: Secondary | ICD-10-CM | POA: Diagnosis not present

## 2019-06-16 DIAGNOSIS — H353132 Nonexudative age-related macular degeneration, bilateral, intermediate dry stage: Secondary | ICD-10-CM | POA: Diagnosis not present

## 2019-06-16 DIAGNOSIS — H4323 Crystalline deposits in vitreous body, bilateral: Secondary | ICD-10-CM | POA: Diagnosis not present

## 2019-06-29 DIAGNOSIS — H0014 Chalazion left upper eyelid: Secondary | ICD-10-CM | POA: Diagnosis not present

## 2019-06-29 DIAGNOSIS — H40013 Open angle with borderline findings, low risk, bilateral: Secondary | ICD-10-CM | POA: Diagnosis not present

## 2019-06-29 DIAGNOSIS — H01026 Squamous blepharitis left eye, unspecified eyelid: Secondary | ICD-10-CM | POA: Diagnosis not present

## 2019-06-29 DIAGNOSIS — H01023 Squamous blepharitis right eye, unspecified eyelid: Secondary | ICD-10-CM | POA: Diagnosis not present

## 2019-07-14 DIAGNOSIS — H04123 Dry eye syndrome of bilateral lacrimal glands: Secondary | ICD-10-CM | POA: Diagnosis not present

## 2019-07-14 DIAGNOSIS — H0014 Chalazion left upper eyelid: Secondary | ICD-10-CM | POA: Diagnosis not present

## 2019-07-14 DIAGNOSIS — H01023 Squamous blepharitis right eye, unspecified eyelid: Secondary | ICD-10-CM | POA: Diagnosis not present

## 2019-07-14 DIAGNOSIS — H40013 Open angle with borderline findings, low risk, bilateral: Secondary | ICD-10-CM | POA: Diagnosis not present

## 2019-07-20 DIAGNOSIS — D493 Neoplasm of unspecified behavior of breast: Secondary | ICD-10-CM | POA: Diagnosis not present

## 2019-07-20 DIAGNOSIS — C50911 Malignant neoplasm of unspecified site of right female breast: Secondary | ICD-10-CM | POA: Diagnosis not present

## 2019-07-20 DIAGNOSIS — Z01419 Encounter for gynecological examination (general) (routine) without abnormal findings: Secondary | ICD-10-CM | POA: Diagnosis not present

## 2019-07-20 DIAGNOSIS — M8580 Other specified disorders of bone density and structure, unspecified site: Secondary | ICD-10-CM | POA: Diagnosis not present

## 2019-07-20 DIAGNOSIS — F0390 Unspecified dementia without behavioral disturbance: Secondary | ICD-10-CM | POA: Diagnosis not present

## 2019-07-21 DIAGNOSIS — M1712 Unilateral primary osteoarthritis, left knee: Secondary | ICD-10-CM | POA: Diagnosis not present

## 2019-07-21 DIAGNOSIS — M25562 Pain in left knee: Secondary | ICD-10-CM | POA: Diagnosis not present

## 2019-07-21 DIAGNOSIS — M545 Low back pain: Secondary | ICD-10-CM | POA: Diagnosis not present

## 2019-07-29 DIAGNOSIS — G4452 New daily persistent headache (NDPH): Secondary | ICD-10-CM | POA: Diagnosis not present

## 2019-08-16 DIAGNOSIS — M1712 Unilateral primary osteoarthritis, left knee: Secondary | ICD-10-CM | POA: Diagnosis not present

## 2019-08-16 DIAGNOSIS — M25562 Pain in left knee: Secondary | ICD-10-CM | POA: Diagnosis not present

## 2019-08-23 ENCOUNTER — Encounter: Payer: Self-pay | Admitting: Neurology

## 2019-08-29 ENCOUNTER — Ambulatory Visit: Payer: Medicare HMO | Admitting: Neurology

## 2019-08-31 DIAGNOSIS — Z119 Encounter for screening for infectious and parasitic diseases, unspecified: Secondary | ICD-10-CM | POA: Diagnosis not present

## 2019-09-01 DIAGNOSIS — M542 Cervicalgia: Secondary | ICD-10-CM | POA: Diagnosis not present

## 2019-09-01 DIAGNOSIS — M1712 Unilateral primary osteoarthritis, left knee: Secondary | ICD-10-CM | POA: Diagnosis not present

## 2019-09-01 DIAGNOSIS — M25562 Pain in left knee: Secondary | ICD-10-CM | POA: Diagnosis not present

## 2019-09-08 DIAGNOSIS — L720 Epidermal cyst: Secondary | ICD-10-CM | POA: Diagnosis not present

## 2019-09-08 DIAGNOSIS — D485 Neoplasm of uncertain behavior of skin: Secondary | ICD-10-CM | POA: Diagnosis not present

## 2019-09-08 DIAGNOSIS — L821 Other seborrheic keratosis: Secondary | ICD-10-CM | POA: Diagnosis not present

## 2019-09-08 DIAGNOSIS — Z85828 Personal history of other malignant neoplasm of skin: Secondary | ICD-10-CM | POA: Diagnosis not present

## 2019-09-08 DIAGNOSIS — L570 Actinic keratosis: Secondary | ICD-10-CM | POA: Diagnosis not present

## 2019-09-12 NOTE — Progress Notes (Signed)
NEUROLOGY CONSULTATION NOTE  [name] MRN: [id number] DOB: 1941/01/27  Referring provider: [name], MD Primary care provider: [name], MD  Reason for consult:  headaches  HISTORY OF PRESENT ILLNESS: [name] is a 78 year old female with HTN, HLD, hypothyroidism, Bipolar 1 disorder, schizoaffective disorder, Alzheimer's disease and history of breast cancer who presents for headaches.  History supplemented by referring provider note.  Onset:  Around April 2020.  No preceding event. Location:  Back of head, usually occurs at night, not during the day.  Non-radiating. Quality:  Non-throbbing Intensity:  Mild to moderate.  She denies thunderclap headache Aura:  none Premonitory Phase:  none Postdrome:  none Associated symptoms:  She denies associated scalp paresthesias/allodynia, nausea, vomiting, photophobia, phonophobia, visual disturbance or unilateral numbness or weakness. Duration:  Feels it as she goes to bed and falls asleep. If she wakes up throughout the night, she may feel it.  Once she gets up in the morning, she feels okay.  No headaches during the day.   Frequency:  Almost every night Triggers:  Laying down Relieving factors:  Getting up. Activity:  Does not aggravate She followed up with her orthopedist, Dr. Gladstone Lighter, who ordered a cervical X-ray and was told she had multilevel arthritis.  She was prescribed ibuprofen.    Current NSAIDS:  Ibuprofen 400mg  twice daily with food Current analgesics:  Tylenol, hydrocodone-acetaminophen Current triptans:  none Current ergotamine:  none Current anti-emetic:  none Current muscle relaxants:  none Current anti-anxiolytic:  alprazolam Current sleep aide:  ramelteon 8mg  Current Antihypertensive medications:  Metoprolol tartrate 25mg  daily, spironolactone Current Antidepressant/antipsychotic medications:  Olanzapine 15mg  twice daily, Seroquel 25mg  daily Current Anticonvulsant medications:  Lamotrigine 150mg   daily Current anti-CGRP:  none Current Vitamins/Herbal/Supplements:  D, Fish  Oil, MVI Current Antihistamines/Decongestants:  none Other therapy:  none Hormone/birth control:  none Other medications:  Synthroid, donepezil, memantine  Past imaging (personally reviewed): 04/27/2013 CT Head:  Atrophy and mild chronic small vessel ischemic changes in white matter. No acute intracranial abnormality. 04/27/2013 CT Cervical Spine:  Moderate to advanced cervical degenerative disease with spondylosis and facet hypertrophy as well as multilevel spinal stenosis most prominent at C5-6 and C6-7, as well as foraminal stenosis bilaterally most prominent at C3-4 and C4-5 and on right at C5-6. 07/05/2009 MRI Brain w/o:  Chronic small vessel ischemic changes in hemispheric white matter and frontal and temporal atrophy.  No acute intracranial abnormality. 10/11/2008 MRI Brain w/o:  Frontal and temporal atrophy and chronic small vessel ischemic changes in hemispheric white matter.  No acute intracranial abnormality.  Past NSAIDS:  Ibuprofen 800mg  twice daily (made her feel unsteady with slurred speech), naproxen 220mg  Past analgesics: none Past abortive triptans:  none Past abortive ergotamine:  none Past muscle relaxants:  Tizanidine 4mg  at bedtime (ineffective) Past anti-emetic:  none Past antihypertensive medications:  none Past antidepressant medications:  none Past anticonvulsant medications:  none Past anti-CGRP:  none Past vitamins/Herbal/Supplements:  none Past antihistamines/decongestants:  none Other past therapies:  none  PAST MEDICAL HISTORY: Past Medical History:  Diagnosis Date  . Anxiety   . Arrhythmia    right bundle branch block  . Bipolar 1 disorder ([place])   . Cancer ([place])   . Hyperlipidemia   . Hypertension   . Morbid obesity ([place])   . Osteoarthritis   . Schizo-affective schizophrenia ([place])   . Thyroid disease    hypothyroidism    PAST SURGICAL HISTORY: Past Surgical History:   Procedure Laterality Date  .  BREAST SURGERY    . CHOLECYSTECTOMY    . DILATION AND CURETTAGE OF UTERUS    . ESOPHAGEAL MANOMETRY N/A 01/29/2018   Procedure: ESOPHAGEAL MANOMETRY (EM);  Surgeon: Wonda Horner, MD;  Location: [facility];  Service: Endoscopy;  Laterality: N/A;  . HAND SURGERY     Right-trigger finger  . IR THORACENTESIS ASP PLEURAL SPACE W/IMG GUIDE  06/28/2018  . IR THORACENTESIS ASP PLEURAL SPACE W/IMG GUIDE  07/19/2018  . KNEE SURGERY     Left  . TONSILLECTOMY      MEDICATIONS: Current Outpatient Medications on File Prior to Visit  Medication Sig Dispense Refill  . acetaminophen (TYLENOL) 500 MG tablet Take 500 mg by mouth every 12 (twelve) hours as needed for mild pain, moderate pain, fever or headache.    . ALPRAZolam (XANAX) 0.5 MG tablet Take 1 mg by mouth 4 (four) times daily as needed for anxiety.     Marland Kitchen CALCIUM MAGNESIUM 750 PO Take 1 tablet by mouth 2 (two) times daily.    Marland Kitchen donepezil (ARICEPT) 10 MG tablet Take 10 mg by mouth every morning.     . fenofibrate (TRICOR) 145 MG tablet TAKE 1 TABLET EVERY TUESDAY,THURSDAY,AND SATURDAY AT 6 PM (Patient taking differently: Take 145 mg by mouth See admin instructions. ) 45 tablet 6  . HYDROcodone-acetaminophen (NORCO/VICODIN) 5-325 MG tablet Take 1 tablet by mouth daily as needed for pain.    Marland Kitchen LACTOBACILLUS PO Take 1 capsule by mouth daily.    Marland Kitchen lamoTRIgine (LAMICTAL) 150 MG tablet Take 150 mg by mouth daily.   12  . memantine (NAMENDA) 10 MG tablet Take 10 mg by mouth 2 (two) times daily.      . metoprolol tartrate (LOPRESSOR) 25 MG tablet TAKE 1 TABLET BY MOUTH EVERY DAY (Patient taking differently: Take 25 mg by mouth 2 (two) times daily. ) 30 tablet 5  . Multiple Vitamins-Minerals (MULTIVITAMIN ADULT PO) Take 1 tablet by mouth daily.    Marland Kitchen OLANZapine (ZYPREXA) 15 MG tablet Take 15 mg by mouth 2 (two) times daily.     . Omega-3 Fatty Acids (FISH OIL) 1000 MG CAPS Take 1,000 mg by mouth daily.    Marland Kitchen omeprazole  (PRILOSEC) 40 MG capsule Take 40 mg by mouth daily.    . QUEtiapine (SEROQUEL) 25 MG tablet Take 25 mg by mouth 3 (three) times daily.     . ramelteon (ROZEREM) 8 MG tablet Take 8 mg by mouth at bedtime as needed for sleep.     Marland Kitchen spironolactone (ALDACTONE) 25 MG tablet TAKE 1 TABLET BY MOUTH DAILY (Patient taking differently: Take 25 mg by mouth daily. ) 90 tablet 3  . SYNTHROID 150 MCG tablet TAKE 1 TABLET (150 MCG TOTAL) BY MOUTH DAILY. (Patient taking differently: Take 150 mcg by mouth daily before breakfast. ) 30 tablet 6   No current facility-administered medications on file prior to visit.     ALLERGIES: Allergies  Allergen Reactions  . Lithium Nausea Only  . Fluoxetine Other (See Comments)    Pt felt crazy  . Macrodantin Other (See Comments)    unknown  . Mirabegron Other (See Comments)    ineffective  . Paroxetine Hcl     Other reaction(s): Other (See Comments) Made pt feel crazy  . Paxil [Paroxetine] Other (See Comments)    Made pt feel crazy   . Prozac [Fluoxetine Hcl] Other (See Comments)    Pt felt crazy   . Sertraline Hcl   . Solifenacin Other (  See Comments)    Ineffective   . Sulfa Antibiotics Other (See Comments)    unknown  . Wellbutrin [Bupropion Hcl] Other (See Comments)    unknown    FAMILY HISTORY: Family History  Problem Relation Age of Onset  . Hypertension Mother    .  SOCIAL HISTORY: Social History   Socioeconomic History  . Marital status: Divorced    Spouse name: Not on file  . Number of children: Not on file  . Years of education: Not on file  . Highest education level: Not on file  Occupational History  . Not on file  Social Needs  . Financial resource strain: Not on file  . Food insecurity    Worry: Not on file    Inability: Not on file  . Transportation needs    Medical: Not on file    Non-medical: Not on file  Tobacco Use  . Smoking status: Former Smoker    Quit date: 12/08/1978    Years since quitting: 40.7  . Smokeless  tobacco: Never Used  Substance and Sexual Activity  . Alcohol use: Yes    Comment: 1 time a year  . Drug use: No  . Sexual activity: Not on file  Lifestyle  . Physical activity    Days per week: Not on file    Minutes per session: Not on file  . Stress: Not on file  Relationships  . Social Herbalist on phone: Not on file    Gets together: Not on file    Attends religious service: Not on file    Active member of club or organization: Not on file    Attends meetings of clubs or organizations: Not on file    Relationship status: Not on file  . Intimate partner violence    Fear of current or ex partner: Not on file    Emotionally abused: Not on file    Physically abused: Not on file    Forced sexual activity: Not on file  Other Topics Concern  . Not on file  Social History Narrative  . Not on file    REVIEW OF SYSTEMS: Constitutional: No fevers, chills, or sweats, no generalized fatigue, change in appetite Eyes: No visual changes, double vision, eye pain Ear, nose and throat: No hearing loss, ear pain, nasal congestion, sore throat Cardiovascular: No chest pain, palpitations Respiratory:  No shortness of breath at rest or with exertion, wheezes GastrointestinaI: No nausea, vomiting, diarrhea, abdominal pain, fecal incontinence Genitourinary:  No dysuria, urinary retention or frequency Musculoskeletal:  No neck pain, back pain Integumentary: No rash, pruritus, skin lesions Neurological: as above Psychiatric: No depression, insomnia, anxiety Endocrine: No palpitations, fatigue, diaphoresis, mood swings, change in appetite, change in weight, increased thirst Hematologic/Lymphatic:  No purpura, petechiae. Allergic/Immunologic: no itchy/runny eyes, nasal congestion, recent allergic reactions, rashes  PHYSICAL EXAM: Blood pressure (!) 145/80, pulse (!) 107, temperature 97.8 F (36.6 C), height 5\' 2"  (1.575 m), weight 186 lb (84.4 kg), SpO2 94 %. General: No acute  distress.  Patient appears well-groomed.   Head:  Normocephalic/atraumatic Eyes:  fundi examined but not visualized Neck: supple, no paraspinal tenderness, full range of motion Back: No paraspinal tenderness Heart: regular rate and rhythm Lungs: Clear to auscultation bilaterally. Vascular: No carotid bruits. Neurological Exam: Mental status: alert and oriented to person, place, and time, recent and remote memory intact, fund of knowledge intact, attention and concentration intact, speech fluent and not dysarthric, language intact. Cranial nerves: CN  I: not tested CN II: pupils equal, round and reactive to light, visual fields intact CN III, IV, VI:  full range of motion, no nystagmus, no ptosis CN V: facial sensation intact CN VII: upper and lower face symmetric CN VIII: hearing intact CN IX, X: gag intact, uvula midline CN XI: sternocleidomastoid and trapezius muscles intact CN XII: tongue midline Bulk & Tone: normal, no fasciculations. Motor:  5/5 throughout  Sensation:  temperature and vibration sensation intact. Deep Tendon Reflexes:  absent throughout, toes downgoing. Finger to nose testing:  Without dysmetria.   Gait:  Unsteady stance and gait.  Requires assistance.  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  Continue ibuprofen 400mg  twice daily for now 2.  Start gabapentin 100mg  at bedtime.  We can increase to 200mg  at bedtime in 4 weeks if needed/tolerated. 3.  Follow up in 3 months.  Thank you for allowing me to take part in the care of this patient.  Metta Clines, DO  CC: [name], MD

## 2019-09-13 ENCOUNTER — Ambulatory Visit (INDEPENDENT_AMBULATORY_CARE_PROVIDER_SITE_OTHER): Payer: Medicare HMO | Admitting: Neurology

## 2019-09-13 ENCOUNTER — Encounter: Payer: Self-pay | Admitting: Neurology

## 2019-09-13 ENCOUNTER — Other Ambulatory Visit: Payer: Self-pay

## 2019-09-13 VITALS — BP 145/80 | HR 107 | Temp 97.8°F | Ht 62.0 in | Wt 186.0 lb

## 2019-09-13 DIAGNOSIS — R519 Headache, unspecified: Secondary | ICD-10-CM | POA: Diagnosis not present

## 2019-09-13 DIAGNOSIS — G4486 Cervicogenic headache: Secondary | ICD-10-CM

## 2019-09-13 DIAGNOSIS — M47812 Spondylosis without myelopathy or radiculopathy, cervical region: Secondary | ICD-10-CM | POA: Diagnosis not present

## 2019-09-13 MED ORDER — GABAPENTIN 100 MG PO CAPS: 100.0000 mg | ORAL_CAPSULE | Freq: Every day | ORAL | 3 refills | Status: DC

## 2019-09-13 NOTE — Patient Instructions (Signed)
I agree that the headaches are due to the arthritis in your neck 1.  Continue ibuprofen 400mg  twice daily for now. 2.  Start gabapentin 100mg  at bedtime.  Caution for dizziness or drowsiness.  If headaches not improved in 4 weeks, contact me and we can increase dose. 3.  Follow up in 3 months.

## 2019-09-26 DIAGNOSIS — D692 Other nonthrombocytopenic purpura: Secondary | ICD-10-CM | POA: Diagnosis not present

## 2019-09-26 DIAGNOSIS — B079 Viral wart, unspecified: Secondary | ICD-10-CM | POA: Diagnosis not present

## 2019-09-26 DIAGNOSIS — L821 Other seborrheic keratosis: Secondary | ICD-10-CM | POA: Diagnosis not present

## 2019-09-26 DIAGNOSIS — D485 Neoplasm of uncertain behavior of skin: Secondary | ICD-10-CM | POA: Diagnosis not present

## 2019-09-26 DIAGNOSIS — Z85828 Personal history of other malignant neoplasm of skin: Secondary | ICD-10-CM | POA: Diagnosis not present

## 2019-10-12 DIAGNOSIS — M25562 Pain in left knee: Secondary | ICD-10-CM | POA: Diagnosis not present

## 2019-10-13 ENCOUNTER — Telehealth: Payer: Self-pay | Admitting: Neurology

## 2019-10-13 NOTE — Telephone Encounter (Signed)
Patient is still experiencing the headaches and wanting to know about increasing the gabapentin. Pharm on file correct. Thanks!

## 2019-10-13 NOTE — Telephone Encounter (Signed)
Patient Instructions by Pieter Partridge, DO at 09/13/2019 2:50 PM  Author: Pieter Partridge, DO Author Type: Physician Filed: 09/13/2019 3:26 PM  Note Status: Signed Cosign: Cosign Not Required Encounter Date: 09/13/2019  Editor: Pieter Partridge, DO (Physician)    I agree that the headaches are due to the arthritis in your neck 1.  Continue ibuprofen 400mg  twice daily for now. 2.  Start gabapentin 100mg  at bedtime.  Caution for dizziness or drowsiness.  If headaches not improved in 4 weeks, contact me and we can increase dose. 3.  Follow up in 3 months.     Increase gabapentin? How much should Rx be increase to?

## 2019-10-13 NOTE — Telephone Encounter (Signed)
We can increase gabapentin to 200mg  at bedtime.  We can increase dose in another 4 weeks if needed.

## 2019-10-14 MED ORDER — GABAPENTIN 100 MG PO CAPS: 200.0000 mg | ORAL_CAPSULE | Freq: Every day | ORAL | 3 refills | Status: DC

## 2019-10-14 NOTE — Telephone Encounter (Signed)
Rx was increase as order by provider and sent to pharmacy patient aware

## 2019-10-20 DIAGNOSIS — Z853 Personal history of malignant neoplasm of breast: Secondary | ICD-10-CM | POA: Diagnosis not present

## 2019-10-20 DIAGNOSIS — Z1231 Encounter for screening mammogram for malignant neoplasm of breast: Secondary | ICD-10-CM | POA: Diagnosis not present

## 2019-11-02 DIAGNOSIS — E782 Mixed hyperlipidemia: Secondary | ICD-10-CM | POA: Diagnosis not present

## 2019-11-02 DIAGNOSIS — G301 Alzheimer's disease with late onset: Secondary | ICD-10-CM | POA: Diagnosis not present

## 2019-11-02 DIAGNOSIS — E7800 Pure hypercholesterolemia, unspecified: Secondary | ICD-10-CM | POA: Diagnosis not present

## 2019-11-02 DIAGNOSIS — I10 Essential (primary) hypertension: Secondary | ICD-10-CM | POA: Diagnosis not present

## 2019-11-02 DIAGNOSIS — E039 Hypothyroidism, unspecified: Secondary | ICD-10-CM | POA: Diagnosis not present

## 2019-11-07 DIAGNOSIS — M25562 Pain in left knee: Secondary | ICD-10-CM | POA: Diagnosis not present

## 2019-11-16 DIAGNOSIS — M6281 Muscle weakness (generalized): Secondary | ICD-10-CM | POA: Diagnosis not present

## 2019-11-16 DIAGNOSIS — M545 Low back pain: Secondary | ICD-10-CM | POA: Diagnosis not present

## 2019-11-16 DIAGNOSIS — M25562 Pain in left knee: Secondary | ICD-10-CM | POA: Diagnosis not present

## 2019-11-16 DIAGNOSIS — R2681 Unsteadiness on feet: Secondary | ICD-10-CM | POA: Diagnosis not present

## 2019-11-18 DIAGNOSIS — G301 Alzheimer's disease with late onset: Secondary | ICD-10-CM | POA: Diagnosis not present

## 2019-11-18 DIAGNOSIS — K219 Gastro-esophageal reflux disease without esophagitis: Secondary | ICD-10-CM | POA: Diagnosis not present

## 2019-11-18 DIAGNOSIS — Z79899 Other long term (current) drug therapy: Secondary | ICD-10-CM | POA: Diagnosis not present

## 2019-11-18 DIAGNOSIS — F259 Schizoaffective disorder, unspecified: Secondary | ICD-10-CM | POA: Diagnosis not present

## 2019-11-18 DIAGNOSIS — K9089 Other intestinal malabsorption: Secondary | ICD-10-CM | POA: Diagnosis not present

## 2019-11-18 DIAGNOSIS — F319 Bipolar disorder, unspecified: Secondary | ICD-10-CM | POA: Diagnosis not present

## 2019-11-18 DIAGNOSIS — E7800 Pure hypercholesterolemia, unspecified: Secondary | ICD-10-CM | POA: Diagnosis not present

## 2019-11-18 DIAGNOSIS — Z0000 Encounter for general adult medical examination without abnormal findings: Secondary | ICD-10-CM | POA: Diagnosis not present

## 2019-11-18 DIAGNOSIS — E039 Hypothyroidism, unspecified: Secondary | ICD-10-CM | POA: Diagnosis not present

## 2019-11-18 DIAGNOSIS — I10 Essential (primary) hypertension: Secondary | ICD-10-CM | POA: Diagnosis not present

## 2019-11-18 DIAGNOSIS — E559 Vitamin D deficiency, unspecified: Secondary | ICD-10-CM | POA: Diagnosis not present

## 2019-11-22 ENCOUNTER — Other Ambulatory Visit: Payer: Self-pay | Admitting: Cardiovascular Disease

## 2019-11-22 ENCOUNTER — Other Ambulatory Visit: Payer: Self-pay | Admitting: Nurse Practitioner

## 2019-11-23 DIAGNOSIS — R2681 Unsteadiness on feet: Secondary | ICD-10-CM | POA: Diagnosis not present

## 2019-11-23 DIAGNOSIS — M25562 Pain in left knee: Secondary | ICD-10-CM | POA: Diagnosis not present

## 2019-11-23 DIAGNOSIS — M545 Low back pain: Secondary | ICD-10-CM | POA: Diagnosis not present

## 2019-11-23 DIAGNOSIS — M6281 Muscle weakness (generalized): Secondary | ICD-10-CM | POA: Diagnosis not present

## 2019-11-25 DIAGNOSIS — M6281 Muscle weakness (generalized): Secondary | ICD-10-CM | POA: Diagnosis not present

## 2019-11-25 DIAGNOSIS — R2681 Unsteadiness on feet: Secondary | ICD-10-CM | POA: Diagnosis not present

## 2019-11-25 DIAGNOSIS — M545 Low back pain: Secondary | ICD-10-CM | POA: Diagnosis not present

## 2019-11-25 DIAGNOSIS — M25562 Pain in left knee: Secondary | ICD-10-CM | POA: Diagnosis not present

## 2019-11-28 DIAGNOSIS — Z1159 Encounter for screening for other viral diseases: Secondary | ICD-10-CM | POA: Diagnosis not present

## 2019-11-28 DIAGNOSIS — M545 Low back pain: Secondary | ICD-10-CM | POA: Diagnosis not present

## 2019-11-28 DIAGNOSIS — M25562 Pain in left knee: Secondary | ICD-10-CM | POA: Diagnosis not present

## 2019-11-28 DIAGNOSIS — M6281 Muscle weakness (generalized): Secondary | ICD-10-CM | POA: Diagnosis not present

## 2019-11-28 DIAGNOSIS — R2681 Unsteadiness on feet: Secondary | ICD-10-CM | POA: Diagnosis not present

## 2019-11-28 DIAGNOSIS — Z20828 Contact with and (suspected) exposure to other viral communicable diseases: Secondary | ICD-10-CM | POA: Diagnosis not present

## 2019-11-30 DIAGNOSIS — M545 Low back pain: Secondary | ICD-10-CM | POA: Diagnosis not present

## 2019-11-30 DIAGNOSIS — M25562 Pain in left knee: Secondary | ICD-10-CM | POA: Diagnosis not present

## 2019-11-30 DIAGNOSIS — M6281 Muscle weakness (generalized): Secondary | ICD-10-CM | POA: Diagnosis not present

## 2019-11-30 DIAGNOSIS — R2681 Unsteadiness on feet: Secondary | ICD-10-CM | POA: Diagnosis not present

## 2019-12-05 DIAGNOSIS — M25562 Pain in left knee: Secondary | ICD-10-CM | POA: Diagnosis not present

## 2019-12-05 DIAGNOSIS — M6281 Muscle weakness (generalized): Secondary | ICD-10-CM | POA: Diagnosis not present

## 2019-12-05 DIAGNOSIS — R2681 Unsteadiness on feet: Secondary | ICD-10-CM | POA: Diagnosis not present

## 2019-12-05 DIAGNOSIS — Z1159 Encounter for screening for other viral diseases: Secondary | ICD-10-CM | POA: Diagnosis not present

## 2019-12-05 DIAGNOSIS — Z20828 Contact with and (suspected) exposure to other viral communicable diseases: Secondary | ICD-10-CM | POA: Diagnosis not present

## 2019-12-05 DIAGNOSIS — M545 Low back pain: Secondary | ICD-10-CM | POA: Diagnosis not present

## 2019-12-07 DIAGNOSIS — M25562 Pain in left knee: Secondary | ICD-10-CM | POA: Diagnosis not present

## 2019-12-07 DIAGNOSIS — M6281 Muscle weakness (generalized): Secondary | ICD-10-CM | POA: Diagnosis not present

## 2019-12-07 DIAGNOSIS — M545 Low back pain: Secondary | ICD-10-CM | POA: Diagnosis not present

## 2019-12-07 DIAGNOSIS — R2681 Unsteadiness on feet: Secondary | ICD-10-CM | POA: Diagnosis not present

## 2019-12-12 DIAGNOSIS — M6281 Muscle weakness (generalized): Secondary | ICD-10-CM | POA: Diagnosis not present

## 2019-12-12 DIAGNOSIS — M545 Low back pain: Secondary | ICD-10-CM | POA: Diagnosis not present

## 2019-12-12 DIAGNOSIS — M25562 Pain in left knee: Secondary | ICD-10-CM | POA: Diagnosis not present

## 2019-12-12 DIAGNOSIS — Z1159 Encounter for screening for other viral diseases: Secondary | ICD-10-CM | POA: Diagnosis not present

## 2019-12-12 DIAGNOSIS — Z20828 Contact with and (suspected) exposure to other viral communicable diseases: Secondary | ICD-10-CM | POA: Diagnosis not present

## 2019-12-12 DIAGNOSIS — R2681 Unsteadiness on feet: Secondary | ICD-10-CM | POA: Diagnosis not present

## 2019-12-14 DIAGNOSIS — M6281 Muscle weakness (generalized): Secondary | ICD-10-CM | POA: Diagnosis not present

## 2019-12-14 DIAGNOSIS — R2681 Unsteadiness on feet: Secondary | ICD-10-CM | POA: Diagnosis not present

## 2019-12-14 DIAGNOSIS — M545 Low back pain: Secondary | ICD-10-CM | POA: Diagnosis not present

## 2019-12-14 DIAGNOSIS — M25562 Pain in left knee: Secondary | ICD-10-CM | POA: Diagnosis not present

## 2019-12-15 DIAGNOSIS — Z20828 Contact with and (suspected) exposure to other viral communicable diseases: Secondary | ICD-10-CM | POA: Diagnosis not present

## 2019-12-15 DIAGNOSIS — H40013 Open angle with borderline findings, low risk, bilateral: Secondary | ICD-10-CM | POA: Diagnosis not present

## 2019-12-15 DIAGNOSIS — Z1159 Encounter for screening for other viral diseases: Secondary | ICD-10-CM | POA: Diagnosis not present

## 2019-12-19 DIAGNOSIS — Z1159 Encounter for screening for other viral diseases: Secondary | ICD-10-CM | POA: Diagnosis not present

## 2019-12-19 DIAGNOSIS — Z20828 Contact with and (suspected) exposure to other viral communicable diseases: Secondary | ICD-10-CM | POA: Diagnosis not present

## 2019-12-22 ENCOUNTER — Other Ambulatory Visit: Payer: Self-pay | Admitting: Cardiology

## 2019-12-22 DIAGNOSIS — R2681 Unsteadiness on feet: Secondary | ICD-10-CM | POA: Diagnosis not present

## 2019-12-22 DIAGNOSIS — M545 Low back pain: Secondary | ICD-10-CM | POA: Diagnosis not present

## 2019-12-22 DIAGNOSIS — Z1159 Encounter for screening for other viral diseases: Secondary | ICD-10-CM | POA: Diagnosis not present

## 2019-12-22 DIAGNOSIS — M6281 Muscle weakness (generalized): Secondary | ICD-10-CM | POA: Diagnosis not present

## 2019-12-22 DIAGNOSIS — M25562 Pain in left knee: Secondary | ICD-10-CM | POA: Diagnosis not present

## 2019-12-22 DIAGNOSIS — Z20828 Contact with and (suspected) exposure to other viral communicable diseases: Secondary | ICD-10-CM | POA: Diagnosis not present

## 2019-12-26 ENCOUNTER — Other Ambulatory Visit: Payer: Self-pay | Admitting: Nurse Practitioner

## 2019-12-26 DIAGNOSIS — Z20828 Contact with and (suspected) exposure to other viral communicable diseases: Secondary | ICD-10-CM | POA: Diagnosis not present

## 2019-12-26 DIAGNOSIS — Z1159 Encounter for screening for other viral diseases: Secondary | ICD-10-CM | POA: Diagnosis not present

## 2019-12-26 DIAGNOSIS — R2681 Unsteadiness on feet: Secondary | ICD-10-CM | POA: Diagnosis not present

## 2019-12-26 DIAGNOSIS — M6281 Muscle weakness (generalized): Secondary | ICD-10-CM | POA: Diagnosis not present

## 2019-12-26 DIAGNOSIS — M545 Low back pain: Secondary | ICD-10-CM | POA: Diagnosis not present

## 2019-12-26 DIAGNOSIS — M25562 Pain in left knee: Secondary | ICD-10-CM | POA: Diagnosis not present

## 2019-12-29 DIAGNOSIS — G301 Alzheimer's disease with late onset: Secondary | ICD-10-CM | POA: Diagnosis not present

## 2019-12-29 DIAGNOSIS — E782 Mixed hyperlipidemia: Secondary | ICD-10-CM | POA: Diagnosis not present

## 2019-12-29 DIAGNOSIS — M545 Low back pain: Secondary | ICD-10-CM | POA: Diagnosis not present

## 2019-12-29 DIAGNOSIS — I10 Essential (primary) hypertension: Secondary | ICD-10-CM | POA: Diagnosis not present

## 2019-12-29 DIAGNOSIS — M25562 Pain in left knee: Secondary | ICD-10-CM | POA: Diagnosis not present

## 2019-12-29 DIAGNOSIS — E039 Hypothyroidism, unspecified: Secondary | ICD-10-CM | POA: Diagnosis not present

## 2019-12-29 DIAGNOSIS — R2681 Unsteadiness on feet: Secondary | ICD-10-CM | POA: Diagnosis not present

## 2019-12-29 DIAGNOSIS — M6281 Muscle weakness (generalized): Secondary | ICD-10-CM | POA: Diagnosis not present

## 2020-01-02 DIAGNOSIS — M545 Low back pain: Secondary | ICD-10-CM | POA: Diagnosis not present

## 2020-01-02 DIAGNOSIS — M6281 Muscle weakness (generalized): Secondary | ICD-10-CM | POA: Diagnosis not present

## 2020-01-02 DIAGNOSIS — M25562 Pain in left knee: Secondary | ICD-10-CM | POA: Diagnosis not present

## 2020-01-02 DIAGNOSIS — Z20828 Contact with and (suspected) exposure to other viral communicable diseases: Secondary | ICD-10-CM | POA: Diagnosis not present

## 2020-01-02 DIAGNOSIS — R2681 Unsteadiness on feet: Secondary | ICD-10-CM | POA: Diagnosis not present

## 2020-01-02 DIAGNOSIS — Z1159 Encounter for screening for other viral diseases: Secondary | ICD-10-CM | POA: Diagnosis not present

## 2020-01-04 DIAGNOSIS — M6281 Muscle weakness (generalized): Secondary | ICD-10-CM | POA: Diagnosis not present

## 2020-01-04 DIAGNOSIS — M545 Low back pain: Secondary | ICD-10-CM | POA: Diagnosis not present

## 2020-01-04 DIAGNOSIS — R2681 Unsteadiness on feet: Secondary | ICD-10-CM | POA: Diagnosis not present

## 2020-01-04 DIAGNOSIS — M25562 Pain in left knee: Secondary | ICD-10-CM | POA: Diagnosis not present

## 2020-01-05 DIAGNOSIS — M545 Low back pain: Secondary | ICD-10-CM | POA: Diagnosis not present

## 2020-01-05 DIAGNOSIS — M1712 Unilateral primary osteoarthritis, left knee: Secondary | ICD-10-CM | POA: Diagnosis not present

## 2020-01-05 NOTE — Progress Notes (Signed)
Due to the COVID-19 crisis, this virtual check-in visit was done via telephone from my office and it was initiated and consent given by this patient and or family.   Telephone (Audio) Visit The purpose of this telephone visit is to provide medical care while limiting exposure to the novel coronavirus.    Consent was obtained for telephone visit and initiated by pt/family:  Yes.   Answered questions that patient had about telehealth interaction:  Yes.   I discussed the limitations, risks, security and privacy concerns of performing an evaluation and management service by telephone. I also discussed with the patient that there may be a patient responsible charge related to this service. The patient expressed understanding and agreed to proceed.  Pt location: Home Physician Location: office Name of referring provider:  [name], MD I connected with .[name] at patients initiation/request on 01/06/2020 at  9:30 AM EST by telephone and verified that I am speaking with the correct person using two identifiers.  Pt MRN:  [id number] Pt DOB:  1941-03-08   History of Present Illness:  [name] is a 79 year old female with HTN, HLD, hypothyroidism, Bipolar 1 disorder, schizoaffective disorder, Alzheimer's disease and history of breast cancer who follows up for headaches.  UPDATE: Started on gabapentin in October, which subsequently was increased to 200mg  at bedtime.  Headaches are improved Intensity:  mild Duration:  An hour Frequency:  Once every 5 nights.  She reports poor appetite for last month.  She reports increased nausea as well.  No abdominal pain, vomiting or diarrhea.  She started drinking an Ensure.  Dr. Felipa Eth decreased the donepezil to 5mg  last week.  She still reports nausea and decreased appetite.  Current NSAIDS:  Ibuprofen 400mg  twice daily with food Current analgesics:  Tylenol, hydrocodone-acetaminophen Current triptans:  none Current ergotamine:   none Current anti-emetic:  none Current muscle relaxants:  none Current anti-anxiolytic:  alprazolam Current sleep aide:  ramelteon 8mg  Current Antihypertensive medications:  Metoprolol tartrate 25mg  daily, spironolactone Current Antidepressant/antipsychotic medications:  Olanzapine 15mg  twice daily, Seroquel 25mg  daily Current Anticonvulsant medications:   Gabapentin 200 mg at bedtime; lamotrigine 150mg  daily Current anti-CGRP:  none Current Vitamins/Herbal/Supplements:  D, Fish  Oil, MVI Current Antihistamines/Decongestants:  none Other therapy:  none Hormone/birth control:  none Other medications:  Synthroid, donepezil, memantine   HISTORY: Onset:  Around April 2020.  No preceding event. Location:  Back of head, usually occurs at night, not during the day.  Non-radiating. Quality:  Non-throbbing Initial intensity:  Mild to moderate.  She denies thunderclap headache Aura:  none Premonitory Phase:  none Postdrome:  none Associated symptoms:  She denies associated scalp paresthesias/allodynia, nausea, vomiting, photophobia, phonophobia, visual disturbance or unilateral numbness or weakness. Initial duration:  Feels it as she goes to bed and falls asleep. If she wakes up throughout the night, she may feel it.  Once she gets up in the morning, she feels okay.  No headaches during the day.   Initial Frequency:  Almost every night Triggers:  Laying down Relieving factors:  Getting up. Activity:  Does not aggravate She followed up with her orthopedist, Dr. Gladstone Lighter, who ordered a cervical X-ray and was told she had multilevel arthritis.  She was prescribed ibuprofen.     Past imaging (personally reviewed): 04/27/2013 CT Head:  Atrophy and mild chronic small vessel ischemic changes in white matter. No acute intracranial abnormality. 04/27/2013 CT Cervical Spine:  Moderate to advanced cervical degenerative disease with spondylosis and  facet hypertrophy as well as multilevel spinal  stenosis most prominent at C5-6 and C6-7, as well as foraminal stenosis bilaterally most prominent at C3-4 and C4-5 and on right at C5-6. 07/05/2009 MRI Brain w/o:  Chronic small vessel ischemic changes in hemispheric white matter and frontal and temporal atrophy.  No acute intracranial abnormality. 10/11/2008 MRI Brain w/o:  Frontal and temporal atrophy and chronic small vessel ischemic changes in hemispheric white matter.  No acute intracranial abnormality.  Past NSAIDS:  Ibuprofen 800mg  twice daily (made her feel unsteady with slurred speech), naproxen 220mg  Past analgesics: none Past abortive triptans:  none Past abortive ergotamine:  none Past muscle relaxants:  Tizanidine 4mg  at bedtime (ineffective) Past anti-emetic:  none Past antihypertensive medications:  none Past antidepressant medications:  none Past anticonvulsant medications:  none Past anti-CGRP:  none Past vitamins/Herbal/Supplements:  none Past antihistamines/decongestants:  none Other past therapies:  none    Observations/Objective:   There were no vitals taken for this visit.  Assessment and Plan:   1.  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  Poor appetite/nausea.  No new or changed medications started just prior to onset.  If does not improve in 1 to 2 weeks, recommend stopping the donepezil completely (advised to run it by Dr. Felipa Eth first).  1.  Gabapentin 200mg  at bedtime 2.  Follow up in 6 months.  Follow Up Instructions:    -I discussed the assessment and treatment plan with the patient. The patient was provided an opportunity to ask questions and all were answered. The patient agreed with the plan and demonstrated an understanding of the instructions.   The patient was advised to call back or seek an in-person evaluation if the symptoms worsen or if the condition fails to improve as anticipated.    Total Time spent in visit with the patient was:  9 minutes  Dudley Major,  DO

## 2020-01-06 ENCOUNTER — Encounter: Payer: Self-pay | Admitting: Neurology

## 2020-01-06 ENCOUNTER — Other Ambulatory Visit: Payer: Self-pay | Admitting: Cardiology

## 2020-01-06 ENCOUNTER — Telehealth (INDEPENDENT_AMBULATORY_CARE_PROVIDER_SITE_OTHER): Payer: Medicare HMO | Admitting: Neurology

## 2020-01-06 ENCOUNTER — Other Ambulatory Visit: Payer: Self-pay

## 2020-01-06 DIAGNOSIS — R110 Nausea: Secondary | ICD-10-CM | POA: Diagnosis not present

## 2020-01-06 DIAGNOSIS — R519 Headache, unspecified: Secondary | ICD-10-CM | POA: Diagnosis not present

## 2020-01-06 DIAGNOSIS — M47812 Spondylosis without myelopathy or radiculopathy, cervical region: Secondary | ICD-10-CM

## 2020-01-06 DIAGNOSIS — G4486 Cervicogenic headache: Secondary | ICD-10-CM

## 2020-01-09 DIAGNOSIS — Z1159 Encounter for screening for other viral diseases: Secondary | ICD-10-CM | POA: Diagnosis not present

## 2020-01-09 DIAGNOSIS — R2681 Unsteadiness on feet: Secondary | ICD-10-CM | POA: Diagnosis not present

## 2020-01-09 DIAGNOSIS — M25562 Pain in left knee: Secondary | ICD-10-CM | POA: Diagnosis not present

## 2020-01-09 DIAGNOSIS — Z20828 Contact with and (suspected) exposure to other viral communicable diseases: Secondary | ICD-10-CM | POA: Diagnosis not present

## 2020-01-09 DIAGNOSIS — M6281 Muscle weakness (generalized): Secondary | ICD-10-CM | POA: Diagnosis not present

## 2020-01-09 DIAGNOSIS — M545 Low back pain: Secondary | ICD-10-CM | POA: Diagnosis not present

## 2020-01-11 DIAGNOSIS — M6281 Muscle weakness (generalized): Secondary | ICD-10-CM | POA: Diagnosis not present

## 2020-01-11 DIAGNOSIS — M545 Low back pain: Secondary | ICD-10-CM | POA: Diagnosis not present

## 2020-01-11 DIAGNOSIS — R2681 Unsteadiness on feet: Secondary | ICD-10-CM | POA: Diagnosis not present

## 2020-01-11 DIAGNOSIS — M25562 Pain in left knee: Secondary | ICD-10-CM | POA: Diagnosis not present

## 2020-01-16 DIAGNOSIS — R2681 Unsteadiness on feet: Secondary | ICD-10-CM | POA: Diagnosis not present

## 2020-01-16 DIAGNOSIS — M545 Low back pain: Secondary | ICD-10-CM | POA: Diagnosis not present

## 2020-01-16 DIAGNOSIS — M6281 Muscle weakness (generalized): Secondary | ICD-10-CM | POA: Diagnosis not present

## 2020-01-16 DIAGNOSIS — M25562 Pain in left knee: Secondary | ICD-10-CM | POA: Diagnosis not present

## 2020-01-16 DIAGNOSIS — Z1159 Encounter for screening for other viral diseases: Secondary | ICD-10-CM | POA: Diagnosis not present

## 2020-01-16 DIAGNOSIS — Z20828 Contact with and (suspected) exposure to other viral communicable diseases: Secondary | ICD-10-CM | POA: Diagnosis not present

## 2020-01-18 DIAGNOSIS — M6281 Muscle weakness (generalized): Secondary | ICD-10-CM | POA: Diagnosis not present

## 2020-01-18 DIAGNOSIS — M545 Low back pain: Secondary | ICD-10-CM | POA: Diagnosis not present

## 2020-01-18 DIAGNOSIS — M25562 Pain in left knee: Secondary | ICD-10-CM | POA: Diagnosis not present

## 2020-01-18 DIAGNOSIS — R2681 Unsteadiness on feet: Secondary | ICD-10-CM | POA: Diagnosis not present

## 2020-01-21 ENCOUNTER — Other Ambulatory Visit: Payer: Self-pay | Admitting: Nurse Practitioner

## 2020-01-23 DIAGNOSIS — Z1159 Encounter for screening for other viral diseases: Secondary | ICD-10-CM | POA: Diagnosis not present

## 2020-01-23 DIAGNOSIS — M25562 Pain in left knee: Secondary | ICD-10-CM | POA: Diagnosis not present

## 2020-01-23 DIAGNOSIS — R2681 Unsteadiness on feet: Secondary | ICD-10-CM | POA: Diagnosis not present

## 2020-01-23 DIAGNOSIS — M545 Low back pain: Secondary | ICD-10-CM | POA: Diagnosis not present

## 2020-01-23 DIAGNOSIS — M6281 Muscle weakness (generalized): Secondary | ICD-10-CM | POA: Diagnosis not present

## 2020-01-23 DIAGNOSIS — Z20828 Contact with and (suspected) exposure to other viral communicable diseases: Secondary | ICD-10-CM | POA: Diagnosis not present

## 2020-01-25 DIAGNOSIS — R2681 Unsteadiness on feet: Secondary | ICD-10-CM | POA: Diagnosis not present

## 2020-01-25 DIAGNOSIS — M545 Low back pain: Secondary | ICD-10-CM | POA: Diagnosis not present

## 2020-01-25 DIAGNOSIS — M6281 Muscle weakness (generalized): Secondary | ICD-10-CM | POA: Diagnosis not present

## 2020-01-25 DIAGNOSIS — M25562 Pain in left knee: Secondary | ICD-10-CM | POA: Diagnosis not present

## 2020-01-30 DIAGNOSIS — M545 Low back pain: Secondary | ICD-10-CM | POA: Diagnosis not present

## 2020-01-30 DIAGNOSIS — R2681 Unsteadiness on feet: Secondary | ICD-10-CM | POA: Diagnosis not present

## 2020-01-30 DIAGNOSIS — M6281 Muscle weakness (generalized): Secondary | ICD-10-CM | POA: Diagnosis not present

## 2020-01-30 DIAGNOSIS — Z1159 Encounter for screening for other viral diseases: Secondary | ICD-10-CM | POA: Diagnosis not present

## 2020-01-30 DIAGNOSIS — M25562 Pain in left knee: Secondary | ICD-10-CM | POA: Diagnosis not present

## 2020-01-30 DIAGNOSIS — Z20828 Contact with and (suspected) exposure to other viral communicable diseases: Secondary | ICD-10-CM | POA: Diagnosis not present

## 2020-02-01 DIAGNOSIS — M25562 Pain in left knee: Secondary | ICD-10-CM | POA: Diagnosis not present

## 2020-02-01 DIAGNOSIS — R2681 Unsteadiness on feet: Secondary | ICD-10-CM | POA: Diagnosis not present

## 2020-02-01 DIAGNOSIS — M545 Low back pain: Secondary | ICD-10-CM | POA: Diagnosis not present

## 2020-02-01 DIAGNOSIS — M6281 Muscle weakness (generalized): Secondary | ICD-10-CM | POA: Diagnosis not present

## 2020-02-02 DIAGNOSIS — Z1159 Encounter for screening for other viral diseases: Secondary | ICD-10-CM | POA: Diagnosis not present

## 2020-02-02 DIAGNOSIS — Z20828 Contact with and (suspected) exposure to other viral communicable diseases: Secondary | ICD-10-CM | POA: Diagnosis not present

## 2020-02-06 DIAGNOSIS — M6281 Muscle weakness (generalized): Secondary | ICD-10-CM | POA: Diagnosis not present

## 2020-02-06 DIAGNOSIS — R2681 Unsteadiness on feet: Secondary | ICD-10-CM | POA: Diagnosis not present

## 2020-02-06 DIAGNOSIS — M25562 Pain in left knee: Secondary | ICD-10-CM | POA: Diagnosis not present

## 2020-02-06 DIAGNOSIS — M545 Low back pain: Secondary | ICD-10-CM | POA: Diagnosis not present

## 2020-02-06 DIAGNOSIS — Z1159 Encounter for screening for other viral diseases: Secondary | ICD-10-CM | POA: Diagnosis not present

## 2020-02-06 DIAGNOSIS — Z20828 Contact with and (suspected) exposure to other viral communicable diseases: Secondary | ICD-10-CM | POA: Diagnosis not present

## 2020-02-10 DIAGNOSIS — I10 Essential (primary) hypertension: Secondary | ICD-10-CM | POA: Diagnosis not present

## 2020-02-10 DIAGNOSIS — F319 Bipolar disorder, unspecified: Secondary | ICD-10-CM | POA: Diagnosis not present

## 2020-02-10 DIAGNOSIS — G301 Alzheimer's disease with late onset: Secondary | ICD-10-CM | POA: Diagnosis not present

## 2020-02-10 DIAGNOSIS — E039 Hypothyroidism, unspecified: Secondary | ICD-10-CM | POA: Diagnosis not present

## 2020-02-10 DIAGNOSIS — E782 Mixed hyperlipidemia: Secondary | ICD-10-CM | POA: Diagnosis not present

## 2020-02-13 DIAGNOSIS — M25562 Pain in left knee: Secondary | ICD-10-CM | POA: Diagnosis not present

## 2020-02-13 DIAGNOSIS — M545 Low back pain: Secondary | ICD-10-CM | POA: Diagnosis not present

## 2020-02-13 DIAGNOSIS — R2681 Unsteadiness on feet: Secondary | ICD-10-CM | POA: Diagnosis not present

## 2020-02-13 DIAGNOSIS — M6281 Muscle weakness (generalized): Secondary | ICD-10-CM | POA: Diagnosis not present

## 2020-02-13 DIAGNOSIS — Z1159 Encounter for screening for other viral diseases: Secondary | ICD-10-CM | POA: Diagnosis not present

## 2020-02-13 DIAGNOSIS — Z20828 Contact with and (suspected) exposure to other viral communicable diseases: Secondary | ICD-10-CM | POA: Diagnosis not present

## 2020-02-15 ENCOUNTER — Other Ambulatory Visit: Payer: Self-pay | Admitting: Nurse Practitioner

## 2020-02-18 ENCOUNTER — Other Ambulatory Visit: Payer: Self-pay | Admitting: Cardiology

## 2020-02-20 DIAGNOSIS — M545 Low back pain: Secondary | ICD-10-CM | POA: Diagnosis not present

## 2020-02-20 DIAGNOSIS — M25562 Pain in left knee: Secondary | ICD-10-CM | POA: Diagnosis not present

## 2020-02-20 DIAGNOSIS — R2681 Unsteadiness on feet: Secondary | ICD-10-CM | POA: Diagnosis not present

## 2020-02-20 DIAGNOSIS — Z20828 Contact with and (suspected) exposure to other viral communicable diseases: Secondary | ICD-10-CM | POA: Diagnosis not present

## 2020-02-20 DIAGNOSIS — Z1159 Encounter for screening for other viral diseases: Secondary | ICD-10-CM | POA: Diagnosis not present

## 2020-02-20 DIAGNOSIS — M6281 Muscle weakness (generalized): Secondary | ICD-10-CM | POA: Diagnosis not present

## 2020-02-22 ENCOUNTER — Emergency Department (HOSPITAL_COMMUNITY)
Admission: EM | Admit: 2020-02-22 | Discharge: 2020-02-22 | Disposition: A | Discharge status: Home / Self Care | Payer: Medicare HMO | Attending: Emergency Medicine | Admitting: Emergency Medicine

## 2020-02-22 ENCOUNTER — Other Ambulatory Visit: Payer: Self-pay

## 2020-02-22 DIAGNOSIS — W19XXXA Unspecified fall, initial encounter: Secondary | ICD-10-CM | POA: Diagnosis not present

## 2020-02-22 DIAGNOSIS — E039 Hypothyroidism, unspecified: Secondary | ICD-10-CM | POA: Diagnosis not present

## 2020-02-22 DIAGNOSIS — Z853 Personal history of malignant neoplasm of breast: Secondary | ICD-10-CM | POA: Diagnosis not present

## 2020-02-22 DIAGNOSIS — F0390 Unspecified dementia without behavioral disturbance: Secondary | ICD-10-CM | POA: Insufficient documentation

## 2020-02-22 DIAGNOSIS — Z7401 Bed confinement status: Secondary | ICD-10-CM | POA: Diagnosis not present

## 2020-02-22 DIAGNOSIS — I10 Essential (primary) hypertension: Secondary | ICD-10-CM | POA: Diagnosis not present

## 2020-02-22 DIAGNOSIS — M2550 Pain in unspecified joint: Secondary | ICD-10-CM | POA: Diagnosis not present

## 2020-02-22 DIAGNOSIS — I5032 Chronic diastolic (congestive) heart failure: Secondary | ICD-10-CM | POA: Insufficient documentation

## 2020-02-22 DIAGNOSIS — Z87891 Personal history of nicotine dependence: Secondary | ICD-10-CM | POA: Insufficient documentation

## 2020-02-22 DIAGNOSIS — Z79899 Other long term (current) drug therapy: Secondary | ICD-10-CM | POA: Diagnosis not present

## 2020-02-22 DIAGNOSIS — I110 Hypertensive heart disease with heart failure: Secondary | ICD-10-CM | POA: Insufficient documentation

## 2020-02-22 DIAGNOSIS — R2681 Unsteadiness on feet: Secondary | ICD-10-CM | POA: Insufficient documentation

## 2020-02-22 LAB — CBC WITH DIFFERENTIAL/PLATELET
Abs Immature Granulocytes: 0.03 10*3/uL (ref 0.00–0.07)
Basophils Absolute: 0 10*3/uL (ref 0.0–0.1)
Basophils Relative: 0 %
Eosinophils Absolute: 0 10*3/uL (ref 0.0–0.5)
Eosinophils Relative: 0 %
HCT: 44.2 % (ref 36.0–46.0)
Hemoglobin: 14.4 g/dL (ref 12.0–15.0)
Immature Granulocytes: 0 %
Lymphocytes Relative: 26 %
Lymphs Abs: 1.9 10*3/uL (ref 0.7–4.0)
MCH: 31.6 pg (ref 26.0–34.0)
MCHC: 32.6 g/dL (ref 30.0–36.0)
MCV: 97.1 fL (ref 80.0–100.0)
Monocytes Absolute: 0.7 10*3/uL (ref 0.1–1.0)
Monocytes Relative: 9 %
Neutro Abs: 4.8 10*3/uL (ref 1.7–7.7)
Neutrophils Relative %: 65 %
Platelets: 223 10*3/uL (ref 150–400)
RBC: 4.55 MIL/uL (ref 3.87–5.11)
RDW: 13.4 % (ref 11.5–15.5)
WBC: 7.5 10*3/uL (ref 4.0–10.5)
nRBC: 0 % (ref 0.0–0.2)

## 2020-02-22 LAB — BASIC METABOLIC PANEL
Anion gap: 15 (ref 5–15)
BUN: 15 mg/dL (ref 8–23)
CO2: 24 mmol/L (ref 22–32)
Calcium: 10.2 mg/dL (ref 8.9–10.3)
Chloride: 105 mmol/L (ref 98–111)
Creatinine, Ser: 0.89 mg/dL (ref 0.44–1.00)
GFR calc Af Amer: >60 mL/min (ref >60)
GFR calc non Af Amer: >60 mL/min (ref >60)
Glucose, Bld: 123 mg/dL — ABNORMAL HIGH (ref 70–99)
Potassium: 4 mmol/L (ref 3.5–5.1)
Sodium: 144 mmol/L (ref 135–145)

## 2020-02-22 MED ORDER — METOPROLOL TARTRATE 25 MG PO TABS
25.0000 mg | ORAL_TABLET | Freq: Once | ORAL | Status: AC
  Administered 2020-02-22: 21:00:00 25 mg via ORAL
  Filled 2020-02-22: qty 1

## 2020-02-22 MED ORDER — METOPROLOL SUCCINATE ER 25 MG PO TB24: 25.0000 mg | ORAL_TABLET | Freq: Two times a day (BID) | ORAL | 0 refills | Status: DC

## 2020-02-22 NOTE — ED Notes (Signed)
Pt to go home with [facility] at this time

## 2020-02-22 NOTE — ED Notes (Signed)
Attempted to call report to [facility]. Per staff no RN available to take report. Informed CNA Pt to return to facility via [facility].

## 2020-02-22 NOTE — ED Notes (Signed)
Completed ortho VS. Pt stable with front wheel walker, denied dizziness while changing positions

## 2020-02-22 NOTE — Discharge Instructions (Addendum)
Follow-up with your doctor soon as possible about your blood pressure and possible changes to the medications.  Return here for any worsening in your condition.

## 2020-02-22 NOTE — ED Provider Notes (Signed)
[facility] EMERGENCY DEPARTMENT Provider Note   CSN: [id number] Arrival date & time: 02/22/20  1809     History Chief Complaint  Patient presents with  . Hypertension    [name] is a 79 y.o. female.  HPI Patient presents to the emergency department with feeling over the last 2 days where she might have a little bit more sensation of balance issues.  The patient states that she has been doing rehab for this.  Patient states she feels like it may be more unsteadiness than she normally has.  The patient is unable to quantify.  She states she does not have dizziness or headache.  She states she does not have any lightheadedness or near syncope.  Patient states that she was noted to have elevated blood pressures by her physical therapist.  Patient states that nothing seems to make the condition better or worse.  She states that she is had no new medications since the symptoms started.  The patient denies chest pain, shortness of breath, headache,blurred vision, neck pain, fever, cough, weakness, numbness, dizziness, anorexia, edema, abdominal pain, nausea, vomiting, diarrhea, rash, back pain, dysuria, hematemesis, bloody stool, near syncope, or syncope.    Past Medical History:  Diagnosis Date  . Anxiety   . Arrhythmia    right bundle branch block  . Bipolar 1 disorder ([place])   . Cancer ([place])   . Hyperlipidemia   . Hypertension   . Morbid obesity ([place])   . Osteoarthritis   . Schizo-affective schizophrenia ([place])   . Thyroid disease    hypothyroidism    Patient Active Problem List   Diagnosis Date Noted  . Sepsis ([place]) 02/13/2019  . HLD (hyperlipidemia) 02/13/2019  . Anxiety 02/13/2019  . Chronic diastolic CHF (congestive heart failure) ([place]) 02/13/2019  . GERD (gastroesophageal reflux disease) 02/13/2019  . Nausea vomiting and diarrhea 02/13/2019  . Schizo-affective schizophrenia ([place])   . Bipolar 1 disorder ([place])   . Pleural effusion on right  06/28/2018  . Osteoarthritis of left knee 11/09/2014  . Nocturia more than twice per night 11/09/2014  . Right bundle branch block 02/23/2013  . Irritable bowel syndrome with constipation and diarrhea 10/28/2012  . Breast cancer ([place]) 04/07/2012  . Dermatitis 12/24/2011  . Hypercholesterolemia 04/30/2011  . Hypothyroidism 04/30/2011  . Dementia ([place]) 04/30/2011  . Benign hypertensive heart disease without heart failure 04/30/2011    Past Surgical History:  Procedure Laterality Date  . BREAST SURGERY    . CHOLECYSTECTOMY    . DILATION AND CURETTAGE OF UTERUS    . ESOPHAGEAL MANOMETRY N/A 01/29/2018   Procedure: ESOPHAGEAL MANOMETRY (EM);  Surgeon: Wonda Horner, MD;  Location: [facility];  Service: Endoscopy;  Laterality: N/A;  . HAND SURGERY     Right-trigger finger  . IR THORACENTESIS ASP PLEURAL SPACE W/IMG GUIDE  06/28/2018  . IR THORACENTESIS ASP PLEURAL SPACE W/IMG GUIDE  07/19/2018  . KNEE SURGERY     Left  . TONSILLECTOMY       OB History   No obstetric history on file.     Family History  Problem Relation Age of Onset  . Hypertension Mother     Social History   Tobacco Use  . Smoking status: Former Smoker    Quit date: 12/08/1978    Years since quitting: 41.2  . Smokeless tobacco: Never Used  Substance Use Topics  . Alcohol use: Yes    Comment: 1 time a year  . Drug use: No  Home Medications Prior to Admission medications   Medication Sig Start Date End Date Taking? Authorizing Provider  acetaminophen (TYLENOL) 500 MG tablet Take 500 mg by mouth every 12 (twelve) hours as needed for mild pain, moderate pain, fever or headache.    [provider]  ALPRAZolam Duanne Moron) 0.5 MG tablet Take 1 mg by mouth 4 (four) times daily as needed for anxiety.     [provider]  CALCIUM MAGNESIUM 750 PO Take 1 tablet by mouth 2 (two) times daily.    [provider]  cholecalciferol (VITAMIN D3) 25 MCG (1000 UT) tablet Take 1,000 Units by  mouth daily. Wasn't sure if 1000 or 2000 units daily    [provider]  donepezil (ARICEPT) 10 MG tablet Take 10 mg by mouth every morning.     [provider]  fenofibrate (TRICOR) 145 MG tablet TAKE 1 TABLET EVERY TUESDAY,THURSDAY,AND SATURDAY AT 6 PM 11/24/19   Skeet Latch, MD  gabapentin (NEURONTIN) 100 MG capsule Take 2 capsules (200 mg total) by mouth at bedtime. 10/14/19   Pieter Partridge, DO  HYDROcodone-acetaminophen (NORCO/VICODIN) 5-325 MG tablet Take 1 tablet by mouth daily as needed for pain.    [provider]  LACTOBACILLUS PO Take 1 capsule by mouth daily.    [provider]  lamoTRIgine (LAMICTAL) 150 MG tablet Take 150 mg by mouth daily.  04/06/16   [provider]  memantine (NAMENDA) 10 MG tablet Take 10 mg by mouth 2 (two) times daily.      [provider]  metoprolol tartrate (LOPRESSOR) 25 MG tablet Take 1 tablet (25 mg total) by mouth daily. Please schedule an appt for further refills. 2nd attempt 12/22/19   Erlene Quan, PA-C  Multiple Vitamins-Minerals (MULTIVITAMIN ADULT PO) Take 1 tablet by mouth daily.    [provider]  OLANZapine (ZYPREXA) 15 MG tablet Take 15 mg by mouth 2 (two) times daily.     [provider]  Omega-3 Fatty Acids (FISH OIL) 1000 MG CAPS Take 1,000 mg by mouth daily.    [provider]  omeprazole (PRILOSEC) 40 MG capsule Take 40 mg by mouth daily.    [provider]  QUEtiapine (SEROQUEL) 25 MG tablet Take 25 mg by mouth 3 (three) times daily.     [provider]  ramelteon (ROZEREM) 8 MG tablet Take 8 mg by mouth at bedtime as needed for sleep.     [provider]  spironolactone (ALDACTONE) 25 MG tablet TAKE 1 TABLET BY MOUTH EVERY DAY 02/15/20   Kilroy, Luke K, PA-C  SYNTHROID 150 MCG tablet TAKE 1 TABLET (150 MCG TOTAL) BY MOUTH DAILY. Patient taking differently: Take 150 mcg by mouth daily before breakfast.  10/23/15   Darlin Coco, MD    Allergies    Lithium, Fluoxetine, Macrodantin, Mirabegron, Paroxetine hcl, Paxil [paroxetine], Prednisone, Prozac [fluoxetine hcl], Sertraline hcl, Solifenacin, Sulfa antibiotics, and Wellbutrin [bupropion hcl]  Review of Systems   Review of Systems All other systems negative except as documented in the HPI. All pertinent positives and negatives as reviewed in the HPI. Physical Exam Updated Vital Signs BP (!) 184/94 (BP Location: Left Arm)   Pulse 93   Temp 98.2 F (36.8 C) (Oral)   Resp 18   SpO2 98%   Physical Exam Vitals and nursing note reviewed.  Constitutional:      General: She is not in acute distress.    Appearance: She is well-developed.  HENT:  Head: Normocephalic and atraumatic.  Eyes:     Pupils: Pupils are equal, round, and reactive to light.  Cardiovascular:     Rate and Rhythm: Normal rate and regular rhythm.     Heart sounds: Normal heart sounds. No murmur. No friction rub. No gallop.   Pulmonary:     Effort: Pulmonary effort is normal. No respiratory distress.     Breath sounds: Normal breath sounds. No wheezing.  Abdominal:     General: Bowel sounds are normal. There is no distension.     Palpations: Abdomen is soft.     Tenderness: There is no abdominal tenderness.  Musculoskeletal:     Cervical back: Normal range of motion and neck supple.  Skin:    General: Skin is warm and dry.     Capillary Refill: Capillary refill takes less than 2 seconds.     Findings: No erythema or rash.  Neurological:     General: No focal deficit present.     Mental Status: She is alert and oriented to person, place, and time.     Sensory: No sensory deficit.     Motor: No weakness or abnormal muscle tone.     Coordination: Coordination normal.  Psychiatric:        Behavior: Behavior normal.     ED Results / Procedures / Treatments   Labs (all labs ordered are listed, but only abnormal results are displayed) Labs Reviewed  BASIC METABOLIC PANEL  - Abnormal; Notable for the following components:      Result Value   Glucose, Bld 123 (*)    All other components within normal limits  CBC WITH DIFFERENTIAL/PLATELET    EKG None  Radiology No results found.  Procedures Procedures (including critical care time)  Medications Ordered in ED Medications  metoprolol tartrate (LOPRESSOR) tablet 25 mg (25 mg Oral Given 02/22/20 2101)    ED Course  I have reviewed the triage vital signs and the nursing notes.  Pertinent labs & imaging results that were available during my care of the patient were reviewed by me and considered in my medical decision making (see chart for details).    MDM Rules/Calculators/A&P                      At this point the patient's vital signs have shown elevated blood pressure but otherwise has been stable.  The patient is not having any endorgan damage or other abnormalities noted on examination.  I feel that the patient will need to add an extra dose of her metoprolol instead of taking it daily to twice daily.  Patient is advised this plan and all questions were answered.  Did advise her to follow-up with her primary doctor soon as possible for recheck.  The patient's orthostatic vital signs did not show any significant abnormalities at this time. Final Clinical Impression(s) / ED Diagnoses Final diagnoses:  None    Rx / DC Orders ED Discharge Orders    None       Rebeca Allegra 02/22/20 2112    Isla Pence, MD 02/22/20 2237

## 2020-02-22 NOTE — ED Triage Notes (Signed)
Pt here from [place] for hypertension. At PT appointment today, blood pressure in 200s for multiple readings. Sts for a few days she feels like she might fall when she stands up, although denies dizziness or headache. No neuro deficits. Takes metoprolol 25 mg qd.

## 2020-02-23 ENCOUNTER — Other Ambulatory Visit: Payer: Self-pay | Admitting: Cardiology

## 2020-02-23 DIAGNOSIS — M25562 Pain in left knee: Secondary | ICD-10-CM | POA: Diagnosis not present

## 2020-02-23 DIAGNOSIS — M545 Low back pain: Secondary | ICD-10-CM | POA: Diagnosis not present

## 2020-02-23 DIAGNOSIS — R2681 Unsteadiness on feet: Secondary | ICD-10-CM | POA: Diagnosis not present

## 2020-02-23 DIAGNOSIS — M6281 Muscle weakness (generalized): Secondary | ICD-10-CM | POA: Diagnosis not present

## 2020-02-27 DIAGNOSIS — Z1159 Encounter for screening for other viral diseases: Secondary | ICD-10-CM | POA: Diagnosis not present

## 2020-02-27 DIAGNOSIS — Z20828 Contact with and (suspected) exposure to other viral communicable diseases: Secondary | ICD-10-CM | POA: Diagnosis not present

## 2020-02-28 DIAGNOSIS — R269 Unspecified abnormalities of gait and mobility: Secondary | ICD-10-CM | POA: Diagnosis not present

## 2020-02-28 DIAGNOSIS — I10 Essential (primary) hypertension: Secondary | ICD-10-CM | POA: Diagnosis not present

## 2020-02-29 DIAGNOSIS — R2681 Unsteadiness on feet: Secondary | ICD-10-CM | POA: Diagnosis not present

## 2020-02-29 DIAGNOSIS — M545 Low back pain: Secondary | ICD-10-CM | POA: Diagnosis not present

## 2020-02-29 DIAGNOSIS — M6281 Muscle weakness (generalized): Secondary | ICD-10-CM | POA: Diagnosis not present

## 2020-02-29 DIAGNOSIS — M25562 Pain in left knee: Secondary | ICD-10-CM | POA: Diagnosis not present

## 2020-03-01 DIAGNOSIS — M85852 Other specified disorders of bone density and structure, left thigh: Secondary | ICD-10-CM | POA: Diagnosis not present

## 2020-03-05 DIAGNOSIS — M25562 Pain in left knee: Secondary | ICD-10-CM | POA: Diagnosis not present

## 2020-03-05 DIAGNOSIS — M545 Low back pain: Secondary | ICD-10-CM | POA: Diagnosis not present

## 2020-03-05 DIAGNOSIS — M6281 Muscle weakness (generalized): Secondary | ICD-10-CM | POA: Diagnosis not present

## 2020-03-05 DIAGNOSIS — Z20828 Contact with and (suspected) exposure to other viral communicable diseases: Secondary | ICD-10-CM | POA: Diagnosis not present

## 2020-03-05 DIAGNOSIS — R2681 Unsteadiness on feet: Secondary | ICD-10-CM | POA: Diagnosis not present

## 2020-03-05 DIAGNOSIS — Z1159 Encounter for screening for other viral diseases: Secondary | ICD-10-CM | POA: Diagnosis not present

## 2020-03-12 ENCOUNTER — Other Ambulatory Visit: Payer: Self-pay | Admitting: Cardiovascular Disease

## 2020-03-12 ENCOUNTER — Other Ambulatory Visit: Payer: Self-pay | Admitting: Neurology

## 2020-03-12 DIAGNOSIS — Z20828 Contact with and (suspected) exposure to other viral communicable diseases: Secondary | ICD-10-CM | POA: Diagnosis not present

## 2020-03-12 DIAGNOSIS — Z1159 Encounter for screening for other viral diseases: Secondary | ICD-10-CM | POA: Diagnosis not present

## 2020-03-12 DIAGNOSIS — M25562 Pain in left knee: Secondary | ICD-10-CM | POA: Diagnosis not present

## 2020-03-12 DIAGNOSIS — M6281 Muscle weakness (generalized): Secondary | ICD-10-CM | POA: Diagnosis not present

## 2020-03-12 DIAGNOSIS — R2681 Unsteadiness on feet: Secondary | ICD-10-CM | POA: Diagnosis not present

## 2020-03-12 DIAGNOSIS — M545 Low back pain: Secondary | ICD-10-CM | POA: Diagnosis not present

## 2020-03-19 DIAGNOSIS — E782 Mixed hyperlipidemia: Secondary | ICD-10-CM | POA: Diagnosis not present

## 2020-03-19 DIAGNOSIS — E039 Hypothyroidism, unspecified: Secondary | ICD-10-CM | POA: Diagnosis not present

## 2020-03-19 DIAGNOSIS — Z20828 Contact with and (suspected) exposure to other viral communicable diseases: Secondary | ICD-10-CM | POA: Diagnosis not present

## 2020-03-19 DIAGNOSIS — G301 Alzheimer's disease with late onset: Secondary | ICD-10-CM | POA: Diagnosis not present

## 2020-03-19 DIAGNOSIS — I10 Essential (primary) hypertension: Secondary | ICD-10-CM | POA: Diagnosis not present

## 2020-03-19 DIAGNOSIS — F319 Bipolar disorder, unspecified: Secondary | ICD-10-CM | POA: Diagnosis not present

## 2020-03-19 DIAGNOSIS — Z1159 Encounter for screening for other viral diseases: Secondary | ICD-10-CM | POA: Diagnosis not present

## 2020-03-26 DIAGNOSIS — Z20828 Contact with and (suspected) exposure to other viral communicable diseases: Secondary | ICD-10-CM | POA: Diagnosis not present

## 2020-03-26 DIAGNOSIS — Z1159 Encounter for screening for other viral diseases: Secondary | ICD-10-CM | POA: Diagnosis not present

## 2020-04-02 DIAGNOSIS — Z1159 Encounter for screening for other viral diseases: Secondary | ICD-10-CM | POA: Diagnosis not present

## 2020-04-02 DIAGNOSIS — Z20828 Contact with and (suspected) exposure to other viral communicable diseases: Secondary | ICD-10-CM | POA: Diagnosis not present

## 2020-04-06 ENCOUNTER — Other Ambulatory Visit: Payer: Self-pay | Admitting: Cardiology

## 2020-04-09 DIAGNOSIS — Z20828 Contact with and (suspected) exposure to other viral communicable diseases: Secondary | ICD-10-CM | POA: Diagnosis not present

## 2020-04-09 DIAGNOSIS — Z1159 Encounter for screening for other viral diseases: Secondary | ICD-10-CM | POA: Diagnosis not present

## 2020-04-16 DIAGNOSIS — I10 Essential (primary) hypertension: Secondary | ICD-10-CM | POA: Diagnosis not present

## 2020-04-16 DIAGNOSIS — Z20828 Contact with and (suspected) exposure to other viral communicable diseases: Secondary | ICD-10-CM | POA: Diagnosis not present

## 2020-04-16 DIAGNOSIS — K760 Fatty (change of) liver, not elsewhere classified: Secondary | ICD-10-CM | POA: Diagnosis not present

## 2020-04-16 DIAGNOSIS — G301 Alzheimer's disease with late onset: Secondary | ICD-10-CM | POA: Diagnosis not present

## 2020-04-16 DIAGNOSIS — R269 Unspecified abnormalities of gait and mobility: Secondary | ICD-10-CM | POA: Diagnosis not present

## 2020-04-16 DIAGNOSIS — R7303 Prediabetes: Secondary | ICD-10-CM | POA: Diagnosis not present

## 2020-04-16 DIAGNOSIS — F319 Bipolar disorder, unspecified: Secondary | ICD-10-CM | POA: Diagnosis not present

## 2020-04-16 DIAGNOSIS — I700 Atherosclerosis of aorta: Secondary | ICD-10-CM | POA: Diagnosis not present

## 2020-04-16 DIAGNOSIS — F0280 Dementia in other diseases classified elsewhere without behavioral disturbance: Secondary | ICD-10-CM | POA: Diagnosis not present

## 2020-04-16 DIAGNOSIS — Z1159 Encounter for screening for other viral diseases: Secondary | ICD-10-CM | POA: Diagnosis not present

## 2020-04-16 DIAGNOSIS — Z79899 Other long term (current) drug therapy: Secondary | ICD-10-CM | POA: Diagnosis not present

## 2020-04-23 DIAGNOSIS — Z1159 Encounter for screening for other viral diseases: Secondary | ICD-10-CM | POA: Diagnosis not present

## 2020-04-23 DIAGNOSIS — Z20828 Contact with and (suspected) exposure to other viral communicable diseases: Secondary | ICD-10-CM | POA: Diagnosis not present

## 2020-04-25 ENCOUNTER — Other Ambulatory Visit: Payer: Self-pay | Admitting: Cardiology

## 2020-04-29 ENCOUNTER — Other Ambulatory Visit: Payer: Self-pay | Admitting: Cardiology

## 2020-04-30 DIAGNOSIS — Z1159 Encounter for screening for other viral diseases: Secondary | ICD-10-CM | POA: Diagnosis not present

## 2020-04-30 DIAGNOSIS — Z20828 Contact with and (suspected) exposure to other viral communicable diseases: Secondary | ICD-10-CM | POA: Diagnosis not present

## 2020-04-30 NOTE — Telephone Encounter (Signed)
Rx request sent to pharmacy.  

## 2020-05-02 DIAGNOSIS — I10 Essential (primary) hypertension: Secondary | ICD-10-CM | POA: Diagnosis not present

## 2020-05-02 DIAGNOSIS — F319 Bipolar disorder, unspecified: Secondary | ICD-10-CM | POA: Diagnosis not present

## 2020-05-02 DIAGNOSIS — E039 Hypothyroidism, unspecified: Secondary | ICD-10-CM | POA: Diagnosis not present

## 2020-05-02 DIAGNOSIS — E782 Mixed hyperlipidemia: Secondary | ICD-10-CM | POA: Diagnosis not present

## 2020-05-02 DIAGNOSIS — G301 Alzheimer's disease with late onset: Secondary | ICD-10-CM | POA: Diagnosis not present

## 2020-05-14 DIAGNOSIS — Z1159 Encounter for screening for other viral diseases: Secondary | ICD-10-CM | POA: Diagnosis not present

## 2020-05-14 DIAGNOSIS — Z20828 Contact with and (suspected) exposure to other viral communicable diseases: Secondary | ICD-10-CM | POA: Diagnosis not present

## 2020-05-15 ENCOUNTER — Other Ambulatory Visit: Payer: Self-pay | Admitting: Cardiovascular Disease

## 2020-05-17 ENCOUNTER — Other Ambulatory Visit: Payer: Self-pay | Admitting: Emergency Medicine

## 2020-05-17 MED ORDER — SPIRONOLACTONE 25 MG PO TABS: 25.0000 mg | ORAL_TABLET | Freq: Every day | ORAL | 0 refills | Status: DC

## 2020-05-17 NOTE — Telephone Encounter (Signed)
*  STAT* If patient is at the pharmacy, call can be transferred to refill team.   1. Which medications need to be refilled? (please list name of each medication and dose if known) spironolactone (ALDACTONE) 25 MG tablet  2. Which pharmacy/location (including street and city if local pharmacy) is medication to be sent to? [id number] - [place], [place] - [address] AT [place]   3. Do they need a 30 day or 90 day supply? [place]

## 2020-05-20 IMAGING — DX DG CHEST 2V
2 series · 2 of 2 positions shown · non-contrast
Comparison: 11/11/2013

CLINICAL DATA: Dry cough

EXAM:
CHEST - 2 VIEW

[chest pa]
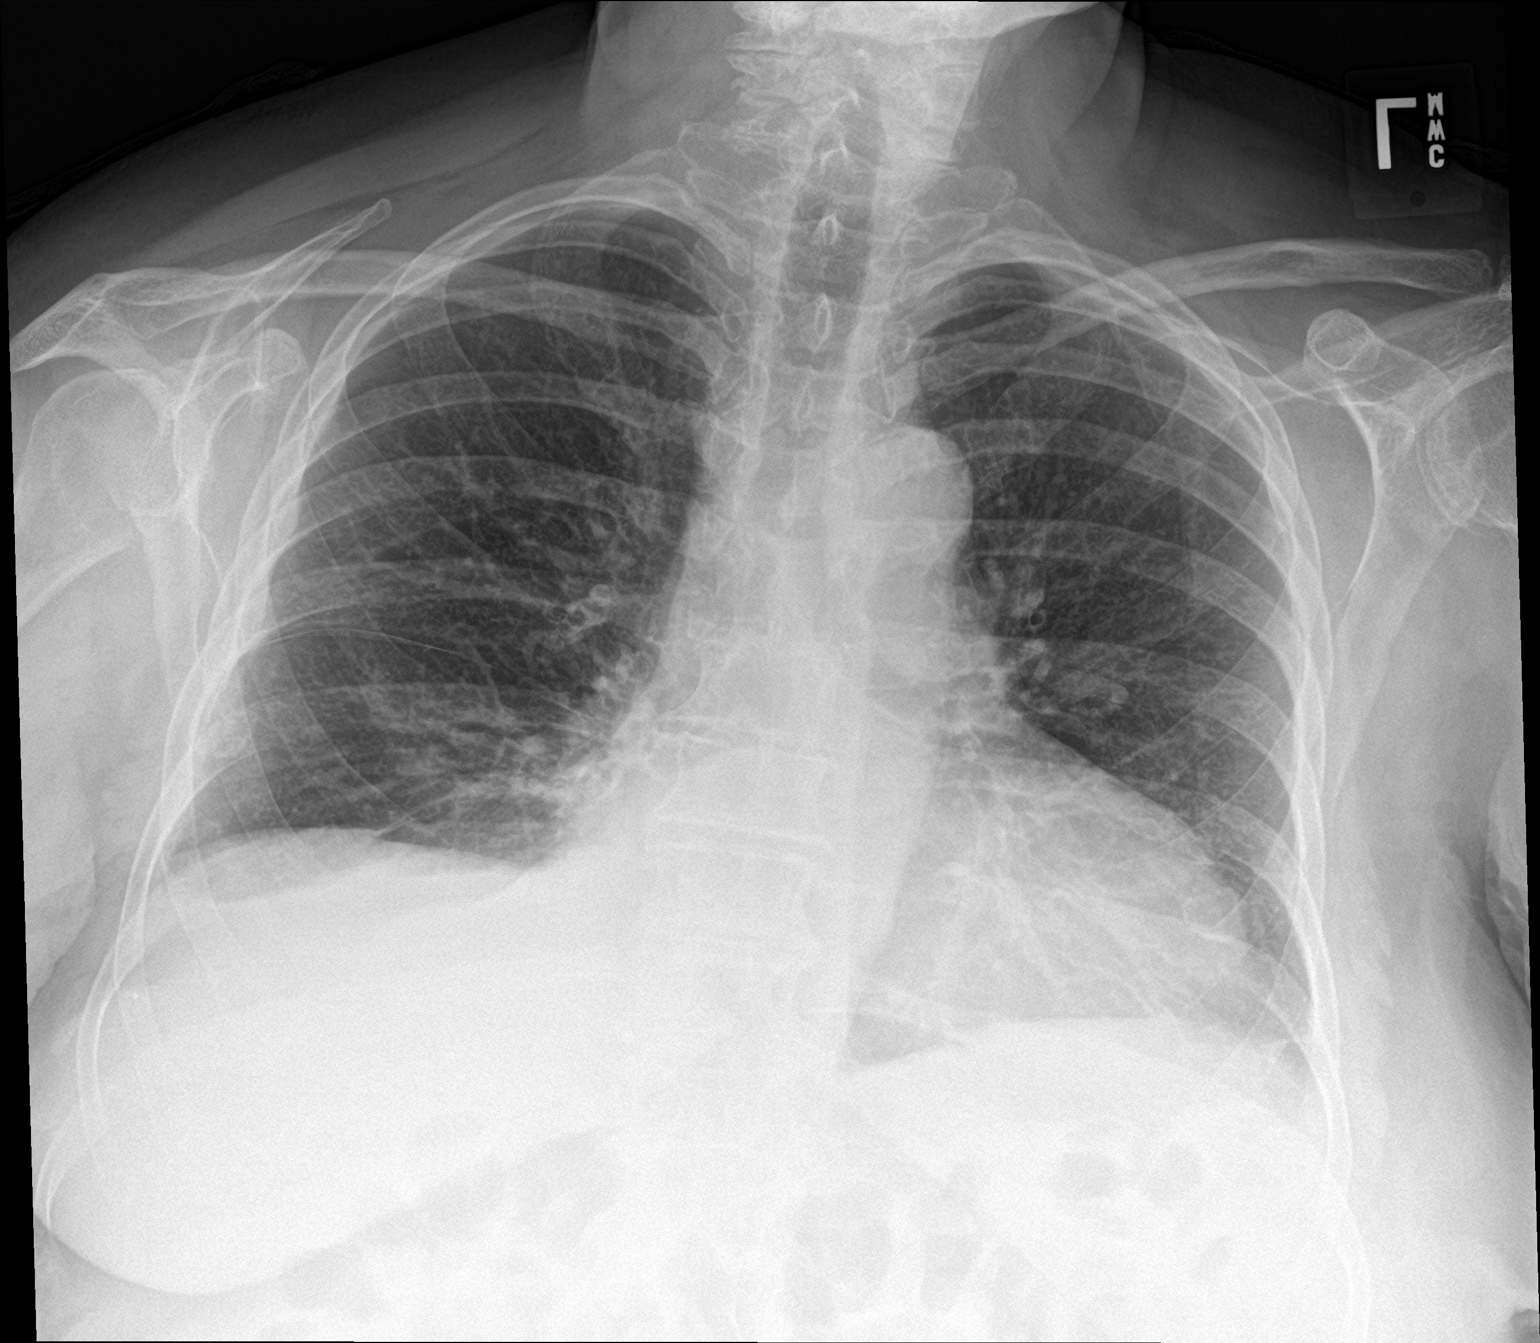

[chest lat]
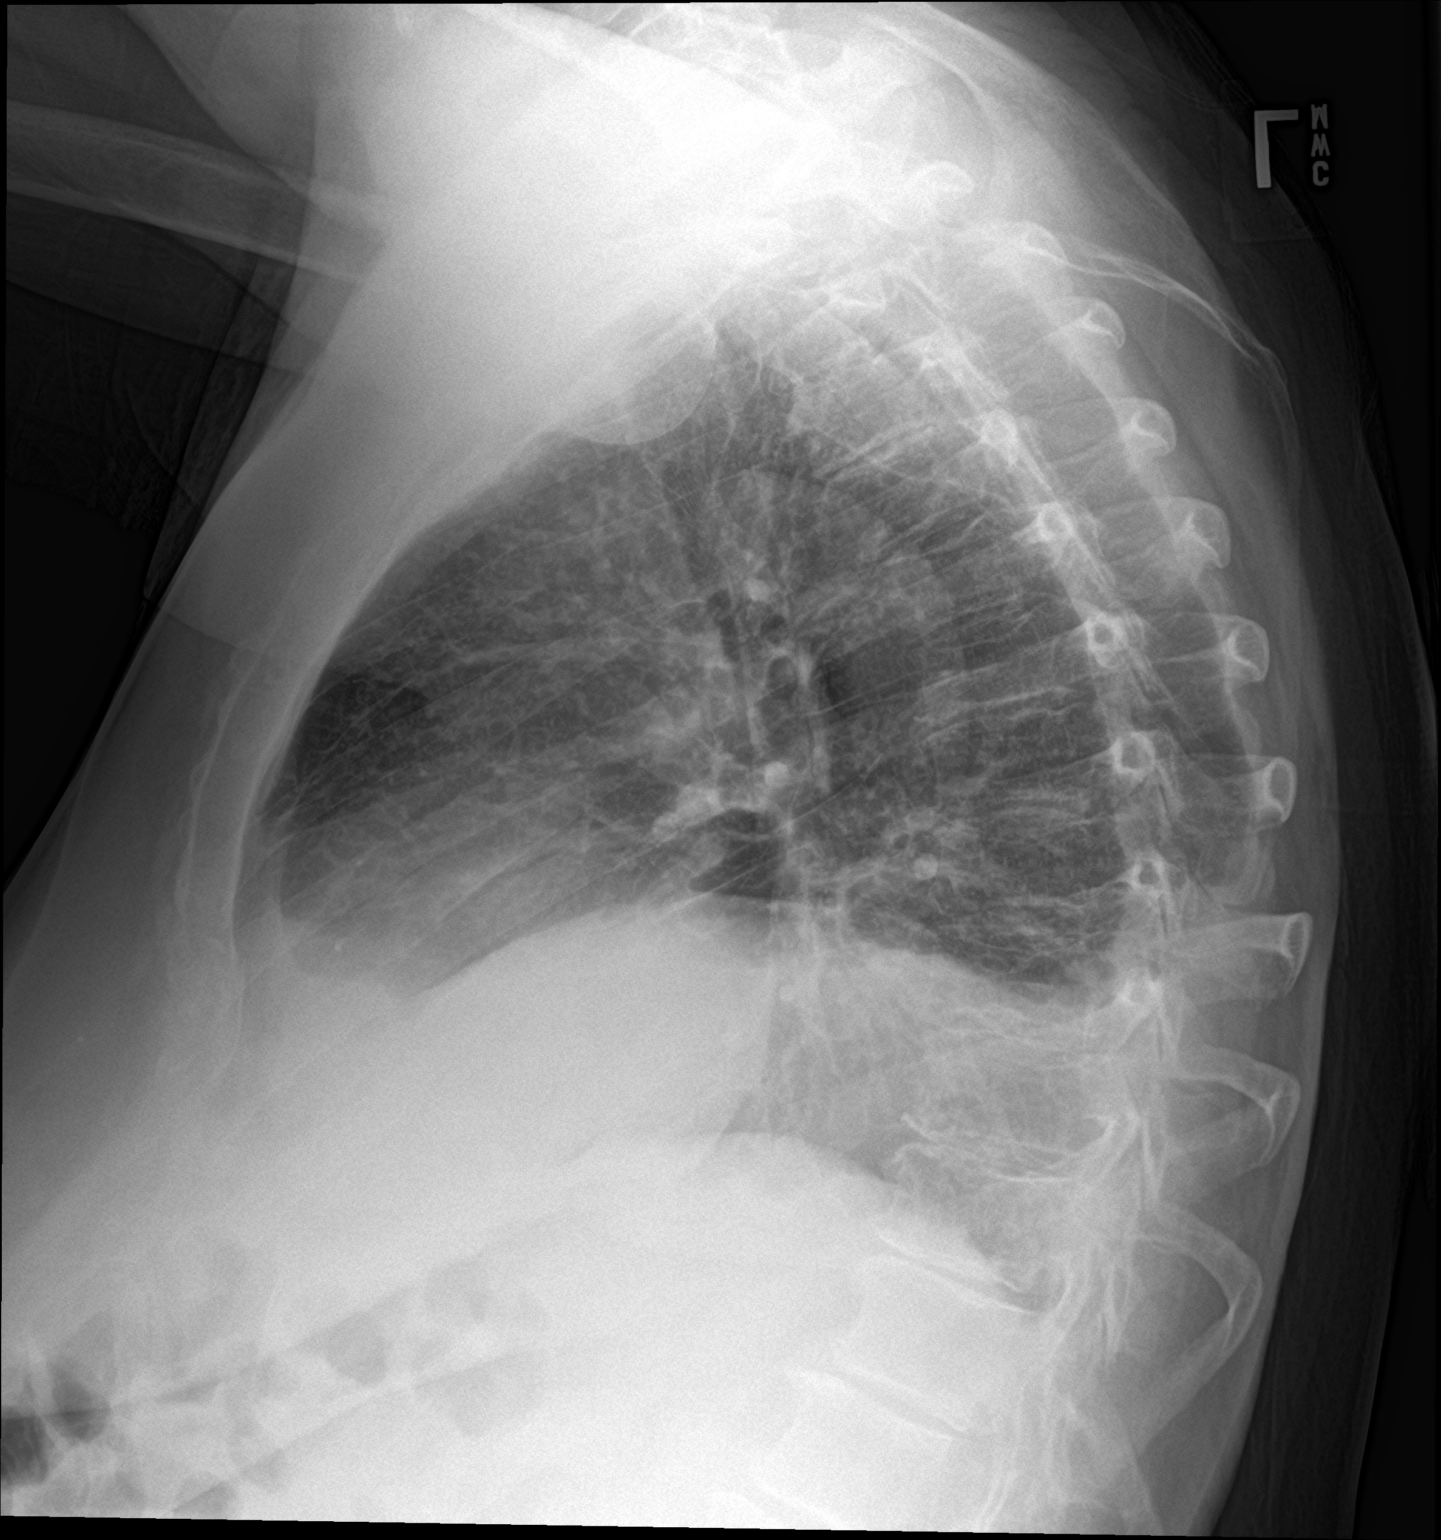

[2 of 2 positions shown; findings below may reference images not displayed]

FINDINGS: Low lung volumes with bibasilar atelectasis. Heart size is
borderline. No effusions or acute bony abnormality.
IMPRESSION: Bibasilar atelectasis.

## 2020-05-28 ENCOUNTER — Other Ambulatory Visit: Payer: Self-pay | Admitting: Cardiovascular Disease

## 2020-05-28 DIAGNOSIS — M179 Osteoarthritis of knee, unspecified: Secondary | ICD-10-CM | POA: Diagnosis not present

## 2020-05-28 DIAGNOSIS — M545 Low back pain: Secondary | ICD-10-CM | POA: Diagnosis not present

## 2020-05-28 DIAGNOSIS — M25562 Pain in left knee: Secondary | ICD-10-CM | POA: Diagnosis not present

## 2020-05-31 ENCOUNTER — Other Ambulatory Visit: Payer: Self-pay | Admitting: Cardiology

## 2020-05-31 DIAGNOSIS — Z85828 Personal history of other malignant neoplasm of skin: Secondary | ICD-10-CM | POA: Diagnosis not present

## 2020-05-31 DIAGNOSIS — L850 Acquired ichthyosis: Secondary | ICD-10-CM | POA: Diagnosis not present

## 2020-05-31 DIAGNOSIS — L821 Other seborrheic keratosis: Secondary | ICD-10-CM | POA: Diagnosis not present

## 2020-06-04 ENCOUNTER — Telehealth: Payer: Medicare HMO | Admitting: Cardiology

## 2020-06-04 DIAGNOSIS — Z20828 Contact with and (suspected) exposure to other viral communicable diseases: Secondary | ICD-10-CM | POA: Diagnosis not present

## 2020-06-04 DIAGNOSIS — M545 Low back pain: Secondary | ICD-10-CM | POA: Diagnosis not present

## 2020-06-04 DIAGNOSIS — Z1159 Encounter for screening for other viral diseases: Secondary | ICD-10-CM | POA: Diagnosis not present

## 2020-06-04 DIAGNOSIS — M5416 Radiculopathy, lumbar region: Secondary | ICD-10-CM | POA: Diagnosis not present

## 2020-06-04 DIAGNOSIS — M25562 Pain in left knee: Secondary | ICD-10-CM | POA: Diagnosis not present

## 2020-06-11 DIAGNOSIS — Z20828 Contact with and (suspected) exposure to other viral communicable diseases: Secondary | ICD-10-CM | POA: Diagnosis not present

## 2020-06-11 DIAGNOSIS — Z1159 Encounter for screening for other viral diseases: Secondary | ICD-10-CM | POA: Diagnosis not present

## 2020-06-12 ENCOUNTER — Telehealth (INDEPENDENT_AMBULATORY_CARE_PROVIDER_SITE_OTHER): Payer: Medicare HMO | Admitting: Cardiology

## 2020-06-12 ENCOUNTER — Encounter: Payer: Self-pay | Admitting: Cardiology

## 2020-06-12 VITALS — Ht 62.0 in

## 2020-06-12 DIAGNOSIS — I4510 Unspecified right bundle-branch block: Secondary | ICD-10-CM | POA: Diagnosis not present

## 2020-06-12 DIAGNOSIS — J90 Pleural effusion, not elsewhere classified: Secondary | ICD-10-CM

## 2020-06-12 DIAGNOSIS — I119 Hypertensive heart disease without heart failure: Secondary | ICD-10-CM | POA: Diagnosis not present

## 2020-06-12 DIAGNOSIS — E782 Mixed hyperlipidemia: Secondary | ICD-10-CM | POA: Diagnosis not present

## 2020-06-12 DIAGNOSIS — F319 Bipolar disorder, unspecified: Secondary | ICD-10-CM

## 2020-06-12 NOTE — Patient Instructions (Signed)
Medication Instructions:  Your physician recommends that you continue on your current medications as directed. Please refer to the Current Medication list given to you today.  *If you need a refill on your cardiac medications before your next appointment, please call your pharmacy*   Follow-Up: At [facility], you and your health needs are our priority.  As part of our continuing mission to provide you with exceptional heart care, we have created designated Provider Care Teams.  These Care Teams include your primary Cardiologist (physician) and Advanced Practice Providers (APPs -  Physician Assistants and Nurse Practitioners) who all work together to provide you with the care you need, when you need it.  We recommend signing up for the patient portal called "MyChart".  Sign up information is provided on this After Visit Summary.  MyChart is used to connect with patients for Virtual Visits (Telemedicine).  Patients are able to view lab/test results, encounter notes, upcoming appointments, etc.  Non-urgent messages can be sent to your provider as well.   To learn more about what you can do with MyChart, go to [email or web address].    Your next appointment:   12 month(s)  The format for your next appointment:   In Person  Provider:   You may see Tiffany Edmonson, MD or one of the following Advanced Practice Providers on your designated Care Team:    Luke Kilroy, PA-C  Callie Goodrich, PA-C  Jesse Cleaver, FNP    Other Instructions Please call our office 2 months in advance to schedule your follow-up appointment with Dr.  Creek. 

## 2020-06-12 NOTE — Progress Notes (Signed)
Virtual Visit via Telephone Note   This visit type was conducted due to national recommendations for restrictions regarding the COVID-19 Pandemic (e.g. social distancing) in an effort to limit this patient's exposure and mitigate transmission in our community.  Due to her co-morbid illnesses, this patient is at least at moderate risk for complications without adequate follow up.  This format is felt to be most appropriate for this patient at this time.  The patient did not have access to video technology/had technical difficulties with video requiring transitioning to audio format only (telephone).  All issues noted in this document were discussed and addressed.  No physical exam could be performed with this format.  Please refer to the patient's chart for her  consent to telehealth for [facility].   The patient was identified using 2 identifiers.  Date:  06/12/2020   ID:  [name], DOB 07-17-1941, MRN [id number]  Patient Location: Home Provider Location: Other:  [place]  PCP:  [name], MD  Cardiologist:  Skeet Latch, MD  Electrophysiologist:  None   Evaluation Performed:  Follow-Up Visit  Chief Complaint:  none  History of Present Illness:    [name] is a 79 y.o. female with a history of hypertension, hyperlipidemia,chronic shortness of breath,prior breast  Cancer, and schizo-affective disorder and bipolar disorder. She lives at [facility] [employer] [facility]. She has several friends there and is very happy. She was noted to have a murmur in the past and was referred for an echo July 2019 that revealed LVEF 65-70% with grade 1 diastolic dysfunction. She was noted to have a narrow LVOT but no outflow track gradient.    She had a laparoscopic Heller myotomy in 2019 at [facility]. 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 She has since followed up with Dr Margart Sickles. She had some recurrent effusion noted on CXR but was stable at follow up in Sept 2019.  In March 2020 she was admitted to [facility] with enteritis from Norovirus.  She was hypokalemic and required IV K+ supplementation.  She was COVID negative.  Since then she has done well, no unusual chest pain or SOB.  She was contacted by phone for routine follow up.    The patient does not have symptoms concerning for COVID-19 infection (fever, chills, cough, or new shortness of breath).  She has been vaccinated for COVID   Past Medical History:  Diagnosis Date  . Anxiety   . Arrhythmia    right bundle branch block  . Bipolar 1 disorder ([place])   . Cancer ([place])   . Hyperlipidemia   . Hypertension   . Morbid obesity ([place])   . Osteoarthritis   . Schizo-affective schizophrenia ([place])   . Thyroid disease    hypothyroidism   Past Surgical History:  Procedure Laterality Date  . BREAST SURGERY    . CHOLECYSTECTOMY    . DILATION AND CURETTAGE OF UTERUS    . ESOPHAGEAL MANOMETRY N/A 01/29/2018   Procedure: ESOPHAGEAL MANOMETRY (EM);  Surgeon: Wonda Horner, MD;  Location: [facility];  Service: Endoscopy;  Laterality: N/A;  . HAND SURGERY     Right-trigger finger  . IR THORACENTESIS ASP PLEURAL SPACE W/IMG GUIDE  06/28/2018  . IR THORACENTESIS ASP PLEURAL SPACE W/IMG GUIDE  07/19/2018  . KNEE SURGERY     Left  . TONSILLECTOMY       Current Meds  Medication Sig  . ALPRAZolam (XANAX) 0.5 MG tablet Take 1 mg by mouth  3 (three) times daily as needed for anxiety.   Marland Kitchen CALCIUM PO Take 2 tablets by mouth daily.   . cholecalciferol (VITAMIN D3) 25 MCG (1000 UT) tablet Take 1,000 Units by mouth daily. Wasn't sure if 1000 or 2000 units daily  . Cyanocobalamin (VITAMIN B-12 PO) Take 1 tablet by mouth daily.   . fenofibrate (TRICOR) 145 MG tablet TAKE 1 TABLET EVERY TUESDAY,THURSDAY,AND SATURDAY AT 6 PM  . gabapentin (NEURONTIN) 100 MG capsule TAKE 2 CAPSULES (200 MG TOTAL) BY MOUTH AT BEDTIME.  Marland Kitchen HYDROcodone-acetaminophen (NORCO/VICODIN) 5-325 MG tablet Take 1 tablet by mouth daily as needed for  moderate pain.   Marland Kitchen lamoTRIgine (LAMICTAL) 150 MG tablet Take 150 mg by mouth every evening.   . metoprolol tartrate (LOPRESSOR) 25 MG tablet Take 1 tablet (25 mg total) by mouth daily. Please schedule an appt for further refills. 2nd attempt (Patient taking differently: Take 25 mg by mouth 2 (two) times daily. )  . Multiple Vitamins-Minerals (MULTIVITAMIN ADULT PO) Take 1 tablet by mouth daily.  Marland Kitchen OLANZapine (ZYPREXA) 15 MG tablet Take 30 mg by mouth at bedtime.   . Omega-3 Fatty Acids (FISH OIL) 1000 MG CAPS Take 1,000 mg by mouth daily.  Marland Kitchen omeprazole (PRILOSEC) 40 MG capsule Take 40 mg by mouth daily.  . ondansetron (ZOFRAN) 4 MG tablet Take 4 mg by mouth daily as needed.  Marland Kitchen QUEtiapine (SEROQUEL) 25 MG tablet Take 50 mg by mouth at bedtime.   . ramelteon (ROZEREM) 8 MG tablet Take 8 mg by mouth at bedtime as needed for sleep.   Marland Kitchen spironolactone (ALDACTONE) 25 MG tablet TAKE 1 TABLET (25 MG TOTAL) BY MOUTH DAILY. KEEP OV.  . SYNTHROID 150 MCG tablet TAKE 1 TABLET (150 MCG TOTAL) BY MOUTH DAILY. (Patient taking differently: Take 150 mcg by mouth daily. )     Allergies:   Lithium, Fluoxetine, Macrodantin, Mirabegron, Paroxetine hcl, Paxil [paroxetine], Prednisone, Prozac [fluoxetine hcl], Sertraline hcl, Solifenacin, Sulfa antibiotics, and Wellbutrin [bupropion hcl]   Social History   Tobacco Use  . Smoking status: Former Smoker    Quit date: 12/08/1978    Years since quitting: 41.5  . Smokeless tobacco: Never Used  Substance Use Topics  . Alcohol use: Yes    Comment: 1 time a year  . Drug use: No     Family Hx: The patient's family history includes Hypertension in her mother.  ROS:   Please see the history of present illness.     All other systems reviewed and are negative.   Prior CV studies:   The following studies were reviewed today:  Echo July 2019- Study Conclusions   - Left ventricle: The cavity size was normal. There was mild  concentric hypertrophy. Systolic  function was hyperdynamic. The  estimated ejection fraction was in the range of 75% to 80%. There  was no dynamic obstruction. Wall motion was normal; there were no  regional wall motion abnormalities. Doppler parameters are  consistent with abnormal left ventricular relaxation (grade 1  diastolic dysfunction).  - Aortic valve: There was trivial regurgitation. Valve area (VTI):  2.85 cm^2. Valve area (Vmax): 2.45 cm^2. Valve area (Vmean): 2.73  cm^2.  - Mitral valve: Calcified annulus.   Labs/Other Tests and Data Reviewed:    EKG:  An ECG dated 02/13/2019 was personally reviewed today and demonstrated:  NSR, ST 104, RBBB, LVH with IVCD  Recent Labs: 02/22/2020: BUN 15; Creatinine, Ser 0.89; Hemoglobin 14.4; Platelets 223; Potassium 4.0; Sodium 144   Recent  Lipid Panel Lab Results  Component Value Date/Time   CHOL 187 10/10/2015 09:33 AM   TRIG 315 (H) 10/10/2015 09:33 AM   HDL 36 (L) 10/10/2015 09:33 AM   CHOLHDL 5.2 (H) 10/10/2015 09:33 AM   LDLCALC 88 10/10/2015 09:33 AM   LDLDIRECT 118.0 11/09/2014 08:53 AM    Wt Readings from Last 3 Encounters:  09/13/19 186 lb (84.4 kg)  02/13/19 178 lb 12.7 oz (81.1 kg)  07/19/18 172 lb 12.8 oz (78.4 kg)     Objective:    Vital Signs:  Ht 5\' 2"  (1.575 m)   BMI 34.02 kg/m    VITAL SIGNS:  reviewed  ASSESSMENT & PLAN:    Hypertensive heart disease without heart failure Echo 06/28/18- EF 75-80%, no LVOT gradient  Pleural effusion on right S/P thoracentesis 06/29/18, dx chylothorax  CAD- Coronary ca++ noted on CT scan July 2019  Dyslipidemia- On Tricor and omega -3. PCP follows  Bipolar disorder-  Stable on multiple medications  Plan- F/U one year with Dr Oval Linsey.  COVID-19 Education: The signs and symptoms of COVID-19 were discussed with the patient and how to seek care for testing (follow up with PCP or arrange E-visit).  The importance of social distancing was discussed today.  Time:   Today, I have  spent 15 minutes with the patient with telehealth technology discussing the above problems.     Medication Adjustments/Labs and Tests Ordered: Current medicines are reviewed at length with the patient today.  Concerns regarding medicines are outlined above.   Tests Ordered: No orders of the defined types were placed in this encounter.   Medication Changes: No orders of the defined types were placed in this encounter.   Follow Up:  In Person Dr Oval Linsey  Signed, Kerin Ransom, PA-C  06/12/2020 10:07 AM    [place]

## 2020-06-18 DIAGNOSIS — Z1159 Encounter for screening for other viral diseases: Secondary | ICD-10-CM | POA: Diagnosis not present

## 2020-06-18 DIAGNOSIS — Z20828 Contact with and (suspected) exposure to other viral communicable diseases: Secondary | ICD-10-CM | POA: Diagnosis not present

## 2020-06-21 DIAGNOSIS — I10 Essential (primary) hypertension: Secondary | ICD-10-CM | POA: Diagnosis not present

## 2020-06-25 ENCOUNTER — Other Ambulatory Visit: Payer: Self-pay | Admitting: Cardiology

## 2020-06-25 DIAGNOSIS — Z20828 Contact with and (suspected) exposure to other viral communicable diseases: Secondary | ICD-10-CM | POA: Diagnosis not present

## 2020-06-25 DIAGNOSIS — Z1159 Encounter for screening for other viral diseases: Secondary | ICD-10-CM | POA: Diagnosis not present

## 2020-06-30 ENCOUNTER — Other Ambulatory Visit: Payer: Self-pay | Admitting: Neurology

## 2020-07-02 DIAGNOSIS — Z20828 Contact with and (suspected) exposure to other viral communicable diseases: Secondary | ICD-10-CM | POA: Diagnosis not present

## 2020-07-02 DIAGNOSIS — Z1159 Encounter for screening for other viral diseases: Secondary | ICD-10-CM | POA: Diagnosis not present

## 2020-07-04 NOTE — Progress Notes (Signed)
NEUROLOGY FOLLOW UP OFFICE NOTE  [name] [id number]  HISTORY OF PRESENT ILLNESS: [name] is a 79 year old female with HTN, HLD, hypothyroidism, Bipolar 1 disorder, schizoaffective disorder, Alzheimer's disease and history of breast cancer who follows up for headaches.  UPDATE: She has not had anymore headaches.  She may have a mild headache once in awhile.   Current NSAIDS:Ibuprofen 400mg  twice daily with food Current analgesics:Tylenol, hydrocodone-acetaminophen Current triptans:none Current ergotamine:none Current anti-emetic:Zofran 4mg  Current muscle relaxants:none Current anti-anxiolytic:alprazolam Current sleep aide:ramelteon 8mg  Current Antihypertensive medications:Metoprolol tartrate 25mg  daily, spironolactone Current Antidepressant/antipsychoticmedications: Olanzapine 15mg  twice daily, Seroquel 25mg  daily Current Anticonvulsant medications: Gabapentin 200 mg at bedtime; lamotrigine 150mg  daily Current anti-CGRP:none Current Vitamins/Herbal/Supplements:D,Fish Oil, MVI Current Antihistamines/Decongestants:none Other therapy:none Hormone/birth control:none Other medications:Synthroid, memantine.  She stopped donepezil and nausea resolved.    HISTORY: Onset:Around April 2020. No preceding event. Location:Back of head, usually occurs at night, not during the day. Non-radiating. Quality:Non-throbbing Initial intensity:Mild to moderate. She deniesthunderclap headache Aura:none Premonitory Phase:none Postdrome:none Associated symptoms:Shedenies associated scalp paresthesias/allodynia, nausea, vomiting, photophobia, phonophobia, visual disturbance orunilateral numbness or weakness. Initial duration:Feels it as she goes to bed and falls asleep. If she wakes up throughout the night, she may feel it. Once she gets up in the morning, she feels okay. No headaches during the day.  Initial Frequency:Almost  every night Triggers:Laying down Relieving factors:Getting up. Activity:Does not aggravate She followed up with her orthopedist, Dr. Gladstone Lighter, who ordered a cervical X-ray and was told she had multilevel arthritis. She was prescribed ibuprofen.    Past imaging (personally reviewed): 04/27/2013 CT Head:Atrophy and mild chronic small vessel ischemic changes in white matter. No acute intracranial abnormality. 04/27/2013 CT Cervical Spine: Moderate to advanced cervical degenerative disease with spondylosis and facet hypertrophy as well as multilevel spinal stenosis most prominent at C5-6 and C6-7, as well as foraminal stenosis bilaterally most prominent at C3-4 and C4-5 and on right at C5-6. 07/05/2009 MRI Brain w/o: Chronic small vessel ischemic changes in hemispheric white matter and frontal and temporalatrophy. No acute intracranial abnormality. 10/11/2008 MRI Brain w/o:Frontal and temporal atrophy and chronic small vessel ischemic changes in hemispheric white matter. No acute intracranial abnormality.  Past NSAIDS:Ibuprofen800mg  twice daily (made her feel unsteady with slurred speech), naproxen 220mg  Past analgesics:none Past abortive triptans:none Past abortive ergotamine:none Past muscle relaxants:Tizanidine 4mg  at bedtime(ineffective) Past anti-emetic:none Past antihypertensive medications:none Past antidepressant medications:none Past anticonvulsant medications:none Past anti-CGRP:none Past vitamins/Herbal/Supplements:none Past antihistamines/decongestants:none Other past therapies:none  PAST MEDICAL HISTORY: Past Medical History:  Diagnosis Date  . Anxiety   . Arrhythmia    right bundle branch block  . Bipolar 1 disorder ([place])   . Cancer ([place])   . Hyperlipidemia   . Hypertension   . Morbid obesity ([place])   . Osteoarthritis   . Schizo-affective schizophrenia ([place])   . Thyroid disease    hypothyroidism    MEDICATIONS: Current  Outpatient Medications on File Prior to Visit  Medication Sig Dispense Refill  . ALPRAZolam (XANAX) 0.5 MG tablet Take 1 mg by mouth 3 (three) times daily as needed for anxiety.     Marland Kitchen CALCIUM PO Take 2 tablets by mouth daily.     . cholecalciferol (VITAMIN D3) 25 MCG (1000 UT) tablet Take 1,000 Units by mouth daily. Wasn't sure if 1000 or 2000 units daily    . Cyanocobalamin (VITAMIN B-12 PO) Take 1 tablet by mouth daily.     . fenofibrate (TRICOR) 145 MG tablet TAKE 1 TABLET EVERY TUESDAY,THURSDAY,AND SATURDAY AT 6  PM 48 tablet 1  . gabapentin (NEURONTIN) 100 MG capsule TAKE 2 CAPSULES (200 MG TOTAL) BY MOUTH AT BEDTIME. 60 capsule 0  . HYDROcodone-acetaminophen (NORCO/VICODIN) 5-325 MG tablet Take 1 tablet by mouth daily as needed for moderate pain.     Marland Kitchen lamoTRIgine (LAMICTAL) 150 MG tablet Take 150 mg by mouth every evening.   12  . metoprolol tartrate (LOPRESSOR) 25 MG tablet Take 1 tablet (25 mg total) by mouth daily. Please schedule an appt for further refills. 2nd attempt (Patient taking differently: Take 25 mg by mouth 2 (two) times daily. ) 15 tablet 0  . Multiple Vitamins-Minerals (MULTIVITAMIN ADULT PO) Take 1 tablet by mouth daily.    Marland Kitchen OLANZapine (ZYPREXA) 15 MG tablet Take 30 mg by mouth at bedtime.     . Omega-3 Fatty Acids (FISH OIL) 1000 MG CAPS Take 1,000 mg by mouth daily.    Marland Kitchen omeprazole (PRILOSEC) 40 MG capsule Take 40 mg by mouth daily.    . ondansetron (ZOFRAN) 4 MG tablet Take 4 mg by mouth daily as needed.    Marland Kitchen QUEtiapine (SEROQUEL) 25 MG tablet Take 50 mg by mouth at bedtime.     . ramelteon (ROZEREM) 8 MG tablet Take 8 mg by mouth at bedtime as needed for sleep.     Marland Kitchen spironolactone (ALDACTONE) 25 MG tablet Take 1 tablet (25 mg total) by mouth daily. 90 tablet 3  . SYNTHROID 150 MCG tablet TAKE 1 TABLET (150 MCG TOTAL) BY MOUTH DAILY. (Patient taking differently: Take 150 mcg by mouth daily. ) 30 tablet 6   No current facility-administered medications on file prior to  visit.    ALLERGIES: Allergies  Allergen Reactions  . Lithium Nausea Only  . Fluoxetine Other (See Comments)    Pt felt crazy  . Macrodantin Other (See Comments)    unknown  . Mirabegron Other (See Comments)    ineffective  . Paroxetine Hcl     Other reaction(s): Other (See Comments) Made pt feel crazy  . Paxil [Paroxetine] Other (See Comments)    Made pt feel crazy   . Prednisone     Makes her feel very jittery  . Prozac [Fluoxetine Hcl] Other (See Comments)    Pt felt crazy   . Sertraline Hcl   . Solifenacin Other (See Comments)    Ineffective   . Sulfa Antibiotics Other (See Comments)    unknown  . Wellbutrin [Bupropion Hcl] Other (See Comments)    unknown    FAMILY HISTORY: Family History  Problem Relation Age of Onset  . Hypertension Mother     SOCIAL HISTORY: Social History   Socioeconomic History  . Marital status: Divorced    Spouse name: Not on file  . Number of children: Not on file  . Years of education: Not on file  . Highest education level: Not on file  Occupational History  . Not on file  Tobacco Use  . Smoking status: Former Smoker    Quit date: 12/08/1978    Years since quitting: 41.6  . Smokeless tobacco: Never Used  Substance and Sexual Activity  . Alcohol use: Yes    Comment: 1 time a year  . Drug use: No  . Sexual activity: Not on file  Other Topics Concern  . Not on file  Social History Narrative   Lives in [place] (has been there 10 years) - states in independent living / has a Marine scientist helps her with meds      Degrees  in [place] Lit and nursing      No caffeine      Social Determinants of Health   Financial Resource Strain:   . Difficulty of Paying Living Expenses:   Food Insecurity:   . Worried About Charity fundraiser in the Last Year:   . Arboriculturist in the Last Year:   Transportation Needs:   . Film/video editor (Medical):   Marland Kitchen Lack of Transportation (Non-Medical):   Physical Activity:   . Days of  Exercise per Week:   . Minutes of Exercise per Session:   Stress:   . Feeling of Stress :   Social Connections:   . Frequency of Communication with Friends and Family:   . Frequency of Social Gatherings with Friends and Family:   . Attends Religious Services:   . Active Member of Clubs or Organizations:   . Attends Archivist Meetings:   Marland Kitchen Marital Status:   Intimate Partner Violence:   . Fear of Current or Ex-Partner:   . Emotionally Abused:   Marland Kitchen Physically Abused:   . Sexually Abused:     PHYSICAL EXAM: Blood pressure (!) 161/99, pulse 96, height 5\' 4"  (1.626 m), weight 159 lb 6.4 oz (72.3 kg), SpO2 92 %. General: No acute distress.  Patient appears well-groomed.   Head:  Normocephalic/atraumatic Eyes:  Fundi examined but not visualized Neck: supple, no paraspinal tenderness, full range of motion Heart:  Regular rate and rhythm Lungs:  Clear to auscultation bilaterally Back: No paraspinal tenderness Neurological Exam: alert and oriented to person, place, and time. Attention span and concentration intact, recent memory poor, remote memory intact, fund of knowledge intact.  Speech fluent and not dysarthric, language intact.  CN II-XII intact. Bulk and tone normal, muscle strength 5/5 throughout.  Sensation to light touch, temperature and vibration intact.  Deep tendon reflexes 2+ throughout, toes downgoing.  Finger to nose and heel to shin testing intact.  Gait normal, Romberg negative.  IMPRESSION: Cervicogenic headache Cervical spondylosis Elevated blood pressure  PLAN: Gabapentin 200mg  at bedtime Follow up with PCP regarding blood pressure Follow up in 9 months.  Metta Clines, DO  CC: [name], MD

## 2020-07-05 ENCOUNTER — Other Ambulatory Visit: Payer: Self-pay

## 2020-07-05 ENCOUNTER — Encounter: Payer: Self-pay | Admitting: Neurology

## 2020-07-05 ENCOUNTER — Ambulatory Visit (INDEPENDENT_AMBULATORY_CARE_PROVIDER_SITE_OTHER): Payer: Medicare HMO | Admitting: Neurology

## 2020-07-05 VITALS — BP 161/99 | HR 96 | Ht 64.0 in | Wt 159.4 lb

## 2020-07-05 DIAGNOSIS — R030 Elevated blood-pressure reading, without diagnosis of hypertension: Secondary | ICD-10-CM | POA: Diagnosis not present

## 2020-07-05 DIAGNOSIS — M47812 Spondylosis without myelopathy or radiculopathy, cervical region: Secondary | ICD-10-CM

## 2020-07-05 DIAGNOSIS — R519 Headache, unspecified: Secondary | ICD-10-CM | POA: Diagnosis not present

## 2020-07-05 DIAGNOSIS — G4486 Cervicogenic headache: Secondary | ICD-10-CM

## 2020-07-05 NOTE — Patient Instructions (Signed)
Continue gabapentin 200mg  at bedtime Follow up in 9 months

## 2020-07-09 DIAGNOSIS — Z20828 Contact with and (suspected) exposure to other viral communicable diseases: Secondary | ICD-10-CM | POA: Diagnosis not present

## 2020-07-09 DIAGNOSIS — Z1159 Encounter for screening for other viral diseases: Secondary | ICD-10-CM | POA: Diagnosis not present

## 2020-07-13 DIAGNOSIS — H353132 Nonexudative age-related macular degeneration, bilateral, intermediate dry stage: Secondary | ICD-10-CM | POA: Diagnosis not present

## 2020-07-13 DIAGNOSIS — Z961 Presence of intraocular lens: Secondary | ICD-10-CM | POA: Diagnosis not present

## 2020-07-13 DIAGNOSIS — H35373 Puckering of macula, bilateral: Secondary | ICD-10-CM | POA: Diagnosis not present

## 2020-07-13 DIAGNOSIS — H40013 Open angle with borderline findings, low risk, bilateral: Secondary | ICD-10-CM | POA: Diagnosis not present

## 2020-07-16 DIAGNOSIS — Z20828 Contact with and (suspected) exposure to other viral communicable diseases: Secondary | ICD-10-CM | POA: Diagnosis not present

## 2020-07-16 DIAGNOSIS — Z1159 Encounter for screening for other viral diseases: Secondary | ICD-10-CM | POA: Diagnosis not present

## 2020-07-23 DIAGNOSIS — Z20828 Contact with and (suspected) exposure to other viral communicable diseases: Secondary | ICD-10-CM | POA: Diagnosis not present

## 2020-07-23 DIAGNOSIS — Z1159 Encounter for screening for other viral diseases: Secondary | ICD-10-CM | POA: Diagnosis not present

## 2020-07-26 DIAGNOSIS — M8589 Other specified disorders of bone density and structure, multiple sites: Secondary | ICD-10-CM | POA: Diagnosis not present

## 2020-07-30 DIAGNOSIS — Z1159 Encounter for screening for other viral diseases: Secondary | ICD-10-CM | POA: Diagnosis not present

## 2020-07-30 DIAGNOSIS — Z20828 Contact with and (suspected) exposure to other viral communicable diseases: Secondary | ICD-10-CM | POA: Diagnosis not present

## 2020-08-01 DIAGNOSIS — M25562 Pain in left knee: Secondary | ICD-10-CM | POA: Diagnosis not present

## 2020-08-01 DIAGNOSIS — M545 Low back pain: Secondary | ICD-10-CM | POA: Diagnosis not present

## 2020-08-01 DIAGNOSIS — M25511 Pain in right shoulder: Secondary | ICD-10-CM | POA: Diagnosis not present

## 2020-08-01 DIAGNOSIS — M25512 Pain in left shoulder: Secondary | ICD-10-CM | POA: Diagnosis not present

## 2020-08-06 DIAGNOSIS — Z20828 Contact with and (suspected) exposure to other viral communicable diseases: Secondary | ICD-10-CM | POA: Diagnosis not present

## 2020-08-06 DIAGNOSIS — Z1159 Encounter for screening for other viral diseases: Secondary | ICD-10-CM | POA: Diagnosis not present

## 2020-08-10 ENCOUNTER — Other Ambulatory Visit: Payer: Self-pay | Admitting: Neurology

## 2020-08-13 DIAGNOSIS — Z20828 Contact with and (suspected) exposure to other viral communicable diseases: Secondary | ICD-10-CM | POA: Diagnosis not present

## 2020-08-13 DIAGNOSIS — Z1159 Encounter for screening for other viral diseases: Secondary | ICD-10-CM | POA: Diagnosis not present

## 2020-08-15 DIAGNOSIS — I10 Essential (primary) hypertension: Secondary | ICD-10-CM | POA: Diagnosis not present

## 2020-08-15 DIAGNOSIS — E782 Mixed hyperlipidemia: Secondary | ICD-10-CM | POA: Diagnosis not present

## 2020-08-15 DIAGNOSIS — E039 Hypothyroidism, unspecified: Secondary | ICD-10-CM | POA: Diagnosis not present

## 2020-08-15 DIAGNOSIS — G301 Alzheimer's disease with late onset: Secondary | ICD-10-CM | POA: Diagnosis not present

## 2020-08-15 DIAGNOSIS — F319 Bipolar disorder, unspecified: Secondary | ICD-10-CM | POA: Diagnosis not present

## 2020-08-17 ENCOUNTER — Encounter (HOSPITAL_COMMUNITY): Payer: Self-pay | Admitting: Internal Medicine

## 2020-08-17 ENCOUNTER — Emergency Department (HOSPITAL_COMMUNITY): Payer: Medicare HMO

## 2020-08-17 ENCOUNTER — Inpatient Hospital Stay (HOSPITAL_COMMUNITY)
Admission: EM | Admit: 2020-08-17 | Discharge: 2020-08-20 | DRG: 871 | Disposition: A | Discharge status: Home / Self Care | Payer: Medicare HMO | Attending: Internal Medicine | Admitting: Internal Medicine

## 2020-08-17 DIAGNOSIS — J189 Pneumonia, unspecified organism: Secondary | ICD-10-CM | POA: Diagnosis present

## 2020-08-17 DIAGNOSIS — R778 Other specified abnormalities of plasma proteins: Secondary | ICD-10-CM | POA: Diagnosis not present

## 2020-08-17 DIAGNOSIS — Z20822 Contact with and (suspected) exposure to covid-19: Secondary | ICD-10-CM | POA: Diagnosis present

## 2020-08-17 DIAGNOSIS — F319 Bipolar disorder, unspecified: Secondary | ICD-10-CM | POA: Diagnosis present

## 2020-08-17 DIAGNOSIS — F259 Schizoaffective disorder, unspecified: Secondary | ICD-10-CM | POA: Diagnosis present

## 2020-08-17 DIAGNOSIS — I517 Cardiomegaly: Secondary | ICD-10-CM | POA: Diagnosis not present

## 2020-08-17 DIAGNOSIS — G9340 Encephalopathy, unspecified: Secondary | ICD-10-CM | POA: Diagnosis not present

## 2020-08-17 DIAGNOSIS — A419 Sepsis, unspecified organism: Secondary | ICD-10-CM | POA: Diagnosis not present

## 2020-08-17 DIAGNOSIS — Z8249 Family history of ischemic heart disease and other diseases of the circulatory system: Secondary | ICD-10-CM | POA: Diagnosis not present

## 2020-08-17 DIAGNOSIS — Z882 Allergy status to sulfonamides status: Secondary | ICD-10-CM

## 2020-08-17 DIAGNOSIS — Z853 Personal history of malignant neoplasm of breast: Secondary | ICD-10-CM

## 2020-08-17 DIAGNOSIS — R7989 Other specified abnormal findings of blood chemistry: Secondary | ICD-10-CM | POA: Diagnosis present

## 2020-08-17 DIAGNOSIS — Z881 Allergy status to other antibiotic agents status: Secondary | ICD-10-CM | POA: Diagnosis not present

## 2020-08-17 DIAGNOSIS — I110 Hypertensive heart disease with heart failure: Secondary | ICD-10-CM | POA: Diagnosis not present

## 2020-08-17 DIAGNOSIS — J3489 Other specified disorders of nose and nasal sinuses: Secondary | ICD-10-CM | POA: Diagnosis not present

## 2020-08-17 DIAGNOSIS — I7090 Unspecified atherosclerosis: Secondary | ICD-10-CM | POA: Diagnosis not present

## 2020-08-17 DIAGNOSIS — I214 Non-ST elevation (NSTEMI) myocardial infarction: Secondary | ICD-10-CM

## 2020-08-17 DIAGNOSIS — Z87891 Personal history of nicotine dependence: Secondary | ICD-10-CM | POA: Diagnosis not present

## 2020-08-17 DIAGNOSIS — I270 Primary pulmonary hypertension: Secondary | ICD-10-CM | POA: Diagnosis not present

## 2020-08-17 DIAGNOSIS — F419 Anxiety disorder, unspecified: Secondary | ICD-10-CM | POA: Diagnosis present

## 2020-08-17 DIAGNOSIS — I21A1 Myocardial infarction type 2: Secondary | ICD-10-CM | POA: Diagnosis not present

## 2020-08-17 DIAGNOSIS — E785 Hyperlipidemia, unspecified: Secondary | ICD-10-CM | POA: Diagnosis present

## 2020-08-17 DIAGNOSIS — K219 Gastro-esophageal reflux disease without esophagitis: Secondary | ICD-10-CM | POA: Diagnosis present

## 2020-08-17 DIAGNOSIS — E039 Hypothyroidism, unspecified: Secondary | ICD-10-CM | POA: Diagnosis present

## 2020-08-17 DIAGNOSIS — R509 Fever, unspecified: Secondary | ICD-10-CM

## 2020-08-17 DIAGNOSIS — R269 Unspecified abnormalities of gait and mobility: Secondary | ICD-10-CM | POA: Diagnosis present

## 2020-08-17 DIAGNOSIS — I4510 Unspecified right bundle-branch block: Secondary | ICD-10-CM | POA: Diagnosis present

## 2020-08-17 DIAGNOSIS — I2510 Atherosclerotic heart disease of native coronary artery without angina pectoris: Secondary | ICD-10-CM

## 2020-08-17 DIAGNOSIS — I5032 Chronic diastolic (congestive) heart failure: Secondary | ICD-10-CM | POA: Diagnosis not present

## 2020-08-17 DIAGNOSIS — R0902 Hypoxemia: Secondary | ICD-10-CM | POA: Diagnosis not present

## 2020-08-17 DIAGNOSIS — I9589 Other hypotension: Secondary | ICD-10-CM | POA: Diagnosis not present

## 2020-08-17 DIAGNOSIS — G319 Degenerative disease of nervous system, unspecified: Secondary | ICD-10-CM | POA: Diagnosis not present

## 2020-08-17 DIAGNOSIS — I700 Atherosclerosis of aorta: Secondary | ICD-10-CM | POA: Diagnosis not present

## 2020-08-17 DIAGNOSIS — I10 Essential (primary) hypertension: Secondary | ICD-10-CM | POA: Diagnosis not present

## 2020-08-17 DIAGNOSIS — R748 Abnormal levels of other serum enzymes: Secondary | ICD-10-CM | POA: Diagnosis not present

## 2020-08-17 DIAGNOSIS — Z888 Allergy status to other drugs, medicaments and biological substances status: Secondary | ICD-10-CM | POA: Diagnosis not present

## 2020-08-17 DIAGNOSIS — G9341 Metabolic encephalopathy: Secondary | ICD-10-CM | POA: Diagnosis present

## 2020-08-17 DIAGNOSIS — R404 Transient alteration of awareness: Secondary | ICD-10-CM | POA: Diagnosis not present

## 2020-08-17 DIAGNOSIS — W19XXXA Unspecified fall, initial encounter: Secondary | ICD-10-CM | POA: Diagnosis not present

## 2020-08-17 DIAGNOSIS — S0990XA Unspecified injury of head, initial encounter: Secondary | ICD-10-CM | POA: Diagnosis not present

## 2020-08-17 LAB — I-STAT ARTERIAL BLOOD GAS, ED
Acid-Base Excess: 1 mmol/L (ref 0.0–2.0)
Bicarbonate: 25.9 mmol/L (ref 20.0–28.0)
Calcium, Ion: 1.36 mmol/L (ref 1.15–1.40)
HCT: 41 % (ref 36.0–46.0)
Hemoglobin: 13.9 g/dL (ref 12.0–15.0)
O2 Saturation: 96 %
Patient temperature: 98.6
Potassium: 3.5 mmol/L (ref 3.5–5.1)
Sodium: 142 mmol/L (ref 135–145)
TCO2: 27 mmol/L (ref 22–32)
pCO2 arterial: 41.1 mmHg (ref 32.0–48.0)
pH, Arterial: 7.408 (ref 7.350–7.450)
pO2, Arterial: 79 mmHg — ABNORMAL LOW (ref 83.0–108.0)

## 2020-08-17 LAB — SARS CORONAVIRUS 2 BY RT PCR (HOSPITAL ORDER, PERFORMED IN ~~LOC~~ HOSPITAL LAB): SARS Coronavirus 2: NEGATIVE

## 2020-08-17 LAB — PROCALCITONIN: Procalcitonin: <0.1 ng/mL

## 2020-08-17 LAB — CBC WITH DIFFERENTIAL/PLATELET
Abs Immature Granulocytes: 0.04 10*3/uL (ref 0.00–0.07)
Basophils Absolute: 0 10*3/uL (ref 0.0–0.1)
Basophils Relative: 0 %
Eosinophils Absolute: 0 10*3/uL (ref 0.0–0.5)
Eosinophils Relative: 0 %
HCT: 42.9 % (ref 36.0–46.0)
Hemoglobin: 14 g/dL (ref 12.0–15.0)
Immature Granulocytes: 0 %
Lymphocytes Relative: 7 %
Lymphs Abs: 0.9 10*3/uL (ref 0.7–4.0)
MCH: 31.5 pg (ref 26.0–34.0)
MCHC: 32.6 g/dL (ref 30.0–36.0)
MCV: 96.4 fL (ref 80.0–100.0)
Monocytes Absolute: 1.1 10*3/uL — ABNORMAL HIGH (ref 0.1–1.0)
Monocytes Relative: 8 %
Neutro Abs: 11.2 10*3/uL — ABNORMAL HIGH (ref 1.7–7.7)
Neutrophils Relative %: 85 %
Platelets: 224 10*3/uL (ref 150–400)
RBC: 4.45 MIL/uL (ref 3.87–5.11)
RDW: 13.3 % (ref 11.5–15.5)
WBC: 13.2 10*3/uL — ABNORMAL HIGH (ref 4.0–10.5)
nRBC: 0 % (ref 0.0–0.2)

## 2020-08-17 LAB — LACTATE DEHYDROGENASE: LDH: 207 U/L — ABNORMAL HIGH (ref 98–192)

## 2020-08-17 LAB — COMPREHENSIVE METABOLIC PANEL
ALT: 34 U/L (ref 0–44)
AST: 29 U/L (ref 15–41)
Albumin: 4 g/dL (ref 3.5–5.0)
Alkaline Phosphatase: 41 U/L (ref 38–126)
Anion gap: 13 (ref 5–15)
BUN: 15 mg/dL (ref 8–23)
CO2: 22 mmol/L (ref 22–32)
Calcium: 10.4 mg/dL — ABNORMAL HIGH (ref 8.9–10.3)
Chloride: 106 mmol/L (ref 98–111)
Creatinine, Ser: 1.1 mg/dL — ABNORMAL HIGH (ref 0.44–1.00)
GFR calc Af Amer: 55 mL/min — ABNORMAL LOW (ref >60)
GFR calc non Af Amer: 47 mL/min — ABNORMAL LOW (ref >60)
Glucose, Bld: 124 mg/dL — ABNORMAL HIGH (ref 70–99)
Potassium: 3.7 mmol/L (ref 3.5–5.1)
Sodium: 141 mmol/L (ref 135–145)
Total Bilirubin: 0.9 mg/dL (ref 0.3–1.2)
Total Protein: 7.2 g/dL (ref 6.5–8.1)

## 2020-08-17 LAB — RAPID URINE DRUG SCREEN, HOSP PERFORMED
Amphetamines: NOT DETECTED
Barbiturates: NOT DETECTED
Benzodiazepines: POSITIVE — AB
Cocaine: NOT DETECTED
Opiates: NOT DETECTED
Tetrahydrocannabinol: NOT DETECTED

## 2020-08-17 LAB — LACTIC ACID, PLASMA
Lactic Acid, Venous: 1.6 mmol/L (ref 0.5–1.9)
Lactic Acid, Venous: 1.1 mmol/L (ref 0.5–1.9)

## 2020-08-17 LAB — BRAIN NATRIURETIC PEPTIDE: B Natriuretic Peptide: 156.5 pg/mL — ABNORMAL HIGH (ref 0.0–100.0)

## 2020-08-17 LAB — TRIGLYCERIDES: Triglycerides: 138 mg/dL (ref <150)

## 2020-08-17 LAB — CBG MONITORING, ED: Glucose-Capillary: 132 mg/dL — ABNORMAL HIGH (ref 70–99)

## 2020-08-17 LAB — TROPONIN I (HIGH SENSITIVITY)
Troponin I (High Sensitivity): 340 ng/L (ref <18)
Troponin I (High Sensitivity): 1162 ng/L (ref <18)

## 2020-08-17 LAB — MAGNESIUM: Magnesium: 1.9 mg/dL (ref 1.7–2.4)

## 2020-08-17 LAB — FERRITIN: Ferritin: 97 ng/mL (ref 11–307)

## 2020-08-17 LAB — D-DIMER, QUANTITATIVE: D-Dimer, Quant: 0.62 ug/mL-FEU — ABNORMAL HIGH (ref 0.00–0.50)

## 2020-08-17 LAB — TSH: TSH: <0.01 u[IU]/mL — ABNORMAL LOW (ref 0.350–4.500)

## 2020-08-17 LAB — AMMONIA: Ammonia: 38 umol/L — ABNORMAL HIGH (ref 9–35)

## 2020-08-17 LAB — C-REACTIVE PROTEIN: CRP: 4.3 mg/dL — ABNORMAL HIGH (ref <1.0)

## 2020-08-17 LAB — FIBRINOGEN: Fibrinogen: 432 mg/dL (ref 210–475)

## 2020-08-17 LAB — CK: Total CK: 231 U/L (ref 38–234)

## 2020-08-17 LAB — PHOSPHORUS: Phosphorus: 2.5 mg/dL (ref 2.5–4.6)

## 2020-08-17 MED ORDER — ASPIRIN EC 81 MG PO TBEC
81.0000 mg | DELAYED_RELEASE_TABLET | Freq: Every day | ORAL | Status: DC
  Administered 2020-08-17 – 2020-08-20 (×3): 81 mg via ORAL
  Filled 2020-08-17 (×4): qty 1

## 2020-08-17 MED ORDER — ALPRAZOLAM 0.5 MG PO TABS
1.0000 mg | ORAL_TABLET | Freq: Three times a day (TID) | ORAL | Status: DC | PRN
  Administered 2020-08-17 – 2020-08-20 (×6): 1 mg via ORAL
  Filled 2020-08-17 (×2): qty 2
  Filled 2020-08-17: qty 4
  Filled 2020-08-17: qty 2
  Filled 2020-08-17: qty 4
  Filled 2020-08-17: qty 2

## 2020-08-17 MED ORDER — ACETAMINOPHEN 650 MG RE SUPP
650.0000 mg | Freq: Once | RECTAL | Status: AC
  Administered 2020-08-17: 650 mg via RECTAL
  Filled 2020-08-17: qty 1

## 2020-08-17 MED ORDER — LAMOTRIGINE 100 MG PO TABS
150.0000 mg | ORAL_TABLET | Freq: Every evening | ORAL | Status: DC
  Administered 2020-08-17 – 2020-08-19 (×3): 150 mg via ORAL
  Filled 2020-08-17 (×2): qty 1
  Filled 2020-08-17 (×2): qty 2

## 2020-08-17 MED ORDER — DONEPEZIL HCL 5 MG PO TABS
5.0000 mg | ORAL_TABLET | Freq: Every day | ORAL | Status: DC
  Administered 2020-08-17 – 2020-08-20 (×4): 5 mg via ORAL
  Filled 2020-08-17 (×5): qty 1

## 2020-08-17 MED ORDER — ADULT MULTIVITAMIN W/MINERALS CH
1.0000 | ORAL_TABLET | Freq: Every day | ORAL | Status: DC
  Administered 2020-08-18 – 2020-08-20 (×3): 1 via ORAL
  Filled 2020-08-17 (×4): qty 1

## 2020-08-17 MED ORDER — ENOXAPARIN SODIUM 40 MG/0.4ML ~~LOC~~ SOLN
40.0000 mg | SUBCUTANEOUS | Status: DC
  Administered 2020-08-17 – 2020-08-19 (×3): 40 mg via SUBCUTANEOUS
  Filled 2020-08-17 (×3): qty 0.4

## 2020-08-17 MED ORDER — ONDANSETRON HCL 4 MG/2ML IJ SOLN: 4.0000 mg | Freq: Four times a day (QID) | INTRAMUSCULAR | Status: DC | PRN

## 2020-08-17 MED ORDER — OMEGA-3-ACID ETHYL ESTERS 1 G PO CAPS
1.0000 g | ORAL_CAPSULE | Freq: Every day | ORAL | Status: DC
  Administered 2020-08-18 – 2020-08-20 (×3): 1 g via ORAL
  Filled 2020-08-17 (×5): qty 1

## 2020-08-17 MED ORDER — ACETAMINOPHEN 325 MG PO TABS: 650.0000 mg | ORAL_TABLET | Freq: Four times a day (QID) | ORAL | Status: DC | PRN

## 2020-08-17 MED ORDER — CALCIUM CARBONATE-VITAMIN D 500-200 MG-UNIT PO TABS
1.0000 | ORAL_TABLET | Freq: Every day | ORAL | Status: DC
  Administered 2020-08-18 – 2020-08-20 (×3): 1 via ORAL
  Filled 2020-08-17 (×4): qty 1

## 2020-08-17 MED ORDER — VITAMIN D 25 MCG (1000 UNIT) PO TABS
1000.0000 [IU] | ORAL_TABLET | Freq: Every day | ORAL | Status: DC
  Administered 2020-08-18 – 2020-08-20 (×3): 1000 [IU] via ORAL
  Filled 2020-08-17 (×3): qty 1

## 2020-08-17 MED ORDER — QUETIAPINE FUMARATE 50 MG PO TABS
50.0000 mg | ORAL_TABLET | Freq: Every day | ORAL | Status: DC
  Administered 2020-08-17 – 2020-08-19 (×3): 50 mg via ORAL
  Filled 2020-08-17 (×4): qty 1

## 2020-08-17 MED ORDER — SPIRONOLACTONE 25 MG PO TABS
25.0000 mg | ORAL_TABLET | Freq: Every day | ORAL | Status: DC
  Administered 2020-08-17 – 2020-08-20 (×4): 25 mg via ORAL
  Filled 2020-08-17 (×5): qty 1

## 2020-08-17 MED ORDER — LEVOTHYROXINE SODIUM 75 MCG PO TABS
150.0000 ug | ORAL_TABLET | Freq: Every day | ORAL | Status: DC
  Administered 2020-08-18 – 2020-08-20 (×3): 150 ug via ORAL
  Filled 2020-08-17 (×3): qty 2

## 2020-08-17 MED ORDER — METOPROLOL TARTRATE 25 MG PO TABS
25.0000 mg | ORAL_TABLET | Freq: Two times a day (BID) | ORAL | Status: DC
  Administered 2020-08-17 – 2020-08-20 (×6): 25 mg via ORAL
  Filled 2020-08-17 (×6): qty 1

## 2020-08-17 MED ORDER — ACETAMINOPHEN 650 MG RE SUPP: 650.0000 mg | Freq: Four times a day (QID) | RECTAL | Status: DC | PRN

## 2020-08-17 MED ORDER — RAMELTEON 8 MG PO TABS
8.0000 mg | ORAL_TABLET | Freq: Every evening | ORAL | Status: DC | PRN
  Administered 2020-08-17: 8 mg via ORAL
  Filled 2020-08-17: qty 1

## 2020-08-17 MED ORDER — HYDROCODONE-ACETAMINOPHEN 5-325 MG PO TABS: 1.0000 | ORAL_TABLET | Freq: Every day | ORAL | Status: DC | PRN

## 2020-08-17 MED ORDER — SODIUM CHLORIDE 0.9 % IV BOLUS
500.0000 mL | Freq: Once | INTRAVENOUS | Status: AC
  Administered 2020-08-17: 500 mL via INTRAVENOUS

## 2020-08-17 MED ORDER — VITAMIN B-12 100 MCG PO TABS
100.0000 ug | ORAL_TABLET | Freq: Every day | ORAL | Status: DC
  Administered 2020-08-18 – 2020-08-20 (×3): 100 ug via ORAL
  Filled 2020-08-17 (×4): qty 1

## 2020-08-17 MED ORDER — OLANZAPINE 10 MG PO TABS
30.0000 mg | ORAL_TABLET | Freq: Every day | ORAL | Status: DC
  Administered 2020-08-17 – 2020-08-19 (×3): 30 mg via ORAL
  Filled 2020-08-17: qty 6
  Filled 2020-08-17 (×4): qty 3

## 2020-08-17 MED ORDER — IOHEXOL 350 MG/ML SOLN
75.0000 mL | Freq: Once | INTRAVENOUS | Status: AC | PRN
  Administered 2020-08-17: 75 mL via INTRAVENOUS

## 2020-08-17 MED ORDER — GABAPENTIN 100 MG PO CAPS
200.0000 mg | ORAL_CAPSULE | Freq: Every day | ORAL | Status: DC
  Administered 2020-08-17 – 2020-08-19 (×3): 200 mg via ORAL
  Filled 2020-08-17 (×3): qty 2

## 2020-08-17 MED ORDER — SODIUM CHLORIDE 0.9 % IV SOLN
1000.0000 mL | INTRAVENOUS | Status: DC
  Administered 2020-08-17 – 2020-08-18 (×2): 1000 mL via INTRAVENOUS

## 2020-08-17 MED ORDER — PANTOPRAZOLE SODIUM 40 MG PO TBEC
40.0000 mg | DELAYED_RELEASE_TABLET | Freq: Every day | ORAL | Status: DC
  Administered 2020-08-17 – 2020-08-20 (×4): 40 mg via ORAL
  Filled 2020-08-17 (×4): qty 1

## 2020-08-17 MED ORDER — ONDANSETRON HCL 4 MG PO TABS: 4.0000 mg | ORAL_TABLET | Freq: Four times a day (QID) | ORAL | Status: DC | PRN

## 2020-08-17 NOTE — ED Provider Notes (Signed)
[facility] EMERGENCY DEPARTMENT Provider Note   CSN: [id number] Arrival date & time: 08/17/20  1052     History Chief Complaint  Patient presents with  . Fall  . Fever    [name] is a 79 y.o. female.  HPI Patient is brought by EMS from [facility] assisted living.  The patient reports that she felt fine yesterday.  She does not think that she has been ill.  She reports she got up in the night to go to the bathroom and fell.  She reports that she hit her left knee but does not think she had any other injury.  This morning, patient was noted to be very lethargic and febrile.  EMS reports temp of 102 on their arrival.  CBG within normal limits.  Room air oxygen saturation was at 85%.  She was placed on 3 L which took her to 93%.  Patient was able answer questions but could not recall the date.  I did discuss patient's history with her listed emergency contact.  She reports the patient does sometimes adjust her evening medications that she is having trouble sleeping.  She reports she might take an extra Xanax or an extra Seroquel.    Past Medical History:  Diagnosis Date  . Anxiety   . Arrhythmia    right bundle branch block  . Bipolar 1 disorder ([place])   . Cancer ([place])   . Hyperlipidemia   . Hypertension   . Morbid obesity ([place])   . Osteoarthritis   . Schizo-affective schizophrenia ([place])   . Thyroid disease    hypothyroidism    Patient Active Problem List   Diagnosis Date Noted  . Sepsis ([place]) 02/13/2019  . HLD (hyperlipidemia) 02/13/2019  . Anxiety 02/13/2019  . Chronic diastolic CHF (congestive heart failure) ([place]) 02/13/2019  . GERD (gastroesophageal reflux disease) 02/13/2019  . Nausea vomiting and diarrhea 02/13/2019  . Schizo-affective schizophrenia ([place])   . Bipolar 1 disorder ([place])   . Pleural effusion on right 06/28/2018  . Osteoarthritis of left knee 11/09/2014  . Nocturia more than twice per night 11/09/2014  . Right bundle branch  block 02/23/2013  . Irritable bowel syndrome with constipation and diarrhea 10/28/2012  . Breast cancer ([place]) 04/07/2012  . Dermatitis 12/24/2011  . Hypercholesterolemia 04/30/2011  . Hypothyroidism 04/30/2011  . Dementia ([place]) 04/30/2011  . Benign hypertensive heart disease without heart failure 04/30/2011    Past Surgical History:  Procedure Laterality Date  . BREAST SURGERY    . CHOLECYSTECTOMY    . DILATION AND CURETTAGE OF UTERUS    . ESOPHAGEAL MANOMETRY N/A 01/29/2018   Procedure: ESOPHAGEAL MANOMETRY (EM);  Surgeon: Wonda Horner, MD;  Location: [facility] ENDOSCOPY;  Service: Endoscopy;  Laterality: N/A;  . HAND SURGERY     Right-trigger finger  . IR THORACENTESIS ASP PLEURAL SPACE W/IMG GUIDE  06/28/2018  . IR THORACENTESIS ASP PLEURAL SPACE W/IMG GUIDE  07/19/2018  . KNEE SURGERY     Left  . TONSILLECTOMY       OB History   No obstetric history on file.     Family History  Problem Relation Age of Onset  . Hypertension Mother     Social History   Tobacco Use  . Smoking status: Former Smoker    Quit date: 12/08/1978    Years since quitting: 41.7  . Smokeless tobacco: Never Used  Substance Use Topics  . Alcohol use: Yes    Comment: 1 time a year  .  Drug use: No    Home Medications Prior to Admission medications   Medication Sig Start Date End Date Taking? Authorizing Provider  ALPRAZolam Duanne Moron) 0.5 MG tablet Take 1 mg by mouth 3 (three) times daily as needed for anxiety.    Yes [provider]  CALCIUM PO Take 2 tablets by mouth daily.    Yes [provider]  cholecalciferol (VITAMIN D3) 25 MCG (1000 UT) tablet Take 1,000 Units by mouth daily. Wasn't sure if 1000 or 2000 units daily   Yes [provider]  Cyanocobalamin (VITAMIN B-12 PO) Take 1 tablet by mouth daily.    Yes [provider]  donepezil (ARICEPT) 5 MG tablet Take 5 mg by mouth daily.   Yes [provider]  fenofibrate (TRICOR) 145 MG tablet TAKE 1 TABLET  EVERY TUESDAY,THURSDAY,AND SATURDAY AT 6 PM Patient taking differently: Take 145 mg by mouth 3 (three) times a week. TAKE 1 TABLET EVERY TUESDAY,THURSDAY,AND SATURDAY AT 6 PM 05/28/20  Yes Skeet Latch, MD  gabapentin (NEURONTIN) 100 MG capsule TAKE 2 CAPSULES (200 MG TOTAL) BY MOUTH AT BEDTIME. 08/10/20  Yes Jaffe, Adam R, DO  HYDROcodone-acetaminophen (NORCO/VICODIN) 5-325 MG tablet Take 1 tablet by mouth daily as needed for moderate pain.    Yes [provider]  lamoTRIgine (LAMICTAL) 150 MG tablet Take 150 mg by mouth every evening.  04/06/16  Yes [provider]  metoprolol tartrate (LOPRESSOR) 25 MG tablet Take 1 tablet (25 mg total) by mouth daily. Please schedule an appt for further refills. 2nd attempt Patient taking differently: Take 25 mg by mouth 2 (two) times daily.  12/22/19  Yes Kilroy, Luke K, PA-C  Multiple Vitamins-Minerals (MULTIVITAMIN ADULT PO) Take 1 tablet by mouth daily.   Yes [provider]  OLANZapine (ZYPREXA) 15 MG tablet Take 30 mg by mouth at bedtime.    Yes [provider]  Omega-3 Fatty Acids (FISH OIL) 1000 MG CAPS Take 1,000 mg by mouth daily.   Yes [provider]  omeprazole (PRILOSEC) 40 MG capsule Take 40 mg by mouth daily.   Yes [provider]  ondansetron (ZOFRAN) 4 MG tablet Take 4 mg by mouth daily as needed for nausea or vomiting.  01/11/20  Yes [provider]  QUEtiapine (SEROQUEL) 25 MG tablet Take 50 mg by mouth at bedtime.    Yes [provider]  ramelteon (ROZEREM) 8 MG tablet Take 8 mg by mouth at bedtime as needed for sleep.    Yes [provider]  spironolactone (ALDACTONE) 25 MG tablet Take 1 tablet (25 mg total) by mouth daily. 06/26/20  Yes Kilroy, Luke K, PA-C  SYNTHROID 150 MCG tablet TAKE 1 TABLET (150 MCG TOTAL) BY MOUTH DAILY. Patient taking differently: Take 150 mcg by mouth daily.  10/23/15  Yes Darlin Coco, MD  amLODipine (NORVASC) 2.5 MG tablet Take  2.5 mg by mouth at bedtime. Patient not taking: Reported on 08/17/2020 06/30/20   [provider]    Allergies    Lithium, Fluoxetine, Macrodantin, Mirabegron, Paroxetine hcl, Paxil [paroxetine], Prednisone, Prozac [fluoxetine hcl], Sertraline hcl, Solifenacin, Sulfa antibiotics, and Wellbutrin [bupropion hcl]  Review of Systems   Review of Systems 10 systems reviewed and negative except as per HPI Physical Exam Updated Vital Signs BP (!) 130/55   Pulse 89   Temp 99 F (37.2 C) (Rectal)   Resp (!) 24   SpO2 98%   Physical Exam Constitutional:      Comments: Patient answering questions. Mild  increased WOB. Slightly somnolent  HENT:     Head: Normocephalic and atraumatic.     Mouth/Throat:     Mouth: Mucous membranes are dry.  Eyes:     Extraocular Movements: Extraocular movements intact.     Conjunctiva/sclera: Conjunctivae normal.     Pupils: Pupils are equal, round, and reactive to light.  Cardiovascular:     Rate and Rhythm: Tachycardia present.     Pulses: Normal pulses.     Heart sounds: Normal heart sounds.  Pulmonary:     Breath sounds: Normal breath sounds.     Comments: Mild tachypnea Abdominal:     General: There is no distension.     Palpations: Abdomen is soft.     Tenderness: There is no abdominal tenderness. There is no guarding.  Musculoskeletal:        General: No swelling, tenderness or signs of injury. Normal range of motion.     Cervical back: Neck supple.     Right lower leg: No edema.     Left lower leg: No edema.  Skin:    General: Skin is warm and dry.  Neurological:     General: No focal deficit present.     Mental Status: She is oriented to person, place, and time.     Cranial Nerves: No cranial nerve deficit.     Motor: No weakness.     Coordination: Coordination normal.     Comments: At initial presentation, patient had appearance of delirium.  Speech somewhat slurred but mouth is very dry.  Content however was normal.  Patient  clearly understood my conversation and responded with appropriate answers and also exhibited long-term recall.  She seemed very mildly tremulous but was not hyperreflexic.  Followed commands for elevating lower extremities independently off the bed and holding.  Follow commands for performing grip strength.  The ocular motions were intact without obvious nystagmus.  Pupils symmetric and midrange.  Could not elicit the patellar reflexes.  No clonus and toes downgoing.  Psychiatric:        Mood and Affect: Mood normal.     ED Results / Procedures / Treatments   Labs (all labs ordered are listed, but only abnormal results are displayed) Labs Reviewed  CBC WITH DIFFERENTIAL/PLATELET - Abnormal; Notable for the following components:      Result Value   WBC 13.2 (*)    Neutro Abs 11.2 (*)    Monocytes Absolute 1.1 (*)    All other components within normal limits  COMPREHENSIVE METABOLIC PANEL - Abnormal; Notable for the following components:   Glucose, Bld 124 (*)    Creatinine, Ser 1.10 (*)    Calcium 10.4 (*)    GFR calc non Af Amer 47 (*)    GFR calc Af Amer 55 (*)    All other components within normal limits  D-DIMER, QUANTITATIVE (NOT AT [facility]) - Abnormal; Notable for the following components:   D-Dimer, Quant 0.62 (*)    All other components within normal limits  LACTATE DEHYDROGENASE - Abnormal; Notable for the following components:   LDH 207 (*)    All other components within normal limits  C-REACTIVE PROTEIN - Abnormal; Notable for the following components:   CRP 4.3 (*)    All other components within normal limits  BRAIN NATRIURETIC PEPTIDE - Abnormal; Notable for the following components:   B Natriuretic Peptide 156.5 (*)    All other components within normal limits  RAPID URINE DRUG SCREEN, HOSP PERFORMED - Abnormal;  Notable for the following components:   Benzodiazepines POSITIVE (*)    All other components within normal limits  AMMONIA - Abnormal; Notable for the  following components:   Ammonia 38 (*)    All other components within normal limits  CBG MONITORING, ED - Abnormal; Notable for the following components:   Glucose-Capillary 132 (*)    All other components within normal limits  I-STAT ARTERIAL BLOOD GAS, ED - Abnormal; Notable for the following components:   pO2, Arterial 79 (*)    All other components within normal limits  TROPONIN I (HIGH SENSITIVITY) - Abnormal; Notable for the following components:   Troponin I (High Sensitivity) 340 (*)    All other components within normal limits  TROPONIN I (HIGH SENSITIVITY) - Abnormal; Notable for the following components:   Troponin I (High Sensitivity) 1,162 (*)    All other components within normal limits  SARS CORONAVIRUS 2 BY RT PCR (HOSPITAL ORDER, [place] [facility])  CULTURE, BLOOD (ROUTINE X 2)  CULTURE, BLOOD (ROUTINE X 2)  LACTIC ACID, PLASMA  LACTIC ACID, PLASMA  PROCALCITONIN  FERRITIN  FIBRINOGEN  MAGNESIUM  PHOSPHORUS  TRIGLYCERIDES  CK  BLOOD GAS, ARTERIAL  URINALYSIS, ROUTINE W REFLEX MICROSCOPIC    EKG EKG Interpretation  Date/Time:  Friday August 17 2020 11:01:07 EDT Ventricular Rate:  109 PR Interval:    QRS Duration: 134 QT Interval:  383 QTC Calculation: 516 R Axis:   12 Text Interpretation: Sinus tachycardia Right bundle branch block old RBBB, no sig change from previous Confirmed by Charlesetta Shanks [phone]) on 08/17/2020 11:04:38 AM   Radiology CT Head Wo Contrast  Result Date: 08/17/2020 CLINICAL DATA:  Head trauma.  Unwitnessed fall. EXAM: CT HEAD WITHOUT CONTRAST TECHNIQUE: Contiguous axial images were obtained from the base of the skull through the vertex without intravenous contrast. COMPARISON:  Head CT 08/28/2013 FINDINGS: Brain: Generalized atrophy with mild chronic small vessel ischemia. No intracranial hemorrhage, mass effect, or midline shift. No hydrocephalus. The basilar cisterns are patent. No evidence of territorial  infarct or acute ischemia. No extra-axial or intracranial fluid collection. Vascular: Atherosclerosis of skullbase vasculature without hyperdense vessel or abnormal calcification. Skull: No fracture or focal lesion.  Calvarial hyperostosis. Sinuses/Orbits: No acute findings. Minor mucosal thickening of the ethmoid air cells. Bilateral cataract resection. Mastoid air cells are clear. Other: None. IMPRESSION: 1. No acute intracranial abnormality. No skull fracture. 2. Generalized atrophy and chronic small vessel ischemia. Electronically Signed   By: Keith Rake M.D.   On: 08/17/2020 15:39   CT Angio Chest PE W/Cm &/Or Wo Cm  Result Date: 08/17/2020 CLINICAL DATA:  Hypoxia EXAM: CT ANGIOGRAPHY CHEST WITH CONTRAST TECHNIQUE: Multidetector CT imaging of the chest was performed using the standard protocol during bolus administration of intravenous contrast. Multiplanar CT image reconstructions and MIPs were obtained to evaluate the vascular anatomy. CONTRAST:  85mL OMNIPAQUE IOHEXOL 350 MG/ML SOLN COMPARISON:  08/17/2020, 06/28/2018 FINDINGS: Cardiovascular: This is a technically adequate evaluation of the pulmonary vasculature. No filling defects or pulmonary emboli. Main pulmonary artery is mildly dilated, consistent with pulmonary arterial hypertension. This is a stable finding. The heart is stable without pericardial effusion. Stable atherosclerosis of the coronary vasculature greatest in the LAD distribution. Mild atherosclerosis of the aortic arch. Mediastinum/Nodes: No enlarged mediastinal, hilar, or axillary lymph nodes. Thyroid gland, trachea, and esophagus demonstrate no significant findings. Lungs/Pleura: Scattered areas of linear consolidation at the lung bases likely reflect atelectasis. No acute airspace disease, effusion, or pneumothorax.  Central airways are patent. Upper Abdomen: No acute abnormality. Musculoskeletal: No acute or destructive bony lesions. Stable postsurgical changes right breast.  Reconstructed images demonstrate no additional findings. Review of the MIP images confirms the above findings. IMPRESSION: 1. No evidence of pulmonary embolus. 2. Stable pulmonary arterial hypertension. 3. Aortic Atherosclerosis (ICD10-I70.0). Coronary artery atherosclerosis. Electronically Signed   By: Randa Ngo M.D.   On: 08/17/2020 15:40   DG Chest Port 1 View  Result Date: 08/17/2020 CLINICAL DATA:  Hypoxia EXAM: PORTABLE CHEST 1 VIEW COMPARISON:  02/12/2019 FINDINGS: Low volume AP portable examination. Mild cardiomegaly. Mild diffuse interstitial opacity most conspicuous at the lung bases. The visualized skeletal structures are unremarkable. IMPRESSION: Mild cardiomegaly with mild diffuse interstitial opacity most conspicuous at the lung bases, likely mild edema or atypical/viral infection. No focal airspace opacity. Electronically Signed   By: Eddie Candle M.D.   On: 08/17/2020 11:56    Procedures Procedures (including critical care time) CRITICAL CARE Performed by: Charlesetta Shanks   Total critical care time: 60 minutes  Critical care time was exclusive of separately billable procedures and treating other patients.  Critical care was necessary to treat or prevent imminent or life-threatening deterioration.  Critical care was time spent personally by me on the following activities: development of treatment plan with patient and/or surrogate as well as nursing, discussions with consultants, evaluation of patient's response to treatment, examination of patient, obtaining history from patient or surrogate, ordering and performing treatments and interventions, ordering and review of laboratory studies, ordering and review of radiographic studies, pulse oximetry and re-evaluation of patient's condition. Medications Ordered in ED Medications  0.9 %  sodium chloride infusion (has no administration in time range)  acetaminophen (TYLENOL) suppository 650 mg (650 mg Rectal Given 08/17/20 1525)    sodium chloride 0.9 % bolus 500 mL (500 mLs Intravenous New Bag/Given 08/17/20 1526)  iohexol (OMNIPAQUE) 350 MG/ML injection 75 mL (75 mLs Intravenous Contrast Given 08/17/20 1515)    ED Course  I have reviewed the triage vital signs and the nursing notes.  Pertinent labs & imaging results that were available during my care of the patient were reviewed by me and considered in my medical decision making (see chart for details).    MDM Rules/Calculators/A&P                           Consult: [facility] hospitalist for admission   Patient presented initially with symptoms concerning for sepsis.  EMS reported febrile to 102 and hypoxic at 85%.  Hypoxia responded to supplemental oxygen.  On arrival patient's temperature was 99.  Patient had clinical appearance of being ill and possible delirium however her mental status and cognitive function were nearly completely intact.  Patient had recall for recent events and answered questions appropriately and mostly accurately.  Quality of conversation reflected good cognitive function.  Patient's mouth was excessively dry, she seemed flushed and warm to the touch.  In addition to sepsis consideration given to medication reaction such as serotonin syndrome.  Patient does take Zyprexa, Seroquel and Ativan.  Multiple medications present that could have adverse reaction.  Patient however was not hyperreflexic.  She was tachycardic with very mild hypertension.  Symptoms improved with administration of Tylenol and fluids only.  This seems atypical for serotonin syndrome to improve so dramatically in a fairly short period of time.  There was report of a fall.  Patient reports she does not think she sustained any injury.  CT head does not show any acute findings.  She is neurologically intact.  Predominant abnormality is elevating troponin.  Patient denies she has any chest pain.  She denies she has had any chest pain recently, she denies shortness of breath.  EKG does not  show ischemic changes.  The presentation is extremely atypical for a cardiac event but patient will clearly need continuing monitoring and rule out for MI.  Currently her general condition is much better, speech is now clear of any slurring and patient has no appearance of somnolence.  Heart rate is regular and in the 80s.  She is off oxygen with normal saturation.  Patient will require continued diagnostic evaluation for etiology of this event and elevated troponin.  CK is not significantly elevated making myositis or muscle injury unlikely.  She is admitted to medical service to Dr. Roosevelt Locks Final Clinical Impression(s) / ED Diagnoses Final diagnoses:  Fever, unspecified fever cause  Encephalopathy  Troponin level elevated    Rx / DC Orders ED Discharge Orders    None       Charlesetta Shanks, MD 08/17/20 1645

## 2020-08-17 NOTE — H&P (Addendum)
History and Physical    [name] HYW:[id number] DOB: Apr 18, 1941 DOA: 08/17/2020  PCP: [name], MD (Confirm with patient/family/NH records and if not entered, this has to be entered at [facility] point of entry) Patient coming from: Independent living  I have personally briefly reviewed patient's old medical records in [place]  Chief Complaint: I fell  HPI: [name] is a 79 y.o. female with medical history significant of anxiety, schizoaffective disorder, diastolic CHF, bipolar disorder, hypothyroidism, chronic ambulation dysfunction baseline use of walker, presented with fall and fever.  Patient was at her usual status of health until early this morning, she was trying to get out of bed but fell down, she felt generalized weakness, denied any loss of consciousness or prodromes of lightheaded or blurred vision.  Unable to get up from the floor, she started to have whole body shaking, she did not specify as " chills".  She further denied any short of breath, no cough, no chest or abdominal pain, no muscle aching, dysuria no diarrhea.  No headache or neck pain.  She denies any changes to her medications, she has been on the diet with low carbohydrate for 4 months and lost about 25 pounds.  EMS arrived, found patient had a fever of 102, hypoxia 85% on room air, very confused.  ED Course: Patient was confused on arrival, O2 saturation improved soon after arrival.  Troponin elevated 340>1100, EKG showed chronic RBBB.  WBC 13.2, creatinine 1.1. Chest x-ray no acute infiltrates, CT angiogram negative for PE, small bilateral lower field infiltrates. Review of Systems: As per HPI otherwise 14 point review of systems negative.   Past Medical History:  Diagnosis Date  . Anxiety   . Arrhythmia    right bundle branch block  . Bipolar 1 disorder ([place])   . Cancer ([place])   . Hyperlipidemia   . Hypertension   . Morbid obesity ([place])   . Osteoarthritis   . Schizo-affective schizophrenia  ([place])   . Thyroid disease    hypothyroidism    Past Surgical History:  Procedure Laterality Date  . BREAST SURGERY    . CHOLECYSTECTOMY    . DILATION AND CURETTAGE OF UTERUS    . ESOPHAGEAL MANOMETRY N/A 01/29/2018   Procedure: ESOPHAGEAL MANOMETRY (EM);  Surgeon: Wonda Horner, MD;  Location: [facility];  Service: Endoscopy;  Laterality: N/A;  . HAND SURGERY     Right-trigger finger  . IR THORACENTESIS ASP PLEURAL SPACE W/IMG GUIDE  06/28/2018  . IR THORACENTESIS ASP PLEURAL SPACE W/IMG GUIDE  07/19/2018  . KNEE SURGERY     Left  . TONSILLECTOMY       reports that she quit smoking about 41 years ago. She has never used smokeless tobacco. She reports current alcohol use. She reports that she does not use drugs.  Allergies  Allergen Reactions  . Lithium Nausea Only  . Fluoxetine Other (See Comments)    Pt felt crazy  . Macrodantin Other (See Comments)    unknown  . Mirabegron Other (See Comments)    ineffective  . Paroxetine Hcl     Other reaction(s): Other (See Comments) Made pt feel crazy  . Paxil [Paroxetine] Other (See Comments)    Made pt feel crazy   . Prednisone     Makes her feel very jittery  . Prozac [Fluoxetine Hcl] Other (See Comments)    Pt felt crazy   . Sertraline Hcl   . Solifenacin Other (See Comments)  Ineffective   . Sulfa Antibiotics Other (See Comments)    unknown  . Wellbutrin [Bupropion Hcl] Other (See Comments)    unknown    Family History  Problem Relation Age of Onset  . Hypertension Mother      Prior to Admission medications   Medication Sig Start Date End Date Taking? Authorizing Provider  ALPRAZolam Duanne Moron) 0.5 MG tablet Take 1 mg by mouth 3 (three) times daily as needed for anxiety.    Yes [provider]  CALCIUM PO Take 2 tablets by mouth daily.    Yes [provider]  cholecalciferol (VITAMIN D3) 25 MCG (1000 UT) tablet Take 1,000 Units by mouth daily. Wasn't sure if 1000 or 2000 units daily   Yes  [provider]  Cyanocobalamin (VITAMIN B-12 PO) Take 1 tablet by mouth daily.    Yes [provider]  donepezil (ARICEPT) 5 MG tablet Take 5 mg by mouth daily.   Yes [provider]  fenofibrate (TRICOR) 145 MG tablet TAKE 1 TABLET EVERY TUESDAY,THURSDAY,AND SATURDAY AT 6 PM Patient taking differently: Take 145 mg by mouth 3 (three) times a week. TAKE 1 TABLET EVERY TUESDAY,THURSDAY,AND SATURDAY AT 6 PM 05/28/20  Yes Skeet Latch, MD  gabapentin (NEURONTIN) 100 MG capsule TAKE 2 CAPSULES (200 MG TOTAL) BY MOUTH AT BEDTIME. 08/10/20  Yes Jaffe, Adam R, DO  HYDROcodone-acetaminophen (NORCO/VICODIN) 5-325 MG tablet Take 1 tablet by mouth daily as needed for moderate pain.    Yes [provider]  lamoTRIgine (LAMICTAL) 150 MG tablet Take 150 mg by mouth every evening.  04/06/16  Yes [provider]  metoprolol tartrate (LOPRESSOR) 25 MG tablet Take 1 tablet (25 mg total) by mouth daily. Please schedule an appt for further refills. 2nd attempt Patient taking differently: Take 25 mg by mouth 2 (two) times daily.  12/22/19  Yes Kilroy, Luke K, PA-C  Multiple Vitamins-Minerals (MULTIVITAMIN ADULT PO) Take 1 tablet by mouth daily.   Yes [provider]  OLANZapine (ZYPREXA) 15 MG tablet Take 30 mg by mouth at bedtime.    Yes [provider]  Omega-3 Fatty Acids (FISH OIL) 1000 MG CAPS Take 1,000 mg by mouth daily.   Yes [provider]  omeprazole (PRILOSEC) 40 MG capsule Take 40 mg by mouth daily.   Yes [provider]  ondansetron (ZOFRAN) 4 MG tablet Take 4 mg by mouth daily as needed for nausea or vomiting.  01/11/20  Yes [provider]  QUEtiapine (SEROQUEL) 25 MG tablet Take 50 mg by mouth at bedtime.    Yes [provider]  ramelteon (ROZEREM) 8 MG tablet Take 8 mg by mouth at bedtime as needed for sleep.    Yes [provider]  spironolactone (ALDACTONE) 25 MG tablet Take 1 tablet (25 mg  total) by mouth daily. 06/26/20  Yes Kilroy, Luke K, PA-C  SYNTHROID 150 MCG tablet TAKE 1 TABLET (150 MCG TOTAL) BY MOUTH DAILY. Patient taking differently: Take 150 mcg by mouth daily.  10/23/15  Yes Darlin Coco, MD  amLODipine (NORVASC) 2.5 MG tablet Take 2.5 mg by mouth at bedtime. Patient not taking: Reported on 08/17/2020 06/30/20   [provider]    Physical Exam: Vitals:   08/17/20 1115 08/17/20 1233 08/17/20 1400 08/17/20 1500  BP: (!) 134/58  (!) 123/52 (!) 130/55  Pulse: (!) 111  94 89  Resp: (!) 23  (!) 25 (!) 24  Temp:  99 F (37.2 C)    TempSrc:  Rectal    SpO2: 93%  93% 98%    Constitutional: NAD, calm, comfortable Vitals:   08/17/20 1115 08/17/20 1233 08/17/20 1400 08/17/20 1500  BP: (!) 134/58  (!) 123/52 (!) 130/55  Pulse: (!) 111  94 89  Resp: (!) 23  (!) 25 (!) 24  Temp:  99 F (37.2 C)    TempSrc:  Rectal    SpO2: 93%  93% 98%   Eyes: PERRL, lids and conjunctivae normal ENMT: Mucous membranes are moist. Posterior pharynx clear of any exudate or lesions.Normal dentition.  Neck: normal, supple, no masses, no thyromegaly Respiratory: clear to auscultation bilaterally, no wheezing, no crackles. Normal respiratory effort. No accessory muscle use.  Cardiovascular: Regular rate and rhythm, soft systolic murmur heart base. No extremity edema. 2+ pedal pulses. No carotid bruits.  Abdomen: no tenderness, no masses palpated. No hepatosplenomegaly. Bowel sounds positive.  Musculoskeletal: no clubbing / cyanosis. No joint deformity upper and lower extremities. Good ROM, no contractures. Normal muscle tone.  Skin: no rashes, lesions, ulcers. No induration Neurologic: CN 2-12 grossly intact. Sensation intact, DTR normal. Strength 5/5 in all 4.  Psychiatric: Normal judgment and insight. Alert and oriented x 3. Normal mood.     Labs on Admission: I have personally reviewed following labs and imaging studies  CBC: Recent Labs  Lab 08/17/20 1135  08/17/20 1148  WBC 13.2*  --   NEUTROABS 11.2*  --   HGB 14.0 13.9  HCT 42.9 41.0  MCV 96.4  --   PLT 224  --    Basic Metabolic Panel: Recent Labs  Lab 08/17/20 1135 08/17/20 1148  NA 141 142  K 3.7 3.5  CL 106  --   CO2 22  --   GLUCOSE 124*  --   BUN 15  --   CREATININE 1.10*  --   CALCIUM 10.4*  --   MG 1.9  --   PHOS 2.5  --    GFR: CrCl cannot be calculated (Unknown ideal weight.). Liver Function Tests: Recent Labs  Lab 08/17/20 1135  AST 29  ALT 34  ALKPHOS 41  BILITOT 0.9  PROT 7.2  ALBUMIN 4.0   No results for input(s): LIPASE, AMYLASE in the last 168 hours. Recent Labs  Lab 08/17/20 1135  AMMONIA 38*   Coagulation Profile: No results for input(s): INR, PROTIME in the last 168 hours. Cardiac Enzymes: Recent Labs  Lab 08/17/20 1520  CKTOTAL 231   BNP (last 3 results) No results for input(s): PROBNP in the last 8760 hours. HbA1C: No results for input(s): HGBA1C in the last 72 hours. CBG: Recent Labs  Lab 08/17/20 1109  GLUCAP 132*   Lipid Profile: Recent Labs    08/17/20 1135  TRIG 138   Thyroid Function Tests: No results for input(s): TSH, T4TOTAL, FREET4, T3FREE, THYROIDAB in the last 72 hours. Anemia Panel: Recent Labs    08/17/20 1135  FERRITIN 97   Urine analysis:    Component Value Date/Time   COLORURINE YELLOW 02/12/2019 2352   APPEARANCEUR CLEAR 02/12/2019 2352   LABSPEC 1.020 02/12/2019 2352   PHURINE 5.0 02/12/2019 2352   GLUCOSEU NEGATIVE 02/12/2019 2352   HGBUR NEGATIVE 02/12/2019 2352   BILIRUBINUR NEGATIVE 02/12/2019 2352   KETONESUR NEGATIVE 02/12/2019 2352   PROTEINUR NEGATIVE 02/12/2019 2352   NITRITE NEGATIVE 02/12/2019 2352   LEUKOCYTESUR NEGATIVE 02/12/2019 2352    Radiological Exams on Admission: CT Head Wo Contrast  Result Date: 08/17/2020 CLINICAL DATA:  Head trauma.  Unwitnessed fall.  EXAM: CT HEAD WITHOUT CONTRAST TECHNIQUE: Contiguous axial images were obtained from the base of the skull  through the vertex without intravenous contrast. COMPARISON:  Head CT 08/28/2013 FINDINGS: Brain: Generalized atrophy with mild chronic small vessel ischemia. No intracranial hemorrhage, mass effect, or midline shift. No hydrocephalus. The basilar cisterns are patent. No evidence of territorial infarct or acute ischemia. No extra-axial or intracranial fluid collection. Vascular: Atherosclerosis of skullbase vasculature without hyperdense vessel or abnormal calcification. Skull: No fracture or focal lesion.  Calvarial hyperostosis. Sinuses/Orbits: No acute findings. Minor mucosal thickening of the ethmoid air cells. Bilateral cataract resection. Mastoid air cells are clear. Other: None. IMPRESSION: 1. No acute intracranial abnormality. No skull fracture. 2. Generalized atrophy and chronic small vessel ischemia. Electronically Signed   By: Keith Rake M.D.   On: 08/17/2020 15:39   CT Angio Chest PE W/Cm &/Or Wo Cm  Result Date: 08/17/2020 CLINICAL DATA:  Hypoxia EXAM: CT ANGIOGRAPHY CHEST WITH CONTRAST TECHNIQUE: Multidetector CT imaging of the chest was performed using the standard protocol during bolus administration of intravenous contrast. Multiplanar CT image reconstructions and MIPs were obtained to evaluate the vascular anatomy. CONTRAST:  58mL OMNIPAQUE IOHEXOL 350 MG/ML SOLN COMPARISON:  08/17/2020, 06/28/2018 FINDINGS: Cardiovascular: This is a technically adequate evaluation of the pulmonary vasculature. No filling defects or pulmonary emboli. Main pulmonary artery is mildly dilated, consistent with pulmonary arterial hypertension. This is a stable finding. The heart is stable without pericardial effusion. Stable atherosclerosis of the coronary vasculature greatest in the LAD distribution. Mild atherosclerosis of the aortic arch. Mediastinum/Nodes: No enlarged mediastinal, hilar, or axillary lymph nodes. Thyroid gland, trachea, and esophagus demonstrate no significant findings. Lungs/Pleura:  Scattered areas of linear consolidation at the lung bases likely reflect atelectasis. No acute airspace disease, effusion, or pneumothorax. Central airways are patent. Upper Abdomen: No acute abnormality. Musculoskeletal: No acute or destructive bony lesions. Stable postsurgical changes right breast. Reconstructed images demonstrate no additional findings. Review of the MIP images confirms the above findings. IMPRESSION: 1. No evidence of pulmonary embolus. 2. Stable pulmonary arterial hypertension. 3. Aortic Atherosclerosis (ICD10-I70.0). Coronary artery atherosclerosis. Electronically Signed   By: Randa Ngo M.D.   On: 08/17/2020 15:40   DG Chest Port 1 View  Result Date: 08/17/2020 CLINICAL DATA:  Hypoxia EXAM: PORTABLE CHEST 1 VIEW COMPARISON:  02/12/2019 FINDINGS: Low volume AP portable examination. Mild cardiomegaly. Mild diffuse interstitial opacity most conspicuous at the lung bases. The visualized skeletal structures are unremarkable. IMPRESSION: Mild cardiomegaly with mild diffuse interstitial opacity most conspicuous at the lung bases, likely mild edema or atypical/viral infection. No focal airspace opacity. Electronically Signed   By: Eddie Candle M.D.   On: 08/17/2020 11:56    EKG: Independently reviewed.  Chronic RBBB  Assessment/Plan Active Problems:   * No active hospital problems. *  (please populate well all problems here in Problem List. (For example, if patient is on BP meds at home and you resume or decide to hold them, it is a problem that needs to be her. Same for CAD, COPD, HLD and so on)  Fever -Etiology not clear. History indicated that the patient had " whole body shaking" and then EMS arrived found patient had 102 fever, which I suspect the shaking patient describe probably was chills and rigors. Patient did have hypoxia on the scene and briefly in the ED, CT angiogram negative for PE but appeared to be small infiltrates on the right base with some bronchal aerogram.  Patient denied a history of aspiration  or choking after eating. Will send a speech evaluation tomorrow. But doubt this March of intractable cough secondary to fever without any other signs of SIRS/sepsis. And her procalcitonin level remains low, will hold off antibiotics for tonight. -She has been taking multiple psych medications including SSRI, antipsychotic, but she never had a history of overdose and physical exam not revealing muscle tone changes, tremors, sweating, vision changes, make serotonin syndrome less likely -UA pending, patient had 2 episodes of UTI in her past and she described none of them felt like today's symptoms.  Acute metabolic encephalopathy -Resolved, CT head showed no acute intracranial findings. Suspect this might related to the brief episode of hypoxia and fever.  Positive troponins -Significant elevation of troponins 300>1100, no EKG ST-T changes -Discussed with cardiology, whose initial opinion is this is from demanding ischemia from brief episode of hypoxia, not recommending ACS treatment. -We will send repeat troponin in the morning and check echocardiogram -Outpatient stress test?  Leukocytosis -Follow-up UA, monitor off antibiotics for now  Schizoaffective disorder -Continue Seroquel and Lamictal and Zyprexa  Hypothyroidism -Check TSH, continue Synthroid  HTN -Continue beta-blocker and Aldactone  Fall -Neuro exam nonfocal, likely related to fever -PT evaluation  DVT prophylaxis: Lovenox Code Status: Full Family Communication: None at bedside Disposition Plan: Expect more than 2 midnight hospital stay for fever work-up and possible cardiology work-up if indicated Consults called: Cardiology Admission status: Telemetry admission   Lequita Halt MD [facility] Pager [phone]  08/17/2020, 4:28 PM

## 2020-08-17 NOTE — ED Triage Notes (Signed)
Pt here via [facility] from [place] for unwitnessed fall.  Pt was found by staff.  Initial sats were 85% on ra that increased to 93% on 3 L.  Staff stated pt is normally ao x 4 but pt was unable to state the date.

## 2020-08-17 NOTE — Consult Note (Signed)
Cardiology Consultation:   Patient ID: [name] MRN: [id number]; DOB: 09/11/1941  Admit date: 08/17/2020 Date of Consult: 08/17/2020  Primary Care Provider: [name], [facility] Cardiologist: Skeet Latch, MD    Patient Profile:   [name] is a 79 y.o. female with a hx of schizoaffective disorder, bipolar disorder, breast cancer, hypertension, hyperlipidemia, previous right bundle branch block who is being seen today for the evaluation of elevated troponin at the request of Wynetta Fines, MD.  History of Present Illness:   Echocardiogram July 2019 showed vigorous LV function, mild left ventricular hypertrophy, narrow left ventricular outflow tract, grade 1 diastolic dysfunction, trace aortic insufficiency.  She was noted to have coronary calcification on CT scan July 2019.  Patient lives at [facility].  She ambulates with a walker and has mild dyspnea on exertion which is chronic.  No orthopnea, PND, pedal edema, chest pain or syncope.  Last evening she was "shaking in her arms and legs".  She had generalized weakness.  She stood but because of her weakness let herself to the ground.  She did not have syncope.  There was no chest pain, palpitations or dyspnea.  She did not have trauma.  She was unable to get up.  EMS was called.  She was noted to be febrile by EMS with a temperature of 102 and somewhat lethargic.  She was hypoxic with saturation of 85%.  Patient apparently may have taken an extra Xanax or extra Seroquel to assist with sleeping.  She is being admitted and troponin noted to be elevated.  Cardiology asked to evaluate.   Past Medical History:  Diagnosis Date  . Anxiety   . Arrhythmia    right bundle branch block  . Bipolar 1 disorder ([place])   . Cancer ([place])   . Hyperlipidemia   . Hypertension   . Morbid obesity ([place])   . Osteoarthritis   . Schizo-affective schizophrenia ([place])   . Thyroid disease    hypothyroidism    Past Surgical History:    Procedure Laterality Date  . BREAST SURGERY    . CHOLECYSTECTOMY    . DILATION AND CURETTAGE OF UTERUS    . ESOPHAGEAL MANOMETRY N/A 01/29/2018   Procedure: ESOPHAGEAL MANOMETRY (EM);  Surgeon: Wonda Horner, MD;  Location: [facility];  Service: Endoscopy;  Laterality: N/A;  . HAND SURGERY     Right-trigger finger  . IR THORACENTESIS ASP PLEURAL SPACE W/IMG GUIDE  06/28/2018  . IR THORACENTESIS ASP PLEURAL SPACE W/IMG GUIDE  07/19/2018  . KNEE SURGERY     Left  . TONSILLECTOMY       Inpatient Medications: Scheduled Meds: . aspirin EC  81 mg Oral Daily  . calcium-vitamin D  1 tablet Oral Daily  . cholecalciferol  1,000 Units Oral Daily  . donepezil  5 mg Oral Daily  . enoxaparin (LOVENOX) injection  40 mg Subcutaneous Q24H  . gabapentin  200 mg Oral QHS  . lamoTRIgine  150 mg Oral QPM  . [START ON 08/18/2020] levothyroxine  150 mcg Oral Daily  . metoprolol tartrate  25 mg Oral BID  . multivitamin with minerals  1 tablet Oral Daily  . OLANZapine  30 mg Oral QHS  . omega-3 acid ethyl esters  1 g Oral Daily  . pantoprazole  40 mg Oral Daily  . QUEtiapine  50 mg Oral QHS  . spironolactone  25 mg Oral Daily  . vitamin B-12  100 mcg Oral Daily   Continuous Infusions: .  sodium chloride 1,000 mL (08/17/20 1721)   PRN Meds: acetaminophen **OR** acetaminophen, ALPRAZolam, HYDROcodone-acetaminophen, ondansetron **OR** ondansetron (ZOFRAN) IV, ramelteon  Allergies:    Allergies  Allergen Reactions  . Lithium Nausea Only  . Fluoxetine Other (See Comments)    Pt felt crazy  . Macrodantin Other (See Comments)    unknown  . Mirabegron Other (See Comments)    ineffective  . Paroxetine Hcl     Other reaction(s): Other (See Comments) Made pt feel crazy  . Paxil [Paroxetine] Other (See Comments)    Made pt feel crazy   . Prednisone     Makes her feel very jittery  . Prozac [Fluoxetine Hcl] Other (See Comments)    Pt felt crazy   . Sertraline Hcl   . Solifenacin Other (See  Comments)    Ineffective   . Sulfa Antibiotics Other (See Comments)    unknown  . Wellbutrin [Bupropion Hcl] Other (See Comments)    unknown    Social History:   Social History   Socioeconomic History  . Marital status: Divorced    Spouse name: Not on file  . Number of children: Not on file  . Years of education: Not on file  . Highest education level: Not on file  Occupational History  . Not on file  Tobacco Use  . Smoking status: Former Smoker    Quit date: 12/08/1978    Years since quitting: 41.7  . Smokeless tobacco: Never Used  Substance and Sexual Activity  . Alcohol use: Yes    Comment: 1 time a year  . Drug use: No  . Sexual activity: Not on file  Other Topics Concern  . Not on file  Social History Narrative   Lives in [place] (has been there 10 years) - states in independent living / has a Marine scientist helps her with meds      Degrees in English Lit and nursing      No caffeine      Social Determinants of Health   Financial Resource Strain:   . Difficulty of Paying Living Expenses: Not on file  Food Insecurity:   . Worried About Charity fundraiser in the Last Year: Not on file  . Ran Out of Food in the Last Year: Not on file  Transportation Needs:   . Lack of Transportation (Medical): Not on file  . Lack of Transportation (Non-Medical): Not on file  Physical Activity:   . Days of Exercise per Week: Not on file  . Minutes of Exercise per Session: Not on file  Stress:   . Feeling of Stress : Not on file  Social Connections:   . Frequency of Communication with Friends and Family: Not on file  . Frequency of Social Gatherings with Friends and Family: Not on file  . Attends Religious Services: Not on file  . Active Member of Clubs or Organizations: Not on file  . Attends Archivist Meetings: Not on file  . Marital Status: Not on file  Intimate Partner Violence:   . Fear of Current or Ex-Partner: Not on file  . Emotionally Abused: Not on file    . Physically Abused: Not on file  . Sexually Abused: Not on file    Family History:    Family History  Problem Relation Age of Onset  . Hypertension Mother      ROS:  Please see the history of present illness.  Patient describes shaking in her extremities and generalized weakness. All other  ROS reviewed and negative.     Physical Exam/Data:   Vitals:   08/17/20 1400 08/17/20 1500 08/17/20 1630 08/17/20 1700  BP: (!) 123/52 (!) 130/55 127/79 (!) 122/59  Pulse: 94 89 81 80  Resp: (!) 25 (!) 24 14 18   Temp:      TempSrc:      SpO2: 93% 98% 97% 96%   No intake or output data in the 24 hours ending 08/17/20 1751 Last 3 Weights 07/05/2020 09/13/2019 02/13/2019  Weight (lbs) 159 lb 6.4 oz 186 lb 178 lb 12.7 oz  Weight (kg) 72.303 kg 84.369 kg 81.1 kg     There is no height or weight on file to calculate BMI.  General:  Well nourished, obese, in no acute distress HEENT: normal Lymph: no adenopathy Neck: no JVD Endocrine:  No thryomegaly Vascular: No carotid bruits; FA pulses 2+ bilaterally without bruits  Cardiac:  normal S1, S2; RRR; 2/6 systolic murmur left sternal border. Lungs:  clear to auscultation bilaterally, no wheezing, rhonchi or rales  Abd: soft, nontender, no hepatomegaly  Ext: no edema; chronic skin changes Musculoskeletal:  No deformities, BUE and BLE strength normal and equal Skin: warm and dry  Neuro:  CNs 2-12 intact, no focal abnormalities noted Psych:  Normal affect   EKG:  The EKG was personally reviewed and demonstrates: Sinus tachycardia, right bundle branch block, left ventricular hypertrophy.  Laboratory Data:  High Sensitivity Troponin:   Recent Labs  Lab 08/17/20 1135 08/17/20 1520  TROPONINIHS 340* 1,162*     Chemistry Recent Labs  Lab 08/17/20 1135 08/17/20 1148  NA 141 142  K 3.7 3.5  CL 106  --   CO2 22  --   GLUCOSE 124*  --   BUN 15  --   CREATININE 1.10*  --   CALCIUM 10.4*  --   GFRNONAA 47*  --   GFRAA 55*  --    ANIONGAP 13  --     Recent Labs  Lab 08/17/20 1135  PROT 7.2  ALBUMIN 4.0  AST 29  ALT 34  ALKPHOS 41  BILITOT 0.9   Hematology Recent Labs  Lab 08/17/20 1135 08/17/20 1148  WBC 13.2*  --   RBC 4.45  --   HGB 14.0 13.9  HCT 42.9 41.0  MCV 96.4  --   MCH 31.5  --   MCHC 32.6  --   RDW 13.3  --   PLT 224  --    BNP Recent Labs  Lab 08/17/20 1135  BNP 156.5*    DDimer  Recent Labs  Lab 08/17/20 1135  DDIMER 0.62*     Radiology/Studies:  CT Head Wo Contrast  Result Date: 08/17/2020 CLINICAL DATA:  Head trauma.  Unwitnessed fall. EXAM: CT HEAD WITHOUT CONTRAST TECHNIQUE: Contiguous axial images were obtained from the base of the skull through the vertex without intravenous contrast. COMPARISON:  Head CT 08/28/2013 FINDINGS: Brain: Generalized atrophy with mild chronic small vessel ischemia. No intracranial hemorrhage, mass effect, or midline shift. No hydrocephalus. The basilar cisterns are patent. No evidence of territorial infarct or acute ischemia. No extra-axial or intracranial fluid collection. Vascular: Atherosclerosis of skullbase vasculature without hyperdense vessel or abnormal calcification. Skull: No fracture or focal lesion.  Calvarial hyperostosis. Sinuses/Orbits: No acute findings. Minor mucosal thickening of the ethmoid air cells. Bilateral cataract resection. Mastoid air cells are clear. Other: None. IMPRESSION: 1. No acute intracranial abnormality. No skull fracture. 2. Generalized atrophy and chronic small vessel ischemia. Electronically Signed  By: Keith Rake M.D.   On: 08/17/2020 15:39   CT Angio Chest PE W/Cm &/Or Wo Cm  Result Date: 08/17/2020 CLINICAL DATA:  Hypoxia EXAM: CT ANGIOGRAPHY CHEST WITH CONTRAST TECHNIQUE: Multidetector CT imaging of the chest was performed using the standard protocol during bolus administration of intravenous contrast. Multiplanar CT image reconstructions and MIPs were obtained to evaluate the vascular anatomy.  CONTRAST:  54mL OMNIPAQUE IOHEXOL 350 MG/ML SOLN COMPARISON:  08/17/2020, 06/28/2018 FINDINGS: Cardiovascular: This is a technically adequate evaluation of the pulmonary vasculature. No filling defects or pulmonary emboli. Main pulmonary artery is mildly dilated, consistent with pulmonary arterial hypertension. This is a stable finding. The heart is stable without pericardial effusion. Stable atherosclerosis of the coronary vasculature greatest in the LAD distribution. Mild atherosclerosis of the aortic arch. Mediastinum/Nodes: No enlarged mediastinal, hilar, or axillary lymph nodes. Thyroid gland, trachea, and esophagus demonstrate no significant findings. Lungs/Pleura: Scattered areas of linear consolidation at the lung bases likely reflect atelectasis. No acute airspace disease, effusion, or pneumothorax. Central airways are patent. Upper Abdomen: No acute abnormality. Musculoskeletal: No acute or destructive bony lesions. Stable postsurgical changes right breast. Reconstructed images demonstrate no additional findings. Review of the MIP images confirms the above findings. IMPRESSION: 1. No evidence of pulmonary embolus. 2. Stable pulmonary arterial hypertension. 3. Aortic Atherosclerosis (ICD10-I70.0). Coronary artery atherosclerosis. Electronically Signed   By: Randa Ngo M.D.   On: 08/17/2020 15:40   DG Chest Port 1 View  Result Date: 08/17/2020 CLINICAL DATA:  Hypoxia EXAM: PORTABLE CHEST 1 VIEW COMPARISON:  02/12/2019 FINDINGS: Low volume AP portable examination. Mild cardiomegaly. Mild diffuse interstitial opacity most conspicuous at the lung bases. The visualized skeletal structures are unremarkable. IMPRESSION: Mild cardiomegaly with mild diffuse interstitial opacity most conspicuous at the lung bases, likely mild edema or atypical/viral infection. No focal airspace opacity. Electronically Signed   By: Eddie Candle M.D.   On: 08/17/2020 11:56    Assessment and Plan:   1. Elevated  troponin-troponin minimally elevated but patient has no symptoms of chest pain.  Electrocardiogram shows no acute ST changes.  Likely demand ischemia in the setting of infection.  We will arrange an echocardiogram to assess LV function.  If normal can pursue outpatient nuclear study when she improves. 2. Coronary artery disease-chest CT shows no pulmonary embolus but there is coronary calcification.  Continue aspirin.  We will add Crestor 20 mg daily.  Check lipids and liver in 12 weeks. 3. Hypertension-blood pressure controlled.  Continue preadmission medications. 4. Possible infection-management per primary care.  For questions or updates, please contact [place] Please consult [email or web address] for contact info under    Signed, Kirk Ruths, MD  08/17/2020 5:51 PM

## 2020-08-18 ENCOUNTER — Inpatient Hospital Stay (HOSPITAL_COMMUNITY): Payer: Medicare HMO

## 2020-08-18 ENCOUNTER — Other Ambulatory Visit: Payer: Self-pay | Admitting: Physician Assistant

## 2020-08-18 DIAGNOSIS — R509 Fever, unspecified: Secondary | ICD-10-CM

## 2020-08-18 DIAGNOSIS — I214 Non-ST elevation (NSTEMI) myocardial infarction: Secondary | ICD-10-CM

## 2020-08-18 DIAGNOSIS — J189 Pneumonia, unspecified organism: Secondary | ICD-10-CM

## 2020-08-18 DIAGNOSIS — I9589 Other hypotension: Secondary | ICD-10-CM

## 2020-08-18 DIAGNOSIS — I259 Chronic ischemic heart disease, unspecified: Secondary | ICD-10-CM

## 2020-08-18 DIAGNOSIS — R778 Other specified abnormalities of plasma proteins: Secondary | ICD-10-CM

## 2020-08-18 LAB — ECHOCARDIOGRAM COMPLETE
Area-P 1/2: 1.89 cm2
S' Lateral: 2.7 cm

## 2020-08-18 LAB — URINALYSIS, ROUTINE W REFLEX MICROSCOPIC
Bilirubin Urine: NEGATIVE
Glucose, UA: NEGATIVE mg/dL
Hgb urine dipstick: NEGATIVE
Ketones, ur: NEGATIVE mg/dL
Leukocytes,Ua: NEGATIVE
Nitrite: NEGATIVE
Protein, ur: NEGATIVE mg/dL
Specific Gravity, Urine: 1.02 (ref 1.005–1.030)
pH: 6 (ref 5.0–8.0)

## 2020-08-18 LAB — T4, FREE: Free T4: 1.04 ng/dL (ref 0.61–1.12)

## 2020-08-18 LAB — TROPONIN I (HIGH SENSITIVITY)
Troponin I (High Sensitivity): 1741 ng/L (ref <18)
Troponin I (High Sensitivity): 1245 ng/L (ref <18)

## 2020-08-18 LAB — ECHOCARDIOGRAM LIMITED

## 2020-08-18 MED ORDER — LACTATED RINGERS IV BOLUS
500.0000 mL | Freq: Once | INTRAVENOUS | Status: AC
  Administered 2020-08-18: 500 mL via INTRAVENOUS

## 2020-08-18 MED ORDER — SODIUM CHLORIDE 0.9 % IV BOLUS
500.0000 mL | Freq: Once | INTRAVENOUS | Status: AC
  Administered 2020-08-18: 500 mL via INTRAVENOUS

## 2020-08-18 MED ORDER — PERFLUTREN LIPID MICROSPHERE
1.0000 mL | INTRAVENOUS | Status: AC | PRN
  Administered 2020-08-18: 5 mL via INTRAVENOUS
  Filled 2020-08-18: qty 10

## 2020-08-18 MED ORDER — SODIUM CHLORIDE 0.9 % IV SOLN
1.0000 g | Freq: Every day | INTRAVENOUS | Status: DC
  Administered 2020-08-18 – 2020-08-20 (×3): 1 g via INTRAVENOUS
  Filled 2020-08-18 (×3): qty 10

## 2020-08-18 MED ORDER — DEXTROSE 5 % IV SOLN
250.0000 mg | INTRAVENOUS | Status: DC
  Administered 2020-08-18 – 2020-08-19 (×2): 250 mg via INTRAVENOUS
  Filled 2020-08-18 (×4): qty 250

## 2020-08-18 NOTE — Progress Notes (Signed)
  Echocardiogram 2D Echocardiogram has been performed.  Jennette Dubin 08/18/2020, 9:17 AM

## 2020-08-18 NOTE — Progress Notes (Signed)
Progress Note  Patient Name: [name] Date of Encounter: 08/18/2020  Primary Cardiologist: Skeet Latch, MD   Subjective   Denies any chest pain or SOB.  She does not appear confused this am.  Inpatient Medications    Scheduled Meds: . aspirin EC  81 mg Oral Daily  . calcium-vitamin D  1 tablet Oral Daily  . cholecalciferol  1,000 Units Oral Daily  . donepezil  5 mg Oral Daily  . enoxaparin (LOVENOX) injection  40 mg Subcutaneous Q24H  . gabapentin  200 mg Oral QHS  . lamoTRIgine  150 mg Oral QPM  . levothyroxine  150 mcg Oral Daily  . metoprolol tartrate  25 mg Oral BID  . multivitamin with minerals  1 tablet Oral Daily  . OLANZapine  30 mg Oral QHS  . omega-3 acid ethyl esters  1 g Oral Daily  . pantoprazole  40 mg Oral Daily  . QUEtiapine  50 mg Oral QHS  . spironolactone  25 mg Oral Daily  . vitamin B-12  100 mcg Oral Daily   Continuous Infusions: . sodium chloride 1,000 mL (08/18/20 0259)  . azithromycin    . cefTRIAXone (ROCEPHIN)  IV 1 g (08/18/20 0828)   PRN Meds: acetaminophen **OR** acetaminophen, ALPRAZolam, ondansetron **OR** ondansetron (ZOFRAN) IV, ramelteon   Vital Signs    Vitals:   08/18/20 0400 08/18/20 0445 08/18/20 0700 08/18/20 0800  BP: (!) 106/51 93/61 (!) 99/44 (!) 107/53  Pulse: 64 62 60 64  Resp: (!) 22 19 (!) 24 (!) 21  Temp:      TempSrc:      SpO2: 96% 93% 93% 95%    Intake/Output Summary (Last 24 hours) at 08/18/2020 0943 Last data filed at 08/18/2020 0022 Gross per 24 hour  Intake --  Output 500 ml  Net -500 ml   There were no vitals filed for this visit.  Telemetry    NSR - Personally Reviewed  ECG    NSR with LVH with repol abnormality and RBBB - unchanged from 06/2018 - Personally Reviewed  Physical Exam   GEN: No acute distress.   Neck: No JVD Cardiac: RRR, no murmurs, rubs, or gallops.  Respiratory: Clear to auscultation bilaterally. GI: Soft, nontender, non-distended  MS: No edema; No  deformity. Neuro:  Nonfocal  Psych: Normal affect   Labs    Chemistry Recent Labs  Lab 08/17/20 1135 08/17/20 1148  NA 141 142  K 3.7 3.5  CL 106  --   CO2 22  --   GLUCOSE 124*  --   BUN 15  --   CREATININE 1.10*  --   CALCIUM 10.4*  --   PROT 7.2  --   ALBUMIN 4.0  --   AST 29  --   ALT 34  --   ALKPHOS 41  --   BILITOT 0.9  --   GFRNONAA 47*  --   GFRAA 55*  --   ANIONGAP 13  --      Hematology Recent Labs  Lab 08/17/20 1135 08/17/20 1148  WBC 13.2*  --   RBC 4.45  --   HGB 14.0 13.9  HCT 42.9 41.0  MCV 96.4  --   MCH 31.5  --   MCHC 32.6  --   RDW 13.3  --   PLT 224  --     Cardiac EnzymesNo results for input(s): TROPONINI in the last 168 hours. No results for input(s): TROPIPOC in the last 168 hours.  BNP Recent Labs  Lab 08/17/20 1135  BNP 156.5*     DDimer  Recent Labs  Lab 08/17/20 1135  DDIMER 0.62*     Radiology    CT Head Wo Contrast  Result Date: 08/17/2020 CLINICAL DATA:  Head trauma.  Unwitnessed fall. EXAM: CT HEAD WITHOUT CONTRAST TECHNIQUE: Contiguous axial images were obtained from the base of the skull through the vertex without intravenous contrast. COMPARISON:  Head CT 08/28/2013 FINDINGS: Brain: Generalized atrophy with mild chronic small vessel ischemia. No intracranial hemorrhage, mass effect, or midline shift. No hydrocephalus. The basilar cisterns are patent. No evidence of territorial infarct or acute ischemia. No extra-axial or intracranial fluid collection. Vascular: Atherosclerosis of skullbase vasculature without hyperdense vessel or abnormal calcification. Skull: No fracture or focal lesion.  Calvarial hyperostosis. Sinuses/Orbits: No acute findings. Minor mucosal thickening of the ethmoid air cells. Bilateral cataract resection. Mastoid air cells are clear. Other: None. IMPRESSION: 1. No acute intracranial abnormality. No skull fracture. 2. Generalized atrophy and chronic small vessel ischemia. Electronically Signed    By: Keith Rake M.D.   On: 08/17/2020 15:39   CT Angio Chest PE W/Cm &/Or Wo Cm  Result Date: 08/17/2020 CLINICAL DATA:  Hypoxia EXAM: CT ANGIOGRAPHY CHEST WITH CONTRAST TECHNIQUE: Multidetector CT imaging of the chest was performed using the standard protocol during bolus administration of intravenous contrast. Multiplanar CT image reconstructions and MIPs were obtained to evaluate the vascular anatomy. CONTRAST:  6mL OMNIPAQUE IOHEXOL 350 MG/ML SOLN COMPARISON:  08/17/2020, 06/28/2018 FINDINGS: Cardiovascular: This is a technically adequate evaluation of the pulmonary vasculature. No filling defects or pulmonary emboli. Main pulmonary artery is mildly dilated, consistent with pulmonary arterial hypertension. This is a stable finding. The heart is stable without pericardial effusion. Stable atherosclerosis of the coronary vasculature greatest in the LAD distribution. Mild atherosclerosis of the aortic arch. Mediastinum/Nodes: No enlarged mediastinal, hilar, or axillary lymph nodes. Thyroid gland, trachea, and esophagus demonstrate no significant findings. Lungs/Pleura: Scattered areas of linear consolidation at the lung bases likely reflect atelectasis. No acute airspace disease, effusion, or pneumothorax. Central airways are patent. Upper Abdomen: No acute abnormality. Musculoskeletal: No acute or destructive bony lesions. Stable postsurgical changes right breast. Reconstructed images demonstrate no additional findings. Review of the MIP images confirms the above findings. IMPRESSION: 1. No evidence of pulmonary embolus. 2. Stable pulmonary arterial hypertension. 3. Aortic Atherosclerosis (ICD10-I70.0). Coronary artery atherosclerosis. Electronically Signed   By: Randa Ngo M.D.   On: 08/17/2020 15:40   DG Chest Port 1 View  Result Date: 08/17/2020 CLINICAL DATA:  Hypoxia EXAM: PORTABLE CHEST 1 VIEW COMPARISON:  02/12/2019 FINDINGS: Low volume AP portable examination. Mild cardiomegaly. Mild  diffuse interstitial opacity most conspicuous at the lung bases. The visualized skeletal structures are unremarkable. IMPRESSION: Mild cardiomegaly with mild diffuse interstitial opacity most conspicuous at the lung bases, likely mild edema or atypical/viral infection. No focal airspace opacity. Electronically Signed   By: Eddie Candle M.D.   On: 08/17/2020 11:56   ECHOCARDIOGRAM COMPLETE  Result Date: 08/18/2020    ECHOCARDIOGRAM REPORT   Patient Name:   [name] Date of Exam: 08/18/2020 Medical Rec #:  [id number]     Height:       64.0 in Accession #:    [id number]    Weight:       159.4 lb Date of Birth:  1941/09/04      BSA:          1.777 m Patient Age:    79 years  BP:           107/53 mmHg Patient Gender: F             HR:           65 bpm. Exam Location:  Inpatient Procedure: 2D Echo Indications:    NSTEMI I21.4  History:        Patient has prior history of Echocardiogram examinations, most                 recent 06/29/2018. Risk Factors:Hypertension and Dyslipidemia.  Sonographer:    Mikki Santee RDCS (AE) Referring Phys: [id number] Lequita Halt IMPRESSIONS  1. Left ventricular ejection fraction, by estimation, is 60 to 65%. The left ventricle has normal function. The left ventricle has no regional wall motion abnormalities. There is mild concentric left ventricular hypertrophy and moderate basal septal hypertrophy.  2. Right ventricular systolic function is normal. The right ventricular size is normal. There is normal pulmonary artery systolic pressure. The estimated right ventricular systolic pressure is 29.9 mmHg.  3. The mitral valve is normal in structure. Trivial mitral valve regurgitation. No evidence of mitral stenosis.  4. The aortic valve is tricuspid. Aortic valve regurgitation is trivial. Mild aortic valve sclerosis is present, with no evidence of aortic valve stenosis.  5. The inferior vena cava is normal in size with greater than 50% respiratory variability, suggesting right atrial  pressure of 3 mmHg.  6. The pericardial effusion is anterior to the right ventricle.  7. Recommend limited study with definity contrast. FINDINGS  Left Ventricle: Left ventricular ejection fraction, by estimation, is 65 to 70%. The left ventricle has normal function. Left ventricular endocardial border not optimally defined to evaluate regional wall motion. The left ventricular internal cavity size was normal in size. There is mild concentric left ventricular hypertrophy. Left ventricular diastolic parameters are consistent with Grade I diastolic dysfunction (impaired relaxation). Elevated left ventricular end-diastolic pressure. Right Ventricle: The right ventricular size is normal. No increase in right ventricular wall thickness. Right ventricular systolic function is normal. There is normal pulmonary artery systolic pressure. The tricuspid regurgitant velocity is 2.01 m/s, and  with an assumed right atrial pressure of 3 mmHg, the estimated right ventricular systolic pressure is 37.1 mmHg. Left Atrium: Left atrial size was normal in size. Right Atrium: Right atrial size was normal in size. Pericardium: Trivial pericardial effusion is present. The pericardial effusion is anterior to the right ventricle. Mitral Valve: The mitral valve is normal in structure. Mild mitral annular calcification. Trivial mitral valve regurgitation. No evidence of mitral valve stenosis. Tricuspid Valve: The tricuspid valve is normal in structure. Tricuspid valve regurgitation is trivial. No evidence of tricuspid stenosis. Aortic Valve: The aortic valve is tricuspid. Aortic valve regurgitation is trivial. Mild aortic valve sclerosis is present, with no evidence of aortic valve stenosis. Pulmonic Valve: The pulmonic valve was normal in structure. Pulmonic valve regurgitation is not visualized. No evidence of pulmonic stenosis. Aorta: The aortic root is normal in size and structure. Venous: The inferior vena cava is normal in size with  greater than 50% respiratory variability, suggesting right atrial pressure of 3 mmHg. IAS/Shunts: No atrial level shunt detected by color flow Doppler.  LEFT VENTRICLE PLAX 2D LVIDd:         4.60 cm  Diastology LVIDs:         2.70 cm  LV e' medial:    3.97 cm/s LV PW:         1.30 cm  LV E/e' medial:  19.7 LV IVS:        1.50 cm  LV e' lateral:   5.19 cm/s LVOT diam:     2.00 cm  LV E/e' lateral: 15.0 LV SV:         117 LV SV Index:   66 LVOT Area:     3.14 cm  RIGHT VENTRICLE RV S prime:     10.90 cm/s TAPSE (M-mode): 2.0 cm LEFT ATRIUM             Index       RIGHT ATRIUM           Index LA diam:        4.00 cm 2.25 cm/m  RA Area:     11.00 cm LA Vol (A2C):   46.5 ml 26.17 ml/m RA Volume:   20.30 ml  11.43 ml/m LA Vol (A4C):   68.3 ml 38.45 ml/m LA Biplane Vol: 59.6 ml 33.55 ml/m  AORTIC VALVE LVOT Vmax:   159.00 cm/s LVOT Vmean:  122.000 cm/s LVOT VTI:    0.371 m  AORTA Ao Root diam: 3.10 cm MITRAL VALVE                TRICUSPID VALVE MV Area (PHT): 1.89 cm     TR Peak grad:   16.2 mmHg MV Decel Time: 401 msec     TR Vmax:        201.00 cm/s MV E velocity: 78.10 cm/s MV A velocity: 125.00 cm/s  SHUNTS MV E/A ratio:  0.62         Systemic VTI:  0.37 m                             Systemic Diam: 2.00 cm Fransico Him MD Electronically signed by Fransico Him MD Signature Date/Time: 08/18/2020/9:36:11 AM    Final     Cardiac Studies   2D echo 08/18/2020 IMPRESSIONS    1. Left ventricular ejection fraction, by estimation, is 60 to 65%. The  left ventricle has normal function. The left ventricle has no regional  wall motion abnormalities. There is mild concentric left ventricular  hypertrophy and moderate basal septal  hypertrophy.  2. Right ventricular systolic function is normal. The right ventricular  size is normal. There is normal pulmonary artery systolic pressure. The  estimated right ventricular systolic pressure is 59.1 mmHg.  3. The mitral valve is normal in structure. Trivial mitral  valve  regurgitation. No evidence of mitral stenosis.  4. The aortic valve is tricuspid. Aortic valve regurgitation is trivial.  Mild aortic valve sclerosis is present, with no evidence of aortic valve  stenosis.  5. The inferior vena cava is normal in size with greater than 50%  respiratory variability, suggesting right atrial pressure of 3 mmHg.  6. The pericardial effusion is anterior to the right ventricle.  7. Recommend limited study with definity contrast.   Patient Profile     79 y.o. female with a hx of schizoaffective disorder, bipolar disorder, breast cancer, hypertension, hyperlipidemia, previous right bundle branch block who is being seen  for the evaluation of elevated troponin at the request of Wynetta Fines, MD.  [place]    1.  Elevated troponin -hsTrop elevated ([id number]) -she has not had any chest pain -EKG shows chronic RBBB with no change in ST changes from 2019 -2D echo showed normal LVF although poor visualization of the endocardium in  the 2 and 3 chamber views -I have called for a limited repeat echo with definity -Suspect that her trop elevation is due to demand ischemia in the setting of fever and possible sepsis>> she has not had any chest pain -she does have coronary artery calcifications on Chest CTA so recommend outpt ischemic workup with Lexiscan myoview if definity contrast study shows no RWMAs.  2.  Coronary artery calcifications on Chest CT -Chest CTA done to rule out PE -No PE on Chest CT but coronary artery calcifications noted especially in the LAD -recommend outpt workup  3.  HTN -BP controlled -BP soft likely related to SIRS  4.  SIRS -unclear source -was hypotensive last night but BP improved today -per PCP  I have spent a total of 35 minutes with patient reviewing 2D echo , telemetry, EKGs, labs and examining patient as well as establishing an assessment and plan that was discussed with the patient.  > 50% of time was  spent in direct patient care.       For questions or updates, please contact [place] Please consult [email or web address] for contact info under Cardiology/STEMI.      Signed, Fransico Him, MD  08/18/2020, 9:43 AM

## 2020-08-18 NOTE — Progress Notes (Signed)
  Echocardiogram 2D Echocardiogram has been performed.  Johny Chess 08/18/2020, 11:10 AM

## 2020-08-18 NOTE — Progress Notes (Signed)
PROGRESS NOTE    [name]  QAS:[id number] DOB: [birth year] DOA: 08/17/2020 PCP: [name], MD   Brief Narrative:  [name] is a 79 y.o. female with medical history significant of anxiety, schizoaffective disorder, diastolic CHF, bipolar disorder, hypothyroidism, chronic ambulation dysfunction baseline use of walker, presented with fall and fever.  Patient was at her usual status of health until early this morning, she was trying to get out of bed but fell down, she felt generalized weakness, denied any loss of consciousness or prodromes of lightheaded or blurred vision.  Unable to get up from the floor, she started to have whole body shaking, she did not specify as " chills".  She further denied any short of breath, no cough, no chest or abdominal pain, no muscle aching, dysuria no diarrhea.  No headache or neck pain.  She denies any changes to her medications, she has been on the diet with low carbohydrate for 4 months and lost about 25 pounds. EMS arrived, found patient had a fever of 102, hypoxia 85% on room air, very confused. In ED: Patient was confused on arrival, O2 saturation improved soon after arrival without oxygen supplementation.  Troponin elevated 340>1100, EKG showed chronic RBBB.  WBC 13.2, creatinine 1.1. CT angiogram negative for PE, small bilateral lower field infiltrates.   Assessment & Plan:   Principal Problem:   Community acquired pneumonia of both lower lobes Active Problems:   Hypothyroidism   Sepsis ([place])   HLD (hyperlipidemia)   Schizo-affective schizophrenia ([place])   Chronic diastolic CHF (congestive heart failure) (HCC)   GERD (gastroesophageal reflux disease)   Bipolar 1 disorder (HCC)   NSTEMI (non-ST elevated myocardial infarction) (HCC)   Troponin level elevated   Acute febrile illness, rule out community-acquired pneumonia, viral versus bacterial, POA Patient does not meet sepsis criteria given leukocytosis, tachycardia and tachypnea at  admission - Personally reviewed chest x-ray and CT with bibasilar atelectasis as well as posterior atelectasis on CT concerning for pneumonia  - Transient episode of hypoxia resolved after initial oxygen supplementation - Given patient's mental status changes and encephalopathy as below certainly aspiration is a concern - Given sepsis criteria and transient hypoxia patient was started on azithromycin and ceftriaxone, will continue for 5-day course through 08/22/2020 - Patient's procalcitonin level within normal limits at admission, continue to follow - Less likely polypharmacy given no acute medication changes, patient indicates being very diligent with her home medications -UA unremarkable, patient declines any overt dysuria   Acute metabolic encephalopathy, transient, resolved - Resolved, CT head showed no acute intracranial findings. Suspect this might related to the brief episode of hypoxia and fever/infection as above -Again certainly polypharmacy could be playing a role in patient's mental status but again she denies any changes in medications or missed or extra doses  Elevated troponin in the setting of hypoxia, likely supply demand mismatch  - Without EKG change - Discussed with cardiology, whose initial opinion is this is from demanding ischemia from brief episode of hypoxia, not recommending ACS treatment. - Echocardiogram pending  Heart failure, diastolic, without acute exacerbation  - Could explain bibasilar and posterior atelectasis on imaging but would not explain fever or leukocytosis - Patient appears euvolemic if not mildly hypovolemic, encourage p.o. intake but hold off on IV fluids  Schizoaffective disorder -Continue Seroquel and Lamictal and Zyprexa  Hypothyroidism -Check TSH, continue Synthroid  HTN -Continue beta-blocker and Aldactone  Ambulatory dysfunction and mechanical fall -Neuro exam nonfocal, likely related to fever/altered mental status -  PT  evaluation  DVT prophylaxis: Lovenox Code Status: Full Family Communication: None present  Status is: Inpatient  Dispo: The patient is from: Home              Anticipated d/c is to: Home              Anticipated d/c date is: 48 to 72 hours              Patient currently not medically stable for discharge due to ongoing need for further work-up, IV antibiotics and close monitoring in the setting of mental status changes and sepsis criteria   Consultants:   None  Procedures:   None planned  Antimicrobials:  Azithromycin, ceftriaxone 08/18/2020-08/22/2020  Subjective: No acute issues or events overnight, patient feels much improved since admission, denies any dyspnea, headache, fever, chills, chest pain, shortness of breath, nausea, vomiting, diarrhea.  Objective: Vitals:   08/18/20 1030 08/18/20 1052 08/18/20 1455 08/18/20 1504  BP: (!) 127/46   (!) 142/55  Pulse: 75 74 76 79  Resp: (!) 22  19 (!) 22  Temp:    98.2 F (36.8 C)  TempSrc:    Oral  SpO2: 96%  94% 95%    Intake/Output Summary (Last 24 hours) at 08/18/2020 1650 Last data filed at 08/18/2020 1101 Gross per 24 hour  Intake 900 ml  Output 500 ml  Net 400 ml   There were no vitals filed for this visit.  Examination:  General:  Pleasantly resting in bed, No acute distress. HEENT:  Normocephalic atraumatic.  Sclerae nonicteric, noninjected.  Extraocular movements intact bilaterally. Neck:  Without mass or deformity.  Trachea is midline. Lungs: Bibasilar rales with posterior rhonchi bilaterally otherwise without overt wheeze Heart:  Regular rate and rhythm.  Without murmurs, rubs, or gallops. Abdomen:  Soft, nontender, nondistended.  Without guarding or rebound. Extremities: Without cyanosis, clubbing, edema, or obvious deformity. Vascular:  Dorsalis pedis and posterior tibial pulses palpable bilaterally. Skin:  Warm and dry, no erythema, no ulcerations.   Data Reviewed: I have personally reviewed  following labs and imaging studies  CBC: Recent Labs  Lab 08/17/20 1135 08/17/20 1148  WBC 13.2*  --   NEUTROABS 11.2*  --   HGB 14.0 13.9  HCT 42.9 41.0  MCV 96.4  --   PLT 224  --    Basic Metabolic Panel: Recent Labs  Lab 08/17/20 1135 08/17/20 1148  NA 141 142  K 3.7 3.5  CL 106  --   CO2 22  --   GLUCOSE 124*  --   BUN 15  --   CREATININE 1.10*  --   CALCIUM 10.4*  --   MG 1.9  --   PHOS 2.5  --    GFR: CrCl cannot be calculated (Unknown ideal weight.). Liver Function Tests: Recent Labs  Lab 08/17/20 1135  AST 29  ALT 34  ALKPHOS 41  BILITOT 0.9  PROT 7.2  ALBUMIN 4.0   No results for input(s): LIPASE, AMYLASE in the last 168 hours. Recent Labs  Lab 08/17/20 1135  AMMONIA 38*   Coagulation Profile: No results for input(s): INR, PROTIME in the last 168 hours. Cardiac Enzymes: Recent Labs  Lab 08/17/20 1520  CKTOTAL 231   BNP (last 3 results) No results for input(s): PROBNP in the last 8760 hours. HbA1C: No results for input(s): HGBA1C in the last 72 hours. CBG: Recent Labs  Lab 08/17/20 1109  GLUCAP 132*   Lipid Profile: Recent Labs  08/17/20 1135  TRIG 138   Thyroid Function Tests: Recent Labs    08/17/20 1701 08/18/20 0113  TSH <0.010*  --   FREET4  --  1.04   Anemia Panel: Recent Labs    08/17/20 1135  FERRITIN 97   Sepsis Labs: Recent Labs  Lab 08/17/20 1135 08/17/20 1520  PROCALCITON <0.10  --   LATICACIDVEN 1.6 1.1    Recent Results (from the past 240 hour(s))  SARS Coronavirus 2 by RT PCR (hospital order, performed in [facility] hospital lab) Nasopharyngeal Nasopharyngeal Swab     Status: None   Collection Time: 08/17/20 12:26 PM   Specimen: Nasopharyngeal Swab  Result Value Ref Range Status   SARS Coronavirus 2 NEGATIVE NEGATIVE Final    Comment: (NOTE) SARS-CoV-2 target nucleic acids are NOT DETECTED.  The SARS-CoV-2 RNA is generally detectable in upper and lower respiratory specimens during the  acute phase of infection. The lowest concentration of SARS-CoV-2 viral copies this assay can detect is 250 copies / mL. A negative result does not preclude SARS-CoV-2 infection and should not be used as the sole basis for treatment or other patient management decisions.  A negative result may occur with improper specimen collection / handling, submission of specimen other than nasopharyngeal swab, presence of viral mutation(s) within the areas targeted by this assay, and inadequate number of viral copies (<250 copies / mL). A negative result must be combined with clinical observations, patient history, and epidemiological information.  Fact Sheet for Patients:   StrictlyIdeas.no  Fact Sheet for Healthcare Providers: BankingDealers.co.za  This test is not yet approved or  cleared by the [place] FDA and has been authorized for detection and/or diagnosis of SARS-CoV-2 by FDA under an Emergency Use Authorization (EUA).  This EUA will remain in effect (meaning this test can be used) for the duration of the COVID-19 declaration under Section 564(b)(1) of the Act, 21 U.S.C. section 360bbb-3(b)(1), unless the authorization is terminated or revoked sooner.  Performed at [facility], [place] [address], [place], [place] [address]   Blood Culture (routine x 2)     Status: None (Preliminary result)   Collection Time: 08/17/20 12:51 PM   Specimen: BLOOD  Result Value Ref Range Status   Specimen Description BLOOD LEFT ANTECUBITAL  Final   Special Requests   Final    BOTTLES DRAWN AEROBIC AND ANAEROBIC Blood Culture results may not be optimal due to an inadequate volume of blood received in culture bottles   Culture   Final    NO GROWTH 1 DAY Performed at [facility], [place] [address] [place], [place] [address]    Report Status PENDING  Incomplete  Blood Culture (routine x 2)     Status: None (Preliminary result)   Collection  Time: 08/17/20 12:54 PM   Specimen: BLOOD  Result Value Ref Range Status   Specimen Description BLOOD RIGHT ANTECUBITAL  Final   Special Requests   Final    BOTTLES DRAWN AEROBIC AND ANAEROBIC Blood Culture results may not be optimal due to an inadequate volume of blood received in culture bottles   Culture   Final    NO GROWTH 1 DAY Performed at [facility], [place] [address], [place], [place] [address]    Report Status PENDING  Incomplete         Radiology Studies: CT Head Wo Contrast  Result Date: 08/17/2020 CLINICAL DATA:  Head trauma.  Unwitnessed fall. EXAM: CT HEAD WITHOUT CONTRAST  TECHNIQUE: Contiguous axial images were obtained from the base of the skull through the vertex without intravenous contrast. COMPARISON:  Head CT 08/28/2013 FINDINGS: Brain: Generalized atrophy with mild chronic small vessel ischemia. No intracranial hemorrhage, mass effect, or midline shift. No hydrocephalus. The basilar cisterns are patent. No evidence of territorial infarct or acute ischemia. No extra-axial or intracranial fluid collection. Vascular: Atherosclerosis of skullbase vasculature without hyperdense vessel or abnormal calcification. Skull: No fracture or focal lesion.  Calvarial hyperostosis. Sinuses/Orbits: No acute findings. Minor mucosal thickening of the ethmoid air cells. Bilateral cataract resection. Mastoid air cells are clear. Other: None. IMPRESSION: 1. No acute intracranial abnormality. No skull fracture. 2. Generalized atrophy and chronic small vessel ischemia. Electronically Signed   By: Keith Rake M.D.   On: 08/17/2020 15:39   CT Angio Chest PE W/Cm &/Or Wo Cm  Result Date: 08/17/2020 CLINICAL DATA:  Hypoxia EXAM: CT ANGIOGRAPHY CHEST WITH CONTRAST TECHNIQUE: Multidetector CT imaging of the chest was performed using the standard protocol during bolus administration of intravenous contrast. Multiplanar CT image reconstructions and MIPs were obtained to evaluate the vascular  anatomy. CONTRAST:  59mL OMNIPAQUE IOHEXOL 350 MG/ML SOLN COMPARISON:  08/17/2020, 06/28/2018 FINDINGS: Cardiovascular: This is a technically adequate evaluation of the pulmonary vasculature. No filling defects or pulmonary emboli. Main pulmonary artery is mildly dilated, consistent with pulmonary arterial hypertension. This is a stable finding. The heart is stable without pericardial effusion. Stable atherosclerosis of the coronary vasculature greatest in the LAD distribution. Mild atherosclerosis of the aortic arch. Mediastinum/Nodes: No enlarged mediastinal, hilar, or axillary lymph nodes. Thyroid gland, trachea, and esophagus demonstrate no significant findings. Lungs/Pleura: Scattered areas of linear consolidation at the lung bases likely reflect atelectasis. No acute airspace disease, effusion, or pneumothorax. Central airways are patent. Upper Abdomen: No acute abnormality. Musculoskeletal: No acute or destructive bony lesions. Stable postsurgical changes right breast. Reconstructed images demonstrate no additional findings. Review of the MIP images confirms the above findings. IMPRESSION: 1. No evidence of pulmonary embolus. 2. Stable pulmonary arterial hypertension. 3. Aortic Atherosclerosis (ICD10-I70.0). Coronary artery atherosclerosis. Electronically Signed   By: Randa Ngo M.D.   On: 08/17/2020 15:40   DG Chest Port 1 View  Result Date: 08/17/2020 CLINICAL DATA:  Hypoxia EXAM: PORTABLE CHEST 1 VIEW COMPARISON:  02/12/2019 FINDINGS: Low volume AP portable examination. Mild cardiomegaly. Mild diffuse interstitial opacity most conspicuous at the lung bases. The visualized skeletal structures are unremarkable. IMPRESSION: Mild cardiomegaly with mild diffuse interstitial opacity most conspicuous at the lung bases, likely mild edema or atypical/viral infection. No focal airspace opacity. Electronically Signed   By: Eddie Candle M.D.   On: 08/17/2020 11:56   ECHOCARDIOGRAM COMPLETE  Result Date:  08/18/2020    ECHOCARDIOGRAM REPORT   Patient Name:   [name] Date of Exam: 08/18/2020 Medical Rec #:  [id number]     Height:       64.0 in Accession #:    [id number]    Weight:       159.4 lb Date of Birth:  29-Jul-1941      BSA:          1.777 m Patient Age:    57 years      BP:           107/53 mmHg Patient Gender: F             HR:           65 bpm. Exam Location:  Inpatient Procedure: 2D Echo Indications:  NSTEMI I21.4  History:        Patient has prior history of Echocardiogram examinations, most                 recent 06/29/2018. Risk Factors:Hypertension and Dyslipidemia.  Sonographer:    Mikki Santee RDCS (AE) Referring Phys: [id number] Lequita Halt IMPRESSIONS  1. Left ventricular ejection fraction, by estimation, is 60 to 65%. The left ventricle has normal function. The left ventricle has no regional wall motion abnormalities. There is mild concentric left ventricular hypertrophy and moderate basal septal hypertrophy.  2. Right ventricular systolic function is normal. The right ventricular size is normal. There is normal pulmonary artery systolic pressure. The estimated right ventricular systolic pressure is 46.9 mmHg.  3. The mitral valve is normal in structure. Trivial mitral valve regurgitation. No evidence of mitral stenosis.  4. The aortic valve is tricuspid. Aortic valve regurgitation is trivial. Mild aortic valve sclerosis is present, with no evidence of aortic valve stenosis.  5. The inferior vena cava is normal in size with greater than 50% respiratory variability, suggesting right atrial pressure of 3 mmHg.  6. The pericardial effusion is anterior to the right ventricle.  7. Recommend limited study with definity contrast. FINDINGS  Left Ventricle: Left ventricular ejection fraction, by estimation, is 65 to 70%. The left ventricle has normal function. Left ventricular endocardial border not optimally defined to evaluate regional wall motion. The left ventricular internal cavity size was  normal in size. There is mild concentric left ventricular hypertrophy. Left ventricular diastolic parameters are consistent with Grade I diastolic dysfunction (impaired relaxation). Elevated left ventricular end-diastolic pressure. Right Ventricle: The right ventricular size is normal. No increase in right ventricular wall thickness. Right ventricular systolic function is normal. There is normal pulmonary artery systolic pressure. The tricuspid regurgitant velocity is 2.01 m/s, and  with an assumed right atrial pressure of 3 mmHg, the estimated right ventricular systolic pressure is 62.9 mmHg. Left Atrium: Left atrial size was normal in size. Right Atrium: Right atrial size was normal in size. Pericardium: Trivial pericardial effusion is present. The pericardial effusion is anterior to the right ventricle. Mitral Valve: The mitral valve is normal in structure. Mild mitral annular calcification. Trivial mitral valve regurgitation. No evidence of mitral valve stenosis. Tricuspid Valve: The tricuspid valve is normal in structure. Tricuspid valve regurgitation is trivial. No evidence of tricuspid stenosis. Aortic Valve: The aortic valve is tricuspid. Aortic valve regurgitation is trivial. Mild aortic valve sclerosis is present, with no evidence of aortic valve stenosis. Pulmonic Valve: The pulmonic valve was normal in structure. Pulmonic valve regurgitation is not visualized. No evidence of pulmonic stenosis. Aorta: The aortic root is normal in size and structure. Venous: The inferior vena cava is normal in size with greater than 50% respiratory variability, suggesting right atrial pressure of 3 mmHg. IAS/Shunts: No atrial level shunt detected by color flow Doppler.  LEFT VENTRICLE PLAX 2D LVIDd:         4.60 cm  Diastology LVIDs:         2.70 cm  LV e' medial:    3.97 cm/s LV PW:         1.30 cm  LV E/e' medial:  19.7 LV IVS:        1.50 cm  LV e' lateral:   5.19 cm/s LVOT diam:     2.00 cm  LV E/e' lateral: 15.0 LV  SV:         117 LV SV Index:  66 LVOT Area:     3.14 cm  RIGHT VENTRICLE RV S prime:     10.90 cm/s TAPSE (M-mode): 2.0 cm LEFT ATRIUM             Index       RIGHT ATRIUM           Index LA diam:        4.00 cm 2.25 cm/m  RA Area:     11.00 cm LA Vol (A2C):   46.5 ml 26.17 ml/m RA Volume:   20.30 ml  11.43 ml/m LA Vol (A4C):   68.3 ml 38.45 ml/m LA Biplane Vol: 59.6 ml 33.55 ml/m  AORTIC VALVE LVOT Vmax:   159.00 cm/s LVOT Vmean:  122.000 cm/s LVOT VTI:    0.371 m  AORTA Ao Root diam: 3.10 cm MITRAL VALVE                TRICUSPID VALVE MV Area (PHT): 1.89 cm     TR Peak grad:   16.2 mmHg MV Decel Time: 401 msec     TR Vmax:        201.00 cm/s MV E velocity: 78.10 cm/s MV A velocity: 125.00 cm/s  SHUNTS MV E/A ratio:  0.62         Systemic VTI:  0.37 m                             Systemic Diam: 2.00 cm Fransico Him MD Electronically signed by Fransico Him MD Signature Date/Time: 08/18/2020/9:36:11 AM    Final    ECHOCARDIOGRAM LIMITED  Result Date: 08/18/2020    ECHOCARDIOGRAM LIMITED REPORT   Patient Name:   [name] Date of Exam: 08/18/2020 Medical Rec #:  [id number]     Height:       64.0 in Accession #:    [id number]    Weight:       159.4 lb Date of Birth:  1941/11/13      BSA:          1.777 m Patient Age:    25 years      BP:           127/46 mmHg Patient Gender: F             HR:           76 bpm. Exam Location:  Inpatient Procedure: Limited Color Doppler and Intracardiac Opacification Agent Indications:    elevated troponin  History:        Patient has prior history of Echocardiogram examinations.  Sonographer:    Johny Chess Referring Phys: [place]  1. Left ventricular ejection fraction, by estimation, is 70 to 75%. The left ventricle has hyperdynamic function. The left ventricle has no regional wall motion abnormalities.  2. Right ventricular systolic function is normal. The right ventricular size is mildly enlarged.  3. Limited study with use of Definity  echocontrast, LVEF is hyperdynamic. There is significant eccentric LVH predominantly in the apical segments. A cardiac MRI is recommended to evaluate for apical form of hypertrophic cardiomyopathy. FINDINGS  Left Ventricle: Left ventricular ejection fraction, by estimation, is 70 to 75%. The left ventricle has hyperdynamic function. The left ventricle has no regional wall motion abnormalities. Definity contrast agent was given IV to delineate the left ventricular endocardial borders. The left ventricular internal cavity size was normal in size. There is no left ventricular hypertrophy.  Right Ventricle: The right ventricular size is mildly enlarged. No increase in right ventricular wall thickness. Right ventricular systolic function is normal.  Ena Dawley MD Electronically signed by Ena Dawley MD Signature Date/Time: 08/18/2020/12:54:13 PM    Final         Scheduled Meds: . aspirin EC  81 mg Oral Daily  . calcium-vitamin D  1 tablet Oral Daily  . cholecalciferol  1,000 Units Oral Daily  . donepezil  5 mg Oral Daily  . enoxaparin (LOVENOX) injection  40 mg Subcutaneous Q24H  . gabapentin  200 mg Oral QHS  . lamoTRIgine  150 mg Oral QPM  . levothyroxine  150 mcg Oral Daily  . metoprolol tartrate  25 mg Oral BID  . multivitamin with minerals  1 tablet Oral Daily  . OLANZapine  30 mg Oral QHS  . omega-3 acid ethyl esters  1 g Oral Daily  . pantoprazole  40 mg Oral Daily  . QUEtiapine  50 mg Oral QHS  . spironolactone  25 mg Oral Daily  . vitamin B-12  100 mcg Oral Daily   Continuous Infusions: . sodium chloride Stopped (08/18/20 1101)  . azithromycin Stopped (08/18/20 1222)  . cefTRIAXone (ROCEPHIN)  IV Stopped (08/18/20 6553)     LOS: 1 day   Time spent: 12min  Deavin Forst C Kimbella Heisler, DO [facility]  If 7PM-7AM, please contact night-coverage [email or web address]  08/18/2020, 4:50 PM

## 2020-08-18 NOTE — Significant Event (Addendum)
HOSPITAL MEDICINE OVERNIGHT EVENT NOTE    Notified by nursing that patient is becoming increasingly lethargic/minimally responsive with low blood pressure of 92/43.   Chart reviewed - patient admitted for fever.  Also noted to have leukocytosis.  Patient is not on antibiotics pending further workup.  I notice urinalysis is still not performed.  I have asked that an in and out cath be performed immediately to obtain this.  Ct chest reveals no evidence of infection, blood cultures pending.   Ordering 500 cc bolus for hypotension.  I also note that TSH is very low, suggestive of hyperthyroidism - will add on T3 and T4.    Troponin also noted to be rising (cardiology is aware according to their note) and in light of hypotension will get repeat now.    Vernelle Emerald  MD [facility]   ADDENDUM (9/11 1:30AM)  UA unremarkable.  BP's still low with maps below 65.  Will repeat 500cc bolus of LR.  Troponin still rising however, obtaining repeat ECG.  No chest pain.  Sherryll Burger Kinberly Perris   ADDENDUM (9/11 1:39AM)  Patient continues to be chest pain free, ECG showing no dynamic changes.  Cardiology eval by Dr. Stanford Breed feels that elevated troponin is not due to ACS and may be due to underlying infectious process?    Monitoring closely.   Sherryll Burger Taquanna Borras

## 2020-08-18 NOTE — ED Notes (Signed)
Pt straight cathed

## 2020-08-18 NOTE — Evaluation (Signed)
Physical Therapy Evaluation Patient Details Name: [name] MRN: [id number] DOB: 21-Jan-1941 Today's Date: 08/18/2020   History of Present Illness  The pt is an 79 yo female presenting from independent living at [place] [facility] after an unwitnessed fall. She was hypoxic on RA and disoriented. The tp is undergoing continued work up for elevated troponins. PMH includes: anxiety, schizoaffective disorder, diastolic CHF, bipolar disorder, and hypothyroidism.    Clinical Impression  Pt in bed upon arrival of PT, agreeable to evaluation at this time. Prior to admission the pt was mobilizing independently with rollator, attending exercise classes at [facility], and completing ADLs without supervision. The pt now presents with limitations in functional mobility, strength, dynamic stability, and activity tolerance due to above dx, and will continue to benefit from skilled PT to address these deficits. The pt was able to complete bed mobility with minimal assistance, but required BUE support and minA to complete OOB transfers and short bout of ambulation at the EOB at this time. The pt is visibly shaky, and reports significant fear of falling with mobility at this time. As the pt was previously in independent living, she may benefit from short-term SNF before returning to living independently if she cannot receive supervision in her current living situation. The pt will continue to benefit from skilled PT to progress functional strength and stability for transfers as well as dynamic stability and endurance for gait.      Follow Up Recommendations SNF;Supervision/Assistance - 24 hour (short term SNF unless pt can receive supervision at [facility])    Equipment Recommendations  None recommended by PT (pt has needed equipment)    Recommendations for Other Services       Precautions / Restrictions Precautions Precautions: Fall Precaution Comments: pt admitted following a fall Restrictions Weight Bearing  Restrictions: No      Mobility  Bed Mobility Overal bed mobility: Needs Assistance Bed Mobility: Supine to Sit;Sit to Supine     Supine to sit: Min assist Sit to supine: Supervision   General bed mobility comments: minA to come to sitting EOB without use of rail. pt able to return to high bed without assist and reposition  Transfers Overall transfer level: Needs assistance Equipment used: 1 person hand held assist Transfers: Sit to/from Stand Sit to Stand: Min assist         General transfer comment: no assist to power up, but minA to steady. pt usually used rollator, but no AD in room during eval. shaking even with bilateral HHA  Ambulation/Gait Ambulation/Gait assistance: Min assist Gait Distance (Feet): 5 Feet Assistive device: 1 person hand held assist Gait Pattern/deviations: Step-to pattern   Gait velocity interpretation: <1.31 ft/sec, indicative of household ambulator General Gait Details: shaky lateral steps with bilateral HHA. pt able to clear feet for steps, but reports significant concerns about falling and shakiness with mobility without her rollator.      Balance Overall balance assessment: History of Falls;Needs assistance Sitting-balance support: Single extremity supported;Feet supported Sitting balance-Leahy Scale: Fair     Standing balance support: Bilateral upper extremity supported;During functional activity Standing balance-Leahy Scale: Poor Standing balance comment: reliant on BUE support                             Pertinent Vitals/Pain Pain Assessment: No/denies pain    Home Living                   Additional Comments: Independent Living at  [facility]. receives PT there.    Prior Function Level of Independence: Independent with assistive device(s)         Comments: able to ambulate independently in apartment with rollator, attends exercise classes at [facility]. ADLs independently but has switched to sponge bath  for her comfort     Hand Dominance   Dominant Hand: Right    Extremity/Trunk Assessment   Upper Extremity Assessment Upper Extremity Assessment: Generalized weakness    Lower Extremity Assessment Lower Extremity Assessment: Generalized weakness    Cervical / Trunk Assessment Cervical / Trunk Assessment: Normal  Communication   Communication: No difficulties  Cognition Arousal/Alertness: Awake/alert Behavior During Therapy: Restless Overall Cognitive Status: No family/caregiver present to determine baseline cognitive functioning                                 General Comments: pt now oriented to situation, but reports she has never had issues with O2 (SpO2 was 85% when pt found by EMS, and she was placed on 3L O2). Her memory seems intact as she was able to detail living situation and assist levels      General Comments General comments (skin integrity, edema, etc.): HR in 80s, SpO2 >90% with mobility in the room on RA        Assessment/Plan    PT Assessment Patient needs continued PT services  PT Problem List Decreased strength;Decreased safety awareness;Decreased balance;Decreased mobility       PT Treatment Interventions Balance training;Gait training;Functional mobility training;Patient/family education;Therapeutic activities;Therapeutic exercise    PT Goals (Current goals can be found in the Care Plan section)  Acute Rehab PT Goals Patient Stated Goal: to go to the bathroom PT Goal Formulation: With patient Time For Goal Achievement: 09/01/20 Potential to Achieve Goals: Good    Frequency Min 3X/week   Barriers to discharge Decreased caregiver support pt from independent living, would benefit from supervision       AM-PAC PT "6 Clicks" Mobility  Outcome Measure Help needed turning from your back to your side while in a flat bed without using bedrails?: None Help needed moving from lying on your back to sitting on the side of a flat bed  without using bedrails?: None Help needed moving to and from a bed to a chair (including a wheelchair)?: A Little Help needed standing up from a chair using your arms (e.g., wheelchair or bedside chair)?: A Little Help needed to walk in hospital room?: A Little Help needed climbing 3-5 steps with a railing? : A Lot 6 Click Score: 19    End of Session Equipment Utilized During Treatment: Gait belt Activity Tolerance: Patient tolerated treatment well Patient left: in bed;with call bell/phone within reach Nurse Communication: Mobility status (pt need to use the restroom) PT Visit Diagnosis: Muscle weakness (generalized) (M62.81);History of falling (Z91.81);Difficulty in walking, not elsewhere classified (R26.2)    Time: 0093-8182 PT Time Calculation (min) (ACUTE ONLY): 21 min   Charges:   PT Evaluation $PT Eval Moderate Complexity: 1 Mod          Karma Ganja, PT, DPT   Acute Rehabilitation Department Pager #: [phone]  Otho Bellows 08/18/2020, 2:01 PM

## 2020-08-18 NOTE — Progress Notes (Signed)
2D echo reviewed and repeat with definity contrast reviewed.  Normal LVF with EF 65-70% with no wall motion abnormalities.  Elevated hsTrop elevation likely related to demand ischemia in the setting of fever and possible sepsis.  No further in pt workup.  Given coronary Ca on Chest CT, recommend outpt Lexiscan myoview which we will arrange.    Will sign off.  We will set up followup in office after stress test with PA

## 2020-08-19 LAB — PROCALCITONIN: Procalcitonin: <0.1 ng/mL

## 2020-08-19 LAB — CBC
HCT: 37.8 % (ref 36.0–46.0)
Hemoglobin: 12.6 g/dL (ref 12.0–15.0)
MCH: 32.6 pg (ref 26.0–34.0)
MCHC: 33.3 g/dL (ref 30.0–36.0)
MCV: 97.9 fL (ref 80.0–100.0)
Platelets: 247 10*3/uL (ref 150–400)
RBC: 3.86 MIL/uL — ABNORMAL LOW (ref 3.87–5.11)
RDW: 13.8 % (ref 11.5–15.5)
WBC: 4.7 10*3/uL (ref 4.0–10.5)
nRBC: 0 % (ref 0.0–0.2)

## 2020-08-19 LAB — COMPREHENSIVE METABOLIC PANEL
ALT: 25 U/L (ref 0–44)
AST: 26 U/L (ref 15–41)
Albumin: 3 g/dL — ABNORMAL LOW (ref 3.5–5.0)
Alkaline Phosphatase: 31 U/L — ABNORMAL LOW (ref 38–126)
Anion gap: 12 (ref 5–15)
BUN: 8 mg/dL (ref 8–23)
CO2: 21 mmol/L — ABNORMAL LOW (ref 22–32)
Calcium: 9.2 mg/dL (ref 8.9–10.3)
Chloride: 109 mmol/L (ref 98–111)
Creatinine, Ser: 0.72 mg/dL (ref 0.44–1.00)
GFR calc Af Amer: >60 mL/min (ref >60)
GFR calc non Af Amer: >60 mL/min (ref >60)
Glucose, Bld: 108 mg/dL — ABNORMAL HIGH (ref 70–99)
Potassium: 4 mmol/L (ref 3.5–5.1)
Sodium: 142 mmol/L (ref 135–145)
Total Bilirubin: 1 mg/dL (ref 0.3–1.2)
Total Protein: 5.7 g/dL — ABNORMAL LOW (ref 6.5–8.1)

## 2020-08-19 LAB — URINE CULTURE: Culture: NO GROWTH

## 2020-08-19 LAB — C-REACTIVE PROTEIN: CRP: 3.5 mg/dL — ABNORMAL HIGH (ref <1.0)

## 2020-08-19 LAB — T3: T3, Total: 86 ng/dL (ref 71–180)

## 2020-08-19 NOTE — Evaluation (Signed)
Clinical/Bedside Swallow Evaluation Patient Details  Name: [name] MRN: [id number] Date of Birth: Nov 04, 1941  Today's Date: 08/19/2020 Time: SLP Start Time (ACUTE ONLY): 1034 SLP Stop Time (ACUTE ONLY): 1046 SLP Time Calculation (min) (ACUTE ONLY): 12 min  Past Medical History:  Past Medical History:  Diagnosis Date  . Anxiety   . Arrhythmia    right bundle branch block  . Bipolar 1 disorder ([place])   . Cancer ([place])   . Hyperlipidemia   . Hypertension   . Morbid obesity ([place])   . Osteoarthritis   . Schizo-affective schizophrenia ([place])   . Thyroid disease    hypothyroidism   Past Surgical History:  Past Surgical History:  Procedure Laterality Date  . BREAST SURGERY    . CHOLECYSTECTOMY    . DILATION AND CURETTAGE OF UTERUS    . ESOPHAGEAL MANOMETRY N/A 01/29/2018   Procedure: ESOPHAGEAL MANOMETRY (EM);  Surgeon: Wonda Horner, MD;  Location: [facility];  Service: Endoscopy;  Laterality: N/A;  . HAND SURGERY     Right-trigger finger  . IR THORACENTESIS ASP PLEURAL SPACE W/IMG GUIDE  06/28/2018  . IR THORACENTESIS ASP PLEURAL SPACE W/IMG GUIDE  07/19/2018  . KNEE SURGERY     Left  . TONSILLECTOMY     HPI:  79 yo female presenting from independent living at [place] after an unwitnessed fall. Found to have suspicion for pneumnia, and concern of aspiration d/t echcephalopathy, elevated troponin. CXR Mild cardiomegaly with mild diffuse interstitial opacity most conspicuous at the lung bases, likely mild edema or atypical/viral infection. 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 MBS 12/09/17 backflow of barium with significant aspiration, senses, coughs and ejects aspirates after puree with severe stasis. Reg/thin recommended. She underwent esophageal manometry 01/29/18.    Assessment / Plan / Recommendation Clinical Impression  Pt's oral and phryngeal swallow during assessment deemed  normal via this subjective evaluation. She has history of severe esophageal stasis with food/liquid based on MBS results 2019 and underwent esophageal manometry and does not report any significant difficulties recently. She did have an episode of globus sensation with corn and coughed expectorated piece corn. Reviewed GER/esophageal precautions which she is following. Recommend continued regular diet and educated re: textures to use additional caution with. Thin liquids recommended. Observed consuming large pill with water (with RN) x 2 then took 2 at a time with water without s/s aspiraiton.  Discussed possibility of consuming larger pills with puree if has problems in the future. Also educated re: possibility of aspiration post prandial especially with hx of esophageal stasis. No further follow up needed.  SLP Visit Diagnosis: Dysphagia, unspecified (R13.10)    Aspiration Risk  Mild aspiration risk    Diet Recommendation Regular;Thin liquid   Liquid Administration via: Cup;Straw Medication Administration: Whole meds with liquid Supervision: Patient able to self feed Compensations: Slow rate;Small sips/bites Postural Changes: Seated upright at 90 degrees;Remain upright for at least 30 minutes after po intake    Other  Recommendations Oral Care Recommendations: Oral care BID   Follow up Recommendations None      Frequency and Duration            Prognosis        Swallow Study   General HPI: 79 yo female presenting from independent living at [facility] after an unwitnessed fall. Found to have suspicion for pneumnia, and concern of aspiration d/t echcephalopathy, elevated troponin. CXR Mild  cardiomegaly with mild diffuse interstitial opacity most conspicuous at the lung bases, likely mild edema or atypical/viral infection. 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 MBS 12/09/17 backflow of barium with  significant aspiration, senses, coughs and ejects aspirates after puree with severe stasis. Reg/thin recommended. She underwent esophageal manometry 01/29/18.  Type of Study: Bedside Swallow Evaluation Previous Swallow Assessment: see HPI  Diet Prior to this Study: Regular;Thin liquids Temperature Spikes Noted: No Respiratory Status: Room air History of Recent Intubation: No Behavior/Cognition: Alert;Cooperative;Pleasant mood Oral Cavity Assessment: Within Functional Limits Oral Care Completed by SLP: No Oral Cavity - Dentition: Adequate natural dentition Vision: Functional for self-feeding Self-Feeding Abilities: Able to feed self Patient Positioning: Upright in bed Baseline Vocal Quality: Normal Volitional Cough: Strong Volitional Swallow: Able to elicit    Oral/Motor/Sensory Function Overall Oral Motor/Sensory Function: Within functional limits   Ice Chips Ice chips: Not tested   Thin Liquid Thin Liquid: Within functional limits Presentation: Straw    Nectar Thick Nectar Thick Liquid: Not tested   Honey Thick Honey Thick Liquid: Not tested   Puree Puree: Not tested   Solid     Solid: Within functional limits      Houston Siren 08/19/2020,11:09 AM    Orbie Pyo Colvin Caroli.Ed Risk analyst [phone] Office [phone]

## 2020-08-19 NOTE — Progress Notes (Signed)
PROGRESS NOTE    [name]  WYO:[id number] DOB: 1941-03-03 DOA: 08/17/2020 PCP: [name], MD   Brief Narrative:  [name] is a 79 y.o. female with medical history significant of anxiety, schizoaffective disorder, diastolic CHF, bipolar disorder, hypothyroidism, chronic ambulation dysfunction baseline use of walker, presented with fall and fever.  Patient was at her usual status of health until early this morning, she was trying to get out of bed but fell down, she felt generalized weakness, denied any loss of consciousness or prodromes of lightheaded or blurred vision.  Unable to get up from the floor, she started to have whole body shaking, she did not specify as " chills".  She further denied any short of breath, no cough, no chest or abdominal pain, no muscle aching, dysuria no diarrhea.  No headache or neck pain.  She denies any changes to her medications, she has been on the diet with low carbohydrate for 4 months and lost about 25 pounds. EMS arrived, found patient had a fever of 102, hypoxia 85% on room air, very confused. In ED: Patient was confused on arrival, O2 saturation improved soon after arrival without oxygen supplementation.  Troponin elevated 340>1100, EKG showed chronic RBBB.  WBC 13.2, creatinine 1.1. CT angiogram negative for PE, small bilateral lower field infiltrates.   Assessment & Plan:   Principal Problem:   Community acquired pneumonia of both lower lobes Active Problems:   Sepsis ([place])   Schizo-affective schizophrenia ([place])   Hypothyroidism   HLD (hyperlipidemia)   Chronic diastolic CHF (congestive heart failure) (HCC)   GERD (gastroesophageal reflux disease)   Bipolar 1 disorder (HCC)   NSTEMI (non-ST elevated myocardial infarction) (HCC)   Troponin level elevated    Acute febrile illness, rule out community-acquired pneumonia, viral versus bacterial, POA Patient does not meet sepsis criteria given leukocytosis, tachycardia and tachypnea at  admission - Personally reviewed chest x-ray and CT with bibasilar atelectasis as well as posterior atelectasis on CT concerning for possible underlying pneumonia  - Transient episode of hypoxia resolved after initial oxygen supplementation - Given sepsis criteria and transient hypoxia patient was started on azithromycin and ceftriaxone, will continue for 5-day course through 08/22/2020 - Procalcitonin remains negative thus far - Less likely polypharmacy given no acute medication changes, patient indicates being very diligent with her home medications - UA unremarkable, patient declines any overt dysuria   Acute metabolic encephalopathy, transient, resolved - Resolved, CT head showed no acute intracranial findings. Suspect this might related to the brief episode of hypoxia and fever/infection as above - Polypharmacy could be playing a role in patient's mental status but again she denies any changes in medications or missed or extra doses  Elevated troponin in the setting of hypoxia, likely supply demand mismatch  - Without EKG change - Discussed with cardiology, whose initial opinion is this is from demanding ischemia from brief episode of hypoxia, not recommending ACS treatment. - Echocardiogram pending  Heart failure, diastolic, without acute exacerbation  - Could explain bibasilar and posterior atelectasis on imaging but would not explain fever or leukocytosis - Patient appears euvolemic if not mildly hypovolemic, encourage p.o. intake but hold off on IV fluids  Schizoaffective disorder -Continue Seroquel and Lamictal and Zyprexa  Hypothyroidism - TSH below normal limits, T4 WNL; continue Synthroid  HTN -Continue beta-blocker and Aldactone  Ambulatory dysfunction and mechanical fall -Neuro exam nonfocal, likely related to fever/altered mental status -PT evaluation  DVT prophylaxis: Lovenox Code Status: Full Family Communication: None present  Status is:  Inpatient  Dispo: The patient is from: ALF              Anticipated d/c is to: SNF (opposite side of her ALF) pending              Anticipated d/c date is: 61 to 72 hours              Patient currently not medically stable for discharge due to ongoing need for further work-up, IV antibiotics and close monitoring in the setting of mental status changes and sepsis criteria   Consultants:   None  Procedures:   None planned  Antimicrobials:  Azithromycin, ceftriaxone 08/18/2020-08/22/2020  Subjective: No acute issues or events overnight, patient feels markedly improved, she states she feels back to baseline although is markedly weak compared to her usual ambulatory status.  She denies any nausea, vomiting, diarrhea, constipation, headache, fevers, chills.  Objective: Vitals:   08/18/20 1504 08/18/20 2220 08/19/20 0025 08/19/20 0427  BP: (!) 142/55 (!) 142/68 (!) 109/56 125/63  Pulse: 79 64 (!) 58 (!) 57  Resp: (!) 22 (!) 21 18 18   Temp: 98.2 F (36.8 C) 98.1 F (36.7 C) 98 F (36.7 C) 97.6 F (36.4 C)  TempSrc: Oral Oral Oral Oral  SpO2: 95% 95% 95% 96%    Intake/Output Summary (Last 24 hours) at 08/19/2020 0724 Last data filed at 08/19/2020 0647 Gross per 24 hour  Intake 1140 ml  Output 200 ml  Net 940 ml   There were no vitals filed for this visit.  Examination:  General:  Pleasantly resting in bed, No acute distress. HEENT:  Normocephalic atraumatic.  Sclerae nonicteric, noninjected.  Extraocular movements intact bilaterally. Neck:  Without mass or deformity.  Trachea is midline. Lungs: Bibasilar rales with posterior rhonchi bilaterally otherwise without overt wheeze Heart:  Regular rate and rhythm.  Without murmurs, rubs, or gallops. Abdomen:  Soft, nontender, nondistended.  Without guarding or rebound. Extremities: Without cyanosis, clubbing, edema, or obvious deformity. Vascular:  Dorsalis pedis and posterior tibial pulses palpable bilaterally. Skin:  Warm and  dry, no erythema, no ulcerations.   Data Reviewed: I have personally reviewed following labs and imaging studies  CBC: Recent Labs  Lab 08/17/20 1135 08/17/20 1148  WBC 13.2*  --   NEUTROABS 11.2*  --   HGB 14.0 13.9  HCT 42.9 41.0  MCV 96.4  --   PLT 224  --    Basic Metabolic Panel: Recent Labs  Lab 08/17/20 1135 08/17/20 1148  NA 141 142  K 3.7 3.5  CL 106  --   CO2 22  --   GLUCOSE 124*  --   BUN 15  --   CREATININE 1.10*  --   CALCIUM 10.4*  --   MG 1.9  --   PHOS 2.5  --    GFR: CrCl cannot be calculated (Unknown ideal weight.). Liver Function Tests: Recent Labs  Lab 08/17/20 1135  AST 29  ALT 34  ALKPHOS 41  BILITOT 0.9  PROT 7.2  ALBUMIN 4.0   No results for input(s): LIPASE, AMYLASE in the last 168 hours. Recent Labs  Lab 08/17/20 1135  AMMONIA 38*   Coagulation Profile: No results for input(s): INR, PROTIME in the last 168 hours. Cardiac Enzymes: Recent Labs  Lab 08/17/20 1520  CKTOTAL 231   BNP (last 3 results) No results for input(s): PROBNP in the last 8760 hours. HbA1C: No results for input(s): HGBA1C in the last 72 hours. CBG:  Recent Labs  Lab 08/17/20 1109  GLUCAP 132*   Lipid Profile: Recent Labs    08/17/20 1135  TRIG 138   Thyroid Function Tests: Recent Labs    08/17/20 1701 08/18/20 0113  TSH <0.010*  --   FREET4  --  1.04   Anemia Panel: Recent Labs    08/17/20 1135  FERRITIN 97   Sepsis Labs: Recent Labs  Lab 08/17/20 1135 08/17/20 1520  PROCALCITON <0.10  --   LATICACIDVEN 1.6 1.1    Recent Results (from the past 240 hour(s))  SARS Coronavirus 2 by RT PCR (hospital order, performed in [facility] hospital lab) Nasopharyngeal Nasopharyngeal Swab     Status: None   Collection Time: 08/17/20 12:26 PM   Specimen: Nasopharyngeal Swab  Result Value Ref Range Status   SARS Coronavirus 2 NEGATIVE NEGATIVE Final    Comment: (NOTE) SARS-CoV-2 target nucleic acids are NOT DETECTED.  The SARS-CoV-2  RNA is generally detectable in upper and lower respiratory specimens during the acute phase of infection. The lowest concentration of SARS-CoV-2 viral copies this assay can detect is 250 copies / mL. A negative result does not preclude SARS-CoV-2 infection and should not be used as the sole basis for treatment or other patient management decisions.  A negative result may occur with improper specimen collection / handling, submission of specimen other than nasopharyngeal swab, presence of viral mutation(s) within the areas targeted by this assay, and inadequate number of viral copies (<250 copies / mL). A negative result must be combined with clinical observations, patient history, and epidemiological information.  Fact Sheet for Patients:   StrictlyIdeas.no  Fact Sheet for Healthcare Providers: BankingDealers.co.za  This test is not yet approved or  cleared by the [place] FDA and has been authorized for detection and/or diagnosis of SARS-CoV-2 by FDA under an Emergency Use Authorization (EUA).  This EUA will remain in effect (meaning this test can be used) for the duration of the COVID-19 declaration under Section 564(b)(1) of the Act, 21 U.S.C. section 360bbb-3(b)(1), unless the authorization is terminated or revoked sooner.  Performed at [facility], [place] [address] [place], [place] [address]   Blood Culture (routine x 2)     Status: None (Preliminary result)   Collection Time: 08/17/20 12:51 PM   Specimen: BLOOD  Result Value Ref Range Status   Specimen Description BLOOD LEFT ANTECUBITAL  Final   Special Requests   Final    BOTTLES DRAWN AEROBIC AND ANAEROBIC Blood Culture results may not be optimal due to an inadequate volume of blood received in culture bottles   Culture   Final    NO GROWTH 1 DAY Performed at [facility], [place] [address], [place], [place] [address]    Report Status PENDING  Incomplete   Blood Culture (routine x 2)     Status: None (Preliminary result)   Collection Time: 08/17/20 12:54 PM   Specimen: BLOOD  Result Value Ref Range Status   Specimen Description BLOOD RIGHT ANTECUBITAL  Final   Special Requests   Final    BOTTLES DRAWN AEROBIC AND ANAEROBIC Blood Culture results may not be optimal due to an inadequate volume of blood received in culture bottles   Culture   Final    NO GROWTH 1 DAY Performed at [facility], [place] [address] [place], [place] [address]    Report Status PENDING  Incomplete         Radiology Studies: CT Head Wo  Contrast  Result Date: 08/17/2020 CLINICAL DATA:  Head trauma.  Unwitnessed fall. EXAM: CT HEAD WITHOUT CONTRAST TECHNIQUE: Contiguous axial images were obtained from the base of the skull through the vertex without intravenous contrast. COMPARISON:  Head CT 08/28/2013 FINDINGS: Brain: Generalized atrophy with mild chronic small vessel ischemia. No intracranial hemorrhage, mass effect, or midline shift. No hydrocephalus. The basilar cisterns are patent. No evidence of territorial infarct or acute ischemia. No extra-axial or intracranial fluid collection. Vascular: Atherosclerosis of skullbase vasculature without hyperdense vessel or abnormal calcification. Skull: No fracture or focal lesion.  Calvarial hyperostosis. Sinuses/Orbits: No acute findings. Minor mucosal thickening of the ethmoid air cells. Bilateral cataract resection. Mastoid air cells are clear. Other: None. IMPRESSION: 1. No acute intracranial abnormality. No skull fracture. 2. Generalized atrophy and chronic small vessel ischemia. Electronically Signed   By: Keith Rake M.D.   On: 08/17/2020 15:39   CT Angio Chest PE W/Cm &/Or Wo Cm  Result Date: 08/17/2020 CLINICAL DATA:  Hypoxia EXAM: CT ANGIOGRAPHY CHEST WITH CONTRAST TECHNIQUE: Multidetector CT imaging of the chest was performed using the standard protocol during bolus administration of intravenous contrast.  Multiplanar CT image reconstructions and MIPs were obtained to evaluate the vascular anatomy. CONTRAST:  73mL OMNIPAQUE IOHEXOL 350 MG/ML SOLN COMPARISON:  08/17/2020, 06/28/2018 FINDINGS: Cardiovascular: This is a technically adequate evaluation of the pulmonary vasculature. No filling defects or pulmonary emboli. Main pulmonary artery is mildly dilated, consistent with pulmonary arterial hypertension. This is a stable finding. The heart is stable without pericardial effusion. Stable atherosclerosis of the coronary vasculature greatest in the LAD distribution. Mild atherosclerosis of the aortic arch. Mediastinum/Nodes: No enlarged mediastinal, hilar, or axillary lymph nodes. Thyroid gland, trachea, and esophagus demonstrate no significant findings. Lungs/Pleura: Scattered areas of linear consolidation at the lung bases likely reflect atelectasis. No acute airspace disease, effusion, or pneumothorax. Central airways are patent. Upper Abdomen: No acute abnormality. Musculoskeletal: No acute or destructive bony lesions. Stable postsurgical changes right breast. Reconstructed images demonstrate no additional findings. Review of the MIP images confirms the above findings. IMPRESSION: 1. No evidence of pulmonary embolus. 2. Stable pulmonary arterial hypertension. 3. Aortic Atherosclerosis (ICD10-I70.0). Coronary artery atherosclerosis. Electronically Signed   By: Randa Ngo M.D.   On: 08/17/2020 15:40   DG Chest Port 1 View  Result Date: 08/17/2020 CLINICAL DATA:  Hypoxia EXAM: PORTABLE CHEST 1 VIEW COMPARISON:  02/12/2019 FINDINGS: Low volume AP portable examination. Mild cardiomegaly. Mild diffuse interstitial opacity most conspicuous at the lung bases. The visualized skeletal structures are unremarkable. IMPRESSION: Mild cardiomegaly with mild diffuse interstitial opacity most conspicuous at the lung bases, likely mild edema or atypical/viral infection. No focal airspace opacity. Electronically Signed   By:  Eddie Candle M.D.   On: 08/17/2020 11:56   ECHOCARDIOGRAM COMPLETE  Result Date: 08/18/2020    ECHOCARDIOGRAM REPORT   Patient Name:   STEPHEN TURNBAUGH Date of Exam: 08/18/2020 Medical Rec #:  [id number]     Height:       64.0 in Accession #:    [id number]    Weight:       159.4 lb Date of Birth:  06-07-1941      BSA:          1.777 m Patient Age:    29 years      BP:           107/53 mmHg Patient Gender: F             HR:  65 bpm. Exam Location:  Inpatient Procedure: 2D Echo Indications:    NSTEMI I21.4  History:        Patient has prior history of Echocardiogram examinations, most                 recent 06/29/2018. Risk Factors:Hypertension and Dyslipidemia.  Sonographer:    Mikki Santee RDCS (AE) Referring Phys: [id number] Lequita Halt IMPRESSIONS  1. Left ventricular ejection fraction, by estimation, is 60 to 65%. The left ventricle has normal function. The left ventricle has no regional wall motion abnormalities. There is mild concentric left ventricular hypertrophy and moderate basal septal hypertrophy.  2. Right ventricular systolic function is normal. The right ventricular size is normal. There is normal pulmonary artery systolic pressure. The estimated right ventricular systolic pressure is 06.2 mmHg.  3. The mitral valve is normal in structure. Trivial mitral valve regurgitation. No evidence of mitral stenosis.  4. The aortic valve is tricuspid. Aortic valve regurgitation is trivial. Mild aortic valve sclerosis is present, with no evidence of aortic valve stenosis.  5. The inferior vena cava is normal in size with greater than 50% respiratory variability, suggesting right atrial pressure of 3 mmHg.  6. The pericardial effusion is anterior to the right ventricle.  7. Recommend limited study with definity contrast. FINDINGS  Left Ventricle: Left ventricular ejection fraction, by estimation, is 65 to 70%. The left ventricle has normal function. Left ventricular endocardial border not optimally defined  to evaluate regional wall motion. The left ventricular internal cavity size was normal in size. There is mild concentric left ventricular hypertrophy. Left ventricular diastolic parameters are consistent with Grade I diastolic dysfunction (impaired relaxation). Elevated left ventricular end-diastolic pressure. Right Ventricle: The right ventricular size is normal. No increase in right ventricular wall thickness. Right ventricular systolic function is normal. There is normal pulmonary artery systolic pressure. The tricuspid regurgitant velocity is 2.01 m/s, and  with an assumed right atrial pressure of 3 mmHg, the estimated right ventricular systolic pressure is 37.6 mmHg. Left Atrium: Left atrial size was normal in size. Right Atrium: Right atrial size was normal in size. Pericardium: Trivial pericardial effusion is present. The pericardial effusion is anterior to the right ventricle. Mitral Valve: The mitral valve is normal in structure. Mild mitral annular calcification. Trivial mitral valve regurgitation. No evidence of mitral valve stenosis. Tricuspid Valve: The tricuspid valve is normal in structure. Tricuspid valve regurgitation is trivial. No evidence of tricuspid stenosis. Aortic Valve: The aortic valve is tricuspid. Aortic valve regurgitation is trivial. Mild aortic valve sclerosis is present, with no evidence of aortic valve stenosis. Pulmonic Valve: The pulmonic valve was normal in structure. Pulmonic valve regurgitation is not visualized. No evidence of pulmonic stenosis. Aorta: The aortic root is normal in size and structure. Venous: The inferior vena cava is normal in size with greater than 50% respiratory variability, suggesting right atrial pressure of 3 mmHg. IAS/Shunts: No atrial level shunt detected by color flow Doppler.  LEFT VENTRICLE PLAX 2D LVIDd:         4.60 cm  Diastology LVIDs:         2.70 cm  LV e' medial:    3.97 cm/s LV PW:         1.30 cm  LV E/e' medial:  19.7 LV IVS:        1.50 cm   LV e' lateral:   5.19 cm/s LVOT diam:     2.00 cm  LV E/e' lateral: 15.0 LV SV:  117 LV SV Index:   66 LVOT Area:     3.14 cm  RIGHT VENTRICLE RV S prime:     10.90 cm/s TAPSE (M-mode): 2.0 cm LEFT ATRIUM             Index       RIGHT ATRIUM           Index LA diam:        4.00 cm 2.25 cm/m  RA Area:     11.00 cm LA Vol (A2C):   46.5 ml 26.17 ml/m RA Volume:   20.30 ml  11.43 ml/m LA Vol (A4C):   68.3 ml 38.45 ml/m LA Biplane Vol: 59.6 ml 33.55 ml/m  AORTIC VALVE LVOT Vmax:   159.00 cm/s LVOT Vmean:  122.000 cm/s LVOT VTI:    0.371 m  AORTA Ao Root diam: 3.10 cm MITRAL VALVE                TRICUSPID VALVE MV Area (PHT): 1.89 cm     TR Peak grad:   16.2 mmHg MV Decel Time: 401 msec     TR Vmax:        201.00 cm/s MV E velocity: 78.10 cm/s MV A velocity: 125.00 cm/s  SHUNTS MV E/A ratio:  0.62         Systemic VTI:  0.37 m                             Systemic Diam: 2.00 cm Fransico Him MD Electronically signed by Fransico Him MD Signature Date/Time: 08/18/2020/9:36:11 AM    Final    ECHOCARDIOGRAM LIMITED  Result Date: 08/18/2020    ECHOCARDIOGRAM LIMITED REPORT   Patient Name:   OLANNA PERCIFIELD Date of Exam: 08/18/2020 Medical Rec #:  [id number]     Height:       64.0 in Accession #:    [id number]    Weight:       159.4 lb Date of Birth:  February 21, 1941      BSA:          1.777 m Patient Age:    39 years      BP:           127/46 mmHg Patient Gender: F             HR:           76 bpm. Exam Location:  Inpatient Procedure: Limited Color Doppler and Intracardiac Opacification Agent Indications:    elevated troponin  History:        Patient has prior history of Echocardiogram examinations.  Sonographer:    Johny Chess Referring Phys: [place]  1. Left ventricular ejection fraction, by estimation, is 70 to 75%. The left ventricle has hyperdynamic function. The left ventricle has no regional wall motion abnormalities.  2. Right ventricular systolic function is normal. The right  ventricular size is mildly enlarged.  3. Limited study with use of Definity echocontrast, LVEF is hyperdynamic. There is significant eccentric LVH predominantly in the apical segments. A cardiac MRI is recommended to evaluate for apical form of hypertrophic cardiomyopathy. FINDINGS  Left Ventricle: Left ventricular ejection fraction, by estimation, is 70 to 75%. The left ventricle has hyperdynamic function. The left ventricle has no regional wall motion abnormalities. Definity contrast agent was given IV to delineate the left ventricular endocardial borders. The left ventricular internal cavity size was normal in size.  There is no left ventricular hypertrophy. Right Ventricle: The right ventricular size is mildly enlarged. No increase in right ventricular wall thickness. Right ventricular systolic function is normal.  Ena Dawley MD Electronically signed by Ena Dawley MD Signature Date/Time: 08/18/2020/12:54:13 PM    Final     Scheduled Meds: . aspirin EC  81 mg Oral Daily  . calcium-vitamin D  1 tablet Oral Daily  . cholecalciferol  1,000 Units Oral Daily  . donepezil  5 mg Oral Daily  . enoxaparin (LOVENOX) injection  40 mg Subcutaneous Q24H  . gabapentin  200 mg Oral QHS  . lamoTRIgine  150 mg Oral QPM  . levothyroxine  150 mcg Oral Daily  . metoprolol tartrate  25 mg Oral BID  . multivitamin with minerals  1 tablet Oral Daily  . OLANZapine  30 mg Oral QHS  . omega-3 acid ethyl esters  1 g Oral Daily  . pantoprazole  40 mg Oral Daily  . QUEtiapine  50 mg Oral QHS  . spironolactone  25 mg Oral Daily  . vitamin B-12  100 mcg Oral Daily   Continuous Infusions: . sodium chloride Stopped (08/18/20 1101)  . azithromycin Stopped (08/18/20 1222)  . cefTRIAXone (ROCEPHIN)  IV Stopped (08/18/20 1661)     LOS: 2 days   Time spent: 33min  Emmet Messer C Shreeya Recendiz, DO [facility]  If 7PM-7AM, please contact night-coverage [email or web address]  08/19/2020, 7:24 AM

## 2020-08-19 NOTE — TOC Initial Note (Signed)
Transition of Care [facility]) - Initial/Assessment Note    Patient Details  Name: [name] MRN: [id number] Date of Birth: [birth year]  Transition of Care [facility]) CM/SW Contact:    Trula Ore, [place] Phone Number: 08/19/2020, 9:20 AM  Clinical Narrative:                   CSW received consult for possible SNF placement at time of discharge. CSW spoke with patient regarding PT recommendation of SNF placement at time of discharge. Patient reported that she comes from [place].  Patient expressed understanding of PT recommendation and is agreeable to SNF placement at time of discharge. Patient reports preference for having physical therapy at [facility] .Patient gave CSW permission to discuss her care with her friend Arville Go.Patient has received both of her COVID vaccines. CSW called [facility] and they confirmed that patient is in Independent living there. CSW was told to call back tomorrow and ask for Will to discuss SNF placement there for patient.CSW will follow up with Will at [facility] tomorrow.No further questions reported at this time. CSW to continue to follow and assist with discharge planning needs.  Expected Discharge Plan: Skilled Nursing Facility Barriers to Discharge: Continued Medical Work up   Patient Goals and CMS Choice Patient states their goals for this hospitalization and ongoing recovery are:: to go to SNF CMS Medicare.gov Compare Post Acute Care list provided to:: Patient Choice offered to / list presented to : Patient  Expected Discharge Plan and Services Expected Discharge Plan: [place]       Living arrangements for the past 2 months: [place]                                      Prior Living Arrangements/Services Living arrangements for the past 2 months: [place] Lives with:: Self Patient language and need for interpreter reviewed:: Yes Do you feel safe going back to the place where  you live?: Yes      Need for Family Participation in Patient Care: Yes (Comment) Care giver support system in place?: Yes (comment)   Criminal Activity/Legal Involvement Pertinent to Current Situation/Hospitalization: No - Comment as needed  Activities of Daily Living      Permission Sought/Granted Permission sought to share information with : Case Manager, Family Supports, Chartered certified accountant granted to share information with : Yes, Verbal Permission Granted  Share Information with NAME: Arville Go  Permission granted to share info w AGENCY: SNF/ALF  Permission granted to share info w Relationship: Friend  Permission granted to share info w Contact Information: Arville Go [phone]  Emotional Assessment   Attitude/Demeanor/Rapport: Gracious Affect (typically observed): Calm Orientation: : Oriented to Self, Oriented to Place, Oriented to  Time, Oriented to Situation Alcohol / Substance Use: Not Applicable Psych Involvement: No (comment)  Admission diagnosis:  Encephalopathy [G93.40] NSTEMI (non-ST elevated myocardial infarction) ([place]) [I21.4] Troponin level elevated [R77.8] Fever, unspecified fever cause [R50.9] Patient Active Problem List   Diagnosis Date Noted  . Community acquired pneumonia of both lower lobes 08/18/2020  . NSTEMI (non-ST elevated myocardial infarction) ([place]) 08/17/2020  . Fever   . Troponin level elevated   . Sepsis ([place]) 02/13/2019  . HLD (hyperlipidemia) 02/13/2019  . Anxiety 02/13/2019  . Chronic diastolic CHF (congestive heart failure) ([place]) 02/13/2019  . GERD (gastroesophageal reflux disease) 02/13/2019  . Nausea vomiting and diarrhea 02/13/2019  . Schizo-affective  schizophrenia ([place])   . Bipolar 1 disorder ([place])   . Pleural effusion on right 06/28/2018  . Osteoarthritis of left knee 11/09/2014  . Nocturia more than twice per night 11/09/2014  . Right bundle branch block 02/23/2013  . Irritable bowel syndrome with constipation  and diarrhea 10/28/2012  . Breast cancer ([place]) 04/07/2012  . Dermatitis 12/24/2011  . Hypercholesterolemia 04/30/2011  . Hypothyroidism 04/30/2011  . Dementia ([place]) 04/30/2011  . Benign hypertensive heart disease without heart failure 04/30/2011   PCP:  [name], MD Pharmacy:   [id number] - [place], [place] [address] [place] [address] Phone: [phone] Fax: [phone]     Social Determinants of Health (SDOH) Interventions    Readmission Risk Interventions No flowsheet data found.

## 2020-08-20 DIAGNOSIS — J189 Pneumonia, unspecified organism: Secondary | ICD-10-CM

## 2020-08-20 LAB — COMPREHENSIVE METABOLIC PANEL
ALT: 24 U/L (ref 0–44)
AST: 18 U/L (ref 15–41)
Albumin: 3 g/dL — ABNORMAL LOW (ref 3.5–5.0)
Alkaline Phosphatase: 32 U/L — ABNORMAL LOW (ref 38–126)
Anion gap: 10 (ref 5–15)
BUN: 11 mg/dL (ref 8–23)
CO2: 25 mmol/L (ref 22–32)
Calcium: 9.6 mg/dL (ref 8.9–10.3)
Chloride: 107 mmol/L (ref 98–111)
Creatinine, Ser: 0.83 mg/dL (ref 0.44–1.00)
GFR calc Af Amer: >60 mL/min (ref >60)
GFR calc non Af Amer: >60 mL/min (ref >60)
Glucose, Bld: 88 mg/dL (ref 70–99)
Potassium: 3.5 mmol/L (ref 3.5–5.1)
Sodium: 142 mmol/L (ref 135–145)
Total Bilirubin: 0.7 mg/dL (ref 0.3–1.2)
Total Protein: 6 g/dL — ABNORMAL LOW (ref 6.5–8.1)

## 2020-08-20 LAB — CBC
HCT: 38 % (ref 36.0–46.0)
Hemoglobin: 12.4 g/dL (ref 12.0–15.0)
MCH: 31.4 pg (ref 26.0–34.0)
MCHC: 32.6 g/dL (ref 30.0–36.0)
MCV: 96.2 fL (ref 80.0–100.0)
Platelets: 245 10*3/uL (ref 150–400)
RBC: 3.95 MIL/uL (ref 3.87–5.11)
RDW: 13.4 % (ref 11.5–15.5)
WBC: 5.4 10*3/uL (ref 4.0–10.5)
nRBC: 0 % (ref 0.0–0.2)

## 2020-08-20 LAB — PROCALCITONIN: Procalcitonin: <0.1 ng/mL

## 2020-08-20 LAB — C-REACTIVE PROTEIN: CRP: 1.4 mg/dL — ABNORMAL HIGH (ref <1.0)

## 2020-08-20 MED ORDER — AZITHROMYCIN 250 MG PO TABS: ORAL_TABLET | ORAL | 0 refills | Status: DC

## 2020-08-20 NOTE — Discharge Instructions (Signed)
Fall Prevention in Hospitals, Adult Being a patient in the hospital puts you at risk for falling. Falls can cause serious injury and harm, but they can be prevented. It is important to understand what puts you at risk for falling and what you and your health care team can do to prevent you from falling. If you or a loved one falls at the hospital, it is important to tell hospital staff about it. What increases the risk for falls? Certain conditions and treatments may increase your risk of falling in the hospital. These include:  Being in an unfamiliar environment, especially when using the bathroom at night.  Having surgery.  Being on bed rest.  Taking many medicines or certain types of medicines, such as sleeping pills.  Having tubes in place, such as IV lines or catheters. Other risk factors for falls in a hospital include:  Having difficulty with hearing or vision.  Having a change in thinking or behavior, such as confusion.  Having depression.  Having trouble with balance.  Being a female.  Feeling dizzy.  Needing to use the toilet frequently.  Having fallen during the past three months.  Having low blood pressure. What are some strategies for preventing falls? If you or a loved one has to stay in the hospital:  Ask about which fall prevention strategies will be in place. Do not hesitate to speak up if you notice that the fall prevention plan has changed.  Ask for help moving around, especially after surgery or when feeling unwell.  If you have been asked to call for help when getting up, do not get up by yourself. Asking for help with getting up is for your safety, and the staff is there to help you.  Wear nonskid footwear.  Get up slowly, and sit at the side of the bed for a few minutes before standing up.  Keep items you need, such as the nurse call button or a phone, close to you so that you do not need to reach for them.  Wear eyeglasses or hearing aids if you  have them.  Have someone stay in the hospital with you or your loved one.  Ask if sleeping pills or other medicines that can cause confusion are necessary. What does the hospital staff do to help prevent falls? Hospitals have systems in place to prevent falls and accidents, which may involve:  Discussing your fall risks and making a personalized fall prevention plan.  Checking in regularly to see if you need help.  Placing an arm band on your wrist or a sign near your room to alert other staff of your needs.  Using an alarm on your hospital bed. This is an alarm that goes off if you get out of bed and forget to call for help.  Keeping the bed in a low and locked position.  Keeping the area around the bed and bathroom well-lit and free from clutter.  Keeping your room quiet, so that you can sleep and be well-rested.  Using safety equipment, such as: ? A belt around your waist. ? Walkers, crutches, and other devices for support. ? Safety beds, such as low beds or cushions on the floor next to the bed.  Having a staff person stay with you (one-on-one observation), even when you are using the bathroom. This is for your safety.  Using video monitoring. This allows a staff member to come to help you if you need help. What other actions can I take to   lower my risk of falls?  Check in regularly with your health care provider or pharmacist to review all of the medicines that you take.  Make sure that you have a regular exercise program to stay fit. This will help you maintain your balance.  Talk with a physical therapist or trainer if recommended by your health care provider. They can help you to improve your strength, balance, and endurance.  If you are over age 65: ? Ask your health care provider if you need a calcium or vitamin D supplement. ? Have your eyes and hearing checked every year. ? Have your feet checked every year. Where to find more information You can find more  information about fall prevention from the Centers for Disease Control and Prevention: www.cdc.gov/steadi Summary  Being in an unfamiliar environment, such as the hospital, increases your risk for falling.  If you have been asked to call for help when getting up, do not get up by yourself. Asking for help with getting up is for your safety, and the staff is there to help you.  Ask about which fall prevention strategies will be in place. Do not hesitate to speak up if you notice that the fall prevention plan has changed.  If you or a loved one falls, tell the hospital staff. This is important. This information is not intended to replace advice given to you by your health care provider. Make sure you discuss any questions you have with your health care provider. Document Revised: 11/06/2017 Document Reviewed: 07/08/2017 Elsevier Patient Education  2020 [employer]vier Inc.  

## 2020-08-20 NOTE — TOC Transition Note (Signed)
Transition of Care [facility]) - CM/SW Discharge Note   Patient Details  Name: [name] MRN: [id number] Date of Birth: 26-Nov-1941  Transition [facility] Care [facility]) CM/SW Contact:  Trula Ore, [place] Phone Number: 08/20/2020, 11:40 AM   Clinical Narrative:     Patient will DC to: Home/[facility]  Anticipated DC date: 08/20/2020  Family notified: Information systems manager by: Car/Friend   ?  Per MD patient ready for DC to home . RN, patient, patient's family, and facility notified of DC.   [place] signing off.  Final next level of care: [place] Barriers to Discharge: No Barriers Identified   Patient Goals and CMS Choice Patient states their goals for this hospitalization and ongoing recovery are:: to go home with home health services CMS Medicare.gov Compare Post Acute Care list provided to:: Patient Choice offered to / list presented to : Patient  Discharge Placement              Patient chooses bed at:  ([facility]) Patient to be transferred to facility by: Friend Name of family member notified: Joann Patient and family notified of of transfer: 08/20/20  Discharge Plan and Services                                     Social Determinants of Health (SDOH) Interventions     Readmission Risk Interventions No flowsheet data found.

## 2020-08-20 NOTE — Discharge Summary (Signed)
Physician Discharge Summary  [name] LKT:[id number] DOB: 1941-09-16 DOA: 08/17/2020  PCP: [name], MD  Admit date: 08/17/2020 Discharge date: 08/20/2020  [facility] Disposition: Same  Recommendations for Outpatient Follow-up:  1. Follow up with PCP in 1-2 weeks 2. Please obtain BMP/CBC in one week 3. Please follow up with physical therapy as discussed  Home Health: Physical therapy, Occupational Therapy Equipment/Devices: None  Discharge Condition: Stable CODE STATUS: Full Diet recommendation: Low-salt low-fat diet  Brief/Interim Summary: [name] a 79 y.o.femalewith medical history significant ofanxiety, schizoaffective disorder, diastolic CHF, bipolar disorder, hypothyroidism,chronic ambulation dysfunction baseline use of walker, presented with fall and fever. Patient was at her usual status of health until early this morning, she was trying to get out of bed but fell down, she feltgeneralized weakness, denied any loss of consciousness or prodromes of lightheaded or blurred vision. Unable to get up from the floor, she started to have whole body shaking,she did not specify as "chills".She further denied any short of breath, no cough, no chest or abdominal pain, no muscle aching, dysuria no diarrhea. No headache or neck pain. She denies any changes to her medications, she has been on the diet with low carbohydrate for 4 months and lost about 25 pounds. EMS arrived, found patient had a fever of 102, hypoxia 85% on room air,very confused. In ED: Patient was confused on arrival,O2 saturation improved soon after arrival without oxygen supplementation. Troponin elevated 340>1100,EKG showed chronic RBBB.WBC 13.2, creatinine 1.1. CT angiogram negative for PE, small bilateral lower field infiltrates.  Patient met as above with profound weakness fatigue with notable imaging concerning for community-acquired pneumonia.  Patient  resolved quite quickly with antibiotics and supportive care, unfortunately she remains markedly weak from baseline, PT recommending ongoing physical therapy which has been arranged at patient's home facility [place].  She is otherwise stable and agreeable for discharge, will continue p.o. antibiotics for 5-day total course otherwise close follow-up with PCP physical therapy as scheduled.Marland Kitchen  Discharge Diagnoses:  Principal Problem:   Community acquired pneumonia of both lower lobes Active Problems:   Sepsis ([place])   Schizo-affective schizophrenia ([place])   Hypothyroidism   HLD (hyperlipidemia)   Chronic diastolic CHF (congestive heart failure) (HCC)   GERD (gastroesophageal reflux disease)   Bipolar 1 disorder (HCC)   NSTEMI (non-ST elevated myocardial infarction) ([place])   Troponin level elevated    Discharge Instructions  Discharge Instructions    Call MD for:  difficulty breathing, headache or visual disturbances   Complete by: As directed    Call MD for:  persistant dizziness or light-headedness   Complete by: As directed    Diet - low sodium heart healthy   Complete by: As directed    Increase activity slowly   Complete by: As directed      Allergies as of 08/20/2020      Reactions   Lithium Nausea Only   Fluoxetine Other (See Comments)   Pt felt crazy   Macrodantin Other (See Comments)   unknown   Mirabegron Other (See Comments)   ineffective   Paroxetine Hcl    Other reaction(s): Other (See Comments) Made pt feel crazy   Paxil [paroxetine] Other (See Comments)   Made pt feel crazy   Prednisone    Makes her feel very jittery   Prozac [fluoxetine Hcl] Other (See Comments)   Pt felt crazy   Sertraline Hcl    Solifenacin Other (See Comments)   Ineffective  Sulfa Antibiotics Other (See Comments)   unknown   Wellbutrin [bupropion Hcl] Other (See Comments)   unknown      Medication List    STOP taking these medications   HYDROcodone-acetaminophen 5-325 MG  tablet Commonly known as: NORCO/VICODIN     TAKE these medications   ALPRAZolam 0.5 MG tablet Commonly known as: XANAX Take 1 mg by mouth 3 (three) times daily as needed for anxiety.   azithromycin 250 MG tablet Commonly known as: Zithromax Take 1 tablet po daily until completed   CALCIUM PO Take 2 tablets by mouth daily.   cholecalciferol 25 MCG (1000 UNIT) tablet Commonly known as: VITAMIN D3 Take 1,000 Units by mouth daily. Wasn't sure if 1000 or 2000 units daily   donepezil 5 MG tablet Commonly known as: ARICEPT Take 5 mg by mouth daily.   fenofibrate 145 MG tablet Commonly known as: TRICOR TAKE 1 TABLET EVERY TUESDAY,THURSDAY,AND SATURDAY AT 6 PM What changed: See the new instructions.   Fish Oil 1000 MG Caps Take 1,000 mg by mouth daily.   gabapentin 100 MG capsule Commonly known as: NEURONTIN TAKE 2 CAPSULES (200 MG TOTAL) BY MOUTH AT BEDTIME.   lamoTRIgine 150 MG tablet Commonly known as: LAMICTAL Take 150 mg by mouth every evening.   metoprolol tartrate 25 MG tablet Commonly known as: LOPRESSOR Take 1 tablet (25 mg total) by mouth daily. Please schedule an appt for further refills. 2nd attempt What changed:   when to take this  additional instructions   MULTIVITAMIN ADULT PO Take 1 tablet by mouth daily.   OLANZapine 15 MG tablet Commonly known as: ZYPREXA Take 30 mg by mouth at bedtime.   omeprazole 40 MG capsule Commonly known as: PRILOSEC Take 40 mg by mouth daily.   ondansetron 4 MG tablet Commonly known as: ZOFRAN Take 4 mg by mouth daily as needed for nausea or vomiting.   QUEtiapine 25 MG tablet Commonly known as: SEROQUEL Take 50 mg by mouth at bedtime.   ramelteon 8 MG tablet Commonly known as: ROZEREM Take 8 mg by mouth at bedtime as needed for sleep.   spironolactone 25 MG tablet Commonly known as: ALDACTONE Take 1 tablet (25 mg total) by mouth daily.   Synthroid 150 MCG tablet Generic drug: levothyroxine TAKE 1 TABLET  (150 MCG TOTAL) BY MOUTH DAILY. What changed: See the new instructions.   VITAMIN B-12 PO Take 1 tablet by mouth daily.       Follow-up Information    Skeet Latch, MD Follow up.   Specialty: Cardiology Why: office will call with date and time of stress test and follow up appointment  Contact information: [address] [place] [address] [phone]              Allergies  Allergen Reactions  . Lithium Nausea Only  . Fluoxetine Other (See Comments)    Pt felt crazy  . Macrodantin Other (See Comments)    unknown  . Mirabegron Other (See Comments)    ineffective  . Paroxetine Hcl     Other reaction(s): Other (See Comments) Made pt feel crazy  . Paxil [Paroxetine] Other (See Comments)    Made pt feel crazy   . Prednisone     Makes her feel very jittery  . Prozac [Fluoxetine Hcl] Other (See Comments)    Pt felt crazy   . Sertraline Hcl   . Solifenacin Other (See Comments)    Ineffective   . Sulfa Antibiotics Other (See  Comments)    unknown  . Wellbutrin [Bupropion Hcl] Other (See Comments)    unknown    Consultations:  None   Procedures/Studies: CT Head Wo Contrast  Result Date: 08/17/2020 CLINICAL DATA:  Head trauma.  Unwitnessed fall. EXAM: CT HEAD WITHOUT CONTRAST TECHNIQUE: Contiguous axial images were obtained from the base of the skull through the vertex without intravenous contrast. COMPARISON:  Head CT 08/28/2013 FINDINGS: Brain: Generalized atrophy with mild chronic small vessel ischemia. No intracranial hemorrhage, mass effect, or midline shift. No hydrocephalus. The basilar cisterns are patent. No evidence of territorial infarct or acute ischemia. No extra-axial or intracranial fluid collection. Vascular: Atherosclerosis of skullbase vasculature without hyperdense vessel or abnormal calcification. Skull: No fracture or focal lesion.  Calvarial hyperostosis. Sinuses/Orbits: No acute findings. Minor mucosal thickening of the  ethmoid air cells. Bilateral cataract resection. Mastoid air cells are clear. Other: None. IMPRESSION: 1. No acute intracranial abnormality. No skull fracture. 2. Generalized atrophy and chronic small vessel ischemia. Electronically Signed   By: Keith Rake M.D.   On: 08/17/2020 15:39   CT Angio Chest PE W/Cm &/Or Wo Cm  Result Date: 08/17/2020 CLINICAL DATA:  Hypoxia EXAM: CT ANGIOGRAPHY CHEST WITH CONTRAST TECHNIQUE: Multidetector CT imaging of the chest was performed using the standard protocol during bolus administration of intravenous contrast. Multiplanar CT image reconstructions and MIPs were obtained to evaluate the vascular anatomy. CONTRAST:  46m OMNIPAQUE IOHEXOL 350 MG/ML SOLN COMPARISON:  08/17/2020, 06/28/2018 FINDINGS: Cardiovascular: This is a technically adequate evaluation of the pulmonary vasculature. No filling defects or pulmonary emboli. Main pulmonary artery is mildly dilated, consistent with pulmonary arterial hypertension. This is a stable finding. The heart is stable without pericardial effusion. Stable atherosclerosis of the coronary vasculature greatest in the LAD distribution. Mild atherosclerosis of the aortic arch. Mediastinum/Nodes: No enlarged mediastinal, hilar, or axillary lymph nodes. Thyroid gland, trachea, and esophagus demonstrate no significant findings. Lungs/Pleura: Scattered areas of linear consolidation at the lung bases likely reflect atelectasis. No acute airspace disease, effusion, or pneumothorax. Central airways are patent. Upper Abdomen: No acute abnormality. Musculoskeletal: No acute or destructive bony lesions. Stable postsurgical changes right breast. Reconstructed images demonstrate no additional findings. Review of the MIP images confirms the above findings. IMPRESSION: 1. No evidence of pulmonary embolus. 2. Stable pulmonary arterial hypertension. 3. Aortic Atherosclerosis (ICD10-I70.0). Coronary artery atherosclerosis. Electronically Signed   By:  MRanda NgoM.D.   On: 08/17/2020 15:40   DG Chest Port 1 View  Result Date: 08/17/2020 CLINICAL DATA:  Hypoxia EXAM: PORTABLE CHEST 1 VIEW COMPARISON:  02/12/2019 FINDINGS: Low volume AP portable examination. Mild cardiomegaly. Mild diffuse interstitial opacity most conspicuous at the lung bases. The visualized skeletal structures are unremarkable. IMPRESSION: Mild cardiomegaly with mild diffuse interstitial opacity most conspicuous at the lung bases, likely mild edema or atypical/viral infection. No focal airspace opacity. Electronically Signed   By: AEddie CandleM.D.   On: 08/17/2020 11:56   ECHOCARDIOGRAM COMPLETE  Result Date: 08/18/2020    ECHOCARDIOGRAM REPORT   Patient Name:   LSHREYA LACASSEDate of Exam: 08/18/2020 Medical Rec #:  [id number]    Height:       64.0 in Accession #:    [id number]   Weight:       159.4 lb Date of Birth:  5Oct 16, 1942     BSA:          1.777 m Patient Age:    89years      BP:  107/53 mmHg Patient Gender: F             HR:           65 bpm. Exam Location:  Inpatient Procedure: 2D Echo Indications:    NSTEMI I21.4  History:        Patient has prior history of Echocardiogram examinations, most                 recent 06/29/2018. Risk Factors:Hypertension and Dyslipidemia.  Sonographer:    Mikki Santee RDCS (AE) Referring Phys: [id number] Lequita Halt IMPRESSIONS  1. Left ventricular ejection fraction, by estimation, is 60 to 65%. The left ventricle has normal function. The left ventricle has no regional wall motion abnormalities. There is mild concentric left ventricular hypertrophy and moderate basal septal hypertrophy.  2. Right ventricular systolic function is normal. The right ventricular size is normal. There is normal pulmonary artery systolic pressure. The estimated right ventricular systolic pressure is 01.0 mmHg.  3. The mitral valve is normal in structure. Trivial mitral valve regurgitation. No evidence of mitral stenosis.  4. The aortic valve is  tricuspid. Aortic valve regurgitation is trivial. Mild aortic valve sclerosis is present, with no evidence of aortic valve stenosis.  5. The inferior vena cava is normal in size with greater than 50% respiratory variability, suggesting right atrial pressure of 3 mmHg.  6. The pericardial effusion is anterior to the right ventricle.  7. Recommend limited study with definity contrast. FINDINGS  Left Ventricle: Left ventricular ejection fraction, by estimation, is 65 to 70%. The left ventricle has normal function. Left ventricular endocardial border not optimally defined to evaluate regional wall motion. The left ventricular internal cavity size was normal in size. There is mild concentric left ventricular hypertrophy. Left ventricular diastolic parameters are consistent with Grade I diastolic dysfunction (impaired relaxation). Elevated left ventricular end-diastolic pressure. Right Ventricle: The right ventricular size is normal. No increase in right ventricular wall thickness. Right ventricular systolic function is normal. There is normal pulmonary artery systolic pressure. The tricuspid regurgitant velocity is 2.01 m/s, and  with an assumed right atrial pressure of 3 mmHg, the estimated right ventricular systolic pressure is 07.1 mmHg. Left Atrium: Left atrial size was normal in size. Right Atrium: Right atrial size was normal in size. Pericardium: Trivial pericardial effusion is present. The pericardial effusion is anterior to the right ventricle. Mitral Valve: The mitral valve is normal in structure. Mild mitral annular calcification. Trivial mitral valve regurgitation. No evidence of mitral valve stenosis. Tricuspid Valve: The tricuspid valve is normal in structure. Tricuspid valve regurgitation is trivial. No evidence of tricuspid stenosis. Aortic Valve: The aortic valve is tricuspid. Aortic valve regurgitation is trivial. Mild aortic valve sclerosis is present, with no evidence of aortic valve stenosis.  Pulmonic Valve: The pulmonic valve was normal in structure. Pulmonic valve regurgitation is not visualized. No evidence of pulmonic stenosis. Aorta: The aortic root is normal in size and structure. Venous: The inferior vena cava is normal in size with greater than 50% respiratory variability, suggesting right atrial pressure of 3 mmHg. IAS/Shunts: No atrial level shunt detected by color flow Doppler.  LEFT VENTRICLE PLAX 2D LVIDd:         4.60 cm  Diastology LVIDs:         2.70 cm  LV e' medial:    3.97 cm/s LV PW:         1.30 cm  LV E/e' medial:  19.7 LV IVS:  1.50 cm  LV e' lateral:   5.19 cm/s LVOT diam:     2.00 cm  LV E/e' lateral: 15.0 LV SV:         117 LV SV Index:   66 LVOT Area:     3.14 cm  RIGHT VENTRICLE RV S prime:     10.90 cm/s TAPSE (M-mode): 2.0 cm LEFT ATRIUM             Index       RIGHT ATRIUM           Index LA diam:        4.00 cm 2.25 cm/m  RA Area:     11.00 cm LA Vol (A2C):   46.5 ml 26.17 ml/m RA Volume:   20.30 ml  11.43 ml/m LA Vol (A4C):   68.3 ml 38.45 ml/m LA Biplane Vol: 59.6 ml 33.55 ml/m  AORTIC VALVE LVOT Vmax:   159.00 cm/s LVOT Vmean:  122.000 cm/s LVOT VTI:    0.371 m  AORTA Ao Root diam: 3.10 cm MITRAL VALVE                TRICUSPID VALVE MV Area (PHT): 1.89 cm     TR Peak grad:   16.2 mmHg MV Decel Time: 401 msec     TR Vmax:        201.00 cm/s MV E velocity: 78.10 cm/s MV A velocity: 125.00 cm/s  SHUNTS MV E/A ratio:  0.62         Systemic VTI:  0.37 m                             Systemic Diam: 2.00 cm Fransico Him MD Electronically signed by Fransico Him MD Signature Date/Time: 08/18/2020/9:36:11 AM    Final    ECHOCARDIOGRAM LIMITED  Result Date: 08/18/2020    ECHOCARDIOGRAM LIMITED REPORT   Patient Name:   MAIDIE STREIGHT Date of Exam: 08/18/2020 Medical Rec #:  [id number]     Height:       64.0 in Accession #:    [id number]    Weight:       159.4 lb Date of Birth:  [birth year]      BSA:          1.777 m Patient Age:    79 years      BP:           127/46 mmHg  Patient Gender: F             HR:           76 bpm. Exam Location:  Inpatient Procedure: Limited Color Doppler and Intracardiac Opacification Agent Indications:    elevated troponin  History:        Patient has prior history of Echocardiogram examinations.  Sonographer:    Johny Chess Referring Phys: [id number]  1. Left ventricular ejection fraction, by estimation, is 70 to 75%. The left ventricle has hyperdynamic function. The left ventricle has no regional wall motion abnormalities.  2. Right ventricular systolic function is normal. The right ventricular size is mildly enlarged.  3. Limited study with use of Definity echocontrast, LVEF is hyperdynamic. There is significant eccentric LVH predominantly in the apical segments. A cardiac MRI is recommended to evaluate for apical form of hypertrophic cardiomyopathy. FINDINGS  Left Ventricle: Left ventricular ejection fraction, by estimation, is 70 to 75%. The left ventricle has hyperdynamic  function. The left ventricle has no regional wall motion abnormalities. Definity contrast agent was given IV to delineate the left ventricular endocardial borders. The left ventricular internal cavity size was normal in size. There is no left ventricular hypertrophy. Right Ventricle: The right ventricular size is mildly enlarged. No increase in right ventricular wall thickness. Right ventricular systolic function is normal.  Ena Dawley MD Electronically signed by Ena Dawley MD Signature Date/Time: 08/18/2020/12:54:13 PM    Final      Subjective: No acute issues or events overnight   Discharge Exam: Vitals:   08/20/20 0521 08/20/20 0857  BP: 130/85 (!) 139/48  Pulse: 65 78  Resp: 18   Temp: 98.5 F (36.9 C)   SpO2: 95%    Vitals:   08/19/20 1950 08/19/20 2137 08/20/20 0521 08/20/20 0857  BP: (!) 140/54  130/85 (!) 139/48  Pulse: 68 64 65 78  Resp: 18  18   Temp: 98.5 F (36.9 C)  98.5 F (36.9 C)   TempSrc: Oral  Oral    SpO2: 94%  95%   Weight:   74.8 kg     General: Pt is alert, awake, not in acute distress Cardiovascular: RRR, S1/S2 +, no rubs, no gallops Respiratory: CTA bilaterally, no wheezing, no rhonchi Abdominal: Soft, NT, ND, bowel sounds + Extremities: no edema, no cyanosis    The results of significant diagnostics from this hospitalization (including imaging, microbiology, ancillary and laboratory) are listed below for reference.     Microbiology: Recent Results (from the past 240 hour(s))  SARS Coronavirus 2 by RT PCR (hospital order, performed in [facility] hospital lab) Nasopharyngeal Nasopharyngeal Swab     Status: None   Collection Time: 08/17/20 12:26 PM   Specimen: Nasopharyngeal Swab  Result Value Ref Range Status   SARS Coronavirus 2 NEGATIVE NEGATIVE Final    Comment: (NOTE) SARS-CoV-2 target nucleic acids are NOT DETECTED.  The SARS-CoV-2 RNA is generally detectable in upper and lower respiratory specimens during the acute phase of infection. The lowest concentration of SARS-CoV-2 viral copies this assay can detect is 250 copies / mL. A negative result does not preclude SARS-CoV-2 infection and should not be used as the sole basis for treatment or other patient management decisions.  A negative result may occur with improper specimen collection / handling, submission of specimen other than nasopharyngeal swab, presence of viral mutation(s) within the areas targeted by this assay, and inadequate number of viral copies (<250 copies / mL). A negative result must be combined with clinical observations, patient history, and epidemiological information.  Fact Sheet for Patients:   StrictlyIdeas.no  Fact Sheet for Healthcare Providers: BankingDealers.co.za  This test is not yet approved or  cleared by the [place] FDA and has been authorized for detection and/or diagnosis of SARS-CoV-2 by FDA under an Emergency Use  Authorization (EUA).  This EUA will remain in effect (meaning this test can be used) for the duration of the COVID-19 declaration under Section 564(b)(1) of the Act, 21 U.S.C. section 360bbb-3(b)(1), unless the authorization is terminated or revoked sooner.  Performed at [facility], [place] [address], [place], [place] [address]   Blood Culture (routine x 2)     Status: None (Preliminary result)   Collection Time: 08/17/20 12:51 PM   Specimen: BLOOD  Result Value Ref Range Status   Specimen Description BLOOD LEFT ANTECUBITAL  Final   Special Requests   Final    BOTTLES DRAWN AEROBIC AND ANAEROBIC Blood Culture results may  not be optimal due to an inadequate volume of blood received in culture bottles   Culture   Final    NO GROWTH 2 DAYS Performed at [facility], [place] [address], [place], [place] [address]    Report Status PENDING  Incomplete  Blood Culture (routine x 2)     Status: None (Preliminary result)   Collection Time: 08/17/20 12:54 PM   Specimen: BLOOD  Result Value Ref Range Status   Specimen Description BLOOD RIGHT ANTECUBITAL  Final   Special Requests   Final    BOTTLES DRAWN AEROBIC AND ANAEROBIC Blood Culture results may not be optimal due to an inadequate volume of blood received in culture bottles   Culture   Final    NO GROWTH 2 DAYS Performed at [facility], [place] [address], [place], [place] [address]    Report Status PENDING  Incomplete  Culture, Urine     Status: None   Collection Time: 08/18/20 12:09 AM   Specimen: Urine, Random  Result Value Ref Range Status   Specimen Description URINE, RANDOM  Final   Special Requests NONE  Final   Culture   Final    NO GROWTH Performed at [facility], [place] [address], [place], [place] [address]    Report Status 08/19/2020 FINAL  Final     Labs: BNP (last 3 results) Recent Labs    08/17/20 1135  BNP 440.3*   Basic Metabolic Panel: Recent Labs  Lab 08/17/20 1135 08/17/20 1148  08/19/20 0718 08/20/20 0333  NA 141 142 142 142  K 3.7 3.5 4.0 3.5  CL 106  --  109 107  CO2 22  --  21* 25  GLUCOSE 124*  --  108* 88  BUN 15  --  8 11  CREATININE 1.10*  --  0.72 0.83  CALCIUM 10.4*  --  9.2 9.6  MG 1.9  --   --   --   PHOS 2.5  --   --   --    Liver Function Tests: Recent Labs  Lab 08/17/20 1135 08/19/20 0718 08/20/20 0333  AST 29 26 18   ALT 34 25 24  ALKPHOS 41 31* 32*  BILITOT 0.9 1.0 0.7  PROT 7.2 5.7* 6.0*  ALBUMIN 4.0 3.0* 3.0*   No results for input(s): LIPASE, AMYLASE in the last 168 hours. Recent Labs  Lab 08/17/20 1135  AMMONIA 38*   CBC: Recent Labs  Lab 08/17/20 1135 08/17/20 1148 08/19/20 0718 08/20/20 0333  WBC 13.2*  --  4.7 5.4  NEUTROABS 11.2*  --   --   --   HGB 14.0 13.9 12.6 12.4  HCT 42.9 41.0 37.8 38.0  MCV 96.4  --  97.9 96.2  PLT 224  --  247 245   Cardiac Enzymes: Recent Labs  Lab 08/17/20 1520  CKTOTAL 231   BNP: Invalid input(s): POCBNP CBG: Recent Labs  Lab 08/17/20 1109  GLUCAP 132*   D-Dimer Recent Labs    08/17/20 1135  DDIMER 0.62*   Hgb A1c No results for input(s): HGBA1C in the last 72 hours. Lipid Profile Recent Labs    08/17/20 1135  TRIG 138   Thyroid function studies Recent Labs    08/17/20 1701  TSH <0.010*   Anemia work up Recent Labs    08/17/20 1135  FERRITIN 97   Urinalysis    Component Value Date/Time   COLORURINE YELLOW 08/18/2020 0018   APPEARANCEUR CLEAR 08/18/2020 0018  LABSPEC 1.020 08/18/2020 0018   PHURINE 6.0 08/18/2020 0018   GLUCOSEU NEGATIVE 08/18/2020 0018   HGBUR NEGATIVE 08/18/2020 0018   BILIRUBINUR NEGATIVE 08/18/2020 0018   KETONESUR NEGATIVE 08/18/2020 0018   PROTEINUR NEGATIVE 08/18/2020 0018   NITRITE NEGATIVE 08/18/2020 0018   LEUKOCYTESUR NEGATIVE 08/18/2020 0018   Sepsis Labs Invalid input(s): PROCALCITONIN,  WBC,  LACTICIDVEN Microbiology Recent Results (from the past 240 hour(s))  SARS Coronavirus 2 by RT PCR (hospital  order, performed in [place] hospital lab) Nasopharyngeal Nasopharyngeal Swab     Status: None   Collection Time: 08/17/20 12:26 PM   Specimen: Nasopharyngeal Swab  Result Value Ref Range Status   SARS Coronavirus 2 NEGATIVE NEGATIVE Final    Comment: (NOTE) SARS-CoV-2 target nucleic acids are NOT DETECTED.  The SARS-CoV-2 RNA is generally detectable in upper and lower respiratory specimens during the acute phase of infection. The lowest concentration of SARS-CoV-2 viral copies this assay can detect is 250 copies / mL. A negative result does not preclude SARS-CoV-2 infection and should not be used as the sole basis for treatment or other patient management decisions.  A negative result may occur with improper specimen collection / handling, submission of specimen other than nasopharyngeal swab, presence of viral mutation(s) within the areas targeted by this assay, and inadequate number of viral copies (<250 copies / mL). A negative result must be combined with clinical observations, patient history, and epidemiological information.  Fact Sheet for Patients:   StrictlyIdeas.no  Fact Sheet for Healthcare Providers: [email or web address]  This test is not yet approved or  cleared by the [place] FDA and has been authorized for detection and/or diagnosis of SARS-CoV-2 by FDA under an Emergency Use Authorization (EUA).  This EUA will remain in effect (meaning this test can be used) for the duration of the COVID-19 declaration under Section 564(b)(1) of the Act, 21 U.S.C. section 360bbb-3(b)(1), unless the authorization is terminated or revoked sooner.  Performed at [facility], [place] [address], [place], [place] [address]   Blood Culture (routine x 2)     Status: None (Preliminary result)   Collection Time: 08/17/20 12:51 PM   Specimen: BLOOD  Result Value Ref Range Status   Specimen Description BLOOD LEFT ANTECUBITAL   Final   Special Requests   Final    BOTTLES DRAWN AEROBIC AND ANAEROBIC Blood Culture results may not be optimal due to an inadequate volume of blood received in culture bottles   Culture   Final    NO GROWTH 2 DAYS Performed at [facility], [place] [address] [place], [place] [address]    Report Status PENDING  Incomplete  Blood Culture (routine x 2)     Status: None (Preliminary result)   Collection Time: 08/17/20 12:54 PM   Specimen: BLOOD  Result Value Ref Range Status   Specimen Description BLOOD RIGHT ANTECUBITAL  Final   Special Requests   Final    BOTTLES DRAWN AEROBIC AND ANAEROBIC Blood Culture results may not be optimal due to an inadequate volume of blood received in culture bottles   Culture   Final    NO GROWTH 2 DAYS Performed at [facility], [place] [address], [place], [place] [address]    Report Status PENDING  Incomplete  Culture, Urine     Status: None   Collection Time: 08/18/20 12:09 AM   Specimen: Urine, Random  Result Value Ref Range Status   Specimen Description URINE, RANDOM  Final  Special Requests NONE  Final   Culture   Final    NO GROWTH Performed at [facility], [place] [address] [place], [place] [address]    Report Status 08/19/2020 FINAL  Final     Time coordinating discharge: Over 30 minutes  SIGNED:   Little Ishikawa, DO [facility] 08/20/2020, 11:06 AM Pager   If 7PM-7AM, please contact night-coverage [email or web address]

## 2020-08-22 LAB — CULTURE, BLOOD (ROUTINE X 2)
Culture: NO GROWTH
Culture: NO GROWTH

## 2020-08-23 DIAGNOSIS — R2681 Unsteadiness on feet: Secondary | ICD-10-CM | POA: Diagnosis not present

## 2020-08-23 DIAGNOSIS — M25562 Pain in left knee: Secondary | ICD-10-CM | POA: Diagnosis not present

## 2020-08-23 DIAGNOSIS — M6281 Muscle weakness (generalized): Secondary | ICD-10-CM | POA: Diagnosis not present

## 2020-08-23 DIAGNOSIS — R262 Difficulty in walking, not elsewhere classified: Secondary | ICD-10-CM | POA: Diagnosis not present

## 2020-08-27 DIAGNOSIS — Z1159 Encounter for screening for other viral diseases: Secondary | ICD-10-CM | POA: Diagnosis not present

## 2020-08-27 DIAGNOSIS — Z20828 Contact with and (suspected) exposure to other viral communicable diseases: Secondary | ICD-10-CM | POA: Diagnosis not present

## 2020-08-28 DIAGNOSIS — I10 Essential (primary) hypertension: Secondary | ICD-10-CM | POA: Diagnosis not present

## 2020-08-28 DIAGNOSIS — Z23 Encounter for immunization: Secondary | ICD-10-CM | POA: Diagnosis not present

## 2020-08-28 DIAGNOSIS — Z79899 Other long term (current) drug therapy: Secondary | ICD-10-CM | POA: Diagnosis not present

## 2020-08-28 DIAGNOSIS — R778 Other specified abnormalities of plasma proteins: Secondary | ICD-10-CM | POA: Diagnosis not present

## 2020-08-29 DIAGNOSIS — R262 Difficulty in walking, not elsewhere classified: Secondary | ICD-10-CM | POA: Diagnosis not present

## 2020-08-29 DIAGNOSIS — M25562 Pain in left knee: Secondary | ICD-10-CM | POA: Diagnosis not present

## 2020-08-29 DIAGNOSIS — M6281 Muscle weakness (generalized): Secondary | ICD-10-CM | POA: Diagnosis not present

## 2020-08-29 DIAGNOSIS — R2681 Unsteadiness on feet: Secondary | ICD-10-CM | POA: Diagnosis not present

## 2020-08-30 DIAGNOSIS — C50911 Malignant neoplasm of unspecified site of right female breast: Secondary | ICD-10-CM | POA: Diagnosis not present

## 2020-08-30 DIAGNOSIS — Z6832 Body mass index (BMI) 32.0-32.9, adult: Secondary | ICD-10-CM | POA: Diagnosis not present

## 2020-08-30 DIAGNOSIS — Z01419 Encounter for gynecological examination (general) (routine) without abnormal findings: Secondary | ICD-10-CM | POA: Diagnosis not present

## 2020-08-30 DIAGNOSIS — F0390 Unspecified dementia without behavioral disturbance: Secondary | ICD-10-CM | POA: Diagnosis not present

## 2020-08-30 DIAGNOSIS — M8580 Other specified disorders of bone density and structure, unspecified site: Secondary | ICD-10-CM | POA: Diagnosis not present

## 2020-08-31 DIAGNOSIS — M25562 Pain in left knee: Secondary | ICD-10-CM | POA: Diagnosis not present

## 2020-08-31 DIAGNOSIS — R262 Difficulty in walking, not elsewhere classified: Secondary | ICD-10-CM | POA: Diagnosis not present

## 2020-08-31 DIAGNOSIS — R2681 Unsteadiness on feet: Secondary | ICD-10-CM | POA: Diagnosis not present

## 2020-08-31 DIAGNOSIS — M6281 Muscle weakness (generalized): Secondary | ICD-10-CM | POA: Diagnosis not present

## 2020-09-03 DIAGNOSIS — M6281 Muscle weakness (generalized): Secondary | ICD-10-CM | POA: Diagnosis not present

## 2020-09-03 DIAGNOSIS — M25562 Pain in left knee: Secondary | ICD-10-CM | POA: Diagnosis not present

## 2020-09-03 DIAGNOSIS — R262 Difficulty in walking, not elsewhere classified: Secondary | ICD-10-CM | POA: Diagnosis not present

## 2020-09-03 DIAGNOSIS — Z20828 Contact with and (suspected) exposure to other viral communicable diseases: Secondary | ICD-10-CM | POA: Diagnosis not present

## 2020-09-03 DIAGNOSIS — Z1159 Encounter for screening for other viral diseases: Secondary | ICD-10-CM | POA: Diagnosis not present

## 2020-09-03 DIAGNOSIS — R2681 Unsteadiness on feet: Secondary | ICD-10-CM | POA: Diagnosis not present

## 2020-09-04 ENCOUNTER — Telehealth: Payer: Self-pay | Admitting: Cardiology

## 2020-09-04 ENCOUNTER — Telehealth (HOSPITAL_COMMUNITY): Payer: Self-pay | Admitting: *Deleted

## 2020-09-04 NOTE — Telephone Encounter (Signed)
New message:    Patient would like for some one to call concerning her up coming apt. stress test

## 2020-09-04 NOTE — Telephone Encounter (Signed)
Left message on voicemail per DPR in reference to upcoming appointment scheduled on 09/04/20 with detailed instructions given per Myocardial Perfusion Study Information Sheet for the test. LM to arrive 15 minutes early, and that it is imperative to arrive on time for appointment to keep from having the test rescheduled. If you need to cancel or reschedule your appointment, please call the office within 24 hours of your appointment. Failure to do so may result in a cancellation of your appointment, and a $50 no show fee. Phone number given for call back for any questions. Kirstie Peri

## 2020-09-05 ENCOUNTER — Telehealth: Payer: Self-pay | Admitting: Cardiology

## 2020-09-05 DIAGNOSIS — M25562 Pain in left knee: Secondary | ICD-10-CM | POA: Diagnosis not present

## 2020-09-05 DIAGNOSIS — R2681 Unsteadiness on feet: Secondary | ICD-10-CM | POA: Diagnosis not present

## 2020-09-05 DIAGNOSIS — M6281 Muscle weakness (generalized): Secondary | ICD-10-CM | POA: Diagnosis not present

## 2020-09-05 DIAGNOSIS — R262 Difficulty in walking, not elsewhere classified: Secondary | ICD-10-CM | POA: Diagnosis not present

## 2020-09-05 NOTE — Telephone Encounter (Signed)
No answer

## 2020-09-05 NOTE — Telephone Encounter (Signed)
No answer, will try again later.

## 2020-09-05 NOTE — Telephone Encounter (Signed)
New message:    Patient returning a call back concering this up coming apt.

## 2020-09-06 DIAGNOSIS — M25562 Pain in left knee: Secondary | ICD-10-CM | POA: Diagnosis not present

## 2020-09-07 ENCOUNTER — Ambulatory Visit (HOSPITAL_COMMUNITY): Payer: Medicare HMO | Attending: Cardiology

## 2020-09-07 ENCOUNTER — Telehealth: Payer: Self-pay | Admitting: Cardiology

## 2020-09-07 ENCOUNTER — Other Ambulatory Visit: Payer: Self-pay

## 2020-09-07 DIAGNOSIS — I259 Chronic ischemic heart disease, unspecified: Secondary | ICD-10-CM | POA: Diagnosis not present

## 2020-09-07 LAB — MYOCARDIAL PERFUSION IMAGING
LV dias vol: 84 mL (ref 46–106)
LV sys vol: 33 mL
Peak HR: 83 {beats}/min
Rest HR: 64 {beats}/min
SDS: 0
SRS: 0
SSS: 0
TID: 1.08

## 2020-09-07 MED ORDER — REGADENOSON 0.4 MG/5ML IV SOLN
0.4000 mg | Freq: Once | INTRAVENOUS | Status: AC
  Administered 2020-09-07: 0.4 mg via INTRAVENOUS

## 2020-09-07 MED ORDER — TECHNETIUM TC 99M TETROFOSMIN IV KIT
10.1000 | PACK | Freq: Once | INTRAVENOUS | Status: AC | PRN
  Administered 2020-09-07: 10.1 via INTRAVENOUS
  Filled 2020-09-07: qty 11

## 2020-09-07 MED ORDER — TECHNETIUM TC 99M TETROFOSMIN IV KIT
31.9000 | PACK | Freq: Once | INTRAVENOUS | Status: AC | PRN
  Administered 2020-09-07: 31.9 via INTRAVENOUS
  Filled 2020-09-07: qty 32

## 2020-09-07 NOTE — Telephone Encounter (Signed)
Spoke with the patient who would like to change her appointment to virtual. Patient would like to do a telephone call will [name] 10/06.      Patient Consent for Virtual Visit         [name] has provided verbal consent on 09/07/2020 for a virtual visit (video or telephone).   CONSENT FOR VIRTUAL VISIT FOR:  [name]  By participating in this virtual visit I agree to the following:  I hereby voluntarily request, consent and authorize [facility] and its employed or contracted physicians, Engineer, materials, nurse practitioners or other licensed health care professionals (the Practitioner), to provide me with telemedicine health care services (the Services") as deemed necessary by the treating Practitioner. I acknowledge and consent to receive the Services by the Practitioner via telemedicine. I understand that the telemedicine visit will involve communicating with the Practitioner through live audiovisual communication technology and the disclosure of certain medical information by electronic transmission. I acknowledge that I have been given the opportunity to request an in-person assessment or other available alternative prior to the telemedicine visit and am voluntarily participating in the telemedicine visit.  I understand that I have the right to withhold or withdraw my consent to the use of telemedicine in the course of my care at any time, without affecting my right to future care or treatment, and that the Practitioner or I may terminate the telemedicine visit at any time. I understand that I have the right to inspect all information obtained and/or recorded in the course of the telemedicine visit and may receive copies of available information for a reasonable fee.  I understand that some of the potential risks of receiving the Services via telemedicine include:   Delay or interruption in medical evaluation due to technological equipment failure or  disruption;  Information transmitted may not be sufficient (e.g. poor resolution of images) to allow for appropriate medical decision making by the Practitioner; and/or   In rare instances, security protocols could fail, causing a breach of personal health information.  Furthermore, I acknowledge that it is my responsibility to provide information about my medical history, conditions and care that is complete and accurate to the best of my ability. I acknowledge that Practitioner's advice, recommendations, and/or decision may be based on factors not within their control, such as incomplete or inaccurate data provided by me or distortions of diagnostic images or specimens that may result from electronic transmissions. I understand that the practice of medicine is not an exact science and that Practitioner makes no warranties or guarantees regarding treatment outcomes. I acknowledge that a copy of this consent can be made available to me via my patient portal ([facility]), or I can request a printed copy by calling the office of [facility].    I understand that my insurance will be billed for this visit.   I have read or had this consent read to me.  I understand the contents of this consent, which adequately explains the benefits and risks of the Services being provided via telemedicine.   I have been provided ample opportunity to ask questions regarding this consent and the Services and have had my questions answered to my satisfaction.  I give my informed consent for the services to be provided through the use of telemedicine in my medical care

## 2020-09-07 NOTE — Telephone Encounter (Signed)
New message:    Patient states some one called her to do a vv APT. I did not see a note of that. Please advise.

## 2020-09-10 ENCOUNTER — Telehealth: Payer: Self-pay | Admitting: Cardiovascular Disease

## 2020-09-10 DIAGNOSIS — R262 Difficulty in walking, not elsewhere classified: Secondary | ICD-10-CM | POA: Diagnosis not present

## 2020-09-10 DIAGNOSIS — M25562 Pain in left knee: Secondary | ICD-10-CM | POA: Diagnosis not present

## 2020-09-10 DIAGNOSIS — M6281 Muscle weakness (generalized): Secondary | ICD-10-CM | POA: Diagnosis not present

## 2020-09-10 DIAGNOSIS — Z1159 Encounter for screening for other viral diseases: Secondary | ICD-10-CM | POA: Diagnosis not present

## 2020-09-10 DIAGNOSIS — R2681 Unsteadiness on feet: Secondary | ICD-10-CM | POA: Diagnosis not present

## 2020-09-10 DIAGNOSIS — Z20828 Contact with and (suspected) exposure to other viral communicable diseases: Secondary | ICD-10-CM | POA: Diagnosis not present

## 2020-09-10 NOTE — Telephone Encounter (Signed)
Patient calling for stress test results.  

## 2020-09-10 NOTE — Telephone Encounter (Signed)
The patient has been notified of the result and verbalized understanding.  All questions (if any) were answered. Antonieta Iba, RN 09/10/2020 10:59 AM

## 2020-09-12 ENCOUNTER — Encounter: Payer: Self-pay | Admitting: Cardiology

## 2020-09-12 ENCOUNTER — Telehealth (INDEPENDENT_AMBULATORY_CARE_PROVIDER_SITE_OTHER): Payer: Medicare HMO | Admitting: Cardiology

## 2020-09-12 VITALS — Ht 64.0 in | Wt 165.0 lb

## 2020-09-12 DIAGNOSIS — R778 Other specified abnormalities of plasma proteins: Secondary | ICD-10-CM | POA: Diagnosis not present

## 2020-09-12 DIAGNOSIS — E782 Mixed hyperlipidemia: Secondary | ICD-10-CM

## 2020-09-12 DIAGNOSIS — I10 Essential (primary) hypertension: Secondary | ICD-10-CM

## 2020-09-12 DIAGNOSIS — I214 Non-ST elevation (NSTEMI) myocardial infarction: Secondary | ICD-10-CM

## 2020-09-12 NOTE — Patient Instructions (Signed)
Medication Instructions:  Your physician recommends that you continue on your current medications as directed. Please refer to the Current Medication list given to you today.  *If you need a refill on your cardiac medications before your next appointment, please call your pharmacy*   Lab Work: None If you have labs (blood work) drawn today and your tests are completely normal, you will receive your results only by: Marland Kitchen MyChart Message (if you have MyChart) OR . A paper copy in the mail If you have any lab test that is abnormal or we need to change your treatment, we will call you to review the results.   Testing/Procedures: None   Follow-Up: At [facility], you and your health needs are our priority.  As part of our continuing mission to provide you with exceptional heart care, we have created designated Provider Care Teams.  These Care Teams include your primary Cardiologist (physician) and Advanced Practice Providers (APPs -  Physician Assistants and Nurse Practitioners) who all work together to provide you with the care you need, when you need it.  We recommend signing up for the patient portal called "MyChart".  Sign up information is provided on this After Visit Summary.  MyChart is used to connect with patients for Virtual Visits (Telemedicine).  Patients are able to view lab/test results, encounter notes, upcoming appointments, etc.  Non-urgent messages can be sent to your provider as well.   To learn more about what you can do with MyChart, go to [email or web address].    Your next appointment:   3 month(s)  The format for your next appointment:   In Person  Provider:   Skeet Latch, MD

## 2020-09-12 NOTE — Progress Notes (Signed)
Virtual Visit via Telephone Note   This visit type was conducted due to national recommendations for restrictions regarding the COVID-19 Pandemic (e.g. social distancing) in an effort to limit this patient's exposure and mitigate transmission in our community.  Due to her co-morbid illnesses, this patient is at least at moderate risk for complications without adequate follow up.  This format is felt to be most appropriate for this patient at this time.  The patient did not have access to video technology/had technical difficulties with video requiring transitioning to audio format only (telephone).  All issues noted in this document were discussed and addressed.  No physical exam could be performed with this format.  Please refer to the patient's chart for her  consent to telehealth for [facility].    Date:  09/12/2020   ID:  [name], DOB 1941-05-24, MRN [id number] The patient was identified using 2 identifiers.  Patient Location: Home Provider Location: Office/Clinic  PCP:  [name], MD  Cardiologist:  Skeet Latch, MD  Electrophysiologist:  None   Evaluation Performed:  Follow-Up Visit  Chief Complaint: Stress test follow-up  History of Present Illness:    [name] is a 79 y.o. female with a hx of schizoaffective disorder, bipolar disorder, breast cancer,hypertension, hyperlipidemia, previous right bundle branch blockwho was seen by cardiology for the evaluation of elevated troponin.   She underwent an echocardiogram 06/2018 which showed vigorous LV function, mild left ventricular hypertrophy, narrow left ventricular outflow tract, G1 DD and trace aortic insufficiency.  She was noted to have had coronary calcification on CT scan at that time.  She was seen in hospital consultation 08/17/2020 for the evaluation of elevated troponin after presenting to the hospital after having an episode of lower extremity weakness. On EMS arrival she was noted to be febrile  with a temp at 102 and hypoxic with O2 saturation at 85%. EKG with no acute ST changes with troponin elevation felt to be secondary to demand ischemia in the setting of infection. An echocardiogram was recommended which showed normal LV function at 65 to 70% with no wall motion abnormalities. Plan at cardiology sign off was outpatient [place] stress test given coronary calcifications on chest CT.  Stress test performed 09/07/2020 which showed no ST segment deviation during stress, normal LV function and no ischemia or infarction, consider low risk study.  Today [name] states that she is doing very well from a CV standpoint with no chest pain, shortness of breath, LE edema, orthopnea, dizziness or syncope.  Stress test as above discussed in depth.  She is asking about increasing her exercise as she lives in a retirement facility and participates in group exercise programs 5 days/week which seems to completely appropriate.  Plan for lab work done in person follow-up with Dr. Oval Linsey in 3 months.  The patient does not have symptoms concerning for COVID-19 infection (fever, chills, cough, or new shortness of breath).    Past Medical History:  Diagnosis Date  . Anxiety   . Arrhythmia    right bundle branch block  . Bipolar 1 disorder ([place])   . Cancer ([place])   . Hyperlipidemia   . Hypertension   . Morbid obesity ([place])   . Osteoarthritis   . Schizo-affective schizophrenia ([place])   . Thyroid disease    hypothyroidism   Past Surgical History:  Procedure Laterality Date  . BREAST SURGERY    . CHOLECYSTECTOMY    . DILATION AND CURETTAGE OF UTERUS    .  ESOPHAGEAL MANOMETRY N/A 01/29/2018   Procedure: ESOPHAGEAL MANOMETRY (EM);  Surgeon: Wonda Horner, MD;  Location: [facility] ENDOSCOPY;  Service: Endoscopy;  Laterality: N/A;  . HAND SURGERY     Right-trigger finger  . IR THORACENTESIS ASP PLEURAL SPACE W/IMG GUIDE  06/28/2018  . IR THORACENTESIS ASP PLEURAL SPACE W/IMG GUIDE  07/19/2018  .  KNEE SURGERY     Left  . TONSILLECTOMY       Current Meds  Medication Sig  . ALPRAZolam (XANAX) 0.5 MG tablet Take 1 mg by mouth 3 (three) times daily as needed for anxiety.   Marland Kitchen amLODipine (NORVASC) 2.5 MG tablet Take 2.5 mg by mouth daily.  Marland Kitchen CALCIUM PO Take 2 tablets by mouth daily.   . cholecalciferol (VITAMIN D3) 25 MCG (1000 UT) tablet Take 1,000 Units by mouth daily. Wasn't sure if 1000 or 2000 units daily  . Cyanocobalamin (VITAMIN B-12 PO) Take 1 tablet by mouth daily.   . fenofibrate (TRICOR) 145 MG tablet TAKE 1 TABLET EVERY TUESDAY,THURSDAY,AND SATURDAY AT 6 PM (Patient taking differently: Take 145 mg by mouth 3 (three) times a week. TAKE 1 TABLET EVERY TUESDAY,THURSDAY,AND SATURDAY AT 6 PM)  . gabapentin (NEURONTIN) 100 MG capsule TAKE 2 CAPSULES (200 MG TOTAL) BY MOUTH AT BEDTIME.  Marland Kitchen lamoTRIgine (LAMICTAL) 150 MG tablet Take 150 mg by mouth every evening.   . memantine (NAMENDA) 10 MG tablet Take 10 mg by mouth 2 (two) times daily.  . metoprolol tartrate (LOPRESSOR) 25 MG tablet Take 1 tablet (25 mg total) by mouth daily. Please schedule an appt for further refills. 2nd attempt (Patient taking differently: Take 25 mg by mouth 2 (two) times daily. )  . Multiple Vitamins-Minerals (MULTIVITAMIN ADULT PO) Take 1 tablet by mouth daily.  Marland Kitchen OLANZapine (ZYPREXA) 15 MG tablet Take 30 mg by mouth at bedtime.   . Omega-3 Fatty Acids (FISH OIL) 1000 MG CAPS Take 1,000 mg by mouth daily.  Marland Kitchen omeprazole (PRILOSEC) 40 MG capsule Take 40 mg by mouth daily.  . QUEtiapine (SEROQUEL) 25 MG tablet Take 50 mg by mouth at bedtime.   . ramelteon (ROZEREM) 8 MG tablet Take 8 mg by mouth at bedtime as needed for sleep.   Marland Kitchen spironolactone (ALDACTONE) 25 MG tablet Take 1 tablet (25 mg total) by mouth daily.  Marland Kitchen SYNTHROID 150 MCG tablet TAKE 1 TABLET (150 MCG TOTAL) BY MOUTH DAILY.     Allergies:   Lithium, Fluoxetine, Macrodantin, Mirabegron, Paroxetine hcl, Paxil [paroxetine], Prednisone, Prozac  [fluoxetine hcl], Sertraline hcl, Solifenacin, Sulfa antibiotics, and Wellbutrin [bupropion hcl]   Social History   Tobacco Use  . Smoking status: Former Smoker    Quit date: 12/08/1978    Years since quitting: 41.7  . Smokeless tobacco: Never Used  Substance Use Topics  . Alcohol use: Yes    Comment: 1 time a year  . Drug use: No     Family Hx: The patient's family history includes Hypertension in her mother.  ROS:   Please see the history of present illness.     All other systems reviewed and are negative.   Prior CV studies:   The following studies were reviewed today:  Lexiscan Myoview stress test 09/07/2020:   Nuclear stress EF: 60%.  There was no ST segment deviation noted during stress.  The study is normal.  This is a low risk study.  The left ventricular ejection fraction is normal (55-65%).  Normal stress nuclear study with no ischemia or infarction; EF 60  with normal wall motion.  Labs/Other Tests and Data Reviewed:    EKG:  An ECG dated 08/18/20 was personally reviewed today and demonstrated:  NSR with IVCD and anterior TWI  Recent Labs: 08/17/2020: B Natriuretic Peptide 156.5; Magnesium 1.9; TSH <0.010 08/20/2020: ALT 24; BUN 11; Creatinine, Ser 0.83; Hemoglobin 12.4; Platelets 245; Potassium 3.5; Sodium 142    Recent Lipid Panel Lab Results  Component Value Date/Time   CHOL 187 10/10/2015 09:33 AM   TRIG 138 08/17/2020 11:35 AM   HDL 36 (L) 10/10/2015 09:33 AM   CHOLHDL 5.2 (H) 10/10/2015 09:33 AM   LDLCALC 88 10/10/2015 09:33 AM   LDLDIRECT 118.0 11/09/2014 08:53 AM    Wt Readings from Last 3 Encounters:  09/12/20 165 lb (74.8 kg)  09/07/20 164 lb (74.4 kg)  08/20/20 164 lb 14.5 oz (74.8 kg)     Objective:    Vital Signs:  Ht 5\' 4"  (1.626 m)   Wt 165 lb (74.8 kg)   BMI 28.32 kg/m    VITAL SIGNS:  reviewed GEN:  no acute distress NEURO:  alert and oriented x 3, no obvious focal deficit PSYCH:  normal affect  ASSESSMENT & PLAN:      1.  Coronary artery disease on CT: -Calcifications noted on chest CT during recent hospitalization with elevated troponin felt to be in the setting of sepsis infection. -Echocardiogram showed normal LV function and no regional wall motion abnormalities. -Lexiscan Myoview 09/07/2020 which showed no ST segment deviation during stress, normal LV function and no ischemia or infarction, consider low risk study. -Continue aggressive risk factor modification with ASA, statin  2.  Hypertension: -Unable to obtain BP today per patient  -Continue current regimen   3.  HLD: -Continue statin -Obtain more recent lipid panel, LFTs at next in person OV   COVID-19 Education: The signs and symptoms of COVID-19 were discussed with the patient and how to seek care for testing (follow up with PCP or arrange E-visit). The importance of social distancing was discussed today.  Time:   Today, I have spent 15 minutes with the patient with telehealth technology discussing the above problems.     Medication Adjustments/Labs and Tests Ordered: Current medicines are reviewed at length with the patient today.  Concerns regarding medicines are outlined above.   Tests Ordered: No orders of the defined types were placed in this encounter.   Medication Changes: No orders of the defined types were placed in this encounter.   Follow Up:  In Person Dr. Oval Linsey in 3 months   Signed, [name], NP  09/12/2020 10:15 AM    [place]

## 2020-09-14 DIAGNOSIS — M6281 Muscle weakness (generalized): Secondary | ICD-10-CM | POA: Diagnosis not present

## 2020-09-14 DIAGNOSIS — R262 Difficulty in walking, not elsewhere classified: Secondary | ICD-10-CM | POA: Diagnosis not present

## 2020-09-14 DIAGNOSIS — R2681 Unsteadiness on feet: Secondary | ICD-10-CM | POA: Diagnosis not present

## 2020-09-14 DIAGNOSIS — M25562 Pain in left knee: Secondary | ICD-10-CM | POA: Diagnosis not present

## 2020-09-17 DIAGNOSIS — R2681 Unsteadiness on feet: Secondary | ICD-10-CM | POA: Diagnosis not present

## 2020-09-17 DIAGNOSIS — R262 Difficulty in walking, not elsewhere classified: Secondary | ICD-10-CM | POA: Diagnosis not present

## 2020-09-17 DIAGNOSIS — M25562 Pain in left knee: Secondary | ICD-10-CM | POA: Diagnosis not present

## 2020-09-17 DIAGNOSIS — M6281 Muscle weakness (generalized): Secondary | ICD-10-CM | POA: Diagnosis not present

## 2020-09-17 DIAGNOSIS — Z20828 Contact with and (suspected) exposure to other viral communicable diseases: Secondary | ICD-10-CM | POA: Diagnosis not present

## 2020-09-17 DIAGNOSIS — Z1159 Encounter for screening for other viral diseases: Secondary | ICD-10-CM | POA: Diagnosis not present

## 2020-09-18 DIAGNOSIS — M25511 Pain in right shoulder: Secondary | ICD-10-CM | POA: Diagnosis not present

## 2020-09-18 DIAGNOSIS — M25562 Pain in left knee: Secondary | ICD-10-CM | POA: Diagnosis not present

## 2020-09-19 DIAGNOSIS — N1831 Chronic kidney disease, stage 3a: Secondary | ICD-10-CM | POA: Diagnosis not present

## 2020-09-19 DIAGNOSIS — E782 Mixed hyperlipidemia: Secondary | ICD-10-CM | POA: Diagnosis not present

## 2020-09-19 DIAGNOSIS — E039 Hypothyroidism, unspecified: Secondary | ICD-10-CM | POA: Diagnosis not present

## 2020-09-19 DIAGNOSIS — I129 Hypertensive chronic kidney disease with stage 1 through stage 4 chronic kidney disease, or unspecified chronic kidney disease: Secondary | ICD-10-CM | POA: Diagnosis not present

## 2020-09-19 DIAGNOSIS — G301 Alzheimer's disease with late onset: Secondary | ICD-10-CM | POA: Diagnosis not present

## 2020-09-19 DIAGNOSIS — F319 Bipolar disorder, unspecified: Secondary | ICD-10-CM | POA: Diagnosis not present

## 2020-09-20 DIAGNOSIS — I129 Hypertensive chronic kidney disease with stage 1 through stage 4 chronic kidney disease, or unspecified chronic kidney disease: Secondary | ICD-10-CM | POA: Diagnosis not present

## 2020-09-20 DIAGNOSIS — R269 Unspecified abnormalities of gait and mobility: Secondary | ICD-10-CM | POA: Diagnosis not present

## 2020-09-20 DIAGNOSIS — N1831 Chronic kidney disease, stage 3a: Secondary | ICD-10-CM | POA: Diagnosis not present

## 2020-09-20 DIAGNOSIS — R7303 Prediabetes: Secondary | ICD-10-CM | POA: Diagnosis not present

## 2020-09-21 DIAGNOSIS — R2681 Unsteadiness on feet: Secondary | ICD-10-CM | POA: Diagnosis not present

## 2020-09-21 DIAGNOSIS — R262 Difficulty in walking, not elsewhere classified: Secondary | ICD-10-CM | POA: Diagnosis not present

## 2020-09-21 DIAGNOSIS — M6281 Muscle weakness (generalized): Secondary | ICD-10-CM | POA: Diagnosis not present

## 2020-09-21 DIAGNOSIS — M25562 Pain in left knee: Secondary | ICD-10-CM | POA: Diagnosis not present

## 2020-09-24 DIAGNOSIS — M6281 Muscle weakness (generalized): Secondary | ICD-10-CM | POA: Diagnosis not present

## 2020-09-24 DIAGNOSIS — R2681 Unsteadiness on feet: Secondary | ICD-10-CM | POA: Diagnosis not present

## 2020-09-24 DIAGNOSIS — Z20828 Contact with and (suspected) exposure to other viral communicable diseases: Secondary | ICD-10-CM | POA: Diagnosis not present

## 2020-09-24 DIAGNOSIS — Z1159 Encounter for screening for other viral diseases: Secondary | ICD-10-CM | POA: Diagnosis not present

## 2020-09-24 DIAGNOSIS — M25562 Pain in left knee: Secondary | ICD-10-CM | POA: Diagnosis not present

## 2020-09-24 DIAGNOSIS — R262 Difficulty in walking, not elsewhere classified: Secondary | ICD-10-CM | POA: Diagnosis not present

## 2020-09-27 DIAGNOSIS — M6281 Muscle weakness (generalized): Secondary | ICD-10-CM | POA: Diagnosis not present

## 2020-09-27 DIAGNOSIS — R2681 Unsteadiness on feet: Secondary | ICD-10-CM | POA: Diagnosis not present

## 2020-09-27 DIAGNOSIS — R262 Difficulty in walking, not elsewhere classified: Secondary | ICD-10-CM | POA: Diagnosis not present

## 2020-09-27 DIAGNOSIS — M25562 Pain in left knee: Secondary | ICD-10-CM | POA: Diagnosis not present

## 2020-09-28 NOTE — Telephone Encounter (Cosign Needed)
Patient had telephone visit 10/6

## 2020-10-01 DIAGNOSIS — R2681 Unsteadiness on feet: Secondary | ICD-10-CM | POA: Diagnosis not present

## 2020-10-01 DIAGNOSIS — Z20828 Contact with and (suspected) exposure to other viral communicable diseases: Secondary | ICD-10-CM | POA: Diagnosis not present

## 2020-10-01 DIAGNOSIS — Z1159 Encounter for screening for other viral diseases: Secondary | ICD-10-CM | POA: Diagnosis not present

## 2020-10-01 DIAGNOSIS — M6281 Muscle weakness (generalized): Secondary | ICD-10-CM | POA: Diagnosis not present

## 2020-10-01 DIAGNOSIS — R262 Difficulty in walking, not elsewhere classified: Secondary | ICD-10-CM | POA: Diagnosis not present

## 2020-10-01 DIAGNOSIS — M25562 Pain in left knee: Secondary | ICD-10-CM | POA: Diagnosis not present

## 2020-10-03 DIAGNOSIS — M25562 Pain in left knee: Secondary | ICD-10-CM | POA: Diagnosis not present

## 2020-10-03 DIAGNOSIS — R2681 Unsteadiness on feet: Secondary | ICD-10-CM | POA: Diagnosis not present

## 2020-10-03 DIAGNOSIS — M6281 Muscle weakness (generalized): Secondary | ICD-10-CM | POA: Diagnosis not present

## 2020-10-03 DIAGNOSIS — R262 Difficulty in walking, not elsewhere classified: Secondary | ICD-10-CM | POA: Diagnosis not present

## 2020-10-08 DIAGNOSIS — Z1159 Encounter for screening for other viral diseases: Secondary | ICD-10-CM | POA: Diagnosis not present

## 2020-10-08 DIAGNOSIS — Z20828 Contact with and (suspected) exposure to other viral communicable diseases: Secondary | ICD-10-CM | POA: Diagnosis not present

## 2020-10-11 DIAGNOSIS — M25562 Pain in left knee: Secondary | ICD-10-CM | POA: Diagnosis not present

## 2020-10-11 DIAGNOSIS — R2681 Unsteadiness on feet: Secondary | ICD-10-CM | POA: Diagnosis not present

## 2020-10-11 DIAGNOSIS — M6281 Muscle weakness (generalized): Secondary | ICD-10-CM | POA: Diagnosis not present

## 2020-10-11 DIAGNOSIS — R262 Difficulty in walking, not elsewhere classified: Secondary | ICD-10-CM | POA: Diagnosis not present

## 2020-10-15 DIAGNOSIS — R262 Difficulty in walking, not elsewhere classified: Secondary | ICD-10-CM | POA: Diagnosis not present

## 2020-10-15 DIAGNOSIS — M25562 Pain in left knee: Secondary | ICD-10-CM | POA: Diagnosis not present

## 2020-10-15 DIAGNOSIS — M6281 Muscle weakness (generalized): Secondary | ICD-10-CM | POA: Diagnosis not present

## 2020-10-15 DIAGNOSIS — Z1159 Encounter for screening for other viral diseases: Secondary | ICD-10-CM | POA: Diagnosis not present

## 2020-10-15 DIAGNOSIS — R2681 Unsteadiness on feet: Secondary | ICD-10-CM | POA: Diagnosis not present

## 2020-10-15 DIAGNOSIS — Z20828 Contact with and (suspected) exposure to other viral communicable diseases: Secondary | ICD-10-CM | POA: Diagnosis not present

## 2020-10-17 DIAGNOSIS — R2681 Unsteadiness on feet: Secondary | ICD-10-CM | POA: Diagnosis not present

## 2020-10-17 DIAGNOSIS — M6281 Muscle weakness (generalized): Secondary | ICD-10-CM | POA: Diagnosis not present

## 2020-10-17 DIAGNOSIS — M25562 Pain in left knee: Secondary | ICD-10-CM | POA: Diagnosis not present

## 2020-10-17 DIAGNOSIS — R262 Difficulty in walking, not elsewhere classified: Secondary | ICD-10-CM | POA: Diagnosis not present

## 2020-10-22 DIAGNOSIS — Z20828 Contact with and (suspected) exposure to other viral communicable diseases: Secondary | ICD-10-CM | POA: Diagnosis not present

## 2020-10-22 DIAGNOSIS — Z1159 Encounter for screening for other viral diseases: Secondary | ICD-10-CM | POA: Diagnosis not present

## 2020-10-29 ENCOUNTER — Other Ambulatory Visit: Payer: Self-pay | Admitting: Geriatric Medicine

## 2020-10-29 ENCOUNTER — Ambulatory Visit
Admission: RE | Admit: 2020-10-29 | Discharge: 2020-10-29 | Disposition: A | Discharge status: Home / Self Care | Payer: Medicare HMO | Source: Ambulatory Visit | Attending: Geriatric Medicine | Admitting: Geriatric Medicine

## 2020-10-29 DIAGNOSIS — R0781 Pleurodynia: Secondary | ICD-10-CM | POA: Diagnosis not present

## 2020-10-29 DIAGNOSIS — R0789 Other chest pain: Secondary | ICD-10-CM | POA: Diagnosis not present

## 2020-10-29 DIAGNOSIS — M419 Scoliosis, unspecified: Secondary | ICD-10-CM | POA: Diagnosis not present

## 2020-10-29 DIAGNOSIS — M25511 Pain in right shoulder: Secondary | ICD-10-CM | POA: Diagnosis not present

## 2020-10-29 DIAGNOSIS — Z1159 Encounter for screening for other viral diseases: Secondary | ICD-10-CM | POA: Diagnosis not present

## 2020-10-29 DIAGNOSIS — Z20828 Contact with and (suspected) exposure to other viral communicable diseases: Secondary | ICD-10-CM | POA: Diagnosis not present

## 2020-10-30 DIAGNOSIS — R0781 Pleurodynia: Secondary | ICD-10-CM | POA: Diagnosis not present

## 2020-10-30 DIAGNOSIS — M25511 Pain in right shoulder: Secondary | ICD-10-CM | POA: Diagnosis not present

## 2020-10-30 DIAGNOSIS — M25562 Pain in left knee: Secondary | ICD-10-CM | POA: Diagnosis not present

## 2020-11-05 DIAGNOSIS — Z20828 Contact with and (suspected) exposure to other viral communicable diseases: Secondary | ICD-10-CM | POA: Diagnosis not present

## 2020-11-05 DIAGNOSIS — Z1159 Encounter for screening for other viral diseases: Secondary | ICD-10-CM | POA: Diagnosis not present

## 2020-11-07 DIAGNOSIS — M25511 Pain in right shoulder: Secondary | ICD-10-CM | POA: Diagnosis not present

## 2020-11-12 DIAGNOSIS — Z1159 Encounter for screening for other viral diseases: Secondary | ICD-10-CM | POA: Diagnosis not present

## 2020-11-12 DIAGNOSIS — Z20828 Contact with and (suspected) exposure to other viral communicable diseases: Secondary | ICD-10-CM | POA: Diagnosis not present

## 2020-11-13 DIAGNOSIS — R2681 Unsteadiness on feet: Secondary | ICD-10-CM | POA: Diagnosis not present

## 2020-11-13 DIAGNOSIS — M6281 Muscle weakness (generalized): Secondary | ICD-10-CM | POA: Diagnosis not present

## 2020-11-13 DIAGNOSIS — M25511 Pain in right shoulder: Secondary | ICD-10-CM | POA: Diagnosis not present

## 2020-11-13 DIAGNOSIS — M25611 Stiffness of right shoulder, not elsewhere classified: Secondary | ICD-10-CM | POA: Diagnosis not present

## 2020-11-15 DIAGNOSIS — M25611 Stiffness of right shoulder, not elsewhere classified: Secondary | ICD-10-CM | POA: Diagnosis not present

## 2020-11-15 DIAGNOSIS — M25511 Pain in right shoulder: Secondary | ICD-10-CM | POA: Diagnosis not present

## 2020-11-15 DIAGNOSIS — M6281 Muscle weakness (generalized): Secondary | ICD-10-CM | POA: Diagnosis not present

## 2020-11-15 DIAGNOSIS — R2681 Unsteadiness on feet: Secondary | ICD-10-CM | POA: Diagnosis not present

## 2020-11-19 DIAGNOSIS — Z0000 Encounter for general adult medical examination without abnormal findings: Secondary | ICD-10-CM | POA: Diagnosis not present

## 2020-11-19 DIAGNOSIS — I700 Atherosclerosis of aorta: Secondary | ICD-10-CM | POA: Diagnosis not present

## 2020-11-19 DIAGNOSIS — G301 Alzheimer's disease with late onset: Secondary | ICD-10-CM | POA: Diagnosis not present

## 2020-11-19 DIAGNOSIS — I129 Hypertensive chronic kidney disease with stage 1 through stage 4 chronic kidney disease, or unspecified chronic kidney disease: Secondary | ICD-10-CM | POA: Diagnosis not present

## 2020-11-19 DIAGNOSIS — N1831 Chronic kidney disease, stage 3a: Secondary | ICD-10-CM | POA: Diagnosis not present

## 2020-11-19 DIAGNOSIS — R7303 Prediabetes: Secondary | ICD-10-CM | POA: Diagnosis not present

## 2020-11-19 DIAGNOSIS — F319 Bipolar disorder, unspecified: Secondary | ICD-10-CM | POA: Diagnosis not present

## 2020-11-19 DIAGNOSIS — E782 Mixed hyperlipidemia: Secondary | ICD-10-CM | POA: Diagnosis not present

## 2020-11-19 DIAGNOSIS — Z79899 Other long term (current) drug therapy: Secondary | ICD-10-CM | POA: Diagnosis not present

## 2020-11-19 DIAGNOSIS — K9089 Other intestinal malabsorption: Secondary | ICD-10-CM | POA: Diagnosis not present

## 2020-11-19 DIAGNOSIS — F259 Schizoaffective disorder, unspecified: Secondary | ICD-10-CM | POA: Diagnosis not present

## 2020-11-19 DIAGNOSIS — Z1389 Encounter for screening for other disorder: Secondary | ICD-10-CM | POA: Diagnosis not present

## 2020-11-19 DIAGNOSIS — E039 Hypothyroidism, unspecified: Secondary | ICD-10-CM | POA: Diagnosis not present

## 2020-11-19 DIAGNOSIS — F0280 Dementia in other diseases classified elsewhere without behavioral disturbance: Secondary | ICD-10-CM | POA: Diagnosis not present

## 2020-11-20 DIAGNOSIS — N1831 Chronic kidney disease, stage 3a: Secondary | ICD-10-CM | POA: Diagnosis not present

## 2020-11-20 DIAGNOSIS — E782 Mixed hyperlipidemia: Secondary | ICD-10-CM | POA: Diagnosis not present

## 2020-11-20 DIAGNOSIS — G301 Alzheimer's disease with late onset: Secondary | ICD-10-CM | POA: Diagnosis not present

## 2020-11-20 DIAGNOSIS — F319 Bipolar disorder, unspecified: Secondary | ICD-10-CM | POA: Diagnosis not present

## 2020-11-20 DIAGNOSIS — I129 Hypertensive chronic kidney disease with stage 1 through stage 4 chronic kidney disease, or unspecified chronic kidney disease: Secondary | ICD-10-CM | POA: Diagnosis not present

## 2020-11-20 DIAGNOSIS — K219 Gastro-esophageal reflux disease without esophagitis: Secondary | ICD-10-CM | POA: Diagnosis not present

## 2020-11-20 DIAGNOSIS — E039 Hypothyroidism, unspecified: Secondary | ICD-10-CM | POA: Diagnosis not present

## 2020-11-26 DIAGNOSIS — Z1159 Encounter for screening for other viral diseases: Secondary | ICD-10-CM | POA: Diagnosis not present

## 2020-11-26 DIAGNOSIS — Z20828 Contact with and (suspected) exposure to other viral communicable diseases: Secondary | ICD-10-CM | POA: Diagnosis not present

## 2020-11-29 DIAGNOSIS — Z1231 Encounter for screening mammogram for malignant neoplasm of breast: Secondary | ICD-10-CM | POA: Diagnosis not present

## 2020-12-03 DIAGNOSIS — Z20828 Contact with and (suspected) exposure to other viral communicable diseases: Secondary | ICD-10-CM | POA: Diagnosis not present

## 2020-12-03 DIAGNOSIS — Z1159 Encounter for screening for other viral diseases: Secondary | ICD-10-CM | POA: Diagnosis not present

## 2020-12-06 DIAGNOSIS — R928 Other abnormal and inconclusive findings on diagnostic imaging of breast: Secondary | ICD-10-CM | POA: Diagnosis not present

## 2020-12-06 DIAGNOSIS — R921 Mammographic calcification found on diagnostic imaging of breast: Secondary | ICD-10-CM | POA: Diagnosis not present

## 2020-12-10 DIAGNOSIS — Z20828 Contact with and (suspected) exposure to other viral communicable diseases: Secondary | ICD-10-CM | POA: Diagnosis not present

## 2020-12-10 DIAGNOSIS — Z1159 Encounter for screening for other viral diseases: Secondary | ICD-10-CM | POA: Diagnosis not present

## 2020-12-13 ENCOUNTER — Other Ambulatory Visit: Payer: Self-pay

## 2020-12-13 ENCOUNTER — Ambulatory Visit (INDEPENDENT_AMBULATORY_CARE_PROVIDER_SITE_OTHER): Payer: Medicare HMO | Admitting: Cardiovascular Disease

## 2020-12-13 ENCOUNTER — Encounter: Payer: Self-pay | Admitting: Cardiovascular Disease

## 2020-12-13 VITALS — BP 130/70 | HR 75 | Ht 63.0 in | Wt 171.2 lb

## 2020-12-13 DIAGNOSIS — I10 Essential (primary) hypertension: Secondary | ICD-10-CM

## 2020-12-13 DIAGNOSIS — R778 Other specified abnormalities of plasma proteins: Secondary | ICD-10-CM

## 2020-12-13 DIAGNOSIS — I119 Hypertensive heart disease without heart failure: Secondary | ICD-10-CM | POA: Diagnosis not present

## 2020-12-13 DIAGNOSIS — I214 Non-ST elevation (NSTEMI) myocardial infarction: Secondary | ICD-10-CM | POA: Diagnosis not present

## 2020-12-13 DIAGNOSIS — I5032 Chronic diastolic (congestive) heart failure: Secondary | ICD-10-CM

## 2020-12-13 DIAGNOSIS — E782 Mixed hyperlipidemia: Secondary | ICD-10-CM | POA: Diagnosis not present

## 2020-12-13 DIAGNOSIS — E7800 Pure hypercholesterolemia, unspecified: Secondary | ICD-10-CM

## 2020-12-13 DIAGNOSIS — Z5181 Encounter for therapeutic drug level monitoring: Secondary | ICD-10-CM

## 2020-12-13 MED ORDER — ROSUVASTATIN CALCIUM 20 MG PO TABS: 20.0000 mg | ORAL_TABLET | Freq: Every day | ORAL | 3 refills | Status: DC

## 2020-12-13 NOTE — Progress Notes (Signed)
Cardiology Office Note   Date:  12/13/2020   ID:  [name], DOB Nov 04, 1941, MRN [id number]  PCP:  Merlene Laughter, MD  Cardiologist:   Chilton Si, MD   No chief complaint on file.   History of Present Illness: [name] is a 80 y.o. female with  hypertension, hyperlipidemia, chronic shortness of breath, chronic diastolic heart failure, morbid obesity,  prior breast cancer, Alzheimer's disease, schizo-affective disorder and bipolar disorder who presents for follow up.  She was previously a patient of Dr. Patty Sermons.  She previously had a nuclear stress 05/2014 that was negative for ischemia.  She had an echo 04/2008 that revealed normal systolic function, mild aortic sclerosis, trace mitral regurgitation and trace tricuspid regurgitation.  [name] lives at [facility] She has several friends there and is very happy.  At her last appointment she was noted to have a murmur and was referred for an echo that revealed LVEF 65-70% with grade 1 diastolic dysfunction.  She was noted to have a narrow LVOT.   Three weeks ago [name] had a bad fall. She got up to use the bathroom in the middle of the night.  She didn't feel dizzy and didn't trip but she fell forward into her bathroom shower. Since then she has been struggling with pain all over her body.  She has been doing physical therapy twice weekly.  Since then she hasn't been able to fully resume her exercise routine.  She hasn't noted any increased LE edema, nor orthopnea or PND.  She denies chest pain or shortness of breath.  She sometimes worries about memory loss but tries not to let it bother her.   [name] was admitted to the hospital 08/2020 with a fall and fever.  She reported generalized weakness and no preceding cardiac symptoms.  Chest CT noted coronary calcifications.  Cardiac enzymes were elevated and cardiology consulted.  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  At the time she was febrile and hypoxic.  Symptoms were thought to be due to demand ischemia and outpatient evaluation was recommended.  She had a nuclear stress test 09/2020 that revealed LVEF 60% and no perfusion deficits.  She followed up with Georgie Chard, NP, on 09/2020 and was doing well.  In the last two weeks she had two mammograms that were abnormal.  She has an upcoming needle biopsy on the L breast.  She is struggling with bad arthritis pain in her L knee and R shoulder.  She sees her orthopedic doctor and is looking forward to injection.  She denies chest pain.  She hasn't been able to exercise due to her orthopedic issues.  She looks forward to starting back after her injections.  She also enjoys walking.  She has some shortness of breath when trying to bed over but not with her exercise classes.  She also gets short of breath getting and and out of bed. She has no LE edema, orthopnea or PND.  Her BP has been well-controlled.  Past Medical History:  Diagnosis Date  . Anxiety   . Arrhythmia    right bundle branch block  . Bipolar 1 disorder (HCC)   . Cancer (HCC)   . Hyperlipidemia   . Hypertension   . Morbid obesity (HCC)   . Osteoarthritis   . Schizo-affective schizophrenia (HCC)   . Thyroid disease    hypothyroidism    Past Surgical History:  Procedure  Laterality Date  . BREAST SURGERY    . CHOLECYSTECTOMY    . DILATION AND CURETTAGE OF UTERUS    . ESOPHAGEAL MANOMETRY N/A 01/29/2018   Procedure: ESOPHAGEAL MANOMETRY (EM);  Surgeon: Graylin Shiver, MD;  Location: [facility];  Service: Endoscopy;  Laterality: N/A;  . HAND SURGERY     Right-trigger finger  . IR THORACENTESIS ASP PLEURAL SPACE W/IMG GUIDE  06/28/2018  . IR THORACENTESIS ASP PLEURAL SPACE W/IMG GUIDE  07/19/2018  . KNEE SURGERY     Left  . TONSILLECTOMY       Current Outpatient Medications  Medication Sig Dispense Refill  . ALPRAZolam (XANAX) 0.5 MG tablet Take 1 mg by mouth 3  (three) times daily as needed for anxiety.     Marland Kitchen amLODipine (NORVASC) 2.5 MG tablet Take 2.5 mg by mouth daily.    Marland Kitchen CALCIUM PO Take 2 tablets by mouth daily.     . cholecalciferol (VITAMIN D3) 25 MCG (1000 UT) tablet Take 1,000 Units by mouth daily. Wasn't sure if 1000 or 2000 units daily    . Cyanocobalamin (VITAMIN B-12 PO) Take 1 tablet by mouth daily.     Marland Kitchen gabapentin (NEURONTIN) 100 MG capsule TAKE 2 CAPSULES (200 MG TOTAL) BY MOUTH AT BEDTIME. 60 capsule 7  . lamoTRIgine (LAMICTAL) 150 MG tablet Take 150 mg by mouth every evening.   12  . levothyroxine (SYNTHROID) 137 MCG tablet Take 137 mcg by mouth daily before breakfast.    . memantine (NAMENDA) 10 MG tablet Take 10 mg by mouth 2 (two) times daily.    . metoprolol tartrate (LOPRESSOR) 25 MG tablet Take 25 mg by mouth 2 (two) times daily.    . Multiple Vitamins-Minerals (MULTIVITAMIN ADULT PO) Take 1 tablet by mouth daily.    Marland Kitchen OLANZapine (ZYPREXA) 15 MG tablet Take 30 mg by mouth at bedtime.    . Omega-3 Fatty Acids (FISH OIL) 1000 MG CAPS Take 1,000 mg by mouth daily.    Marland Kitchen omeprazole (PRILOSEC) 40 MG capsule Take 40 mg by mouth daily.    . QUEtiapine (SEROQUEL) 25 MG tablet Take 50 mg by mouth at bedtime.     . ramelteon (ROZEREM) 8 MG tablet Take 8 mg by mouth at bedtime as needed for sleep.    . rosuvastatin (CRESTOR) 20 MG tablet Take 1 tablet (20 mg total) by mouth daily. 90 tablet 3  . spironolactone (ALDACTONE) 25 MG tablet Take 1 tablet (25 mg total) by mouth daily. 90 tablet 3   No current facility-administered medications for this visit.    Allergies:   Lithium, Fluoxetine, Macrodantin, Mirabegron, Paroxetine hcl, Paxil [paroxetine], Prednisone, Prozac [fluoxetine hcl], Sertraline hcl, Solifenacin, Sulfa antibiotics, and Wellbutrin [bupropion hcl]    Social History:  The patient  reports that she quit smoking about 42 years ago. She has never used smokeless tobacco. She reports current alcohol use. She reports that she  does not use drugs.   Family History:  The patient's family history includes Hypertension in her mother.    ROS:  Please see the history of present illness.   Otherwise, review of systems are positive for none.   All other systems are reviewed and negative.    PHYSICAL EXAM: VS:  BP 130/70   Pulse 75   Ht 5\' 3"  (1.6 m)   Wt 171 lb 3.2 oz (77.7 kg)   BMI 30.33 kg/m  , BMI Body mass index is 30.33 kg/m. GENERAL:  Well appearing elderly woman in  no acute distress HEENT: Pupils equal round and reactive, fundi not visualized, oral mucosa unremarkable NECK:  No jugular venous distention, waveform within normal limits, carotid upstroke brisk and symmetric, no bruits, no thyromegaly LUNGS:  Clear to auscultation bilaterally HEART:  RRR.  PMI not displaced or sustained,S1 and S2 within normal limits, no S3, no S4, no clicks, no rubs, II/VI systolic murmur at the LUSB ABD:  Flat, positive bowel sounds normal in frequency in pitch, no bruits, no rebound, no guarding, no midline pulsatile mass, no hepatomegaly, no splenomegaly EXT:  2 plus pulses throughout, no edema, no cyanosis no clubbing SKIN:  No rashes no nodules.  Multiple ecchymoses. NEURO:  Cranial nerves II through XII grossly intact, motor grossly intact throughout PSYCH:  Cognitively intact, oriented to person place and time   EKG:  EKG is ordered today. The ekg ordered today demonstrates sinus rhythm.  Rate 71 bpm.  RBBB.  Lateral TWI 08/20/17: Sinus rhythm.  Rate 65 bpm.  RBBB.   12/13/2020: Sinus rhythm.  Rate 75 bpm.  Right bundle branch block.  LVH with secondary repolarization abnormality.  Lexiscan Myoview 09/2020:  Nuclear stress EF: 60%.  There was no ST segment deviation noted during stress.  The study is normal.  This is a low risk study.  The left ventricular ejection fraction is normal (55-65%).   Normal stress nuclear study with no ischemia or infarction; EF 60 with normal wall motion.  Echo 08/18/20: 1.  Left ventricular ejection fraction, by estimation, is 70 to 75%. The  left ventricle has hyperdynamic function. The left ventricle has no  regional wall motion abnormalities.  2. Right ventricular systolic function is normal. The right ventricular  size is mildly enlarged.  3. Limited study with use of Definity echocontrast, LVEF is hyperdynamic.  There is significant eccentric LVH predominantly in the apical segments. A  cardiac MRI is recommended to evaluate for apical form of hypertrophic  cardiomyopathy.    Echo 02/12/17: Study Conclusions  - Left ventricle: Narrow LVOT No obstruction at rest. The cavity   size was normal. Wall thickness was increased in a pattern of   mild LVH. Systolic function was vigorous. The estimated ejection   fraction was in the range of 65% to 70%. Doppler parameters are   consistent with abnormal left ventricular relaxation (grade 1   diastolic dysfunction). - Aortic valve: There was trivial regurgitation. - Mitral valve: Calcified annulus. Mildly thickened leaflets . - Left atrium: The atrium was mildly dilated.  Recent Labs: 08/17/2020: B Natriuretic Peptide 156.5; Magnesium 1.9; TSH <0.010 08/20/2020: ALT 24; BUN 11; Creatinine, Ser 0.83; Hemoglobin 12.4; Platelets 245; Potassium 3.5; Sodium 142   08/07/16: Sodium 141, potassium 4.0, BUN 22, creatinine 0.92  Lipid Panel    Component Value Date/Time   CHOL 187 10/10/2015 0933   TRIG 138 08/17/2020 1135   HDL 36 (L) 10/10/2015 0933   CHOLHDL 5.2 (H) 10/10/2015 0933   VLDL 63 (H) 10/10/2015 0933   LDLCALC 88 10/10/2015 0933   LDLDIRECT 118.0 11/09/2014 0853     04/03/17: Sodium 142, potassium 4.7, BUN 21, creatinine 1.01 AST 28, ALT 35 Total cholesterol 177, triglycerides 164, HDL 41, LDL 103  Wt Readings from Last 3 Encounters:  12/13/20 171 lb 3.2 oz (77.7 kg)  09/12/20 165 lb (74.8 kg)  09/07/20 164 lb (74.4 kg)      ASSESSMENT AND PLAN:  # Aortic sclerosis:  [name]  Was  previously noted to have a systolic murmur on  exam and was referred for an echo. No aortic stenosis was seen but she does have a narrow LVOT. Most recent echo was notable for apical hypertrophy.  There was no outflow gradient noted and she has no murmur on exam today.  # Hypertension: Blood pressure remains well-controlled.  Continue amlodipine, metoprolol, and spironolactone.  # Coronary calcification:  # Hyperlipidemia:  Coronary calcifications were noted in the hospital.  She had a Lexiscan Myoview that was negative for ischemia 09/2020.  Lipids are greater than 70.  We will stop the fenofibrate and start rosuvastatin 20 mg daily.  Check fasting lipids and a CMP in 3 months.  Continue metoprolol.  She was encouraged to start back exercising.  # Apical hypertrophy: There was a question of apical variant hypertrophic cardiomyopathy on her echocardiogram.  She is asymptomatic.  She is already on metoprolol and there is no gradient on exam.  She would be a candidate for an ICD.  There is no gradient mentioned in her echo.  The utility of further testing is unclear.  She does not have any children or siblings.  Therefore continue with medical management.  Consider cardiac MRI should the need arise.   Current medicines are reviewed at length with the patient today.  The patient does not have concerns regarding medicines.  The following changes have been made:  no change  Labs/ tests ordered today include:   Orders Placed This Encounter  Procedures  . Lipid panel  . Comprehensive metabolic panel  . EKG 12-Lead     Disposition:   FU with Jud Fanguy C. Oval Linsey, MD, [facility] in 6 months   This note was written with the assistance of speech recognition software.  Please excuse any transcriptional errors.  Signed, Lylith Bebeau C. Oval Linsey, MD, [facility]  12/13/2020 12:56 PM    [facility]

## 2020-12-13 NOTE — Patient Instructions (Addendum)
Medication Instructions:  STOP FENOFIBRATE  START ROSUVASTATIN 20 MG DAILY   *If you need a refill on your cardiac medications before your next appointment, please call your pharmacy*  Lab Work: FASTING LP/CMET IN 3 MONTHS   If you have labs (blood work) drawn today and your tests are completely normal, you will receive your results only by: Marland Kitchen MyChart Message (if you have MyChart) OR . A paper copy in the mail If you have any lab test that is abnormal or we need to change your treatment, we will call you to review the results.  Testing/Procedures: NONE  Follow-Up: At [facility], you and your health needs are our priority.  As part of our continuing mission to provide you with exceptional heart care, we have created designated Provider Care Teams.  These Care Teams include your primary Cardiologist (physician) and Advanced Practice Providers (APPs -  Physician Assistants and Nurse Practitioners) who all work together to provide you with the care you need, when you need it.  We recommend signing up for the patient portal called "MyChart".  Sign up information is provided on this After Visit Summary.  MyChart is used to connect with patients for Virtual Visits (Telemedicine).  Patients are able to view lab/test results, encounter notes, upcoming appointments, etc.  Non-urgent messages can be sent to your provider as well.   To learn more about what you can do with MyChart, go to [email or web address].    Your next appointment:   6 month(s)  The format for your next appointment:   In Person  Provider:   You may see Chilton Si, MD or one of the following Advanced Practice Providers on your designated Care Team:    Corine Shelter, PA-C  [place], [place]  Edd Fabian, [place]

## 2020-12-17 DIAGNOSIS — M25562 Pain in left knee: Secondary | ICD-10-CM | POA: Diagnosis not present

## 2020-12-17 DIAGNOSIS — Z20828 Contact with and (suspected) exposure to other viral communicable diseases: Secondary | ICD-10-CM | POA: Diagnosis not present

## 2020-12-17 DIAGNOSIS — M25611 Stiffness of right shoulder, not elsewhere classified: Secondary | ICD-10-CM | POA: Diagnosis not present

## 2020-12-17 DIAGNOSIS — M25511 Pain in right shoulder: Secondary | ICD-10-CM | POA: Diagnosis not present

## 2020-12-17 DIAGNOSIS — Z1159 Encounter for screening for other viral diseases: Secondary | ICD-10-CM | POA: Diagnosis not present

## 2020-12-17 DIAGNOSIS — M5459 Other low back pain: Secondary | ICD-10-CM | POA: Diagnosis not present

## 2020-12-19 DIAGNOSIS — M5459 Other low back pain: Secondary | ICD-10-CM | POA: Diagnosis not present

## 2020-12-19 DIAGNOSIS — M25611 Stiffness of right shoulder, not elsewhere classified: Secondary | ICD-10-CM | POA: Diagnosis not present

## 2020-12-19 DIAGNOSIS — M25511 Pain in right shoulder: Secondary | ICD-10-CM | POA: Diagnosis not present

## 2020-12-19 DIAGNOSIS — M25562 Pain in left knee: Secondary | ICD-10-CM | POA: Diagnosis not present

## 2020-12-20 ENCOUNTER — Other Ambulatory Visit: Payer: Self-pay | Admitting: Radiology

## 2020-12-20 DIAGNOSIS — R921 Mammographic calcification found on diagnostic imaging of breast: Secondary | ICD-10-CM | POA: Diagnosis not present

## 2020-12-20 DIAGNOSIS — D0512 Intraductal carcinoma in situ of left breast: Secondary | ICD-10-CM | POA: Diagnosis not present

## 2020-12-21 ENCOUNTER — Other Ambulatory Visit: Payer: Self-pay | Admitting: *Deleted

## 2020-12-21 ENCOUNTER — Telehealth: Payer: Self-pay | Admitting: *Deleted

## 2020-12-21 DIAGNOSIS — D0512 Intraductal carcinoma in situ of left breast: Secondary | ICD-10-CM

## 2020-12-21 NOTE — Telephone Encounter (Signed)
Received referral for DR. Magrinat to discuss COMET trial. Scheduled appt for 1/25 at 4pm.

## 2020-12-24 DIAGNOSIS — Z20828 Contact with and (suspected) exposure to other viral communicable diseases: Secondary | ICD-10-CM | POA: Diagnosis not present

## 2020-12-24 DIAGNOSIS — Z1159 Encounter for screening for other viral diseases: Secondary | ICD-10-CM | POA: Diagnosis not present

## 2020-12-25 ENCOUNTER — Encounter: Payer: Self-pay | Admitting: Geriatric Medicine

## 2020-12-25 DIAGNOSIS — M25562 Pain in left knee: Secondary | ICD-10-CM | POA: Diagnosis not present

## 2020-12-25 DIAGNOSIS — M5459 Other low back pain: Secondary | ICD-10-CM | POA: Diagnosis not present

## 2020-12-26 DIAGNOSIS — M25511 Pain in right shoulder: Secondary | ICD-10-CM | POA: Diagnosis not present

## 2020-12-26 DIAGNOSIS — M25562 Pain in left knee: Secondary | ICD-10-CM | POA: Diagnosis not present

## 2020-12-26 DIAGNOSIS — M25611 Stiffness of right shoulder, not elsewhere classified: Secondary | ICD-10-CM | POA: Diagnosis not present

## 2020-12-26 DIAGNOSIS — M5459 Other low back pain: Secondary | ICD-10-CM | POA: Diagnosis not present

## 2020-12-28 DIAGNOSIS — M25611 Stiffness of right shoulder, not elsewhere classified: Secondary | ICD-10-CM | POA: Diagnosis not present

## 2020-12-28 DIAGNOSIS — M25562 Pain in left knee: Secondary | ICD-10-CM | POA: Diagnosis not present

## 2020-12-28 DIAGNOSIS — M25511 Pain in right shoulder: Secondary | ICD-10-CM | POA: Diagnosis not present

## 2020-12-28 DIAGNOSIS — M5459 Other low back pain: Secondary | ICD-10-CM | POA: Diagnosis not present

## 2020-12-31 ENCOUNTER — Encounter: Payer: Self-pay | Admitting: Oncology

## 2020-12-31 DIAGNOSIS — M25511 Pain in right shoulder: Secondary | ICD-10-CM | POA: Diagnosis not present

## 2020-12-31 DIAGNOSIS — Z20828 Contact with and (suspected) exposure to other viral communicable diseases: Secondary | ICD-10-CM | POA: Diagnosis not present

## 2020-12-31 DIAGNOSIS — M25611 Stiffness of right shoulder, not elsewhere classified: Secondary | ICD-10-CM | POA: Diagnosis not present

## 2020-12-31 DIAGNOSIS — M25562 Pain in left knee: Secondary | ICD-10-CM | POA: Diagnosis not present

## 2020-12-31 DIAGNOSIS — Z1159 Encounter for screening for other viral diseases: Secondary | ICD-10-CM | POA: Diagnosis not present

## 2020-12-31 DIAGNOSIS — M5459 Other low back pain: Secondary | ICD-10-CM | POA: Diagnosis not present

## 2020-12-31 NOTE — Progress Notes (Signed)
[place]  Telephone:([phone]) [phone] Fax:(336) [phone]     ID: [name] DOB: 1941-03-18  MR#: [id number]  PZW#:[id number]  Patient Care Team: [name], MD as PCP - General (Internal Medicine) Pieter Partridge, DO as Consulting Physician (Neurology) Delsa Bern, MD as Consulting Physician (Obstetrics and Gynecology) [place], Virgie Dad, MD as Consulting Physician (Oncology) Skeet Latch, MD as Attending Physician (Cardiology) Wonda Horner, MD as Consulting Physician (Gastroenterology) Chucky May, MD as Consulting Physician (Psychiatry) Latanya Maudlin, MD as Consulting Physician (Orthopedic Surgery) Rolm Bookbinder, MD as Consulting Physician (Dermatology) Veatrice Bourbon, RT as Technician (Radiology) Chauncey Cruel, MD OTHER MD:  CHIEF COMPLAINT: noninvasive breast cancer, estrogen receptor positive  CURRENT TREATMENT: Observation   HISTORY OF CURRENT ILLNESS: [name] has a history of right breast invasive lobular carcinoma, for which she underwent lumpectomy and radiation in 10/2009.  She did not receive chemo and she did not tolerate antiestrogens.  I followed the patient, and she was subsequently released from follow up in 05/2014. See full history below.  More recently, she had routine screening mammography on 11/29/2020 showing a possible abnormality in the left breast. She underwent left diagnostic mammography with tomography at [facility] on 12/06/2020 showing: breast density category B; 1 cm grouped fine punctate calcifications in upper-outer left breast.  Accordingly on 12/20/2020 she proceeded to biopsy of the left breast area in question.  Post procedure mammography showed the clip at the targeted area.  The pathology from this procedure ([id number]) showed: ductal carcinoma in situ, low grade, with calcifications. Prognostic indicators significant for: estrogen receptor, >95% positive with strong staining intensity and progesterone  receptor, 90% positive with moderate-strong staining intensity.  Cancer Staging Ductal carcinoma in situ (DCIS) of left breast Staging form: Breast, AJCC 8th Edition - Clinical: Stage 0 (cTis (DCIS), cN0, cM0, ER+, PR+) - Signed by Chauncey Cruel, MD on 01/01/2021  Malignant neoplasm of overlapping sites of right breast in female, estrogen receptor positive ([place]) Staging form: Breast, AJCC 8th Edition - Clinical: No Stage Recommended (ycT1c, cN0, cM0, G1, ER+, PR+, HER2-) - Signed by Chauncey Cruel, MD on 01/01/2021   The patient's subsequent history is as detailed below.   INTERVAL HISTORY: [name] was evaluated in the breast cancer clinic on 01/01/2021 accompanied by her friend [name].   REVIEW OF SYSTEMS: There were no specific symptoms leading to the original mammogram, which was routinely scheduled.  Blayklee tells me her biggest problem is her knees.  She is followed by [name] for this.  She has been told she is not a surgical candidate.  This significantly limits her activity.  She uses a Rollator to get around.  She also had some swallowing difficulties and underwent a Heller myotomy at [facility] May 2029.  This resulted in a right pleural effusion which required thoracentesis x2 at [facility].   COVID 19 VACCINATION STATUS: Moderna x2 with booster in July.   RIGHT BREAST CANCER HISTORY: From the original intake note:  Ms. Buikema had routine screening mammography at the [facility] on September 06, 2009. There was a possible abnormality in the upper outer quadrant of the right breast so she was brought back for diagnostic studies September 12, 2009.  Magnification views confirmed a 1.4 spiculated mass in the upper outer quadrant of the right breast with a few microcalcifications associated with it.  By ultrasound, this measured 1.5 cm and was highly suggestive of malignancy.  Biopsy was performed the same day  and showed ([id number] and [phone]) an invasive lobular  carcinoma which was 92% ER and 44% PR positive.  The proliferation marker was 13%.  The tumor did not overexpress Her-2 with a ratio of 1.31.    With this information, the patient was referred to Dr. Brantley Stage and after appropriate discussion, she underwent definitive right lumpectomy and axillary sentinel lymph node sampling October 17, 2009.  The final pathology there ([id number]) showed a 1.5 cm invasive lobular carcinoma, grade 1, with negative though closed margins (the in situ component was at 1 mm from the superior, inferior and medial margins) with no evidence of lymphovascular invasion and the single sentinel lymph node clear.  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   Her subsequent history is as detailed below.    PAST MEDICAL HISTORY: Past Medical History:  Diagnosis Date  . Anxiety   . Arrhythmia    right bundle branch block  . Bipolar 1 disorder ([place])   . Cancer ([place])   . Hyperlipidemia   . Hypertension   . Morbid obesity ([place])   . Osteoarthritis   . Schizo-affective schizophrenia ([place])   . Thyroid disease    hypothyroidism  History of: dilated cardiomyopathy, bundle branch block, heart murmur, rosacea, psychiatric hospitalization in 1999 for bipolar disorder, chronic schizophrenia, and early Alzheimer's disease   PAST SURGICAL HISTORY: Past Surgical History:  Procedure Laterality Date  . BREAST SURGERY    . CHOLECYSTECTOMY    . DILATION AND CURETTAGE OF UTERUS    . ESOPHAGEAL MANOMETRY N/A 01/29/2018   Procedure: ESOPHAGEAL MANOMETRY (EM);  Surgeon: Wonda Horner, MD;  Location: [facility];  Service: Endoscopy;  Laterality: N/A;  . HAND SURGERY     Right-trigger finger  . IR THORACENTESIS ASP PLEURAL SPACE W/IMG GUIDE  06/28/2018  . IR THORACENTESIS ASP PLEURAL SPACE W/IMG GUIDE  07/19/2018  . KNEE SURGERY     Left  . TONSILLECTOMY      FAMILY HISTORY: Family History  Problem Relation Age of Onset  . Hypertension Mother   . Lung cancer Brother   .  Schizophrenia Brother    The patient's mother died from pneumonia at the age of 88 in the setting of Parkinson's disease.  The patient's father died at the age of 43.  The patient's only sibling was a brother who died from lung cancer in his fifties.    She is not aware of any breast ovarian or prostate cancer in the family  GYNECOLOGIC HISTORY:  No LMP recorded. Patient is postmenopausal. Menarche: 80 years old [name] 0 LMP ~age 47 Contraceptive HRT used for about 2 years  Hysterectomy? no BSO? 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  She is now retired.  She has lived in [place] about 30 years.  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  She is divorced. She lives in [place].  She attends [employer]    ADVANCED DIRECTIVES: The patient has named [name] as her healthcare power of attorney   HEALTH MAINTENANCE: Social History   Tobacco Use  . Smoking status: Former Smoker    Quit date: 12/08/1978    Years since quitting: 42.0  . Smokeless tobacco: Never Used  Substance Use Topics  . Alcohol use: Yes    Comment:  1 time a year  . Drug use: No      Allergies  Allergen Reactions  . Lithium Nausea Only  . Fluoxetine Other (See Comments)    Pt felt crazy  . Macrodantin Other (See Comments)    unknown  . Mirabegron Other (See Comments)    ineffective  . Paroxetine Hcl     Other reaction(s): Other (See Comments) Made pt feel crazy  . Paxil [Paroxetine] Other (See Comments)    Made pt feel crazy   . Prednisone     Makes her feel very jittery  . Prozac [Fluoxetine Hcl] Other (See Comments)    Pt felt crazy   . Sertraline Hcl   . Solifenacin Other (See Comments)    Ineffective   . Sulfa Antibiotics Other (See Comments)    unknown  . Wellbutrin  [Bupropion Hcl] Other (See Comments)    unknown    Current Outpatient Medications  Medication Sig Dispense Refill  . acetaminophen (TYLENOL) 500 MG tablet 1 tablet as needed    . ALPRAZolam (XANAX) 0.5 MG tablet Take 1 mg by mouth 3 (three) times daily as needed for anxiety.     Marland Kitchen amLODipine (NORVASC) 2.5 MG tablet Take 2.5 mg by mouth daily.    Marland Kitchen CALCIUM PO Take 2 tablets by mouth daily.     . cholecalciferol (VITAMIN D3) 25 MCG (1000 UT) tablet Take 1,000 Units by mouth daily. Wasn't sure if 1000 or 2000 units daily    . Cyanocobalamin (VITAMIN B-12 PO) Take 1 tablet by mouth daily.     Marland Kitchen gabapentin (NEURONTIN) 100 MG capsule TAKE 2 CAPSULES (200 MG TOTAL) BY MOUTH AT BEDTIME. 60 capsule 7  . lamoTRIgine (LAMICTAL) 150 MG tablet Take 150 mg by mouth every evening.   12  . levothyroxine (SYNTHROID) 137 MCG tablet Take 137 mcg by mouth daily before breakfast.    . memantine (NAMENDA) 10 MG tablet Take 10 mg by mouth 2 (two) times daily.    . metoprolol tartrate (LOPRESSOR) 25 MG tablet Take 25 mg by mouth 2 (two) times daily.    . Multiple Vitamins-Minerals (MULTIVITAMIN ADULT PO) Take 1 tablet by mouth daily.    Marland Kitchen OLANZapine (ZYPREXA) 15 MG tablet Take 30 mg by mouth at bedtime.    . Omega-3 Fatty Acids (FISH OIL) 1000 MG CAPS Take 1,000 mg by mouth daily.    Marland Kitchen omeprazole (PRILOSEC) 40 MG capsule Take 40 mg by mouth daily.    . QUEtiapine (SEROQUEL) 25 MG tablet Take 50 mg by mouth at bedtime.     . ramelteon (ROZEREM) 8 MG tablet Take 8 mg by mouth at bedtime as needed for sleep.    . rosuvastatin (CRESTOR) 20 MG tablet Take 1 tablet (20 mg total) by mouth daily. 90 tablet 3  . spironolactone (ALDACTONE) 25 MG tablet Take 1 tablet (25 mg total) by mouth daily. 90 tablet 3  . HYDROcodone-acetaminophen (NORCO/VICODIN) 5-325 MG tablet 1 tablet as needed (Patient not taking: Reported on 01/01/2021)     No current facility-administered medications for this visit.    OBJECTIVE: White woman  who appears younger than stated age  60:   01/01/21 1550  BP: (!) 193/87  Pulse: (!) 117  Resp: 18  Temp: 97.9 F (36.6 C)  SpO2: 95%     Body mass index is 30.59 kg/m.   Wt Readings from Last 3 Encounters:  01/01/21 172 lb 11.2 oz (78.3 kg)  12/13/20 171 lb 3.2 oz (77.7  kg)  09/12/20 165 lb (74.8 kg)      ECOG FS:2 - Symptomatic, <50% confined to bed  Ocular: Sclerae unicteric, pupils round and equal Ear-nose-throat: Wearing a mask Lymphatic: No cervical or supraclavicular adenopathy Lungs no rales or rhonchi Heart regular rate and rhythm Abd soft, nontender, positive bowel sounds MSK no focal spinal tenderness to mild palpation Neuro: non-focal, well-oriented, slightly flat but appropriate affect Breasts: The right breast is status post remote lumpectomy and radiation.  It is smaller than the left and there is some change to the contour but there is no evidence of local recurrence.  The left breast is status post recent biopsy.  There is a small ecchymosis.  There is no palpable mass.  Both axillae are benign.   LAB RESULTS:  CMP     Component Value Date/Time   NA 142 01/01/2021 1508   NA 142 05/11/2014 0911   K 4.4 01/01/2021 1508   K 4.1 05/11/2014 0911   CL 107 01/01/2021 1508   CL 105 05/12/2013 1452   CO2 24 01/01/2021 1508   CO2 19 (L) 05/11/2014 0911   GLUCOSE 118 (H) 01/01/2021 1508   GLUCOSE 106 05/11/2014 0911   GLUCOSE 100 (H) 05/12/2013 1452   BUN 19 01/01/2021 1508   BUN 14.9 05/11/2014 0911   CREATININE 0.79 01/01/2021 1508   CREATININE 0.89 10/10/2015 0933   CREATININE 0.9 05/11/2014 0911   CALCIUM 9.7 01/01/2021 1508   CALCIUM 9.9 05/11/2014 0911   PROT 7.2 01/01/2021 1508   PROT 6.8 05/11/2014 0911   ALBUMIN 3.7 01/01/2021 1508   ALBUMIN 3.9 05/11/2014 0911   AST 18 01/01/2021 1508   AST 37 (H) 05/11/2014 0911   ALT 33 01/01/2021 1508   ALT 40 05/11/2014 0911   ALKPHOS 37 (L) 01/01/2021 1508   ALKPHOS 52 05/11/2014 0911   BILITOT  0.3 01/01/2021 1508   BILITOT 0.33 05/11/2014 0911   GFRNONAA >60 01/01/2021 1508   GFRAA >60 08/20/2020 0333    No results found for: TOTALPROTELP, ALBUMINELP, A1GS, A2GS, BETS, BETA2SER, GAMS, MSPIKE, SPEI  Lab Results  Component Value Date   WBC 9.1 01/01/2021   NEUTROABS 5.5 01/01/2021   HGB 14.4 01/01/2021   HCT 41.5 01/01/2021   MCV 95.0 01/01/2021   PLT 258 01/01/2021    Lab Results  Component Value Date   LABCA2 12 04/01/2012    No components found for: IWLNLG921  No results for input(s): INR in the last 168 hours.  Lab Results  Component Value Date   LABCA2 12 04/01/2012    No results found for: JHE174  No results found for: YCX448  No results found for: JEH631  No results found for: CA2729  No components found for: HGQUANT  No results found for: CEA1 / No results found for: CEA1   No results found for: AFPTUMOR  No results found for: CHROMOGRNA  No results found for: KPAFRELGTCHN, LAMBDASER, KAPLAMBRATIO (kappa/lambda light chains)  No results found for: HGBA, HGBA2QUANT, HGBFQUANT, HGBSQUAN (Hemoglobinopathy evaluation)   Lab Results  Component Value Date   LDH 207 (H) 08/17/2020    No results found for: IRON, TIBC, IRONPCTSAT (Iron and TIBC)  Lab Results  Component Value Date   FERRITIN 97 08/17/2020    Urinalysis    Component Value Date/Time   COLORURINE YELLOW 08/18/2020 0018   APPEARANCEUR CLEAR 08/18/2020 0018   LABSPEC 1.020 08/18/2020 0018   PHURINE 6.0 08/18/2020 0018   GLUCOSEU NEGATIVE 08/18/2020 0018   HGBUR  NEGATIVE 08/18/2020 0018   BILIRUBINUR NEGATIVE 08/18/2020 0018   KETONESUR NEGATIVE 08/18/2020 0018   PROTEINUR NEGATIVE 08/18/2020 0018   NITRITE NEGATIVE 08/18/2020 0018   LEUKOCYTESUR NEGATIVE 08/18/2020 0018    STUDIES: No results found.   ELIGIBLE FOR AVAILABLE RESEARCH PROTOCOL: no  ASSESSMENT: 80 y.o. [place] woman with  RIGHT BREAST CANCER:  (1) status post right lumpectomy and sentinel  lymph node sampling in November 2010 for a T1cN0, stage IA invasive lobular carcinoma, grade 1, strongly estrogen and progesterone receptor positive, HER-2/neu negative, with low MIB-1.    (2) Status post ADJUVANT radiation therapy, completed in February 2011.  (3) Did not tolerate aromatase anastrozole or letrozole and WAs not felt to be a candidate for tamoxifen--opted for observation alone and was discharged from follow-up 2015  LEFT BREAST CANCER (4) status post left breast biopsy 12/20/2020 for a clinically 1 cm ductal carcinoma in situ, grade 1, strongly estrogen and progesterone receptor positive  (5) refused standard of care appendectomy, opted for observation alone  (6) she will have bi-annual mammograms in May and November indefinitely   PLAN: I met today with [name] to review her new diagnosis. Specifically we discussed the biology of her breast cancer, its diagnosis, staging, treatment  options and prognosis.  Lacy understands that in noninvasive ductal carcinoma, also called ductal carcinoma in situ ("DCIS") the breast cancer cells remain trapped in the ducts were they started. They cannot travel to a vital organ. For that reason these cancers in themselves are not life-threatening.  If the whole breast is removed then all the ducts are removed and since the cancer cells are trapped in the ducts, the cure rate with mastectomy for noninvasive breast cancer is approximately 99%. Nevertheless the standard of care is lumpectomy, because there is no survival advantage to mastectomy and because the cosmetic result is generally superior with breast conservation.  If the patient keeps her breasts, there will be some risk of recurrence. The recurrence can only be in the same breast since, again, the cells are trapped in the ducts. There is no connection from one breast to the other. The risk of local recurrence is cut by more than half with radiation, which is standard in this  situation.  In estrogen receptor positive cancers like [name]'s, anti-estrogens can also be considered. They will further reduce the risk of recurrence by one half. In addition anti-estrogens will lower the risk of a new breast cancer developing in either breast, also by one half. That risk otherwise approaches 1% per year.   After this discussion Finnleigh made it clear that she really did not want to pursue standard of care.  She was not able to tolerate aromatase inhibitors in the past.  She prefers not to undergo surgery, radiation, or antiestrogens and instead to simply be observed.  We then discussed the COMET trial.  She is not a candidate because she would not agree to surgery if randomized, but we can follow the pattern of that trial and she would receive mammography twice yearly.  She understands that this can be uncomfortable, and may lead to multiple future biopsies.  She also understands that we do not really know what happens if you simply observe ductal carcinoma in situ--that is precisely what the COMET trial is trying to find out.  It is true that in her case we are dealing with a grade 1 process and it is also true that she will be 80 years old May of this year, with  multiple comorbidities.  The plan then is for biannual mammography.  I am setting her up for mammography in May at [facility] and we will have a virtual visit following that.  She would then have a second mammogram in November and we would have an in person visit at that time  Total encounter time, 65 minutes.Sarajane Jews C. Ermine Spofford, MD 01/01/2021 5:20 PM Medical Oncology and Hematology [facility] [place], [place] [address] Tel. [phone]    Fax. [phone]   This document serves as a record of services personally performed by Lurline Del, MD. It was created on his behalf by Wilburn Mylar, a trained medical scribe. The creation of this record is based on the scribe's personal  observations and the provider's statements to them.   I, Lurline Del MD, have reviewed the above documentation for accuracy and completeness, and I agree with the above.    *Total Encounter Time as defined by the Centers for Medicare and Medicaid Services includes, in addition to the face-to-face time of a patient visit (documented in the note above) non-face-to-face time: obtaining and reviewing outside history, ordering and reviewing medications, tests or procedures, care coordination (communications with other health care professionals or caregivers) and documentation in the medical record.

## 2021-01-01 ENCOUNTER — Inpatient Hospital Stay (HOSPITAL_BASED_OUTPATIENT_CLINIC_OR_DEPARTMENT_OTHER): Payer: Medicare HMO | Admitting: Oncology

## 2021-01-01 ENCOUNTER — Inpatient Hospital Stay: Payer: Medicare HMO | Attending: Oncology

## 2021-01-01 ENCOUNTER — Other Ambulatory Visit: Payer: Self-pay

## 2021-01-01 VITALS — BP 193/87 | HR 117 | Temp 97.9°F | Resp 18 | Ht 63.0 in | Wt 172.7 lb

## 2021-01-01 DIAGNOSIS — I10 Essential (primary) hypertension: Secondary | ICD-10-CM | POA: Insufficient documentation

## 2021-01-01 DIAGNOSIS — D0512 Intraductal carcinoma in situ of left breast: Secondary | ICD-10-CM

## 2021-01-01 DIAGNOSIS — I420 Dilated cardiomyopathy: Secondary | ICD-10-CM | POA: Diagnosis not present

## 2021-01-01 DIAGNOSIS — F319 Bipolar disorder, unspecified: Secondary | ICD-10-CM

## 2021-01-01 DIAGNOSIS — F0280 Dementia in other diseases classified elsewhere without behavioral disturbance: Secondary | ICD-10-CM | POA: Insufficient documentation

## 2021-01-01 DIAGNOSIS — C50912 Malignant neoplasm of unspecified site of left female breast: Secondary | ICD-10-CM | POA: Diagnosis not present

## 2021-01-01 DIAGNOSIS — C50911 Malignant neoplasm of unspecified site of right female breast: Secondary | ICD-10-CM | POA: Diagnosis not present

## 2021-01-01 DIAGNOSIS — M1990 Unspecified osteoarthritis, unspecified site: Secondary | ICD-10-CM | POA: Diagnosis not present

## 2021-01-01 DIAGNOSIS — Z170 Estrogen receptor positive status [ER+]: Secondary | ICD-10-CM

## 2021-01-01 DIAGNOSIS — E785 Hyperlipidemia, unspecified: Secondary | ICD-10-CM | POA: Diagnosis not present

## 2021-01-01 DIAGNOSIS — E039 Hypothyroidism, unspecified: Secondary | ICD-10-CM | POA: Insufficient documentation

## 2021-01-01 DIAGNOSIS — Z87891 Personal history of nicotine dependence: Secondary | ICD-10-CM | POA: Insufficient documentation

## 2021-01-01 DIAGNOSIS — F209 Schizophrenia, unspecified: Secondary | ICD-10-CM | POA: Insufficient documentation

## 2021-01-01 DIAGNOSIS — I5032 Chronic diastolic (congestive) heart failure: Secondary | ICD-10-CM | POA: Diagnosis not present

## 2021-01-01 DIAGNOSIS — G309 Alzheimer's disease, unspecified: Secondary | ICD-10-CM | POA: Insufficient documentation

## 2021-01-01 DIAGNOSIS — Z79899 Other long term (current) drug therapy: Secondary | ICD-10-CM | POA: Diagnosis not present

## 2021-01-01 DIAGNOSIS — C50811 Malignant neoplasm of overlapping sites of right female breast: Secondary | ICD-10-CM | POA: Diagnosis not present

## 2021-01-01 LAB — CMP (CANCER CENTER ONLY)
ALT: 33 U/L (ref 0–44)
AST: 18 U/L (ref 15–41)
Albumin: 3.7 g/dL (ref 3.5–5.0)
Alkaline Phosphatase: 37 U/L — ABNORMAL LOW (ref 38–126)
Anion gap: 11 (ref 5–15)
BUN: 19 mg/dL (ref 8–23)
CO2: 24 mmol/L (ref 22–32)
Calcium: 9.7 mg/dL (ref 8.9–10.3)
Chloride: 107 mmol/L (ref 98–111)
Creatinine: 0.79 mg/dL (ref 0.44–1.00)
GFR, Estimated: >60 mL/min (ref >60)
Glucose, Bld: 118 mg/dL — ABNORMAL HIGH (ref 70–99)
Potassium: 4.4 mmol/L (ref 3.5–5.1)
Sodium: 142 mmol/L (ref 135–145)
Total Bilirubin: 0.3 mg/dL (ref 0.3–1.2)
Total Protein: 7.2 g/dL (ref 6.5–8.1)

## 2021-01-01 LAB — CBC WITH DIFFERENTIAL (CANCER CENTER ONLY)
Abs Immature Granulocytes: 0.13 10*3/uL — ABNORMAL HIGH (ref 0.00–0.07)
Basophils Absolute: 0.1 10*3/uL (ref 0.0–0.1)
Basophils Relative: 1 %
Eosinophils Absolute: 0.1 10*3/uL (ref 0.0–0.5)
Eosinophils Relative: 1 %
HCT: 41.5 % (ref 36.0–46.0)
Hemoglobin: 14.4 g/dL (ref 12.0–15.0)
Immature Granulocytes: 1 %
Lymphocytes Relative: 26 %
Lymphs Abs: 2.4 10*3/uL (ref 0.7–4.0)
MCH: 33 pg (ref 26.0–34.0)
MCHC: 34.7 g/dL (ref 30.0–36.0)
MCV: 95 fL (ref 80.0–100.0)
Monocytes Absolute: 1 10*3/uL (ref 0.1–1.0)
Monocytes Relative: 10 %
Neutro Abs: 5.5 10*3/uL (ref 1.7–7.7)
Neutrophils Relative %: 61 %
Platelet Count: 258 10*3/uL (ref 150–400)
RBC: 4.37 MIL/uL (ref 3.87–5.11)
RDW: 13.1 % (ref 11.5–15.5)
WBC Count: 9.1 10*3/uL (ref 4.0–10.5)
nRBC: 0 % (ref 0.0–0.2)

## 2021-01-07 DIAGNOSIS — M25511 Pain in right shoulder: Secondary | ICD-10-CM | POA: Diagnosis not present

## 2021-01-07 DIAGNOSIS — Z1159 Encounter for screening for other viral diseases: Secondary | ICD-10-CM | POA: Diagnosis not present

## 2021-01-07 DIAGNOSIS — M5459 Other low back pain: Secondary | ICD-10-CM | POA: Diagnosis not present

## 2021-01-07 DIAGNOSIS — Z20828 Contact with and (suspected) exposure to other viral communicable diseases: Secondary | ICD-10-CM | POA: Diagnosis not present

## 2021-01-07 DIAGNOSIS — M25611 Stiffness of right shoulder, not elsewhere classified: Secondary | ICD-10-CM | POA: Diagnosis not present

## 2021-01-07 DIAGNOSIS — M25562 Pain in left knee: Secondary | ICD-10-CM | POA: Diagnosis not present

## 2021-01-14 DIAGNOSIS — Z20828 Contact with and (suspected) exposure to other viral communicable diseases: Secondary | ICD-10-CM | POA: Diagnosis not present

## 2021-01-14 DIAGNOSIS — Z1159 Encounter for screening for other viral diseases: Secondary | ICD-10-CM | POA: Diagnosis not present

## 2021-01-16 DIAGNOSIS — M25562 Pain in left knee: Secondary | ICD-10-CM | POA: Diagnosis not present

## 2021-01-18 DIAGNOSIS — H18513 Endothelial corneal dystrophy, bilateral: Secondary | ICD-10-CM | POA: Diagnosis not present

## 2021-01-18 DIAGNOSIS — H40013 Open angle with borderline findings, low risk, bilateral: Secondary | ICD-10-CM | POA: Diagnosis not present

## 2021-01-21 DIAGNOSIS — Z20828 Contact with and (suspected) exposure to other viral communicable diseases: Secondary | ICD-10-CM | POA: Diagnosis not present

## 2021-01-21 DIAGNOSIS — Z1159 Encounter for screening for other viral diseases: Secondary | ICD-10-CM | POA: Diagnosis not present

## 2021-01-23 DIAGNOSIS — I8391 Asymptomatic varicose veins of right lower extremity: Secondary | ICD-10-CM | POA: Diagnosis not present

## 2021-01-23 DIAGNOSIS — Z85828 Personal history of other malignant neoplasm of skin: Secondary | ICD-10-CM | POA: Diagnosis not present

## 2021-01-23 DIAGNOSIS — L820 Inflamed seborrheic keratosis: Secondary | ICD-10-CM | POA: Diagnosis not present

## 2021-01-23 DIAGNOSIS — L821 Other seborrheic keratosis: Secondary | ICD-10-CM | POA: Diagnosis not present

## 2021-01-23 DIAGNOSIS — L308 Other specified dermatitis: Secondary | ICD-10-CM | POA: Diagnosis not present

## 2021-01-23 DIAGNOSIS — D692 Other nonthrombocytopenic purpura: Secondary | ICD-10-CM | POA: Diagnosis not present

## 2021-01-23 DIAGNOSIS — I8392 Asymptomatic varicose veins of left lower extremity: Secondary | ICD-10-CM | POA: Diagnosis not present

## 2021-01-26 DIAGNOSIS — E039 Hypothyroidism, unspecified: Secondary | ICD-10-CM | POA: Diagnosis not present

## 2021-01-26 DIAGNOSIS — F319 Bipolar disorder, unspecified: Secondary | ICD-10-CM | POA: Diagnosis not present

## 2021-01-26 DIAGNOSIS — K219 Gastro-esophageal reflux disease without esophagitis: Secondary | ICD-10-CM | POA: Diagnosis not present

## 2021-01-26 DIAGNOSIS — I129 Hypertensive chronic kidney disease with stage 1 through stage 4 chronic kidney disease, or unspecified chronic kidney disease: Secondary | ICD-10-CM | POA: Diagnosis not present

## 2021-01-26 DIAGNOSIS — E782 Mixed hyperlipidemia: Secondary | ICD-10-CM | POA: Diagnosis not present

## 2021-01-26 DIAGNOSIS — G301 Alzheimer's disease with late onset: Secondary | ICD-10-CM | POA: Diagnosis not present

## 2021-01-26 DIAGNOSIS — N1831 Chronic kidney disease, stage 3a: Secondary | ICD-10-CM | POA: Diagnosis not present

## 2021-01-27 ENCOUNTER — Other Ambulatory Visit: Payer: Self-pay | Admitting: Cardiovascular Disease

## 2021-01-28 DIAGNOSIS — Z20828 Contact with and (suspected) exposure to other viral communicable diseases: Secondary | ICD-10-CM | POA: Diagnosis not present

## 2021-01-28 DIAGNOSIS — M25511 Pain in right shoulder: Secondary | ICD-10-CM | POA: Diagnosis not present

## 2021-01-28 DIAGNOSIS — M25611 Stiffness of right shoulder, not elsewhere classified: Secondary | ICD-10-CM | POA: Diagnosis not present

## 2021-01-28 DIAGNOSIS — M5459 Other low back pain: Secondary | ICD-10-CM | POA: Diagnosis not present

## 2021-01-28 DIAGNOSIS — M25562 Pain in left knee: Secondary | ICD-10-CM | POA: Diagnosis not present

## 2021-01-28 DIAGNOSIS — Z1159 Encounter for screening for other viral diseases: Secondary | ICD-10-CM | POA: Diagnosis not present

## 2021-02-04 DIAGNOSIS — Z20828 Contact with and (suspected) exposure to other viral communicable diseases: Secondary | ICD-10-CM | POA: Diagnosis not present

## 2021-02-04 DIAGNOSIS — Z1159 Encounter for screening for other viral diseases: Secondary | ICD-10-CM | POA: Diagnosis not present

## 2021-02-07 DIAGNOSIS — M25511 Pain in right shoulder: Secondary | ICD-10-CM | POA: Diagnosis not present

## 2021-02-07 DIAGNOSIS — M25611 Stiffness of right shoulder, not elsewhere classified: Secondary | ICD-10-CM | POA: Diagnosis not present

## 2021-02-07 DIAGNOSIS — M5459 Other low back pain: Secondary | ICD-10-CM | POA: Diagnosis not present

## 2021-02-07 DIAGNOSIS — M25562 Pain in left knee: Secondary | ICD-10-CM | POA: Diagnosis not present

## 2021-02-11 DIAGNOSIS — Z1159 Encounter for screening for other viral diseases: Secondary | ICD-10-CM | POA: Diagnosis not present

## 2021-02-11 DIAGNOSIS — Z20828 Contact with and (suspected) exposure to other viral communicable diseases: Secondary | ICD-10-CM | POA: Diagnosis not present

## 2021-02-13 DIAGNOSIS — M25511 Pain in right shoulder: Secondary | ICD-10-CM | POA: Diagnosis not present

## 2021-02-13 DIAGNOSIS — M5459 Other low back pain: Secondary | ICD-10-CM | POA: Diagnosis not present

## 2021-02-18 DIAGNOSIS — M1712 Unilateral primary osteoarthritis, left knee: Secondary | ICD-10-CM | POA: Diagnosis not present

## 2021-02-18 DIAGNOSIS — Z1159 Encounter for screening for other viral diseases: Secondary | ICD-10-CM | POA: Diagnosis not present

## 2021-02-18 DIAGNOSIS — M25562 Pain in left knee: Secondary | ICD-10-CM | POA: Diagnosis not present

## 2021-02-18 DIAGNOSIS — Z20828 Contact with and (suspected) exposure to other viral communicable diseases: Secondary | ICD-10-CM | POA: Diagnosis not present

## 2021-02-25 DIAGNOSIS — Z20828 Contact with and (suspected) exposure to other viral communicable diseases: Secondary | ICD-10-CM | POA: Diagnosis not present

## 2021-02-25 DIAGNOSIS — Z1159 Encounter for screening for other viral diseases: Secondary | ICD-10-CM | POA: Diagnosis not present

## 2021-03-01 DIAGNOSIS — M5416 Radiculopathy, lumbar region: Secondary | ICD-10-CM | POA: Diagnosis not present

## 2021-03-04 DIAGNOSIS — Z1159 Encounter for screening for other viral diseases: Secondary | ICD-10-CM | POA: Diagnosis not present

## 2021-03-04 DIAGNOSIS — Z20828 Contact with and (suspected) exposure to other viral communicable diseases: Secondary | ICD-10-CM | POA: Diagnosis not present

## 2021-03-06 DIAGNOSIS — G301 Alzheimer's disease with late onset: Secondary | ICD-10-CM | POA: Diagnosis not present

## 2021-03-06 DIAGNOSIS — K219 Gastro-esophageal reflux disease without esophagitis: Secondary | ICD-10-CM | POA: Diagnosis not present

## 2021-03-06 DIAGNOSIS — E039 Hypothyroidism, unspecified: Secondary | ICD-10-CM | POA: Diagnosis not present

## 2021-03-06 DIAGNOSIS — E782 Mixed hyperlipidemia: Secondary | ICD-10-CM | POA: Diagnosis not present

## 2021-03-06 DIAGNOSIS — I129 Hypertensive chronic kidney disease with stage 1 through stage 4 chronic kidney disease, or unspecified chronic kidney disease: Secondary | ICD-10-CM | POA: Diagnosis not present

## 2021-03-06 DIAGNOSIS — F319 Bipolar disorder, unspecified: Secondary | ICD-10-CM | POA: Diagnosis not present

## 2021-03-06 DIAGNOSIS — N1831 Chronic kidney disease, stage 3a: Secondary | ICD-10-CM | POA: Diagnosis not present

## 2021-03-07 DIAGNOSIS — C44722 Squamous cell carcinoma of skin of right lower limb, including hip: Secondary | ICD-10-CM | POA: Diagnosis not present

## 2021-03-07 DIAGNOSIS — D485 Neoplasm of uncertain behavior of skin: Secondary | ICD-10-CM | POA: Diagnosis not present

## 2021-03-07 DIAGNOSIS — Z85828 Personal history of other malignant neoplasm of skin: Secondary | ICD-10-CM | POA: Diagnosis not present

## 2021-03-11 DIAGNOSIS — Z1159 Encounter for screening for other viral diseases: Secondary | ICD-10-CM | POA: Diagnosis not present

## 2021-03-11 DIAGNOSIS — Z20828 Contact with and (suspected) exposure to other viral communicable diseases: Secondary | ICD-10-CM | POA: Diagnosis not present

## 2021-03-18 DIAGNOSIS — Z20828 Contact with and (suspected) exposure to other viral communicable diseases: Secondary | ICD-10-CM | POA: Diagnosis not present

## 2021-03-18 DIAGNOSIS — Z1159 Encounter for screening for other viral diseases: Secondary | ICD-10-CM | POA: Diagnosis not present

## 2021-03-21 DIAGNOSIS — M792 Neuralgia and neuritis, unspecified: Secondary | ICD-10-CM | POA: Diagnosis not present

## 2021-03-25 DIAGNOSIS — Z20828 Contact with and (suspected) exposure to other viral communicable diseases: Secondary | ICD-10-CM | POA: Diagnosis not present

## 2021-03-25 DIAGNOSIS — Z1159 Encounter for screening for other viral diseases: Secondary | ICD-10-CM | POA: Diagnosis not present

## 2021-03-25 DIAGNOSIS — I700 Atherosclerosis of aorta: Secondary | ICD-10-CM | POA: Diagnosis not present

## 2021-03-25 DIAGNOSIS — I129 Hypertensive chronic kidney disease with stage 1 through stage 4 chronic kidney disease, or unspecified chronic kidney disease: Secondary | ICD-10-CM | POA: Diagnosis not present

## 2021-03-25 DIAGNOSIS — N1831 Chronic kidney disease, stage 3a: Secondary | ICD-10-CM | POA: Diagnosis not present

## 2021-04-04 DIAGNOSIS — M792 Neuralgia and neuritis, unspecified: Secondary | ICD-10-CM | POA: Diagnosis not present

## 2021-04-04 DIAGNOSIS — M179 Osteoarthritis of knee, unspecified: Secondary | ICD-10-CM | POA: Diagnosis not present

## 2021-04-05 DIAGNOSIS — K219 Gastro-esophageal reflux disease without esophagitis: Secondary | ICD-10-CM | POA: Diagnosis not present

## 2021-04-05 DIAGNOSIS — F319 Bipolar disorder, unspecified: Secondary | ICD-10-CM | POA: Diagnosis not present

## 2021-04-05 DIAGNOSIS — E782 Mixed hyperlipidemia: Secondary | ICD-10-CM | POA: Diagnosis not present

## 2021-04-05 DIAGNOSIS — Z79899 Other long term (current) drug therapy: Secondary | ICD-10-CM | POA: Diagnosis not present

## 2021-04-05 DIAGNOSIS — I129 Hypertensive chronic kidney disease with stage 1 through stage 4 chronic kidney disease, or unspecified chronic kidney disease: Secondary | ICD-10-CM | POA: Diagnosis not present

## 2021-04-05 DIAGNOSIS — R600 Localized edema: Secondary | ICD-10-CM | POA: Diagnosis not present

## 2021-04-05 DIAGNOSIS — E039 Hypothyroidism, unspecified: Secondary | ICD-10-CM | POA: Diagnosis not present

## 2021-04-05 DIAGNOSIS — S51011A Laceration without foreign body of right elbow, initial encounter: Secondary | ICD-10-CM | POA: Diagnosis not present

## 2021-04-05 DIAGNOSIS — G301 Alzheimer's disease with late onset: Secondary | ICD-10-CM | POA: Diagnosis not present

## 2021-04-05 DIAGNOSIS — N1831 Chronic kidney disease, stage 3a: Secondary | ICD-10-CM | POA: Diagnosis not present

## 2021-04-08 DIAGNOSIS — Z1159 Encounter for screening for other viral diseases: Secondary | ICD-10-CM | POA: Diagnosis not present

## 2021-04-08 DIAGNOSIS — Z20828 Contact with and (suspected) exposure to other viral communicable diseases: Secondary | ICD-10-CM | POA: Diagnosis not present

## 2021-04-09 NOTE — Progress Notes (Signed)
NEUROLOGY FOLLOW UP OFFICE NOTE  [name] [id number]  Assessment/Plan:   1.  Cervicogenic headache 2.  Cervical spondylosis  1.  Gabapentin 200mg  at bedtime 2.  Follow up one year  Subjective:  [name] is a 4 year right-handed old female with HTN, HLD, hypothyroidism, Bipolar 1 disorder, schizoaffective disorder, Alzheimer's disease and history of breast cancer whofollows up for headache.  UPDATE: No headaches since last visit.   She reports facial edema and hair loss.  Dr. Felipa Eth found that she had low cortisol level.  Chronic left knee pain.  Cortisone shots no longer effective.  Saw pain management, Dr. Nelva Bush, and has noticed some mild relief which is an improvement.    Current NSAIDS:Ibuprofen 400mg  twice daily with food Current analgesics:Tylenol, hydrocodone-acetaminophen Current triptans:none Current ergotamine:none Current anti-emetic:Zofran 4mg  Current muscle relaxants:none Current anti-anxiolytic:alprazolam Current sleep aide:ramelteon 8mg  Current Antihypertensive medications:Metoprolol tartrate 25mg  daily, spironolactone Current Antidepressant/antipsychoticmedications: Olanzapine 15mg  twice daily, Seroquel 25mg  daily Current Anticonvulsant medications:Gabapentin 200 mg at bedtime; lamotrigine 150mg  daily Current anti-CGRP:none Current Vitamins/Herbal/Supplements:D,Fish Oil, MVI Current Antihistamines/Decongestants:none Other therapy:none Hormone/birth control:none Other medications:Synthroid, memantine.  She stopped donepezil and nausea resolved.    HISTORY: Onset:Around April 2020. No preceding event. Location:Back of head, usually occurs at night, not during the day. Non-radiating. Quality:Non-throbbing Initial intensity:Mild to moderate. She deniesthunderclap headache Aura:none Premonitory Phase:none Postdrome:none Associated symptoms:Shedenies associated scalp  paresthesias/allodynia, nausea, vomiting, photophobia, phonophobia, visual disturbance orunilateral numbness or weakness. Initial duration:Feels it as she goes to bed and falls asleep. If she wakes up throughout the night, she may feel it. Once she gets up in the morning, she feels okay. No headaches during the day. InitialFrequency:Almost every night Triggers:Laying down Relieving factors:Getting up. Activity:Does not aggravate She followed up with her orthopedist, Dr. Gladstone Lighter, who ordered a cervical X-ray and was told she had multilevel arthritis. She was prescribed ibuprofen.    Past imaging (personally reviewed): 04/27/2013 CT Head:Atrophy and mild chronic small vessel ischemic changes in white matter. No acute intracranial abnormality. 04/27/2013 CT Cervical Spine: Moderate to advanced cervical degenerative disease with spondylosis and facet hypertrophy as well as multilevel spinal stenosis most prominent at C5-6 and C6-7, as well as foraminal stenosis bilaterally most prominent at C3-4 and C4-5 and on right at C5-6. 07/05/2009 MRI Brain w/o: Chronic small vessel ischemic changes in hemispheric white matter and frontal and temporalatrophy. No acute intracranial abnormality. 10/11/2008 MRI Brain w/o:Frontal and temporal atrophy and chronic small vessel ischemic changes in hemispheric white matter. No acute intracranial abnormality.  Past NSAIDS:Ibuprofen800mg  twice daily (made her feel unsteady with slurred speech), naproxen 220mg  Past analgesics:none Past abortive triptans:none Past abortive ergotamine:none Past muscle relaxants:Tizanidine 4mg  at bedtime(ineffective) Past anti-emetic:none Past antihypertensive medications:none Past antidepressant medications:none Past anticonvulsant medications:none Past anti-CGRP:none Past vitamins/Herbal/Supplements:none Past antihistamines/decongestants:none Other past therapies:none  PAST  MEDICAL HISTORY: Past Medical History:  Diagnosis Date  . Anxiety   . Arrhythmia    right bundle branch block  . Bipolar 1 disorder ([place])   . Cancer ([place])   . Hyperlipidemia   . Hypertension   . Morbid obesity ([place])   . Osteoarthritis   . Schizo-affective schizophrenia ([place])   . Thyroid disease    hypothyroidism    MEDICATIONS: Current Outpatient Medications on File Prior to Visit  Medication Sig Dispense Refill  . acetaminophen (TYLENOL) 500 MG tablet 1 tablet as needed    . ALPRAZolam (XANAX) 0.5 MG tablet Take 1 mg by mouth 3 (three) times daily as needed for anxiety.     Marland Kitchen  amLODipine (NORVASC) 2.5 MG tablet Take 2.5 mg by mouth daily.    Marland Kitchen CALCIUM PO Take 2 tablets by mouth daily.     . cholecalciferol (VITAMIN D3) 25 MCG (1000 UT) tablet Take 1,000 Units by mouth daily. Wasn't sure if 1000 or 2000 units daily    . Cyanocobalamin (VITAMIN B-12 PO) Take 1 tablet by mouth daily.     Marland Kitchen gabapentin (NEURONTIN) 100 MG capsule TAKE 2 CAPSULES (200 MG TOTAL) BY MOUTH AT BEDTIME. 60 capsule 7  . HYDROcodone-acetaminophen (NORCO/VICODIN) 5-325 MG tablet 1 tablet as needed (Patient not taking: Reported on 01/01/2021)    . lamoTRIgine (LAMICTAL) 150 MG tablet Take 150 mg by mouth every evening.   12  . levothyroxine (SYNTHROID) 137 MCG tablet Take 137 mcg by mouth daily before breakfast.    . memantine (NAMENDA) 10 MG tablet Take 10 mg by mouth 2 (two) times daily.    . metoprolol tartrate (LOPRESSOR) 25 MG tablet Take 25 mg by mouth 2 (two) times daily.    . Multiple Vitamins-Minerals (MULTIVITAMIN ADULT PO) Take 1 tablet by mouth daily.    Marland Kitchen OLANZapine (ZYPREXA) 15 MG tablet Take 30 mg by mouth at bedtime.    . Omega-3 Fatty Acids (FISH OIL) 1000 MG CAPS Take 1,000 mg by mouth daily.    Marland Kitchen omeprazole (PRILOSEC) 40 MG capsule Take 40 mg by mouth daily.    . QUEtiapine (SEROQUEL) 25 MG tablet Take 50 mg by mouth at bedtime.     . ramelteon (ROZEREM) 8 MG tablet Take 8 mg by mouth at  bedtime as needed for sleep.    . rosuvastatin (CRESTOR) 20 MG tablet Take 1 tablet (20 mg total) by mouth daily. 90 tablet 3  . spironolactone (ALDACTONE) 25 MG tablet Take 1 tablet (25 mg total) by mouth daily. 90 tablet 3   No current facility-administered medications on file prior to visit.    ALLERGIES: Allergies  Allergen Reactions  . Lithium Nausea Only  . Fluoxetine Other (See Comments)    Pt felt crazy  . Macrodantin Other (See Comments)    unknown  . Mirabegron Other (See Comments)    ineffective  . Paroxetine Hcl     Other reaction(s): Other (See Comments) Made pt feel crazy  . Paxil [Paroxetine] Other (See Comments)    Made pt feel crazy   . Prednisone     Makes her feel very jittery  . Prozac [Fluoxetine Hcl] Other (See Comments)    Pt felt crazy   . Sertraline Hcl   . Solifenacin Other (See Comments)    Ineffective   . Sulfa Antibiotics Other (See Comments)    unknown  . Wellbutrin [Bupropion Hcl] Other (See Comments)    unknown    FAMILY HISTORY: Family History  Problem Relation Age of Onset  . Hypertension Mother   . Lung cancer Brother   . Schizophrenia Brother       Objective:  Blood pressure (!) 153/76, temperature (!) 86 F (30 C), resp. rate 20, height 5\' 3"  (1.6 m), weight 181 lb (82.1 kg), SpO2 100 %. General: No acute distress.  Patient appears well-groomed.   Head:  Normocephalic/atraumatic Eyes:  Fundi examined but not visualized Neck: supple, no paraspinal tenderness, full range of motion Heart:  Regular rate and rhythm Lungs:  Clear to auscultation bilaterally Back: No paraspinal tenderness Neurological Exam: alert and oriented to person, place, and time. Speech fluent and not dysarthric, language intact.  Slight saccadic eye movements  with tracking.  Otherwise, CN II-XII intact. Bulk and tone normal, muscle strength 5/5 throughout.  Mild postural and kinetic tremor in hands.  Sensation to light touch intact.  Deep tendon reflexes  absent throughout, toes downgoing.  Finger to nose testing without ataxia.  Unsteady stance and gait.  Ambulates with walker.  Metta Clines, DO  CC: [name], MD

## 2021-04-11 ENCOUNTER — Other Ambulatory Visit: Payer: Self-pay

## 2021-04-11 ENCOUNTER — Ambulatory Visit (INDEPENDENT_AMBULATORY_CARE_PROVIDER_SITE_OTHER): Payer: Medicare HMO | Admitting: Neurology

## 2021-04-11 ENCOUNTER — Encounter: Payer: Self-pay | Admitting: Neurology

## 2021-04-11 VITALS — BP 153/76 | Temp 86.0°F | Resp 20 | Ht 63.0 in | Wt 181.0 lb

## 2021-04-11 DIAGNOSIS — M47812 Spondylosis without myelopathy or radiculopathy, cervical region: Secondary | ICD-10-CM | POA: Diagnosis not present

## 2021-04-11 DIAGNOSIS — G4486 Cervicogenic headache: Secondary | ICD-10-CM | POA: Diagnosis not present

## 2021-04-11 NOTE — Patient Instructions (Signed)
Continue gabapentin 200mg  at bedtime

## 2021-04-14 NOTE — Progress Notes (Signed)
[facility]  Telephone:([phone]) [phone] Fax:(336) [phone]     ID: [name] DOB: [birth year]  MR#: [id number]  JYN#:[id number]  Patient Care Team: [name], MD as PCP - General (Internal Medicine) Pieter Partridge, DO as Consulting Physician (Neurology) Delsa Bern, MD as Consulting Physician (Obstetrics and Gynecology) Cathyann Kilfoyle, Virgie Dad, MD as Consulting Physician (Oncology) Skeet Latch, MD as Attending Physician (Cardiology) Wonda Horner, MD as Consulting Physician (Gastroenterology) Chucky May, MD as Consulting Physician (Psychiatry) Latanya Maudlin, MD as Consulting Physician (Orthopedic Surgery) Rolm Bookbinder, MD as Consulting Physician (Dermatology) Veatrice Bourbon, RT as Technician (Radiology) Chauncey Cruel, MD OTHER MD:  I connected with [name] on 04/15/21 at  1:15 PM EDT by telephone visit and verified that I am speaking with the correct person using two identifiers.   I discussed the limitations, risks, security and privacy concerns of performing an evaluation and management service by telemedicine and the availability of in-person appointments. I also discussed with the patient that there may be a patient responsible charge related to this service. The patient expressed understanding and agreed to proceed.   Other persons participating in the visit and their role in the encounter: None  Patient's location: Home Provider's location: Clinic  CHIEF COMPLAINT: noninvasive breast cancer, estrogen receptor positive  CURRENT TREATMENT: Observation   INTERVAL HISTORY: [name] was contacted today for follow up of her noninvasive breast cancer. She was evaluated in the breast cancer clinic on 01/01/2021. She opted for observation alone.  Her right  mammogram is due at [facility] 05/08/2021.  REVIEW OF SYSTEMS: [name] tells me she was having significant knee problems.  She had some injections under Dr. Ihor Gully and things got better and then  she fell and reinjured her knee so she is going to have the same procedure done again she says.  Her hair is thinning.  She tells me she always had abundant hair lovely golden color and she has never dyed it so she does not understand why her hair is thinning now.  She received some lab work from Dr. Felipa Eth in which she read me over the phone.  Aside from these issues a detailed review of systems today was stable  COVID 19 VACCINATION STATUS: Moderna x2 with booster in July.   RIGHT BREAST CANCER HISTORY: From the original intake note:  [name] had routine screening mammography at the [facility] on September 06, 2009. There was a possible abnormality in the upper outer quadrant of the right breast so she was brought back for diagnostic studies September 12, 2009.  Magnification views confirmed a 1.4 spiculated mass in the upper outer quadrant of the right breast with a few microcalcifications associated with it.  By ultrasound, this measured 1.5 cm and was highly suggestive of malignancy.  Biopsy was performed the same day and showed ([id number] and [phone]) an invasive lobular carcinoma which was 92% ER and 44% PR positive.  The proliferation marker was 13%.  The tumor did not overexpress Her-2 with a ratio of 1.31.    With this information, the patient was referred to Dr. Brantley Stage and after appropriate discussion, she underwent definitive right lumpectomy and axillary sentinel lymph node sampling October 17, 2009.  The final pathology there ([id number]) showed a 1.5 cm invasive lobular carcinoma, grade 1, with negative though closed margins (the in situ component was at 1 mm from the superior, inferior and medial margins) with no evidence of lymphovascular invasion and the single sentinel lymph  node clear.  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    HISTORY OF CURRENT ILLNESS: [name] has a history of right breast invasive lobular carcinoma, for which she underwent lumpectomy  and radiation in 10/2009.  She did not receive chemo and she did not tolerate antiestrogens.  I followed the patient, and she was subsequently released from follow up in 05/2014. See full history below.  More recently, she had routine screening mammography on 11/29/2020 showing a possible abnormality in the left breast. She underwent left diagnostic mammography with tomography at [facility] on 12/06/2020 showing: breast density category B; 1 cm grouped fine punctate calcifications in upper-outer left breast.  Accordingly on 12/20/2020 she proceeded to biopsy of the left breast area in question.  Post procedure mammography showed the clip at the targeted area.  The pathology from this procedure ([id number]) showed: ductal carcinoma in situ, low grade, with calcifications. Prognostic indicators significant for: estrogen receptor, >95% positive with strong staining intensity and progesterone receptor, 90% positive with moderate-strong staining intensity.  Cancer Staging Ductal carcinoma in situ (DCIS) of left breast Staging form: Breast, AJCC 8th Edition - Clinical: Stage 0 (cTis (DCIS), cN0, cM0, ER+, PR+) - Signed by Chauncey Cruel, MD on 01/01/2021 Nuclear grade: G1  Malignant neoplasm of overlapping sites of right breast in female, estrogen receptor positive ([place]) Staging form: Breast, AJCC 8th Edition - Clinical: No Stage Recommended (ycT1c, cN0, cM0, G1, ER+, PR+, HER2-) - Signed by Chauncey Cruel, MD on 01/01/2021 Stage prefix: Post-therapy   The patient's subsequent history is as detailed below.   PAST MEDICAL HISTORY: Past Medical History:  Diagnosis Date  . Anxiety   . Arrhythmia    right bundle branch block  . Bipolar 1 disorder ([place])   . Cancer ([place])   . Hyperlipidemia   . Hypertension   . Morbid obesity ([place])   . Osteoarthritis   . Schizo-affective schizophrenia ([place])   . Thyroid disease    hypothyroidism  History of: dilated cardiomyopathy, bundle branch block, heart  murmur, rosacea, psychiatric hospitalization in 1999 for bipolar disorder, chronic schizophrenia, and early Alzheimer's disease   PAST SURGICAL HISTORY: Past Surgical History:  Procedure Laterality Date  . BREAST SURGERY    . CHOLECYSTECTOMY    . DILATION AND CURETTAGE OF UTERUS    . ESOPHAGEAL MANOMETRY N/A 01/29/2018   Procedure: ESOPHAGEAL MANOMETRY (EM);  Surgeon: Wonda Horner, MD;  Location: [facility] END[facility]COPY;  Service: Endoscopy;  Laterality: N/A;  . HAND SURGERY     Right-trigger finger  . IR THORACENTESIS ASP PLEURAL SPACE W/IMG GUIDE  06/28/2018  . IR THORACENTESIS ASP PLEURAL SPACE W/IMG GUIDE  07/19/2018  . KNEE SURGERY     Left  . TONSILLECTOMY      FAMILY HISTORY: Family History  Problem Relation Age of Onset  . Hypertension Mother   . Lung cancer Brother   . Schizophrenia Brother    The patient's mother died from pneumonia at the age of 57 in the setting of Parkinson's disease.  The patient's father died at the age of 61.  The patient's only sibling was a brother who died from lung cancer in his fifties.    She is not aware of any breast ovarian or prostate cancer in the family   GYNECOLOGIC HISTORY:  No LMP recorded. Patient is postmenopausal. Menarche: 80 years old GX P 0 LMP ~age 25 Contraceptive HRT used for about 2 years  Hysterectomy? no BSO? 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  She is now retired.  She has lived in [place] about 30 years.  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  She is divorced. She lives in [place].  She attends [employer]    ADVANCED DIRECTIVES: The patient has named Verne Grain as her healthcare power of attorney   HEALTH MAINTENANCE: Social History   Tobacco Use  . Smoking status: Former Smoker    Quit  date: 12/08/1978    Years since quitting: 42.3  . Smokeless tobacco: Never Used  Substance Use Topics  . Alcohol use: Yes    Comment: 1 time a year  . Drug use: No      Allergies  Allergen Reactions  . Lithium Nausea Only  . Fluoxetine Other (See Comments)    Pt felt crazy  . Macrodantin Other (See Comments)    unknown  . Mirabegron Other (See Comments)    ineffective  . Other Other (See Comments)  . Paroxetine Hcl     Other reaction(s): Other (See Comments) Made pt feel crazy  . Paxil [Paroxetine] Other (See Comments)    Made pt feel crazy   . Prednisone     Makes her feel very jittery  . Prozac [Fluoxetine Hcl] Other (See Comments)    Pt felt crazy   . Sertraline Hcl   . Solifenacin Other (See Comments)    Ineffective   . Sulfa Antibiotics Other (See Comments)    unknown  . Wellbutrin [Bupropion Hcl] Other (See Comments)    unknown    Current Outpatient Medications  Medication Sig Dispense Refill  . acetaminophen (TYLENOL) 500 MG tablet 1 tablet as needed    . ALPRAZolam (XANAX) 0.5 MG tablet Take 1 mg by mouth 3 (three) times daily as needed for anxiety.     . ALPRAZolam (XANAX) 1 MG tablet     . amLODipine (NORVASC) 2.5 MG tablet Take 2.5 mg by mouth daily.    . Biotin 1 MG CAPS Take by mouth.    Marland Kitchen CALCIUM PO Take 2 tablets by mouth daily.     . cholecalciferol (VITAMIN D3) 25 MCG (1000 UT) tablet Take 1,000 Units by mouth daily. Wasn't sure if 1000 or 2000 units daily    . Cyanocobalamin (VITAMIN B-12 PO) Take 1 tablet by mouth daily.     Marland Kitchen gabapentin (NEURONTIN) 100 MG capsule TAKE 2 CAPSULES (200 MG TOTAL) BY MOUTH AT BEDTIME. 60 capsule 7  . HYDROcodone-acetaminophen (NORCO/VICODIN) 5-325 MG tablet     . hydrocortisone 2.5 % cream     . lamoTRIgine (LAMICTAL) 150 MG tablet Take 150 mg by mouth every evening.   12  . levothyroxine (SYNTHROID) 137 MCG tablet Take 137 mcg by mouth daily before breakfast.    . memantine (NAMENDA) 10 MG tablet Take 10 mg by  mouth 2 (two) times daily.    . metoprolol tartrate (LOPRESSOR) 25 MG tablet Take 25 mg by mouth 2 (two) times daily.    . Multiple Vitamins-Minerals (MULTIVITAMIN ADULT PO) Take 1 tablet by mouth daily.    Marland Kitchen OLANZapine (ZYPREXA) 15 MG tablet Take 30 mg by mouth at bedtime.    . Omega-3 Fatty Acids (FISH OIL) 1000 MG CAPS Take 1,000 mg by mouth daily.    Marland Kitchen omeprazole (PRILOSEC)  40 MG capsule Take 40 mg by mouth daily.    . QUEtiapine (SEROQUEL) 25 MG tablet Take 50 mg by mouth at bedtime.     . ramelteon (ROZEREM) 8 MG tablet Take 8 mg by mouth at bedtime as needed for sleep.    . rosuvastatin (CRESTOR) 20 MG tablet Take 1 tablet (20 mg total) by mouth daily. 90 tablet 3  . spironolactone (ALDACTONE) 25 MG tablet Take 1 tablet (25 mg total) by mouth daily. 90 tablet 3   No current facility-administered medications for this visit.    OBJECTIVE: White woman who appears younger than stated age  There were no vitals filed for this visit.   There is no height or weight on file to calculate BMI.   Wt Readings from Last 3 Encounters:  04/11/21 181 lb (82.1 kg)  01/01/21 172 lb 11.2 oz (78.3 kg)  12/13/20 171 lb 3.2 oz (77.7 kg)      ECOG FS:2 - Symptomatic, <50% confined to bed  Telemedicine visit 04/15/2021  LAB RESULTS:  CMP     Component Value Date/Time   NA 142 01/01/2021 1508   NA 142 05/11/2014 0911   K 4.4 01/01/2021 1508   K 4.1 05/11/2014 0911   CL 107 01/01/2021 1508   CL 105 05/12/2013 1452   CO2 24 01/01/2021 1508   CO2 19 (L) 05/11/2014 0911   GLUCOSE 118 (H) 01/01/2021 1508   GLUCOSE 106 05/11/2014 0911   GLUCOSE 100 (H) 05/12/2013 1452   BUN 19 01/01/2021 1508   BUN 14.9 05/11/2014 0911   CREATININE 0.79 01/01/2021 1508   CREATININE 0.89 10/10/2015 0933   CREATININE 0.9 05/11/2014 0911   CALCIUM 9.7 01/01/2021 1508   CALCIUM 9.9 05/11/2014 0911   PROT 7.2 01/01/2021 1508   PROT 6.8 05/11/2014 0911   ALBUMIN 3.7 01/01/2021 1508   ALBUMIN 3.9 05/11/2014 0911    AST 18 01/01/2021 1508   AST 37 (H) 05/11/2014 0911   ALT 33 01/01/2021 1508   ALT 40 05/11/2014 0911   ALKPHOS 37 (L) 01/01/2021 1508   ALKPHOS 52 05/11/2014 0911   BILITOT 0.3 01/01/2021 1508   BILITOT 0.33 05/11/2014 0911   GFRNONAA >60 01/01/2021 1508   GFRAA >60 08/20/2020 0333    No results found for: TOTALPROTELP, ALBUMINELP, A1GS, A2GS, BETS, BETA2SER, GAMS, MSPIKE, SPEI  Lab Results  Component Value Date   WBC 9.1 01/01/2021   NEUTROABS 5.5 01/01/2021   HGB 14.4 01/01/2021   HCT 41.5 01/01/2021   MCV 95.0 01/01/2021   PLT 258 01/01/2021    Lab Results  Component Value Date   LABCA2 12 04/01/2012    No components found for: JJOACZ660  No results for input(s): INR in the last 168 hours.  Lab Results  Component Value Date   LABCA2 12 04/01/2012    No results found for: YTK160  No results found for: FUX323  No results found for: FTD322  No results found for: CA2729  No components found for: HGQUANT  No results found for: CEA1 / No results found for: CEA1   No results found for: AFPTUMOR  No results found for: CHROMOGRNA  No results found for: KPAFRELGTCHN, LAMBDASER, KAPLAMBRATIO (kappa/lambda light chains)  No results found for: HGBA, HGBA2QUANT, HGBFQUANT, HGBSQUAN (Hemoglobinopathy evaluation)   Lab Results  Component Value Date   LDH 207 (H) 08/17/2020    No results found for: IRON, TIBC, IRONPCTSAT (Iron and TIBC)  Lab Results  Component Value Date   FERRITIN  97 08/17/2020    Urinalysis    Component Value Date/Time   COLORURINE YELLOW 08/18/2020 0018   APPEARANCEUR CLEAR 08/18/2020 0018   LABSPEC 1.020 08/18/2020 0018   PHURINE 6.0 08/18/2020 0018   GLUCOSEU NEGATIVE 08/18/2020 0018   HGBUR NEGATIVE 08/18/2020 0018   BILIRUBINUR NEGATIVE 08/18/2020 0018   KETONESUR NEGATIVE 08/18/2020 0018   PROTEINUR NEGATIVE 08/18/2020 0018   NITRITE NEGATIVE 08/18/2020 0018   LEUKOCYTESUR NEGATIVE 08/18/2020 0018    STUDIES: No  results found.   ELIGIBLE FOR AVAILABLE RESEARCH PROTOCOL: no  ASSESSMENT: 80 y.o. [place] woman with  RIGHT BREAST CANCER:  (1) status post right lumpectomy and sentinel lymph node sampling in November 2010 for a T1cN0, stage IA invasive lobular carcinoma, grade 1, strongly estrogen and progesterone receptor positive, HER-2/neu negative, with low MIB-1.    (2) Status post ADJUVANT radiation therapy, completed in February 2011.  (3) Did not tolerate aromatase anastrozole or letrozole and WAs not felt to be a candidate for tamoxifen--opted for observation alone and was discharged from follow-up 2015  LEFT BREAST CANCER (4) status post left breast biopsy 12/20/2020 for a clinically 1 cm ductal carcinoma in situ, grade 1, strongly estrogen and progesterone receptor positive  (5) refused standard of care lumpectomy, opted for observation alone  (6) she will have bi-annual mammograms in May and November indefinitely   PLAN: Yaris is doing fine as far as her breast cancer is concerned and certainly has no symptoms related to her tumor.  We reviewed the fact that she does need to have mammography twice a year.  That should have been done before today's visit but had been postponed so it is now scheduled for 05/08/2021.  She has that appointment on her appointment book she tells me.  Her next mammogram will be in November.  She will see me in person in December.  She knows to call for any other issue that may develop before then    Sarajane Jews C. Gomer France, MD 04/15/2021 1:10 PM Medical Oncology and Hematology [facility] [place], [place] [address] Tel. [phone]    Fax. [phone]   This document serves as a record of services personally performed by Lurline Del, MD. It was created on his behalf by Wilburn Mylar, a trained medical scribe. The creation of this record is based on the scribe's personal observations and the provider's statements to  them.   I, Lurline Del MD, have reviewed the above documentation for accuracy and completeness, and I agree with the above.   *Total Encounter Time as defined by the Centers for Medicare and Medicaid Services includes, in addition to the face-to-face time of a patient visit (documented in the note above) non-face-to-face time: obtaining and reviewing outside history, ordering and reviewing medications, tests or procedures, care coordination (communications with other health care professionals or caregivers) and documentation in the medical record.

## 2021-04-15 ENCOUNTER — Inpatient Hospital Stay: Payer: Medicare HMO | Attending: Oncology | Admitting: Oncology

## 2021-04-15 DIAGNOSIS — C50811 Malignant neoplasm of overlapping sites of right female breast: Secondary | ICD-10-CM | POA: Diagnosis not present

## 2021-04-15 DIAGNOSIS — Z79899 Other long term (current) drug therapy: Secondary | ICD-10-CM | POA: Insufficient documentation

## 2021-04-15 DIAGNOSIS — Z87891 Personal history of nicotine dependence: Secondary | ICD-10-CM | POA: Insufficient documentation

## 2021-04-15 DIAGNOSIS — Z801 Family history of malignant neoplasm of trachea, bronchus and lung: Secondary | ICD-10-CM | POA: Insufficient documentation

## 2021-04-15 DIAGNOSIS — Z8249 Family history of ischemic heart disease and other diseases of the circulatory system: Secondary | ICD-10-CM | POA: Insufficient documentation

## 2021-04-15 DIAGNOSIS — Z170 Estrogen receptor positive status [ER+]: Secondary | ICD-10-CM

## 2021-04-15 DIAGNOSIS — Z818 Family history of other mental and behavioral disorders: Secondary | ICD-10-CM | POA: Insufficient documentation

## 2021-04-15 DIAGNOSIS — Z1159 Encounter for screening for other viral diseases: Secondary | ICD-10-CM | POA: Diagnosis not present

## 2021-04-15 DIAGNOSIS — Z20828 Contact with and (suspected) exposure to other viral communicable diseases: Secondary | ICD-10-CM | POA: Diagnosis not present

## 2021-04-18 DIAGNOSIS — E2740 Unspecified adrenocortical insufficiency: Secondary | ICD-10-CM | POA: Diagnosis not present

## 2021-04-19 ENCOUNTER — Telehealth: Payer: Self-pay | Admitting: Oncology

## 2021-04-19 NOTE — Telephone Encounter (Signed)
sch per 5/10 los, pt aware 

## 2021-04-22 DIAGNOSIS — Z1159 Encounter for screening for other viral diseases: Secondary | ICD-10-CM | POA: Diagnosis not present

## 2021-04-22 DIAGNOSIS — Z20828 Contact with and (suspected) exposure to other viral communicable diseases: Secondary | ICD-10-CM | POA: Diagnosis not present

## 2021-04-25 ENCOUNTER — Other Ambulatory Visit: Payer: Self-pay | Admitting: Neurology

## 2021-04-29 DIAGNOSIS — Z1159 Encounter for screening for other viral diseases: Secondary | ICD-10-CM | POA: Diagnosis not present

## 2021-04-29 DIAGNOSIS — Z20828 Contact with and (suspected) exposure to other viral communicable diseases: Secondary | ICD-10-CM | POA: Diagnosis not present

## 2021-04-30 DIAGNOSIS — M792 Neuralgia and neuritis, unspecified: Secondary | ICD-10-CM | POA: Diagnosis not present

## 2021-04-30 DIAGNOSIS — M25562 Pain in left knee: Secondary | ICD-10-CM | POA: Diagnosis not present

## 2021-05-06 DIAGNOSIS — Z20828 Contact with and (suspected) exposure to other viral communicable diseases: Secondary | ICD-10-CM | POA: Diagnosis not present

## 2021-05-06 DIAGNOSIS — Z1159 Encounter for screening for other viral diseases: Secondary | ICD-10-CM | POA: Diagnosis not present

## 2021-05-13 DIAGNOSIS — Z20828 Contact with and (suspected) exposure to other viral communicable diseases: Secondary | ICD-10-CM | POA: Diagnosis not present

## 2021-05-13 DIAGNOSIS — Z1159 Encounter for screening for other viral diseases: Secondary | ICD-10-CM | POA: Diagnosis not present

## 2021-05-16 DIAGNOSIS — M1712 Unilateral primary osteoarthritis, left knee: Secondary | ICD-10-CM | POA: Diagnosis not present

## 2021-05-16 DIAGNOSIS — M25562 Pain in left knee: Secondary | ICD-10-CM | POA: Diagnosis not present

## 2021-05-20 DIAGNOSIS — Z20828 Contact with and (suspected) exposure to other viral communicable diseases: Secondary | ICD-10-CM | POA: Diagnosis not present

## 2021-05-20 DIAGNOSIS — Z1159 Encounter for screening for other viral diseases: Secondary | ICD-10-CM | POA: Diagnosis not present

## 2021-05-27 DIAGNOSIS — Z1159 Encounter for screening for other viral diseases: Secondary | ICD-10-CM | POA: Diagnosis not present

## 2021-05-27 DIAGNOSIS — Z20828 Contact with and (suspected) exposure to other viral communicable diseases: Secondary | ICD-10-CM | POA: Diagnosis not present

## 2021-05-29 DIAGNOSIS — M1712 Unilateral primary osteoarthritis, left knee: Secondary | ICD-10-CM | POA: Diagnosis not present

## 2021-05-29 DIAGNOSIS — M792 Neuralgia and neuritis, unspecified: Secondary | ICD-10-CM | POA: Diagnosis not present

## 2021-05-31 DIAGNOSIS — L570 Actinic keratosis: Secondary | ICD-10-CM | POA: Diagnosis not present

## 2021-05-31 DIAGNOSIS — C44529 Squamous cell carcinoma of skin of other part of trunk: Secondary | ICD-10-CM | POA: Diagnosis not present

## 2021-05-31 DIAGNOSIS — Z85828 Personal history of other malignant neoplasm of skin: Secondary | ICD-10-CM | POA: Diagnosis not present

## 2021-05-31 DIAGNOSIS — L821 Other seborrheic keratosis: Secondary | ICD-10-CM | POA: Diagnosis not present

## 2021-05-31 DIAGNOSIS — D692 Other nonthrombocytopenic purpura: Secondary | ICD-10-CM | POA: Diagnosis not present

## 2021-06-03 DIAGNOSIS — Z20828 Contact with and (suspected) exposure to other viral communicable diseases: Secondary | ICD-10-CM | POA: Diagnosis not present

## 2021-06-03 DIAGNOSIS — Z1159 Encounter for screening for other viral diseases: Secondary | ICD-10-CM | POA: Diagnosis not present

## 2021-06-10 DIAGNOSIS — Z1159 Encounter for screening for other viral diseases: Secondary | ICD-10-CM | POA: Diagnosis not present

## 2021-06-10 DIAGNOSIS — Z20828 Contact with and (suspected) exposure to other viral communicable diseases: Secondary | ICD-10-CM | POA: Diagnosis not present

## 2021-06-12 DIAGNOSIS — Z853 Personal history of malignant neoplasm of breast: Secondary | ICD-10-CM | POA: Diagnosis not present

## 2021-06-12 DIAGNOSIS — R921 Mammographic calcification found on diagnostic imaging of breast: Secondary | ICD-10-CM | POA: Diagnosis not present

## 2021-06-17 DIAGNOSIS — Z20828 Contact with and (suspected) exposure to other viral communicable diseases: Secondary | ICD-10-CM | POA: Diagnosis not present

## 2021-06-17 DIAGNOSIS — Z1159 Encounter for screening for other viral diseases: Secondary | ICD-10-CM | POA: Diagnosis not present

## 2021-06-24 DIAGNOSIS — Z20828 Contact with and (suspected) exposure to other viral communicable diseases: Secondary | ICD-10-CM | POA: Diagnosis not present

## 2021-06-24 DIAGNOSIS — Z1159 Encounter for screening for other viral diseases: Secondary | ICD-10-CM | POA: Diagnosis not present

## 2021-06-25 ENCOUNTER — Other Ambulatory Visit: Payer: Self-pay | Admitting: Neurology

## 2021-06-28 ENCOUNTER — Other Ambulatory Visit: Payer: Self-pay

## 2021-06-28 MED ORDER — SPIRONOLACTONE 25 MG PO TABS: 25.0000 mg | ORAL_TABLET | Freq: Every day | ORAL | 3 refills | Status: DC

## 2021-07-01 DIAGNOSIS — Z20828 Contact with and (suspected) exposure to other viral communicable diseases: Secondary | ICD-10-CM | POA: Diagnosis not present

## 2021-07-01 DIAGNOSIS — Z1159 Encounter for screening for other viral diseases: Secondary | ICD-10-CM | POA: Diagnosis not present

## 2021-07-08 DIAGNOSIS — Z1159 Encounter for screening for other viral diseases: Secondary | ICD-10-CM | POA: Diagnosis not present

## 2021-07-08 DIAGNOSIS — Z20828 Contact with and (suspected) exposure to other viral communicable diseases: Secondary | ICD-10-CM | POA: Diagnosis not present

## 2021-07-09 DIAGNOSIS — M6281 Muscle weakness (generalized): Secondary | ICD-10-CM | POA: Diagnosis not present

## 2021-07-09 DIAGNOSIS — R2681 Unsteadiness on feet: Secondary | ICD-10-CM | POA: Diagnosis not present

## 2021-07-09 DIAGNOSIS — R41841 Cognitive communication deficit: Secondary | ICD-10-CM | POA: Diagnosis not present

## 2021-07-10 ENCOUNTER — Other Ambulatory Visit: Payer: Self-pay

## 2021-07-10 ENCOUNTER — Encounter: Payer: Self-pay | Admitting: Internal Medicine

## 2021-07-10 ENCOUNTER — Ambulatory Visit (INDEPENDENT_AMBULATORY_CARE_PROVIDER_SITE_OTHER): Payer: Medicare HMO | Admitting: Internal Medicine

## 2021-07-10 VITALS — BP 128/84 | HR 74 | Ht 63.0 in | Wt 175.0 lb

## 2021-07-10 DIAGNOSIS — E2749 Other adrenocortical insufficiency: Secondary | ICD-10-CM

## 2021-07-10 LAB — COMPREHENSIVE METABOLIC PANEL
ALT: 26 U/L (ref 0–35)
AST: 24 U/L (ref 0–37)
Albumin: 4.2 g/dL (ref 3.5–5.2)
Alkaline Phosphatase: 52 U/L (ref 39–117)
BUN: 10 mg/dL (ref 6–23)
CO2: 28 mEq/L (ref 19–32)
Calcium: 10.3 mg/dL (ref 8.4–10.5)
Chloride: 101 mEq/L (ref 96–112)
Creatinine, Ser: 0.72 mg/dL (ref 0.40–1.20)
GFR: 78.51 mL/min (ref >60.00)
Glucose, Bld: 109 mg/dL — ABNORMAL HIGH (ref 70–99)
Potassium: 4.2 mEq/L (ref 3.5–5.1)
Sodium: 140 mEq/L (ref 135–145)
Total Bilirubin: 0.4 mg/dL (ref 0.2–1.2)
Total Protein: 7.3 g/dL (ref 6.0–8.3)

## 2021-07-10 LAB — VITAMIN D 25 HYDROXY (VIT D DEFICIENCY, FRACTURES): VITD: 47.63 ng/mL (ref 30.00–100.00)

## 2021-07-10 MED ORDER — HYDROCORTISONE 5 MG PO TABS: 5.0000 mg | ORAL_TABLET | ORAL | 1 refills | Status: DC

## 2021-07-10 NOTE — Patient Instructions (Signed)
-  START Hydrocortisone 5 mg, TWO tablets every morning and ONE tablet every afternoon between  2-4 pm    ADRENAL INSUFFICIENCY SICK DAY RULES:  Should you face an extreme emotional or physical stress such as trauma, surgery or acute illness, this will require extra steroid coverage so that the body can meet that stress.   Without increasing the steroid dose you may experience severe weakness, headache, dizziness, nausea and vomiting and possibly a more serious deterioration in health.  Typically the dose of steroids will only need to be increased for a couple of days if you have an illness that is transient and managed in the community.   If you are unable to take/absorb an increased dose of steroids orally because of vomiting or diarrhea, you will urgently require steroid injections and should present to an Emergency Department.  The general advice for any serious illness is as follows: Double the normal daily steroid dose for up to 3 days if you have a temperature of more than 37.50C (99.88F) with signs of sickness, or severe emotional or physical distress Contact your primary care doctor and Endocrinologist if the illness worsens or it lasts for more than 3 days.  In cases of severe illness, urgent medical assistance should be promptly sought. If you experience vomiting/diarrhea or are unable to take steroids by mouth, please administer the Hydrocortisone injection kit and seek urgent medical help.

## 2021-07-10 NOTE — Progress Notes (Signed)
Name: [name]  MRN/ DOB: [id number], 04-Dec-1941    Age/ Sex: 80 y.o., female    PCP: [name], MD   Reason for Endocrinology Evaluation: Adrenal insufficiency      Date of Initial Endocrinology Evaluation: 07/10/2021     HPI: [name] is a 80 y.o. female with a past medical history of CAD, CHF, IBS, hypothyroidism and Hx of breast ca ( S/P lumpectomy and radiation 2010)  . The patient presented for initial endocrinology clinic visit on 07/10/2021 for consultative assistance with her adrenal insufficiency.   Pt has been referred her for multiple endocrine abnormalities to include abnormal cortisol at 1.8 ug/dL in 03/2021 followed by abnormal cosyntropin stimulation test with a 60 minute cortisol of 11.8 ug/dL    She has also been noted with low TSH with a nadir of 0.01 uIU/mL in 11/2020. Pt on levothyroxine    Pt has hyperglycemia with borderline serum calcium in 2021 but consistent elevation in 2022 with a a max level of 10.5 mg/dL and low normal PTH at 17.     She is having hair loss which has been improving as well as puffy cheeks   She is currently on Biotin   She has not been on any oral prednisone but has been on long term left knee intra-articular injections , last injection was  She declines knee surgery and pending wheel chair  delivery   She is on Hydrocodone for pain    Weight fluctuates  Has rare morning nausea, rare diarrhea  She has fatigue but mainly struggles with pain    She is on calcium and MVI  Denies renal stones  No osteoporosis  Has hx of right knee fracture S/P fall    HISTORY:  Past Medical History:  Past Medical History:  Diagnosis Date   Anxiety    Arrhythmia    right bundle branch block   Bipolar 1 disorder ([place])    Cancer ([place])    Hyperlipidemia    Hypertension    Morbid obesity ([place])    Osteoarthritis    Schizo-affective schizophrenia ([place])    Thyroid disease    hypothyroidism   Past Surgical History:  Past  Surgical History:  Procedure Laterality Date   BREAST SURGERY     CHOLECYSTECTOMY     DILATION AND CURETTAGE OF UTERUS     ESOPHAGEAL MANOMETRY N/A 01/29/2018   Procedure: ESOPHAGEAL MANOMETRY (EM);  Surgeon: Wonda Horner, MD;  Location: [facility];  Service: Endoscopy;  Laterality: N/A;   HAND SURGERY     Right-trigger finger   IR THORACENTESIS ASP PLEURAL SPACE W/IMG GUIDE  06/28/2018   IR THORACENTESIS ASP PLEURAL SPACE W/IMG GUIDE  07/19/2018   KNEE SURGERY     Left   TONSILLECTOMY      Social History:  reports that she quit smoking about 42 years ago. Her smoking use included cigarettes. She has never used smokeless tobacco. She reports current alcohol use. She reports that she does not use drugs. Family History: family history includes Hypertension in her mother; Lung cancer in her brother; Schizophrenia in her brother.   HOME MEDICATIONS: Allergies as of 07/10/2021       Reactions   Lithium Nausea Only   Fluoxetine Other (See Comments)   Pt felt crazy   Macrodantin Other (See Comments)   unknown   Mirabegron Other (See Comments)   ineffective   Other Other (See Comments)   Paroxetine Hcl  Other reaction(s): Other (See Comments) Made pt feel crazy   Paxil [paroxetine] Other (See Comments)   Made pt feel crazy   Prednisone    Makes her feel very jittery   Prozac [fluoxetine Hcl] Other (See Comments)   Pt felt crazy   Sertraline Hcl    Solifenacin Other (See Comments)   Ineffective    Sulfa Antibiotics Other (See Comments)   unknown   Wellbutrin [bupropion Hcl] Other (See Comments)   unknown        Medication List        Accurate as of July 10, 2021  9:19 AM. If you have any questions, ask your nurse or doctor.          acetaminophen 500 MG tablet Commonly known as: TYLENOL 1 tablet as needed   ALPRAZolam 1 MG tablet Commonly known as: XANAX   ALPRAZolam 0.5 MG tablet Commonly known as: XANAX Take 1 mg by mouth 3 (three) times daily as  needed for anxiety.   amLODipine 2.5 MG tablet Commonly known as: NORVASC Take 2.5 mg by mouth daily.   Biotin 1 MG Caps Take by mouth.   CALCIUM PO Take 2 tablets by mouth daily.   cholecalciferol 25 MCG (1000 UNIT) tablet Commonly known as: VITAMIN D3 Take 1,000 Units by mouth daily. Wasn't sure if 1000 or 2000 units daily   Fish Oil 1000 MG Caps Take 1,000 mg by mouth daily.   gabapentin 100 MG capsule Commonly known as: NEURONTIN TAKE 2 CAPSULES BY MOUTH AT BEDTIME.   HYDROcodone-acetaminophen 5-325 MG tablet Commonly known as: NORCO/VICODIN   hydrocortisone 2.5 % cream   lamoTRIgine 150 MG tablet Commonly known as: LAMICTAL Take 150 mg by mouth every evening.   levothyroxine 137 MCG tablet Commonly known as: SYNTHROID Take 137 mcg by mouth daily before breakfast.   memantine 10 MG tablet Commonly known as: NAMENDA Take 10 mg by mouth 2 (two) times daily.   metoprolol tartrate 25 MG tablet Commonly known as: LOPRESSOR Take 25 mg by mouth 2 (two) times daily.   MULTIVITAMIN ADULT PO Take 1 tablet by mouth daily.   OLANZapine 15 MG tablet Commonly known as: ZYPREXA Take 30 mg by mouth at bedtime.   omeprazole 40 MG capsule Commonly known as: PRILOSEC Take 40 mg by mouth daily.   QUEtiapine 25 MG tablet Commonly known as: SEROQUEL Take 50 mg by mouth at bedtime.   ramelteon 8 MG tablet Commonly known as: ROZEREM Take 8 mg by mouth at bedtime as needed for sleep.   rosuvastatin 20 MG tablet Commonly known as: CRESTOR Take 1 tablet (20 mg total) by mouth daily.   spironolactone 25 MG tablet Commonly known as: ALDACTONE Take 1 tablet (25 mg total) by mouth daily.   VITAMIN B-12 PO Take 1 tablet by mouth daily.          REVIEW OF SYSTEMS: A comprehensive ROS was conducted with the patient and is negative except as per HPI   OBJECTIVE:  VS: BP 128/84 (BP Location: Left Arm, Patient Position: Sitting, Cuff Size: Normal)   Pulse 74   Ht  '5\' 3"'$  (1.6 m)   Wt 175 lb (79.4 kg)   SpO2 95%   BMI 31.00 kg/m     Body surface area is 1.88 meters squared.   Wt Readings from Last 3 Encounters:  07/10/21 175 lb (79.4 kg)  04/11/21 181 lb (82.1 kg)  01/01/21 172 lb 11.2 oz (78.3 kg)  EXAM: General: Pt appears well and is in NAD  Neck: General: Supple without adenopathy. Thyroid: Thyroid size normal.  No goiter or nodules appreciated.   Lungs: Clear with good BS bilat with no rales, rhonchi, or wheezes  Heart: Auscultation: RRR.  Abdomen: Normoactive bowel sounds, soft, nontender, without masses or organomegaly palpable  Extremities:  BL LE: No pretibial edema normal ROM and strength.  Skin: Hair: Texture and amount normal with gender appropriate distribution Skin Inspection: No rashes, Skin Palpation: Skin temperature, texture, and thickness normal to palpation  Mental Status: Judgment, insight: Intact Mood and affect: No depression, anxiety, or agitation     DATA REVIEWED:   Results for MYCHALA, DEVOID (MRN [id number]) as of 07/11/2021 12:45  Ref. Range 07/10/2021 09:41  Sodium Latest Ref Range: 135 - 145 mEq/L 140  Potassium Latest Ref Range: 3.5 - 5.1 mEq/L 4.2  Chloride Latest Ref Range: 96 - 112 mEq/L 101  CO2 Latest Ref Range: 19 - 32 mEq/L 28  Glucose Latest Ref Range: 70 - 99 mg/dL 109 (H)  BUN Latest Ref Range: 6 - 23 mg/dL 10  Creatinine Latest Ref Range: 0.40 - 1.20 mg/dL 0.72  Calcium Latest Ref Range: 8.4 - 10.5 mg/dL 10.3  Alkaline Phosphatase Latest Ref Range: 39 - 117 U/L 52  Albumin Latest Ref Range: 3.5 - 5.2 g/dL 4.2  AST Latest Ref Range: 0 - 37 U/L 24  ALT Latest Ref Range: 0 - 35 U/L 26  Total Protein Latest Ref Range: 6.0 - 8.3 g/dL 7.3  Total Bilirubin Latest Ref Range: 0.2 - 1.2 mg/dL 0.4  GFR Latest Ref Range: >60.00 mL/min 78.51  VITD Latest Ref Range: 30.00 - 100.00 ng/mL 47.63  PTH, Intact Latest Ref Range: 16 - 77 pg/mL 12 (L)    04/19/2021 Baseline cortisol 1.3 ug/dL   Stimulated 11.8 ug/dL    04/05/2021 Gluc 111 BUN/Cr 11/0.69 GFR 87 Ca 10.5 High  Corrected 10.4 ( 8.6-10.3) high  TSH 0.07 uIU/mL  PTH 17 Cortisol 1.7   ASSESSMENT/PLAN/RECOMMENDATIONS:   Adrenal Insufficiency:  -Patient had an abnormal cosyntropin stimulation test in 04/2021 -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 -I am going to start her on physiologic dose of hydrocortisone, I have discussed with her that we may be able to take her off hydrocortisone with appropriate testing if she opts not to take intra-articular steroid injections anymore.  But she is not sure at this time.  -For safety reasons we will start hydrocortisone at this time, we discussed sick day rules -ACTH pending    Medication Hydrocortisone 5 mg, 2 tablets with breakfast and 1 tablet in the afternoon  2. Hypothyroidism  -Historically she has had low TSH, I am unable to check her TSH today because she is on biotin -Patient advised to hold biotin 2 to 3 days prior to thyroid checks in the future, we discussed biotin will interfere with TFT results  Continue levothyroxine 137 MCG daily  3. Hypercalcemia :   She has been noted to have hypercalcemia on 04/19/2021 with a corrected serum calcium of 10.4 (reference 8.6-10.3)  -Her PTH was low at PCPs office and again today but her calcium is normal at this time. -I am also going to check  PTH related peptide, 1, 25 dihydroxy vitamin D -Vitamin D3 is normal  Follow-up in 3 months  Signed electronically by: Mack Guise, MD  [facility]  [address] [place], [place] [address] Phone: [phone] FAX:  [phone]   CC: [name], [address] [address] Phone: [phone] Fax: [phone]   Return to Endocrinology clinic as below: Future Appointments  Date Time Provider [place]  07/25/2021  9:20 AM Skeet Latch, MD [facility]  11/19/2021  9:30 AM Magrinat, Virgie Dad, MD [facility] None  04/17/2022  8:50 AM Pieter Partridge, DO [facility] None

## 2021-07-11 ENCOUNTER — Encounter: Payer: Self-pay | Admitting: Internal Medicine

## 2021-07-12 DIAGNOSIS — R41841 Cognitive communication deficit: Secondary | ICD-10-CM | POA: Diagnosis not present

## 2021-07-12 DIAGNOSIS — R2681 Unsteadiness on feet: Secondary | ICD-10-CM | POA: Diagnosis not present

## 2021-07-12 DIAGNOSIS — M6281 Muscle weakness (generalized): Secondary | ICD-10-CM | POA: Diagnosis not present

## 2021-07-15 DIAGNOSIS — Z20828 Contact with and (suspected) exposure to other viral communicable diseases: Secondary | ICD-10-CM | POA: Diagnosis not present

## 2021-07-16 DIAGNOSIS — R41841 Cognitive communication deficit: Secondary | ICD-10-CM | POA: Diagnosis not present

## 2021-07-16 DIAGNOSIS — R2681 Unsteadiness on feet: Secondary | ICD-10-CM | POA: Diagnosis not present

## 2021-07-16 DIAGNOSIS — M6281 Muscle weakness (generalized): Secondary | ICD-10-CM | POA: Diagnosis not present

## 2021-07-17 LAB — VITAMIN D 1,25 DIHYDROXY
Vitamin D 1, 25 (OH)2 Total: 53 pg/mL (ref 18–72)
Vitamin D2 1, 25 (OH)2: <8 pg/mL
Vitamin D3 1, 25 (OH)2: 53 pg/mL

## 2021-07-17 LAB — ACTH: C206 ACTH: 7 pg/mL (ref 6–50)

## 2021-07-17 LAB — PARATHYROID HORMONE, INTACT (NO CA): PTH: 12 pg/mL — ABNORMAL LOW (ref 16–77)

## 2021-07-17 LAB — PTH-RELATED PEPTIDE: PTH-Related Protein (PTH-RP): 10 pg/mL — ABNORMAL LOW (ref 11–20)

## 2021-07-17 LAB — CALCIUM, IONIZED: Calcium, Ion: 5.45 mg/dL (ref 4.8–5.6)

## 2021-07-19 DIAGNOSIS — M545 Low back pain, unspecified: Secondary | ICD-10-CM | POA: Diagnosis not present

## 2021-07-22 DIAGNOSIS — Z8616 Personal history of COVID-19: Secondary | ICD-10-CM | POA: Diagnosis not present

## 2021-07-24 NOTE — Progress Notes (Signed)
Cardiology Office Note   Date:  07/25/2021   ID:  [name], DOB 08-09-1941, MRN [id number]  PCP:  Amy Manes, MD  Cardiologist:   Amy Latch, MD   No chief complaint on file.   History of Present Illness: [name] is a 80 y.o. female with  hypertension, hyperlipidemia, chronic shortness of breath, chronic diastolic heart failure, morbid obesity,  prior breast cancer, Alzheimer's disease, schizo-affective disorder and bipolar disorder who presents for follow up.  She was previously a patient of Dr. Mare Roberts.  She previously had a nuclear stress 05/2014 that was negative for ischemia.  She had an echo 04/2008 that revealed normal systolic function, mild aortic sclerosis, trace mitral regurgitation and trace tricuspid regurgitation.  Ms. [name] lives at [facility]. She has several friends there and is very happy.  She was noted to have a murmur and was referred for an echo that revealed LVEF 65-70% with grade 1 diastolic dysfunction.  She was noted to have a narrow LVOT.   Ms. [name] was admitted to the hospital 08/2020 with a fall and fever.  She reported generalized weakness and no preceding cardiac symptoms.  Chest CT noted coronary calcifications.  Cardiac enzymes were elevated and cardiology consulted.  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  At the time she was febrile and hypoxic.  Symptoms were thought to be due to demand ischemia and outpatient evaluation was recommended.  She had a nuclear stress test 09/2020 that revealed LVEF 60% and no perfusion deficits.  She followed up with Amy Drown, NP, on 09/2020 and was doing well.  At her last appointment she was started on rosuvastatin for incidentally noted coronary calcification. There was concern for apical hypertrophy but she was not an ICD candidate and was asymptomatic, so no further testing was performed. In the interim she was diagnosed with ER+  breast cancer and has opted for observation only.  Today, she feels pretty good overall. However, she continues to have issues with her left knee. Her last 4 cortisone injections have not helped her pain management at all. She has been in extreme pain for months now due to her knee, and has ordered a wheelchair. Her regimen includes Hydrocodone 4 times a day as needed, but she takes it 2 or 3 times. Her endocrinologist has prescribed 5 mg hydrocortisone, and she was told she had low cortisol. Also, she reports being short of breath very easily. It is very painful for her to bend over to pick something up, when she rights herself she needs to stop for a minute to rest and catch her breath. She used to exercise by walking 3 miles a day, prior to using a walker for years. She would like to increase her exercise, but asks if it would be appropriate to push through the pain that limits her. At [facility] she does have access to a large room with exercise classes, as well as an equipment room. Of note, a few weeks ago a large, erythematous lesion has developed on her right face. She has also noticed her hair was thinning, but this seems to be improving lately. Also, she reports having some memory loss due to early-onset Alzheimer's dx 5 years ago. She denies any palpitations, or chest pain. No lightheadedness, headaches, syncope, orthopnea, or PND. Also has no lower extremity edema.   Past Medical History:  Diagnosis Date   Adrenal insufficiency ([place]) 07/25/2021   Anxiety  Arrhythmia    right bundle branch block   Bipolar 1 disorder ([place])    Cancer ([place])    Hyperlipidemia    Hypertension    Morbid obesity ([place])    Osteoarthritis    Schizo-affective schizophrenia ([place])    Thyroid disease    hypothyroidism    Past Surgical History:  Procedure Laterality Date   BREAST SURGERY     CHOLECYSTECTOMY     DILATION AND CURETTAGE OF UTERUS     ESOPHAGEAL MANOMETRY N/A 01/29/2018   Procedure: ESOPHAGEAL  MANOMETRY (EM);  Surgeon: Wonda Horner, MD;  Location[facility];  Service: Endoscopy;  Laterality: N/A;   HAND SURGERY     Right-trigger finger   IR THORACENTESIS ASP PLEURAL SPACE W/IMG GUIDE  06/28/2018   IR THORACENTESIS ASP PLEURAL SPACE W/IMG GUIDE  07/19/2018   KNEE SURGERY     Left   TONSILLECTOMY       Current Outpatient Medications  Medication Sig Dispense Refill   acetaminophen (TYLENOL) 500 MG tablet 1 tablet as needed     ALPRAZolam (XANAX) 0.5 MG tablet Take 1 mg by mouth 3 (three) times daily as needed for anxiety.      ALPRAZolam (XANAX) 1 MG tablet Take 1 mg by mouth at bedtime.     amLODipine (NORVASC) 2.5 MG tablet Take 2.5 mg by mouth daily.     Biotin 1 MG CAPS Take by mouth.     CALCIUM PO Take 2 tablets by mouth daily.      cholecalciferol (VITAMIN D3) 25 MCG (1000 UT) tablet Take 1,000 Units by mouth daily. Wasn't sure if 1000 or 2000 units daily     Cyanocobalamin (VITAMIN B-12 PO) Take 1 tablet by mouth daily.      gabapentin (NEURONTIN) 100 MG capsule TAKE 2 CAPSULES BY MOUTH AT BEDTIME. 60 capsule 6   HYDROcodone-acetaminophen (NORCO/VICODIN) 5-325 MG tablet      hydrocortisone (CORTEF) 5 MG tablet Take 1 tablet (5 mg total) by mouth as directed. Take 2 tablets with Breakfast  and 1 tablet every afternoon between 2-4 pm 100 tablet 1   lamoTRIgine (LAMICTAL) 150 MG tablet Take 150 mg by mouth every evening.   12   levothyroxine (SYNTHROID) 137 MCG tablet Take 137 mcg by mouth daily before breakfast.     memantine (NAMENDA) 10 MG tablet Take 10 mg by mouth 2 (two) times daily.     metoprolol tartrate (LOPRESSOR) 25 MG tablet Take 25 mg by mouth 2 (two) times daily.     Multiple Vitamins-Minerals (MULTIVITAMIN ADULT PO) Take 1 tablet by mouth daily.     OLANZapine (ZYPREXA) 15 MG tablet Take 30 mg by mouth at bedtime.     Omega-3 Fatty Acids (FISH OIL) 1000 MG CAPS Take 1,000 mg by mouth daily.     omeprazole (PRILOSEC) 40 MG capsule Take 40 mg by mouth  daily.     QUEtiapine (SEROQUEL) 25 MG tablet Take 50 mg by mouth at bedtime.      ramelteon (ROZEREM) 8 MG tablet Take 8 mg by mouth at bedtime as needed for sleep.     rosuvastatin (CRESTOR) 20 MG tablet Take 1 tablet (20 mg total) by mouth daily. 90 tablet 3   spironolactone (ALDACTONE) 25 MG tablet Take 1 tablet (25 mg total) by mouth daily. 90 tablet 3   No current facility-administered medications for this visit.    Allergies:   Lithium, Fluoxetine, Macrodantin, Mirabegron, Other, Paroxetine hcl, Paxil [paroxetine], Prednisone, Prozac [fluoxetine  hcl], Sertraline hcl, Solifenacin, Sulfa antibiotics, and Wellbutrin [bupropion hcl]    Social History:  The patient  reports that she quit smoking about 42 years ago. Her smoking use included cigarettes. She has never used smokeless tobacco. She reports current alcohol use. She reports that she does not use drugs.   Family History:  The patient's family history includes Hypertension in her mother; Lung cancer in her brother; Schizophrenia in her brother.    ROS:   Please see the history of present illness. (+) Left knee pain (+) Shortness of breath (+) Erythematous lesion on right face All other systems are reviewed and negative.    PHYSICAL EXAM: VS:  BP 118/62   Pulse 69   Ht '5\' 3"'$  (1.6 m)   Wt 173 lb 9.6 oz (78.7 kg)   SpO2 93%   BMI 30.75 kg/m  , BMI Body mass index is 30.75 kg/m. GENERAL:  Cushingoid face.  Flushed cheeks. HEENT: Pupils equal round and reactive, fundi not visualized, oral mucosa unremarkable NECK:  No jugular venous distention, waveform within normal limits, carotid upstroke brisk and symmetric, no bruits, no thyromegaly LUNGS:  Clear to auscultation bilaterally HEART:  RRR.  PMI not displaced or sustained,S1 and S2 within normal limits, no S3, no S4, no clicks, no rubs, II/VI systolic murmur at the LUSB ABD:  Flat, positive bowel sounds normal in frequency in pitch, no bruits, no rebound, no guarding, no  midline pulsatile mass, no hepatomegaly, no splenomegaly EXT:  2 plus pulses throughout, no edema, no cyanosis no clubbing SKIN:  No rashes no nodules.  Multiple ecchymoses. Erythematous lesion on right face.  Facial hirsutism NEURO:  Cranial nerves II through XII grossly intact, motor grossly intact throughout PSYCH:  Cognitively intact, oriented to person place and time  EKG:   07/25/2021: EKG is not ordered today. 12/13/2020: Sinus rhythm.  Rate 75 bpm.  Right bundle branch block.  LVH with secondary repolarization abnormality. 08/20/17: Sinus rhythm.  Rate 65 bpm.  RBBB.   01/09/2017: sinus rhythm.  Rate 71 bpm.  RBBB.  Lateral TWI   Lexiscan Myoview 09/2020: Nuclear stress EF: 60%. There was no ST segment deviation noted during stress. The study is normal. This is a low risk study. The left ventricular ejection fraction is normal (55-65%).   Normal stress nuclear study with no ischemia or infarction; EF 60 with normal wall motion.   Echo 08/18/20:  1. Left ventricular ejection fraction, by estimation, is 70 to 75%. The  left ventricle has hyperdynamic function. The left ventricle has no  regional wall motion abnormalities.   2. Right ventricular systolic function is normal. The right ventricular  size is mildly enlarged.   3. Limited study with use of Definity echocontrast, LVEF is hyperdynamic.  There is significant eccentric LVH predominantly in the apical segments. A  cardiac MRI is recommended to evaluate for apical form of hypertrophic  cardiomyopathy.    Echo 02/12/17: Study Conclusions   - Left ventricle: Narrow LVOT No obstruction at rest. The cavity   size was normal. Wall thickness was increased in a pattern of   mild LVH. Systolic function was vigorous. The estimated ejection   fraction was in the range of 65% to 70%. Doppler parameters are   consistent with abnormal left ventricular relaxation (grade 1   diastolic dysfunction). - Aortic valve: There was trivial  regurgitation. - Mitral valve: Calcified annulus. Mildly thickened leaflets . - Left atrium: The atrium was mildly dilated.  Recent Labs: 08/17/2020:  B Natriuretic Peptide 156.5; Magnesium 1.9; TSH <0.010 01/01/2021: Hemoglobin 14.4; Platelet Count 258 07/10/2021: ALT 26; BUN 10; Creatinine, Ser 0.72; Potassium 4.2; Sodium 140   08/07/16: Sodium 141, potassium 4.0, BUN 22, creatinine 0.92  Lipid Panel    Component Value Date/Time   CHOL 187 10/10/2015 0933   TRIG 138 08/17/2020 1135   HDL 36 (L) 10/10/2015 0933   CHOLHDL 5.2 (H) 10/10/2015 0933   VLDL 63 (H) 10/10/2015 0933   LDLCALC 88 10/10/2015 0933   LDLDIRECT 118.0 11/09/2014 0853     04/03/17: Sodium 142, potassium 4.7, BUN 21, creatinine 1.01 AST 28, ALT 35 Total cholesterol 177, triglycerides 164, HDL 41, LDL 103  Wt Readings from Last 3 Encounters:  07/25/21 173 lb 9.6 oz (78.7 kg)  07/10/21 175 lb (79.4 kg)  04/11/21 181 lb (82.1 kg)      ASSESSMENT AND PLAN: Benign hypertensive heart disease without heart failure Blood pressure is well-controlled on amlodipine, spironolactone and metoprolol.   Hypercholesterolemia Lipids were recently checked with Dr. Felipa Eth.  We will get a copy.  Continue rosuvastatin.  Adrenal insufficiency (HCC) Thought to be due to frequent steroid injections.  She was started on hydrocortisone and notes an improvement in her hair growth.  She is followed by endocrinology.  HLD (hyperlipidemia) Continue rosuvastatin.  Lipids were recently checked by Dr. Felipa Eth.  Chronic diastolic CHF (congestive heart failure) ([place]) She is euvolemic and blood pressure is well-controlled.  She is not requiring any diuretics other than spironolactone.  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  She is not a candidate for ICD, is asymptomatic and is on metoprolol.  No changes recommended at this time.     Current medicines are reviewed at length with the patient today.  The patient  does not have concerns regarding medicines.  The following changes have been made:  no change  Labs/ tests ordered today include:   No orders of the defined types were placed in this encounter.    Disposition:   FU with Tiffany C. Oval Linsey, MD, [facility] in 6 months  I,Mathew Stumpf,acting as a Education administrator for Amy Latch, MD.,have documented all relevant documentation on the behalf of Amy Latch, MD,as directed by  Amy Latch, MD while in the presence of Amy Latch, MD.  I, [place] Oval Linsey, MD have reviewed all documentation for this visit.  The documentation of the exam, diagnosis, procedures, and orders on 07/25/2021 are all accurate and complete.   Signed, Tiffany C. Oval Linsey, MD, [facility]  07/25/2021 10:54 AM    [place]

## 2021-07-25 ENCOUNTER — Encounter (HOSPITAL_BASED_OUTPATIENT_CLINIC_OR_DEPARTMENT_OTHER): Payer: Self-pay | Admitting: Cardiovascular Disease

## 2021-07-25 ENCOUNTER — Other Ambulatory Visit: Payer: Self-pay

## 2021-07-25 ENCOUNTER — Ambulatory Visit (INDEPENDENT_AMBULATORY_CARE_PROVIDER_SITE_OTHER): Payer: Medicare HMO | Admitting: Cardiovascular Disease

## 2021-07-25 DIAGNOSIS — E7800 Pure hypercholesterolemia, unspecified: Secondary | ICD-10-CM | POA: Diagnosis not present

## 2021-07-25 DIAGNOSIS — E782 Mixed hyperlipidemia: Secondary | ICD-10-CM | POA: Diagnosis not present

## 2021-07-25 DIAGNOSIS — I119 Hypertensive heart disease without heart failure: Secondary | ICD-10-CM

## 2021-07-25 DIAGNOSIS — E2740 Unspecified adrenocortical insufficiency: Secondary | ICD-10-CM

## 2021-07-25 DIAGNOSIS — I5032 Chronic diastolic (congestive) heart failure: Secondary | ICD-10-CM

## 2021-07-25 HISTORY — DX: Unspecified adrenocortical insufficiency: E27.40

## 2021-07-25 NOTE — Assessment & Plan Note (Signed)
Thought to be due to frequent steroid injections.  She was started on hydrocortisone and notes an improvement in her hair growth.  She is followed by endocrinology.

## 2021-07-25 NOTE — Assessment & Plan Note (Signed)
Blood pressure is well-controlled on amlodipine, spironolactone and metoprolol.

## 2021-07-25 NOTE — Patient Instructions (Signed)
Medication Instructions:  Your physician recommends that you continue on your current medications as directed. Please refer to the Current Medication list given to you today.  *If you need a refill on your cardiac medications before your next appointment, please call your pharmacy*  Lab Work: NONE   Testing/Procedures: NONE   Follow-Up: At [facility], you and your health needs are our priority.  As part of our continuing mission to provide you with exceptional heart care, we have created designated Provider Care Teams.  These Care Teams include your primary Cardiologist (physician) and Advanced Practice Providers (APPs -  Physician Assistants and Nurse Practitioners) who all work together to provide you with the care you need, when you need it.  We recommend signing up for the patient portal called "MyChart".  Sign up information is provided on this After Visit Summary.  MyChart is used to connect with patients for Virtual Visits (Telemedicine).  Patients are able to view lab/test results, encounter notes, upcoming appointments, etc.  Non-urgent messages can be sent to your provider as well.   To learn more about what you can do with MyChart, go to [email or web address].    Your next appointment:   12 month(s)  The format for your next appointment:   In Person  Provider:   Skeet Latch, MD  Your physician recommends that you schedule a follow-up appointment in [place]

## 2021-07-25 NOTE — Assessment & Plan Note (Addendum)
She is euvolemic and blood pressure is well-controlled.  She is not requiring any diuretics other than spironolactone.  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  She is not a candidate for ICD, is asymptomatic and is on metoprolol.  No changes recommended at this time.

## 2021-07-25 NOTE — Assessment & Plan Note (Signed)
Lipids were recently checked with Dr. Felipa Eth.  We will get a copy.  Continue rosuvastatin.

## 2021-07-25 NOTE — Progress Notes (Signed)
Cardiology Office Note   Date:  07/25/2021   ID:  [name], DOB May 24, 1941, MRN [id number]  PCP:  [name], MD  Cardiologist:   Skeet Latch, MD   No chief complaint on file.   History of Present Illness: [name] is a 80 y.o. female with  hypertension, hyperlipidemia, chronic shortness of breath, chronic diastolic heart failure, morbid obesity,  prior breast cancer, Alzheimer's disease, schizo-affective disorder and bipolar disorder who presents for follow up.  She was previously a patient of Dr. Mare Ferrari.  She previously had a nuclear stress 05/2014 that was negative for ischemia.  She had an echo 04/2008 that revealed normal systolic function, mild aortic sclerosis, trace mitral regurgitation and trace tricuspid regurgitation.  [name] lives at [facility]. She has several friends there and is very happy.  She was noted to have a murmur and was referred for an echo that revealed LVEF 65-70% with grade 1 diastolic dysfunction.  She was noted to have a narrow LVOT.   [name] was admitted to the hospital 08/2020 with a fall and fever.  She reported generalized weakness and no preceding cardiac symptoms.  Chest CT noted coronary calcifications.  Cardiac enzymes were elevated and cardiology consulted.  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  At the time she was febrile and hypoxic.  Symptoms were thought to be due to demand ischemia and outpatient evaluation was recommended.  She had a nuclear stress test 09/2020 that revealed LVEF 60% and no perfusion deficits.  She followed up with [name], NP, on 09/2020 and was doing well.  At her last appointment she was started on rosuvastatin for incidentally noted coronary calcification. There was concern for apical hypertrophy but she was not an ICD candidate and was asymptomatic, so no further testing was performed. In the interim she was diagnosed with ER+  breast cancer and has opted for observation only.  Today, she feels pretty good overall. However, she continues to have issues with her left knee. Her last 4 cortisone injections have not helped her pain management at all. She has been in extreme pain for months now due to her knee, and has ordered a wheelchair. Her regimen includes Hydrocodone 4 times a day as needed, but she takes it 2 or 3 times. Her endocrinologist has prescribed 5 mg hydrocortisone, and she was told she had low cortisol. Also, she reports being short of breath very easily. It is very painful for her to bend over to pick something up, when she rights herself she needs to stop for a minute to rest and catch her breath. She used to exercise by walking 3 miles a day, prior to using a walker for years. She would like to increase her exercise, but asks if it would be appropriate to push through the pain that limits her. At [facility] she does have access to a large room with exercise classes, as well as an equipment room. Of note, a few weeks ago a large, erythematous lesion has developed on her right face. She has also noticed her hair was thinning, but this seems to be improving lately. Also, she reports having some memory loss due to early-onset Alzheimer's dx 5 years ago. She denies any palpitations, or chest pain. No lightheadedness, headaches, syncope, orthopnea, or PND. Also has no lower extremity edema.   Past Medical History:  Diagnosis Date   Adrenal insufficiency ([place]) 07/25/2021   Anxiety  Arrhythmia    right bundle branch block   Bipolar 1 disorder ([place])    Cancer ([place])    Hyperlipidemia    Hypertension    Morbid obesity ([place])    Osteoarthritis    Schizo-affective schizophrenia ([place])    Thyroid disease    hypothyroidism    Past Surgical History:  Procedure Laterality Date   BREAST SURGERY     CHOLECYSTECTOMY     DILATION AND CURETTAGE OF UTERUS     ESOPHAGEAL MANOMETRY N/A 01/29/2018   Procedure: ESOPHAGEAL  MANOMETRY (EM);  Surgeon: Wonda Horner, MD;  Location: [facility];  Service: Endoscopy;  Laterality: N/A;   HAND SURGERY     Right-trigger finger   IR THORACENTESIS ASP PLEURAL SPACE W/IMG GUIDE  06/28/2018   IR THORACENTESIS ASP PLEURAL SPACE W/IMG GUIDE  07/19/2018   KNEE SURGERY     Left   TONSILLECTOMY       Current Outpatient Medications  Medication Sig Dispense Refill   acetaminophen (TYLENOL) 500 MG tablet 1 tablet as needed     ALPRAZolam (XANAX) 0.5 MG tablet Take 1 mg by mouth 3 (three) times daily as needed for anxiety.      ALPRAZolam (XANAX) 1 MG tablet Take 1 mg by mouth at bedtime.     amLODipine (NORVASC) 2.5 MG tablet Take 2.5 mg by mouth daily.     Biotin 1 MG CAPS Take by mouth.     CALCIUM PO Take 2 tablets by mouth daily.      cholecalciferol (VITAMIN D3) 25 MCG (1000 UT) tablet Take 1,000 Units by mouth daily. Wasn't sure if 1000 or 2000 units daily     Cyanocobalamin (VITAMIN B-12 PO) Take 1 tablet by mouth daily.      gabapentin (NEURONTIN) 100 MG capsule TAKE 2 CAPSULES BY MOUTH AT BEDTIME. 60 capsule 6   HYDROcodone-acetaminophen (NORCO/VICODIN) 5-325 MG tablet      hydrocortisone (CORTEF) 5 MG tablet Take 1 tablet (5 mg total) by mouth as directed. Take 2 tablets with Breakfast  and 1 tablet every afternoon between 2-4 pm 100 tablet 1   lamoTRIgine (LAMICTAL) 150 MG tablet Take 150 mg by mouth every evening.   12   levothyroxine (SYNTHROID) 137 MCG tablet Take 137 mcg by mouth daily before breakfast.     memantine (NAMENDA) 10 MG tablet Take 10 mg by mouth 2 (two) times daily.     metoprolol tartrate (LOPRESSOR) 25 MG tablet Take 25 mg by mouth 2 (two) times daily.     Multiple Vitamins-Minerals (MULTIVITAMIN ADULT PO) Take 1 tablet by mouth daily.     OLANZapine (ZYPREXA) 15 MG tablet Take 30 mg by mouth at bedtime.     Omega-3 Fatty Acids (FISH OIL) 1000 MG CAPS Take 1,000 mg by mouth daily.     omeprazole (PRILOSEC) 40 MG capsule Take 40 mg by mouth  daily.     QUEtiapine (SEROQUEL) 25 MG tablet Take 50 mg by mouth at bedtime.      ramelteon (ROZEREM) 8 MG tablet Take 8 mg by mouth at bedtime as needed for sleep.     rosuvastatin (CRESTOR) 20 MG tablet Take 1 tablet (20 mg total) by mouth daily. 90 tablet 3   spironolactone (ALDACTONE) 25 MG tablet Take 1 tablet (25 mg total) by mouth daily. 90 tablet 3   No current facility-administered medications for this visit.    Allergies:   Lithium, Fluoxetine, Macrodantin, Mirabegron, Other, Paroxetine hcl, Paxil [paroxetine], Prednisone, Prozac [fluoxetine  hcl], Sertraline hcl, Solifenacin, Sulfa antibiotics, and Wellbutrin [bupropion hcl]    Social History:  The patient  reports that she quit smoking about 42 years ago. Her smoking use included cigarettes. She has never used smokeless tobacco. She reports current alcohol use. She reports that she does not use drugs.   Family History:  The patient's family history includes Hypertension in her mother; Lung cancer in her brother; Schizophrenia in her brother.    ROS:   Please see the history of present illness. (+) Left knee pain (+) Shortness of breath (+) Erythematous lesion on right face All other systems are reviewed and negative.    PHYSICAL EXAM: VS:  BP 118/62   Pulse 69   Ht '5\' 3"'$  (1.6 m)   Wt 173 lb 9.6 oz (78.7 kg)   SpO2 93%   BMI 30.75 kg/m  , BMI Body mass index is 30.75 kg/m. GENERAL:  Cushingoid face.  Flushed cheeks. HEENT: Pupils equal round and reactive, fundi not visualized, oral mucosa unremarkable NECK:  No jugular venous distention, waveform within normal limits, carotid upstroke brisk and symmetric, no bruits, no thyromegaly LUNGS:  Clear to auscultation bilaterally HEART:  RRR.  PMI not displaced or sustained,S1 and S2 within normal limits, no S3, no S4, no clicks, no rubs, II/VI systolic murmur at the LUSB ABD:  Flat, positive bowel sounds normal in frequency in pitch, no bruits, no rebound, no guarding, no  midline pulsatile mass, no hepatomegaly, no splenomegaly EXT:  2 plus pulses throughout, no edema, no cyanosis no clubbing SKIN:  No rashes no nodules.  Multiple ecchymoses. Erythematous lesion on right face.  Facial hirsutism NEURO:  Cranial nerves II through XII grossly intact, motor grossly intact throughout PSYCH:  Cognitively intact, oriented to person place and time  EKG:   07/25/2021: EKG is not ordered today. 12/13/2020: Sinus rhythm.  Rate 75 bpm.  Right bundle branch block.  LVH with secondary repolarization abnormality. 08/20/17: Sinus rhythm.  Rate 65 bpm.  RBBB.   01/09/2017: sinus rhythm.  Rate 71 bpm.  RBBB.  Lateral TWI   Lexiscan Myoview 09/2020: Nuclear stress EF: 60%. There was no ST segment deviation noted during stress. The study is normal. This is a low risk study. The left ventricular ejection fraction is normal (55-65%).   Normal stress nuclear study with no ischemia or infarction; EF 60 with normal wall motion.   Echo 08/18/20:  1. Left ventricular ejection fraction, by estimation, is 70 to 75%. The  left ventricle has hyperdynamic function. The left ventricle has no  regional wall motion abnormalities.   2. Right ventricular systolic function is normal. The right ventricular  size is mildly enlarged.   3. Limited study with use of Definity echocontrast, LVEF is hyperdynamic.  There is significant eccentric LVH predominantly in the apical segments. A  cardiac MRI is recommended to evaluate for apical form of hypertrophic  cardiomyopathy.    Echo 02/12/17: Study Conclusions   - Left ventricle: Narrow LVOT No obstruction at rest. The cavity   size was normal. Wall thickness was increased in a pattern of   mild LVH. Systolic function was vigorous. The estimated ejection   fraction was in the range of 65% to 70%. Doppler parameters are   consistent with abnormal left ventricular relaxation (grade 1   diastolic dysfunction). - Aortic valve: There was trivial  regurgitation. - Mitral valve: Calcified annulus. Mildly thickened leaflets . - Left atrium: The atrium was mildly dilated.  Recent Labs: 08/17/2020:  B Natriuretic Peptide 156.5; Magnesium 1.9; TSH <0.010 01/01/2021: Hemoglobin 14.4; Platelet Count 258 07/10/2021: ALT 26; BUN 10; Creatinine, Ser 0.72; Potassium 4.2; Sodium 140   08/07/16: Sodium 141, potassium 4.0, BUN 22, creatinine 0.92  Lipid Panel    Component Value Date/Time   CHOL 187 10/10/2015 0933   TRIG 138 08/17/2020 1135   HDL 36 (L) 10/10/2015 0933   CHOLHDL 5.2 (H) 10/10/2015 0933   VLDL 63 (H) 10/10/2015 0933   LDLCALC 88 10/10/2015 0933   LDLDIRECT 118.0 11/09/2014 0853     04/03/17: Sodium 142, potassium 4.7, BUN 21, creatinine 1.01 AST 28, ALT 35 Total cholesterol 177, triglycerides 164, HDL 41, LDL 103  Wt Readings from Last 3 Encounters:  07/25/21 173 lb 9.6 oz (78.7 kg)  07/10/21 175 lb (79.4 kg)  04/11/21 181 lb (82.1 kg)      ASSESSMENT AND PLAN: Benign hypertensive heart disease without heart failure Blood pressure is well-controlled on amlodipine, spironolactone and metoprolol.   Hypercholesterolemia Lipids were recently checked with Dr. Felipa Eth.  We will get a copy.  Continue rosuvastatin.  Adrenal insufficiency (H[facility]) Thought to be due to frequent steroid injections.  She was started on hydrocortisone and notes an improvement in her hair growth.  She is followed by endocrinology.  HLD (hyperlipidemia) Continue rosuvastatin.  Lipids were recently checked by Dr. Felipa Eth.  Chronic diastolic CHF (congestive heart failure) ([place]) She is euvolemic and blood pressure is well-controlled.  She is not requiring any diuretics other than spironolactone.  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  She is not a candidate for ICD, is asymptomatic and is on metoprolol.  No changes recommended at this time.     Current medicines are reviewed at length with the patient today.  The patient  does not have concerns regarding medicines.  The following changes have been made:  no change  Labs/ tests ordered today include:   No orders of the defined types were placed in this encounter.    Disposition:   FU with Corina Stacy C. Oval Linsey, MD, [facility] in 6 months  I,Mathew Stumpf,acting as a Education administrator for Skeet Latch, MD.,have documented all relevant documentation on the behalf of Skeet Latch, MD,as directed by  Skeet Latch, MD while in the presence of Skeet Latch, MD.  I, Jackson Oval Linsey, MD have reviewed all documentation for this visit.  The documentation of the exam, diagnosis, procedures, and orders on 07/25/2021 are all accurate and complete.   Signed, Abdulkadir Emmanuel C. Oval Linsey, MD, [facility]  07/25/2021 10:55 AM    [place]

## 2021-07-25 NOTE — Assessment & Plan Note (Signed)
Continue rosuvastatin.  Lipids were recently checked by Dr. Felipa Eth.

## 2021-07-26 DIAGNOSIS — R2681 Unsteadiness on feet: Secondary | ICD-10-CM | POA: Diagnosis not present

## 2021-07-26 DIAGNOSIS — R41841 Cognitive communication deficit: Secondary | ICD-10-CM | POA: Diagnosis not present

## 2021-07-26 DIAGNOSIS — M6281 Muscle weakness (generalized): Secondary | ICD-10-CM | POA: Diagnosis not present

## 2021-07-29 DIAGNOSIS — Z8616 Personal history of COVID-19: Secondary | ICD-10-CM | POA: Diagnosis not present

## 2021-07-30 DIAGNOSIS — M6281 Muscle weakness (generalized): Secondary | ICD-10-CM | POA: Diagnosis not present

## 2021-07-30 DIAGNOSIS — R41841 Cognitive communication deficit: Secondary | ICD-10-CM | POA: Diagnosis not present

## 2021-07-30 DIAGNOSIS — R2681 Unsteadiness on feet: Secondary | ICD-10-CM | POA: Diagnosis not present

## 2021-08-02 DIAGNOSIS — M6281 Muscle weakness (generalized): Secondary | ICD-10-CM | POA: Diagnosis not present

## 2021-08-02 DIAGNOSIS — R41841 Cognitive communication deficit: Secondary | ICD-10-CM | POA: Diagnosis not present

## 2021-08-02 DIAGNOSIS — R2681 Unsteadiness on feet: Secondary | ICD-10-CM | POA: Diagnosis not present

## 2021-08-05 DIAGNOSIS — Z8616 Personal history of COVID-19: Secondary | ICD-10-CM | POA: Diagnosis not present

## 2021-08-06 DIAGNOSIS — M6281 Muscle weakness (generalized): Secondary | ICD-10-CM | POA: Diagnosis not present

## 2021-08-06 DIAGNOSIS — R41841 Cognitive communication deficit: Secondary | ICD-10-CM | POA: Diagnosis not present

## 2021-08-06 DIAGNOSIS — R2681 Unsteadiness on feet: Secondary | ICD-10-CM | POA: Diagnosis not present

## 2021-08-08 DIAGNOSIS — G8929 Other chronic pain: Secondary | ICD-10-CM | POA: Diagnosis not present

## 2021-08-08 DIAGNOSIS — M25562 Pain in left knee: Secondary | ICD-10-CM | POA: Diagnosis not present

## 2021-08-08 DIAGNOSIS — R41841 Cognitive communication deficit: Secondary | ICD-10-CM | POA: Diagnosis not present

## 2021-08-08 DIAGNOSIS — R2681 Unsteadiness on feet: Secondary | ICD-10-CM | POA: Diagnosis not present

## 2021-08-08 DIAGNOSIS — M6281 Muscle weakness (generalized): Secondary | ICD-10-CM | POA: Diagnosis not present

## 2021-08-12 ENCOUNTER — Other Ambulatory Visit: Payer: Self-pay | Admitting: Internal Medicine

## 2021-08-12 DIAGNOSIS — Z8616 Personal history of COVID-19: Secondary | ICD-10-CM | POA: Diagnosis not present

## 2021-08-13 ENCOUNTER — Telehealth: Payer: Self-pay

## 2021-08-13 NOTE — Telephone Encounter (Signed)
Attempted to contact patient to schedule a Palliative Care consult appointment. No answer and no voicemail.

## 2021-08-16 DIAGNOSIS — R41841 Cognitive communication deficit: Secondary | ICD-10-CM | POA: Diagnosis not present

## 2021-08-16 DIAGNOSIS — R2681 Unsteadiness on feet: Secondary | ICD-10-CM | POA: Diagnosis not present

## 2021-08-16 DIAGNOSIS — M6281 Muscle weakness (generalized): Secondary | ICD-10-CM | POA: Diagnosis not present

## 2021-08-19 DIAGNOSIS — Z20828 Contact with and (suspected) exposure to other viral communicable diseases: Secondary | ICD-10-CM | POA: Diagnosis not present

## 2021-08-20 DIAGNOSIS — R2681 Unsteadiness on feet: Secondary | ICD-10-CM | POA: Diagnosis not present

## 2021-08-20 DIAGNOSIS — M6281 Muscle weakness (generalized): Secondary | ICD-10-CM | POA: Diagnosis not present

## 2021-08-20 DIAGNOSIS — R41841 Cognitive communication deficit: Secondary | ICD-10-CM | POA: Diagnosis not present

## 2021-08-21 ENCOUNTER — Telehealth: Payer: Self-pay

## 2021-08-21 NOTE — Telephone Encounter (Signed)
Several attempts to contact patient to schedule a Palliative Care consult appointment. No answer left a message to return call by 08/22/21. If no return call will cancel referral on 9/15.

## 2021-08-26 DIAGNOSIS — L858 Other specified epidermal thickening: Secondary | ICD-10-CM | POA: Diagnosis not present

## 2021-08-26 DIAGNOSIS — D692 Other nonthrombocytopenic purpura: Secondary | ICD-10-CM | POA: Diagnosis not present

## 2021-08-26 DIAGNOSIS — Z20828 Contact with and (suspected) exposure to other viral communicable diseases: Secondary | ICD-10-CM | POA: Diagnosis not present

## 2021-08-26 DIAGNOSIS — L821 Other seborrheic keratosis: Secondary | ICD-10-CM | POA: Diagnosis not present

## 2021-08-26 DIAGNOSIS — L218 Other seborrheic dermatitis: Secondary | ICD-10-CM | POA: Diagnosis not present

## 2021-08-26 DIAGNOSIS — D0439 Carcinoma in situ of skin of other parts of face: Secondary | ICD-10-CM | POA: Diagnosis not present

## 2021-08-26 DIAGNOSIS — Z85828 Personal history of other malignant neoplasm of skin: Secondary | ICD-10-CM | POA: Diagnosis not present

## 2021-08-29 DIAGNOSIS — M6281 Muscle weakness (generalized): Secondary | ICD-10-CM | POA: Diagnosis not present

## 2021-08-29 DIAGNOSIS — R2681 Unsteadiness on feet: Secondary | ICD-10-CM | POA: Diagnosis not present

## 2021-08-29 DIAGNOSIS — R41841 Cognitive communication deficit: Secondary | ICD-10-CM | POA: Diagnosis not present

## 2021-09-02 DIAGNOSIS — Z20828 Contact with and (suspected) exposure to other viral communicable diseases: Secondary | ICD-10-CM | POA: Diagnosis not present

## 2021-09-09 DIAGNOSIS — Z8616 Personal history of COVID-19: Secondary | ICD-10-CM | POA: Diagnosis not present

## 2021-09-16 DIAGNOSIS — M792 Neuralgia and neuritis, unspecified: Secondary | ICD-10-CM | POA: Diagnosis not present

## 2021-09-16 DIAGNOSIS — Z8616 Personal history of COVID-19: Secondary | ICD-10-CM | POA: Diagnosis not present

## 2021-09-16 DIAGNOSIS — M1712 Unilateral primary osteoarthritis, left knee: Secondary | ICD-10-CM | POA: Diagnosis not present

## 2021-09-20 DIAGNOSIS — R41841 Cognitive communication deficit: Secondary | ICD-10-CM | POA: Diagnosis not present

## 2021-09-20 DIAGNOSIS — R2681 Unsteadiness on feet: Secondary | ICD-10-CM | POA: Diagnosis not present

## 2021-09-20 DIAGNOSIS — M6281 Muscle weakness (generalized): Secondary | ICD-10-CM | POA: Diagnosis not present

## 2021-09-23 DIAGNOSIS — Z8616 Personal history of COVID-19: Secondary | ICD-10-CM | POA: Diagnosis not present

## 2021-09-27 DIAGNOSIS — R41841 Cognitive communication deficit: Secondary | ICD-10-CM | POA: Diagnosis not present

## 2021-09-27 DIAGNOSIS — M6281 Muscle weakness (generalized): Secondary | ICD-10-CM | POA: Diagnosis not present

## 2021-09-27 DIAGNOSIS — R2681 Unsteadiness on feet: Secondary | ICD-10-CM | POA: Diagnosis not present

## 2021-09-30 DIAGNOSIS — Z8616 Personal history of COVID-19: Secondary | ICD-10-CM | POA: Diagnosis not present

## 2021-10-02 DIAGNOSIS — M179 Osteoarthritis of knee, unspecified: Secondary | ICD-10-CM | POA: Diagnosis not present

## 2021-10-02 DIAGNOSIS — F0390 Unspecified dementia without behavioral disturbance: Secondary | ICD-10-CM | POA: Diagnosis not present

## 2021-10-02 DIAGNOSIS — Z1231 Encounter for screening mammogram for malignant neoplasm of breast: Secondary | ICD-10-CM | POA: Diagnosis not present

## 2021-10-02 DIAGNOSIS — D0512 Intraductal carcinoma in situ of left breast: Secondary | ICD-10-CM | POA: Diagnosis not present

## 2021-10-02 DIAGNOSIS — Z01419 Encounter for gynecological examination (general) (routine) without abnormal findings: Secondary | ICD-10-CM | POA: Diagnosis not present

## 2021-10-02 DIAGNOSIS — M8580 Other specified disorders of bone density and structure, unspecified site: Secondary | ICD-10-CM | POA: Diagnosis not present

## 2021-10-02 DIAGNOSIS — Z6832 Body mass index (BMI) 32.0-32.9, adult: Secondary | ICD-10-CM | POA: Diagnosis not present

## 2021-10-02 DIAGNOSIS — C50911 Malignant neoplasm of unspecified site of right female breast: Secondary | ICD-10-CM | POA: Diagnosis not present

## 2021-10-04 DIAGNOSIS — R41841 Cognitive communication deficit: Secondary | ICD-10-CM | POA: Diagnosis not present

## 2021-10-04 DIAGNOSIS — M6281 Muscle weakness (generalized): Secondary | ICD-10-CM | POA: Diagnosis not present

## 2021-10-04 DIAGNOSIS — R2681 Unsteadiness on feet: Secondary | ICD-10-CM | POA: Diagnosis not present

## 2021-10-07 ENCOUNTER — Telehealth: Payer: Self-pay | Admitting: Internal Medicine

## 2021-10-07 DIAGNOSIS — Z8616 Personal history of COVID-19: Secondary | ICD-10-CM | POA: Diagnosis not present

## 2021-10-07 NOTE — Telephone Encounter (Signed)
Patient notified and will keep upcoming appt

## 2021-10-07 NOTE — Telephone Encounter (Signed)
Pt was instructed to stop Biotin and Vitamins, but took a last dose 10/31 A.M. If patient needs to reschedule appt or needs any further instructions please call pt with info. Pt contact [phone]

## 2021-10-09 ENCOUNTER — Encounter: Payer: Self-pay | Admitting: Physical Medicine & Rehabilitation

## 2021-10-10 ENCOUNTER — Ambulatory Visit (INDEPENDENT_AMBULATORY_CARE_PROVIDER_SITE_OTHER): Payer: Medicare HMO | Admitting: Internal Medicine

## 2021-10-10 ENCOUNTER — Other Ambulatory Visit: Payer: Self-pay

## 2021-10-10 ENCOUNTER — Encounter: Payer: Self-pay | Admitting: Internal Medicine

## 2021-10-10 VITALS — BP 130/82 | HR 78 | Ht 63.0 in | Wt 175.8 lb

## 2021-10-10 DIAGNOSIS — E039 Hypothyroidism, unspecified: Secondary | ICD-10-CM

## 2021-10-10 DIAGNOSIS — E2749 Other adrenocortical insufficiency: Secondary | ICD-10-CM | POA: Diagnosis not present

## 2021-10-10 LAB — BASIC METABOLIC PANEL
BUN: 12 mg/dL (ref 6–23)
CO2: 27 mEq/L (ref 19–32)
Calcium: 9.8 mg/dL (ref 8.4–10.5)
Chloride: 102 mEq/L (ref 96–112)
Creatinine, Ser: 0.73 mg/dL (ref 0.40–1.20)
GFR: 77.08 mL/min (ref >60.00)
Glucose, Bld: 114 mg/dL — ABNORMAL HIGH (ref 70–99)
Potassium: 4.3 mEq/L (ref 3.5–5.1)
Sodium: 139 mEq/L (ref 135–145)

## 2021-10-10 LAB — T4, FREE: Free T4: 0.75 ng/dL (ref 0.60–1.60)

## 2021-10-10 LAB — TSH: TSH: 5.8 u[IU]/mL — ABNORMAL HIGH (ref 0.35–5.50)

## 2021-10-10 LAB — ALBUMIN: Albumin: 4.3 g/dL (ref 3.5–5.2)

## 2021-10-10 NOTE — Progress Notes (Signed)
Name: [name]  MRN/ DOB: [id number], 1941-07-03    Age/ Sex: 80 y.o., female     PCP: Amy Manes, MD   Reason for Endocrinology Evaluation: Adrenal insufficiency      Initial Endocrinology Clinic Visit: 07/10/2021    PATIENT IDENTIFIER: [name] is a 80 y.o., female with a past medical history of CAD, CHF, IBS, hypothyroidism and Hx of breast ca ( S/P lumpectomy and radiation 2010) . She has followed with [facility] since 07/10/2021 for consultative assistance with management of her adrenal insufficiency.   HISTORICAL SUMMARY:  She was diagnosed with Secondary adrenal insufficiency based on abnormal Cosyntropin Stim Test 60 minute cortisol of 11.8 ug/dL in 03/2021. ACTH at the lower end of normal 7 pg/mL.   Of note, the pt  has been on long term left knee intra-articular injections, declines surgery She is on Hydrocodone for pain   We started [facility] due to chronic use of intra-articular injection    THYROID HISTORY: She has also been noted with low TSH with a nadir of 0.01 uIU/mL in 11/2020. Pt on levothyroxine       HYPERCALCEMIA HISTORY: She has been noted to have hypercalcemia on 04/19/2021 with a corrected serum calcium of 10.4 (reference 8.6-10.3). Repeat labs at our clinic 07/1999 showed normal calcium at 10.3 mg, normal ionized calcium 5.45 mg/dl , normal Vit D 47.63 ng/mL as well as normal 1,25 dihydroxy vitamin D 53 pg/mL but low PTH at 12 pg/mL and low PTHrP 10 pg/mL      SUBJECTIVE:    Today (10/10/2021):  [name] is here for adrenal insufficiency, hypothyroidism.   Weight stable  Denies dizziness  Denies vomiting , has occasional morning nausea Has constipation and rare diarrhea   No local neck symptoms  She c/o left knee pain, ,pending left knee procedure in 2023 Hair growth is improving  Cheeks not as puffy     Hydrocortisone 5 mg, 2 tablets with breakfast and 1 tablet in the afternoon levothyroxine 125  MCG daily      HISTORY:  Past Medical History:  Past Medical History:  Diagnosis Date   Adrenal insufficiency ([place]) 07/25/2021   Anxiety    Arrhythmia    right bundle branch block   Bipolar 1 disorder ([place])    Cancer ([place])    Hyperlipidemia    Hypertension    Morbid obesity ([place])    Osteoarthritis    Schizo-affective schizophrenia ([place])    Thyroid disease    hypothyroidism   Past Surgical History:  Past Surgical History:  Procedure Laterality Date   BREAST SURGERY     CHOLECYSTECTOMY     DILATION AND CURETTAGE OF UTERUS     ESOPHAGEAL MANOMETRY N/A 01/29/2018   Procedure: ESOPHAGEAL MANOMETRY (EM);  Surgeon: Wonda Horner, MD;  Location: [facility];  Service: Endoscopy;  Laterality: N/A;   HAND SURGERY     Right-trigger finger   IR THORACENTESIS ASP PLEURAL SPACE W/IMG GUIDE  06/28/2018   IR THORACENTESIS ASP PLEURAL SPACE W/IMG GUIDE  07/19/2018   KNEE SURGERY     Left   TONSILLECTOMY     Social History:  reports that she quit smoking about 42 years ago. Her smoking use included cigarettes. She has never used smokeless tobacco. She reports current alcohol use. She reports that she does not use drugs. Family History:  Family History  Problem Relation Age of Onset   Hypertension Mother    Lung cancer Brother  Schizophrenia Brother      HOME MEDICATIONS: Allergies as of 10/10/2021       Reactions   Lithium Nausea Only   Fluoxetine Other (See Comments)   Pt felt crazy   Macrodantin Other (See Comments)   unknown   Mirabegron Other (See Comments)   ineffective   Other Other (See Comments)   Paroxetine Hcl    Other reaction(s): Other (See Comments) Made pt feel crazy   Paxil [paroxetine] Other (See Comments)   Made pt feel crazy   Prednisone    Makes her feel very jittery   Prozac [fluoxetine Hcl] Other (See Comments)   Pt felt crazy   Sertraline Hcl    Solifenacin Other (See Comments)   Ineffective    Sulfa Antibiotics Other (See Comments)   unknown    Wellbutrin [bupropion Hcl] Other (See Comments)   unknown        Medication List        Accurate as of October 10, 2021 10:09 AM. If you have any questions, ask your nurse or doctor.          acetaminophen 500 MG tablet Commonly known as: TYLENOL 1 tablet as needed   ALPRAZolam 1 MG tablet Commonly known as: XANAX Take 1 mg by mouth at bedtime.   ALPRAZolam 0.5 MG tablet Commonly known as: XANAX Take 1 mg by mouth 3 (three) times daily as needed for anxiety.   amLODipine 2.5 MG tablet Commonly known as: NORVASC Take 2.5 mg by mouth daily.   Biotin 1 MG Caps Take by mouth.   CALCIUM PO Take 2 tablets by mouth daily.   cholecalciferol 25 MCG (1000 UNIT) tablet Commonly known as: VITAMIN D3 Take 1,000 Units by mouth daily. Wasn't sure if 1000 or 2000 units daily   Fish Oil 1000 MG Caps Take 1,000 mg by mouth daily.   gabapentin 100 MG capsule Commonly known as: NEURONTIN TAKE 2 CAPSULES BY MOUTH AT BEDTIME.   HYDROcodone-acetaminophen 5-325 MG tablet Commonly known as: NORCO/VICODIN   hydrocortisone 5 MG tablet Commonly known as: CORTEF TAKE 1 TABLET (5 MG TOTAL) BY MOUTH AS DIRECTED. TAKE 2 TABLETS WITH BREAKFAST AND 1 TABLET EVERY AFTERNOON BETWEEN 2-4 PM   lamoTRIgine 150 MG tablet Commonly known as: LAMICTAL Take 150 mg by mouth every evening.   levothyroxine 137 MCG tablet Commonly known as: SYNTHROID Take 137 mcg by mouth daily before breakfast.   levothyroxine 125 MCG tablet Commonly known as: SYNTHROID Take 125 mcg by mouth daily before breakfast.   memantine 10 MG tablet Commonly known as: NAMENDA Take 10 mg by mouth 2 (two) times daily.   metoprolol tartrate 25 MG tablet Commonly known as: LOPRESSOR Take 25 mg by mouth 2 (two) times daily.   MULTIVITAMIN ADULT PO Take 1 tablet by mouth daily.   OLANZapine 15 MG tablet Commonly known as: ZYPREXA Take 30 mg by mouth at bedtime.   omeprazole 40 MG capsule Commonly known as:  PRILOSEC Take 40 mg by mouth daily.   QUEtiapine 25 MG tablet Commonly known as: SEROQUEL Take 50 mg by mouth at bedtime.   ramelteon 8 MG tablet Commonly known as: ROZEREM Take 8 mg by mouth at bedtime as needed for sleep.   rosuvastatin 20 MG tablet Commonly known as: CRESTOR Take 1 tablet (20 mg total) by mouth daily.   spironolactone 25 MG tablet Commonly known as: ALDACTONE Take 1 tablet (25 mg total) by mouth daily.   VITAMIN B-12 PO Take 1  tablet by mouth daily.          OBJECTIVE:   PHYSICAL EXAM: VS: BP 130/82 (BP Location: Right Arm, Patient Position: Sitting, Cuff Size: Small)   Pulse 78   Ht 5\' 3"  (1.6 m)   Wt 175 lb 12.8 oz (79.7 kg)   SpO2 94%   BMI 31.14 kg/m    EXAM: General: Pt appears well and is in NAD  Lungs: Clear with good BS bilat with no rales, rhonchi, or wheezes  Heart: Auscultation: RRR.  Abdomen: Normoactive bowel sounds, soft, nontender, without masses or organomegaly palpable  Extremities:  BL LE: No pretibial edema normal ROM and strength.  Mental Status: Judgment, insight: Intact Orientation: Oriented to time, place, and person Mood and affect: No depression, anxiety, or agitation     DATA REVIEWED: Results for RAYMA, HEGG (MRN [id number]) as of 10/11/2021 11:16  Ref. Range 10/10/2021 10:40  Sodium Latest Ref Range: 135 - 145 mEq/L 139  Potassium Latest Ref Range: 3.5 - 5.1 mEq/L 4.3  Chloride Latest Ref Range: 96 - 112 mEq/L 102  CO2 Latest Ref Range: 19 - 32 mEq/L 27  Glucose Latest Ref Range: 70 - 99 mg/dL 114 (H)  BUN Latest Ref Range: 6 - 23 mg/dL 12  Creatinine Latest Ref Range: 0.40 - 1.20 mg/dL 0.73  Calcium Latest Ref Range: 8.4 - 10.5 mg/dL 9.8  Albumin Latest Ref Range: 3.5 - 5.2 g/dL 4.3  GFR Latest Ref Range: >60.00 mL/min 77.08  TSH Latest Ref Range: 0.35 - 5.50 uIU/mL 5.80 (H)  T4,Free(Direct) Latest Ref Range: 0.60 - 1.60 ng/dL 0.75      ASSESSMENT / PLAN / RECOMMENDATIONS:    Secondary Adrenal  Insufficiency:   -Patient had an abnormal cosyntropin stimulation test in 04/2021 -I suspect this is secondary adrenal insufficiency due to chronic intra-articular injections as well as chronic opiate use - We started [facility] due to continued use of Intra-articular injections of glucocorticoids and needing coverage between those injection.  - we discussed sick day rules        Medication Hydrocortisone 5 mg, 2 tablets with breakfast and 1 tablet in the afternoon   2. Hypothyroidism   - TSH slightly elevated, will adjust dose below  -Patient advised to hold biotin 2 to 3 days prior to thyroid checks in the future   Increase  levothyroxine 125 MCG , 1.5 tabs on Sundays and 1 tab daily      3. Hypercalcemia :     - She has been noted to have hypercalcemia on 04/19/2021 with a corrected serum calcium of 10.4 (reference 8.6-10.3) - Repeat calcium today is normal  - PTH and Ionized calcium normal     F/U in 6 months     Signed electronically by: Mack Guise, MD  [facility]  [facility] [address] [place], [place] [address] Phone: [phone] FAX: [phone]      CC: [name], [address] [employer] [address] [place] [address] Phone: [phone]  Fax: [phone]   Return to Endocrinology clinic as below: Future Appointments  Date Time Provider [place]  10/10/2021 10:10 AM Evey Mcmahan, Melanie Crazier, MD [facility] None  11/19/2021  9:30 AM Magrinat, Virgie Dad, MD [facility] None  12/10/2021  2:00 PM Kirsteins, Luanna Salk, MD [facility] CPR  01/23/2022  9:15 AM Loel Dubonnet, NP [facility]  04/17/2022  8:50 AM Pieter Partridge, DO [facility] None

## 2021-10-10 NOTE — Patient Instructions (Signed)
-  Continue  Hydrocortisone 5 mg, TWO tablets every morning and ONE tablet every afternoon between  2-4 pm  - Continue Levothyroxine 125 mcg daily   ADRENAL INSUFFICIENCY SICK DAY RULES:  Should you face an extreme emotional or physical stress such as trauma, surgery or acute illness, this will require extra steroid coverage so that the body can meet that stress.   Without increasing the steroid dose you may experience severe weakness, headache, dizziness, nausea and vomiting and possibly a more serious deterioration in health.  Typically the dose of steroids will only need to be increased for a couple of days if you have an illness that is transient and managed in the community.   If you are unable to take/absorb an increased dose of steroids orally because of vomiting or diarrhea, you will urgently require steroid injections and should present to an Emergency Department.  The general advice for any serious illness is as follows: Double the normal daily steroid dose for up to 3 days if you have a temperature of more than 37.50C (99.50F) with signs of sickness, or severe emotional or physical distress Contact your primary care doctor and Endocrinologist if the illness worsens or it lasts for more than 3 days.  In cases of severe illness, urgent medical assistance should be promptly sought. If you experience vomiting/diarrhea or are unable to take steroids by mouth, please administer the Hydrocortisone injection kit and seek urgent medical help.     

## 2021-10-11 ENCOUNTER — Encounter: Payer: Self-pay | Admitting: Internal Medicine

## 2021-10-11 ENCOUNTER — Telehealth: Payer: Self-pay | Admitting: Internal Medicine

## 2021-10-11 DIAGNOSIS — M6281 Muscle weakness (generalized): Secondary | ICD-10-CM | POA: Diagnosis not present

## 2021-10-11 DIAGNOSIS — R41841 Cognitive communication deficit: Secondary | ICD-10-CM | POA: Diagnosis not present

## 2021-10-11 DIAGNOSIS — R2681 Unsteadiness on feet: Secondary | ICD-10-CM | POA: Diagnosis not present

## 2021-10-11 LAB — PARATHYROID HORMONE, INTACT (NO CA): PTH: 21 pg/mL (ref 16–77)

## 2021-10-11 LAB — CALCIUM, IONIZED: Calcium, Ion: 5.2 mg/dL (ref 4.8–5.6)

## 2021-10-11 MED ORDER — LEVOTHYROXINE SODIUM 125 MCG PO TABS: 125.0000 ug | ORAL_TABLET | ORAL | 2 refills | Status: DC

## 2021-10-11 NOTE — Telephone Encounter (Signed)
Vm left for patient to call back.

## 2021-10-11 NOTE — Telephone Encounter (Signed)
Please let the patient know that her kidney function, sodium, potassium, and calcium have all come back normal    Her thyroid is slightly off, the current dose of levothyroxine 125 is not enough at this time.   I would suggest taking 1.5 tablets every Sunday from now on but continue to take 1 tablet Monday through Saturday    Thanks  [place], MD  [facility] Endocrinology  [facility] [address] [place], [place] [address] Phone: [phone] FAX: [phone]

## 2021-10-11 NOTE — Telephone Encounter (Signed)
Patient returned call regarding a VM left for her. Call back number [phone]

## 2021-10-11 NOTE — Telephone Encounter (Signed)
Patient notified and verbalized understanding. 

## 2021-10-14 DIAGNOSIS — Z20828 Contact with and (suspected) exposure to other viral communicable diseases: Secondary | ICD-10-CM | POA: Diagnosis not present

## 2021-10-14 DIAGNOSIS — M1712 Unilateral primary osteoarthritis, left knee: Secondary | ICD-10-CM | POA: Diagnosis not present

## 2021-10-17 DIAGNOSIS — R2681 Unsteadiness on feet: Secondary | ICD-10-CM | POA: Diagnosis not present

## 2021-10-17 DIAGNOSIS — R41841 Cognitive communication deficit: Secondary | ICD-10-CM | POA: Diagnosis not present

## 2021-10-17 DIAGNOSIS — M6281 Muscle weakness (generalized): Secondary | ICD-10-CM | POA: Diagnosis not present

## 2021-10-21 DIAGNOSIS — Z20828 Contact with and (suspected) exposure to other viral communicable diseases: Secondary | ICD-10-CM | POA: Diagnosis not present

## 2021-10-28 DIAGNOSIS — Z20822 Contact with and (suspected) exposure to covid-19: Secondary | ICD-10-CM | POA: Diagnosis not present

## 2021-10-30 ENCOUNTER — Telehealth: Payer: Self-pay | Admitting: Internal Medicine

## 2021-10-30 NOTE — Telephone Encounter (Signed)
Patient notified and verbalized understanding. 

## 2021-10-30 NOTE — Telephone Encounter (Signed)
Pt called to confirm if she should increase hydrocortisone (CORTEF) 5 MG tablet [[id number]]  being she has an cold.

## 2021-10-31 ENCOUNTER — Other Ambulatory Visit: Payer: Self-pay | Admitting: Internal Medicine

## 2021-11-04 DIAGNOSIS — Z1159 Encounter for screening for other viral diseases: Secondary | ICD-10-CM | POA: Diagnosis not present

## 2021-11-04 DIAGNOSIS — Z20828 Contact with and (suspected) exposure to other viral communicable diseases: Secondary | ICD-10-CM | POA: Diagnosis not present

## 2021-11-11 DIAGNOSIS — Z20828 Contact with and (suspected) exposure to other viral communicable diseases: Secondary | ICD-10-CM | POA: Diagnosis not present

## 2021-11-11 DIAGNOSIS — Z1159 Encounter for screening for other viral diseases: Secondary | ICD-10-CM | POA: Diagnosis not present

## 2021-11-18 DIAGNOSIS — Z20828 Contact with and (suspected) exposure to other viral communicable diseases: Secondary | ICD-10-CM | POA: Diagnosis not present

## 2021-11-18 DIAGNOSIS — Z1159 Encounter for screening for other viral diseases: Secondary | ICD-10-CM | POA: Diagnosis not present

## 2021-11-18 NOTE — Progress Notes (Signed)
[facility]  [phone] [phone]    ID: Shylee Durrett Gerdeman DOB: [birth year]  MR#: [id number]  OMV#:[id number]  Patient Care Team: [name], MD as PCP - General (Internal Medicine) Pieter Partridge, DO as Consulting Physician (Neurology) Delsa Bern, MD as Consulting Physician (Obstetrics and Gynecology) Kahmari Herard, Virgie Dad, MD as Consulting Physician (Oncology) Skeet Latch, MD as Attending Physician (Cardiology) Wonda Horner, MD as Consulting Physician (Gastroenterology) Chucky May, MD as Consulting Physician (Psychiatry) Latanya Maudlin, MD as Consulting Physician (Orthopedic Surgery) Rolm Bookbinder, MD as Consulting Physician (Dermatology) Veatrice Bourbon, RT as Technician (Radiology) Chauncey Cruel, MD OTHER MD:  I connected with Eden Emms Clayson on 11/19/21 at  9:30 AM EST by telephone visit and verified that I am speaking with the correct person using two identifiers.   I discussed the limitations, risks, security and privacy concerns of performing an evaluation and management service by telemedicine and the availability of in-person appointments. I also discussed with the patient that there may be a patient responsible charge related to this service. The patient expressed understanding and agreed to proceed.   Other persons participating in the visit and their role in the encounter: none  Patient's location: home  Provider's location: [facility]   I provided 15 minutes of non face-to-face telephone visit time during this encounter, and > 50% was spent counseling as documented under my assessment & plan.   CHIEF COMPLAINT: noninvasive breast cancer, estrogen receptor positive  CURRENT TREATMENT: Observation   INTERVAL HISTORY: Deloma was contacted today for follow up of her noninvasive breast cancer. She opted for observation alone (no surgery, radiation or anti-estrogens).  Since her last visit, she underwent  left diagnostic mammography with tomography at [facility] on 06/12/2021 showing: breast density category B; no change in appearance of biopsy-proven DCIS calcifications in upper-outer left breast.  She is due for bilateral mammography on January and July 2023 at [place]: Tegan just had some friends over for a lunch and enjoyed that. She fell recently but is otherwise steady. A detailed review of systems was otherwise stable.   COVID 19 VACCINATION STATUS: Moderna x2 with booster in July.   RIGHT BREAST CANCER HISTORY: From the original intake note:   Ms. Gritz had routine screening mammography at the [facility] on September 06, 2009. There was a possible abnormality in the upper outer quadrant of the right breast so she was brought back for diagnostic studies September 12, 2009.  Magnification views confirmed a 1.4 spiculated mass in the upper outer quadrant of the right breast with a few microcalcifications associated with it.  By ultrasound, this measured 1.5 cm and was highly suggestive of malignancy.  Biopsy was performed the same day and showed ([id number]) an invasive lobular carcinoma which was 92% ER and 44% PR positive.  The proliferation marker was 13%.  The tumor did not overexpress Her-2 with a ratio of 1.31.     With this information, the patient was referred to Dr. Brantley Stage and after appropriate discussion, she underwent definitive right lumpectomy and axillary sentinel lymph node sampling October 17, 2009.  The final pathology there ([id number]) showed a 1.5 cm invasive lobular carcinoma, grade 1, with negative though closed margins (the in situ component was at 1 mm from the superior, inferior and medial margins) with no evidence of lymphovascular invasion and the single sentinel lymph node clear.  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  HISTORY OF CURRENT ILLNESS: Layney Gillson Rodman has a history of right breast invasive lobular carcinoma, for  which she underwent lumpectomy and radiation in 10/2009.  She did not receive chemo and she did not tolerate antiestrogens.  I followed the patient, and she was subsequently released from follow up in 05/2014. See full history below.  More recently, she had routine screening mammography on 11/29/2020 showing a possible abnormality in the left breast. She underwent left diagnostic mammography with tomography at [facility] on 12/06/2020 showing: breast density category B; 1 cm grouped fine punctate calcifications in upper-outer left breast.  Accordingly on 12/20/2020 she proceeded to biopsy of the left breast area in question.  Post procedure mammography showed the clip at the targeted area.  The pathology from this procedure ([id number]) showed: ductal carcinoma in situ, low grade, with calcifications. Prognostic indicators significant for: estrogen receptor, >95% positive with strong staining intensity and progesterone receptor, 90% positive with moderate-strong staining intensity.   Cancer Staging  Ductal carcinoma in situ (DCIS) of left breast Staging form: Breast, AJCC 8th Edition - Clinical: Stage 0 (cTis (DCIS), cN0, cM0, ER+, PR+) - Signed by Chauncey Cruel, MD on 01/01/2021 Nuclear grade: G1  Malignant neoplasm of overlapping sites of right breast in female, estrogen receptor positive ([place]) Staging form: Breast, AJCC 8th Edition - Clinical: No Stage Recommended (ycT1c, cN0, cM0, G1, ER+, PR+, HER2-) - Signed by Chauncey Cruel, MD on 01/01/2021 Stage prefix: Post-therapy   The patient's subsequent history is as detailed below.   PAST MEDICAL HISTORY: Past Medical History:  Diagnosis Date   Adrenal insufficiency ([place]) 07/25/2021   Anxiety    Arrhythmia    right bundle branch block   Bipolar 1 disorder ([place])    Cancer ([place])    Hyperlipidemia    Hypertension    Morbid obesity ([place])    Osteoarthritis    Schizo-affective schizophrenia ([place])    Thyroid disease    hypothyroidism   History of: dilated cardiomyopathy, bundle branch block, heart murmur, rosacea, psychiatric hospitalization in 1999 for bipolar disorder, chronic schizophrenia, and early Alzheimer's disease   PAST SURGICAL HISTORY: Past Surgical History:  Procedure Laterality Date   BREAST SURGERY     CHOLECYSTECTOMY     DILATION AND CURETTAGE OF UTERUS     ESOPHAGEAL MANOMETRY N/A 01/29/2018   Procedure: ESOPHAGEAL MANOMETRY (EM);  Surgeon: Wonda Horner, MD;  Location: [facility];  Service: Endoscopy;  Laterality: N/A;   HAND SURGERY     Right-trigger finger   IR THORACENTESIS ASP PLEURAL SPACE W/IMG GUIDE  06/28/2018   IR THORACENTESIS ASP PLEURAL SPACE W/IMG GUIDE  07/19/2018   KNEE SURGERY     Left   TONSILLECTOMY      FAMILY HISTORY: Family History  Problem Relation Age of Onset   Hypertension Mother    Lung cancer Brother    Schizophrenia Brother    The patient's mother died from pneumonia at the age of 32 in the setting of Parkinson's disease.  The patient's father died at the age of 75.  The patient's only sibling was a brother who died from lung cancer in his fifties.    She is not aware of any breast ovarian or prostate cancer in the family   GYNECOLOGIC HISTORY:  No LMP recorded. Patient is postmenopausal. Menarche: 80 years old [place] P 0 LMP ~age 38 Contraceptive HRT used for about 2 years  Hysterectomy? no BSO? 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  She is now retired.  She has lived in [place] about 30 years.  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  She is divorced. She lives in [place].  She attends [employer]    ADVANCED DIRECTIVES: The patient has named Verne Grain as her healthcare power of attorney   HEALTH MAINTENANCE: Social History    Tobacco Use   Smoking status: Former    Types: Cigarettes    Quit date: 12/08/1978    Years since quitting: 42.9   Smokeless tobacco: Never  Substance Use Topics   Alcohol use: Yes    Comment: 1 time a year   Drug use: No      Allergies  Allergen Reactions   Lithium Nausea Only   Fluoxetine Other (See Comments)    Pt felt crazy   Macrodantin Other (See Comments)    unknown   Mirabegron Other (See Comments)    ineffective   Other Other (See Comments)   Paroxetine Hcl     Other reaction(s): Other (See Comments) Made pt feel crazy   Paxil [Paroxetine] Other (See Comments)    Made pt feel crazy    Prednisone     Makes her feel very jittery   Prozac [Fluoxetine Hcl] Other (See Comments)    Pt felt crazy    Sertraline Hcl    Solifenacin Other (See Comments)    Ineffective    Sulfa Antibiotics Other (See Comments)    unknown   Wellbutrin [Bupropion Hcl] Other (See Comments)    unknown    Current Outpatient Medications  Medication Sig Dispense Refill   acetaminophen (TYLENOL) 500 MG tablet 1 tablet as needed     ALPRAZolam (XANAX) 0.5 MG tablet Take 1 mg by mouth 3 (three) times daily as needed for anxiety.      ALPRAZolam (XANAX) 1 MG tablet Take 1 mg by mouth at bedtime.     amLODipine (NORVASC) 2.5 MG tablet Take 2.5 mg by mouth daily.     azithromycin (ZITHROMAX) 250 MG tablet 2 tablets on day 1 then 1 tablet daily for 4 days     Biotin 1 MG CAPS Take by mouth.     CALCIUM PO Take 2 tablets by mouth daily.      cholecalciferol (VITAMIN D3) 25 MCG (1000 UT) tablet Take 1,000 Units by mouth daily. Wasn't sure if 1000 or 2000 units daily     Cyanocobalamin (VITAMIN B-12 PO) Take 1 tablet by mouth daily.      gabapentin (NEURONTIN) 100 MG capsule TAKE 2 CAPSULES BY MOUTH AT BEDTIME. 60 capsule 6   guaiFENesin-Codeine 200-10 MG/5ML LIQD 5 mL as needed     HYDROcodone-acetaminophen (NORCO) 10-325 MG tablet hydrocodone 10 mg-acetaminophen 325 mg tablet  Take 1 tablet 4  times a day by oral route as needed.     HYDROcodone-acetaminophen (NORCO/VICODIN) 5-325 MG tablet      hydrocortisone (CORTEF) 5 MG tablet TAKE 1 TABLET (5 MG TOTAL) BY MOUTH AS DIRECTED. TAKE 2 TABLETS WITH BREAKFAST AND 1 TABLET EVERY AFTERNOON BETWEEN 2-4 PM 100 tablet 1   lamoTRIgine (LAMICTAL) 150 MG tablet Take 150 mg by mouth every evening.   12   levothyroxine (SYNTHROID) 125 MCG tablet Take 1 tablet (125 mcg total) by mouth as directed. 1.5 tablet on Sundays,  1 tablet Monday through Saturday 98 tablet 2   memantine (NAMENDA) 10 MG tablet Take 10 mg by mouth 2 (two) times daily.     metoprolol tartrate (LOPRESSOR) 25 MG tablet Take 25 mg by mouth 2 (two) times daily.     Multiple Vitamins-Minerals (MULTIVITAMIN ADULT PO) Take 1 tablet by mouth daily.     OLANZapine (ZYPREXA) 15 MG tablet Take 30 mg by mouth at bedtime.     Omega-3 Fatty Acids (FISH OIL) 1000 MG CAPS Take 1,000 mg by mouth daily.     omeprazole (PRILOSEC) 40 MG capsule Take 40 mg by mouth daily.     QUEtiapine (SEROQUEL) 25 MG tablet Take 50 mg by mouth at bedtime.      ramelteon (ROZEREM) 8 MG tablet Take 8 mg by mouth at bedtime as needed for sleep.     rosuvastatin (CRESTOR) 20 MG tablet rosuvastatin 20 mg tablet  TAKE 1 TABLET BY MOUTH EVERY DAY     spironolactone (ALDACTONE) 25 MG tablet Take 1 tablet (25 mg total) by mouth daily. 90 tablet 3   No current facility-administered medications for this visit.    OBJECTIVE:   There were no vitals filed for this visit.   There is no height or weight on file to calculate BMI.   Wt Readings from Last 3 Encounters:  10/10/21 175 lb 12.8 oz (79.7 kg)  07/25/21 173 lb 9.6 oz (78.7 kg)  07/10/21 175 lb (79.4 kg)     ECOG FS:2 - Symptomatic, <50% confined to bed  Telemedicine visit 11/19/2021  LAB RESULTS:  CMP     Component Value Date/Time   NA 139 10/10/2021 1040   NA 142 05/11/2014 0911   K 4.3 10/10/2021 1040   K 4.1 05/11/2014 0911   CL 102 10/10/2021  1040   CL 105 05/12/2013 1452   CO2 27 10/10/2021 1040   CO2 19 (L) 05/11/2014 0911   GLUCOSE 114 (H) 10/10/2021 1040   GLUCOSE 106 05/11/2014 0911   GLUCOSE 100 (H) 05/12/2013 1452   BUN 12 10/10/2021 1040   BUN 14.9 05/11/2014 0911   CREATININE 0.73 10/10/2021 1040   CREATININE 0.79 01/01/2021 1508   CREATININE 0.89 10/10/2015 0933   CREATININE 0.9 05/11/2014 0911   CALCIUM 9.8 10/10/2021 1040   CALCIUM 9.9 05/11/2014 0911   PROT 7.3 07/10/2021 0941   PROT 6.8 05/11/2014 0911   ALBUMIN 4.3 10/10/2021 1040   ALBUMIN 3.9 05/11/2014 0911   AST 24 07/10/2021 0941   AST 18 01/01/2021 1508   AST 37 (H) 05/11/2014 0911   ALT 26 07/10/2021 0941   ALT 33 01/01/2021 1508   ALT 40 05/11/2014 0911   ALKPHOS 52 07/10/2021 0941   ALKPHOS 52 05/11/2014 0911   BILITOT 0.4 07/10/2021 0941   BILITOT 0.3 01/01/2021 1508   BILITOT 0.33 05/11/2014 0911   GFRNONAA >60 01/01/2021 1508   GFRAA >60 08/20/2020 0333    No results found for: TOTALPROTELP, ALBUMINELP, A1GS, A2GS, BETS, BETA2SER, GAMS, MSPIKE, SPEI  Lab Results  Component Value Date   WBC 9.1 01/01/2021   NEUTROABS 5.5 01/01/2021   HGB 14.4 01/01/2021   HCT 41.5 01/01/2021   MCV 95.0 01/01/2021   PLT 258 01/01/2021    Lab Results  Component Value Date   LABCA2 12 04/01/2012    No components found for: [id number]  No results for input(s): INR in the last 168 hours.  Lab Results  Component Value Date   LABCA2 12 04/01/2012  No results found for: FMM037  No results found for: VOH606  No results found for: VPC340  No results found for: CA2729  No components found for: HGQUANT  No results found for: CEA1 / No results found for: CEA1   No results found for: AFPTUMOR  No results found for: CHROMOGRNA  No results found for: KPAFRELGTCHN, LAMBDASER, KAPLAMBRATIO (kappa/lambda light chains)  No results found for: HGBA, HGBA2QUANT, HGBFQUANT, HGBSQUAN (Hemoglobinopathy evaluation)   Lab Results  Component  Value Date   LDH 207 (H) 08/17/2020    No results found for: IRON, TIBC, IRONPCTSAT (Iron and TIBC)  Lab Results  Component Value Date   FERRITIN 97 08/17/2020    Urinalysis    Component Value Date/Time   COLORURINE YELLOW 08/18/2020 0018   APPEARANCEUR CLEAR 08/18/2020 0018   LABSPEC 1.020 08/18/2020 0018   PHURINE 6.0 08/18/2020 0018   GLUCOSEU NEGATIVE 08/18/2020 0018   HGBUR NEGATIVE 08/18/2020 0018   BILIRUBINUR NEGATIVE 08/18/2020 0018   KETONESUR NEGATIVE 08/18/2020 0018   PROTEINUR NEGATIVE 08/18/2020 0018   NITRITE NEGATIVE 08/18/2020 0018   LEUKOCYTESUR NEGATIVE 08/18/2020 0018    STUDIES: No results found.   ELIGIBLE FOR AVAILABLE RESEARCH PROTOCOL: no  ASSESSMENT: 80 y.o. [place]nie woman with  RIGHT BREAST CANCER:  (1) status post right lumpectomy and sentinel lymph node sampling in November 2010 for a T1cN0, stage IA invasive lobular carcinoma, grade 1, strongly estrogen and progesterone receptor positive, HER-2/neu negative, with low MIB-1.     (2) Status post ADJUVANT radiation therapy, completed in February 2011.   (3) Did not tolerate aromatase anastrozole or letrozole and WAs not felt to be a candidate for tamoxifen--opted for observation alone and was discharged from follow-up 2015  LEFT BREAST CANCER (4) status post left breast biopsy 12/20/2020 for a clinically 1 cm ductal carcinoma in situ, grade 1, strongly estrogen and progesterone receptor positive  (5) refused standard of care lumpectomy, opted for observation alone  (6) she will have bi-annual mammograms in May and November indefinitely   PLAN: Pamula remains very alert mentally and is generally at baseline.  She will have mammography in January and July and she will return to see [place] in July.  We will continue this pattern until and unless there is evidence of disease progression or there are other comorbidities that may make further close follow-up otiose.  She knows to call for any  other issue that may develop before the next visit.   Virgie Dad. Alexiya Franqui, MD 11/19/2021 8:56 AM Medical Oncology and Hematology [facility] [place], [place] [address] Tel. [phone]    Fax. [phone]   This document serves as a record of services personally performed by Lurline Del, MD. It was created on his behalf by Wilburn Mylar, a trained medical scribe. The creation of this record is based on the scribe's personal observations and the provider's statements to them.   I, Lurline Del MD, have reviewed the above documentation for accuracy and completeness, and I agree with the above.   *Total Encounter Time as defined by the Centers for Medicare and Medicaid Services includes, in addition to the face-to-face time of a patient visit (documented in the note above) non-face-to-face time: obtaining and reviewing outside history, ordering and reviewing medications, tests or procedures, care coordination (communications with other health care professionals or caregivers) and documentation in the medical record.

## 2021-11-19 ENCOUNTER — Other Ambulatory Visit: Payer: Self-pay

## 2021-11-19 ENCOUNTER — Inpatient Hospital Stay: Payer: Medicare HMO | Attending: Oncology | Admitting: Oncology

## 2021-11-19 ENCOUNTER — Ambulatory Visit: Payer: Medicare HMO | Admitting: Oncology

## 2021-11-19 ENCOUNTER — Encounter: Payer: Self-pay | Admitting: Physical Medicine & Rehabilitation

## 2021-11-19 ENCOUNTER — Encounter: Payer: Medicare HMO | Attending: Physical Medicine & Rehabilitation | Admitting: Physical Medicine & Rehabilitation

## 2021-11-19 VITALS — BP 134/77 | HR 78 | Ht 63.0 in | Wt 170.0 lb

## 2021-11-19 DIAGNOSIS — D0512 Intraductal carcinoma in situ of left breast: Secondary | ICD-10-CM | POA: Diagnosis not present

## 2021-11-19 DIAGNOSIS — C50811 Malignant neoplasm of overlapping sites of right female breast: Secondary | ICD-10-CM

## 2021-11-19 DIAGNOSIS — M1712 Unilateral primary osteoarthritis, left knee: Secondary | ICD-10-CM | POA: Insufficient documentation

## 2021-11-19 DIAGNOSIS — Z170 Estrogen receptor positive status [ER+]: Secondary | ICD-10-CM | POA: Diagnosis not present

## 2021-11-19 NOTE — Progress Notes (Signed)
Subjective:    Patient ID: [name], female    DOB: 06-08-1941, 80 y.o.   MRN: [id number]  HPI 80 year old female kindly referred by Dr. Nelva Bush for evaluation of left knee pain. The patient has been seeing Dr. Gladstone Lighter orthopedics for osteoarthritis of the right knee for a number of years.  Patient had arthroscopic surgery years ago on the left knee but has never had knee replacement even though this was offered as a potential treatment.  Patient states she wishes to avoid surgery.  She has undergone lumbar sympathetic block on the left side for the chronic knee pain but this was not helpful.  Used to get good relief with corticosteroid injections for the last 3 were of no benefit.  In addition to her knee pain she has a history of low back pain as well as left shoulder pain. Has been through PT and would like to resume this however has excessive pain to participate. Pain is described as constant and aching improves with rest and heat worsened with walking sitting in 1 spot standing in 1 spot.  Her walking tolerance is 5 to 10 minutes with a walker.  She does not drive she does not climb steps.  She was last employed 44 years ago.  She needs assistance with bathing meal prep shopping household duties and dressing.  She does live at [facility] independent living section.  She does no regular exercise Pain Inventory Average Pain 9  Pain Right Now 9  My pain is constant and aching  In the last 24 hours, has pain interfered with the following? General activity 9 Relation with others 9 Enjoyment of life 9 What TIME of day is your pain at its worst? morning  Sleep (in general) Good  Pain is worse with: walking, bending, sitting, inactivity, standing, and some activites Pain improves with: rest, medication, and HEAT Relief from Meds: 2  use a walker how many minutes can you walk? 5-10 Minuts ability to climb steps?  no do you drive?  no use a wheelchair needs help with  transfers  retired I need assistance with the following:  dressing, bathing, meal prep, household duties, and shopping Do you have any goals in this area?  yes  tremor trouble walking confusion depression anxiety  Any changes since last visit?  no  Any changes since last visit?  no    Family History  Problem Relation Age of Onset   Hypertension Mother    Lung cancer Brother    Schizophrenia Brother    Social History   Socioeconomic History   Marital status: Divorced    Spouse name: Not on file   Number of children: Not on file   Years of education: Not on file   Highest education level: Not on file  Occupational History   Not on file  Tobacco Use   Smoking status: Former    Types: Cigarettes    Quit date: 12/08/1978    Years since quitting: 42.9   Smokeless tobacco: Never  Vaping Use   Vaping Use: Never used  Substance and Sexual Activity   Alcohol use: Not Currently    Comment: 1 time a year   Drug use: No   Sexual activity: Not on file  Other Topics Concern   Not on file  Social History Narrative   Lives in [place] (has been there 10 years) - states in independent living / has a Marine scientist helps her with meds  Degrees in English Lit and nursing      No caffeine      Social Determinants of Health   Financial Resource Strain: Not on file  Food Insecurity: Not on file  Transportation Needs: Not on file  Physical Activity: Not on file  Stress: Not on file  Social Connections: Not on file   Past Surgical History:  Procedure Laterality Date   [place] N/A 01/29/2018   Procedure: ESOPHAGEAL MANOMETRY (EM);  Surgeon: Wonda Horner, MD;  Location: [facility];  Service: Endoscopy;  Laterality: N/A;   HAND SURGERY     Right-trigger finger   IR THORACENTESIS ASP PLEURAL SPACE W/IMG GUIDE  06/28/2018   IR THORACENTESIS ASP PLEURAL SPACE W/IMG GUIDE  07/19/2018    KNEE SURGERY     Left   TONSILLECTOMY     Past Medical History:  Diagnosis Date   Adrenal insufficiency ([place]) 07/25/2021   Anxiety    Arrhythmia    right bundle branch block   Bipolar 1 disorder ([facility])    Cancer ([facility])    Hyperlipidemia    Hypertension    Morbid obesity ([facility])    Osteoarthritis    Schizo-affective schizophrenia ([place])    Thyroid disease    hypothyroidism   BP 134/77    Pulse 78    Ht 5\' 3"  (1.6 m)    Wt 170 lb (77.1 kg)    SpO2 95%    BMI 30.11 kg/m   Opioid Risk Score:   Fall Risk Score:  `1  Depression screen PHQ 2/9  Depression screen PHQ 2/9 11/19/2021  Decreased Interest 2  Down, Depressed, Hopeless 1  PHQ - 2 Score 3  Altered sleeping 3  Tired, decreased energy 1  Change in appetite 0  Feeling bad or failure about yourself  0  Trouble concentrating 2  Moving slowly or fidgety/restless 2  Suicidal thoughts 0  PHQ-9 Score 11    Review of Systems  Respiratory:  Positive for shortness of breath.   Musculoskeletal:  Positive for gait problem.       LEFT PAIN  All other systems reviewed and are negative.     Objective:   Physical Exam Vitals and nursing note reviewed.  Constitutional:      Appearance: She is obese.  HENT:     Head: Normocephalic and atraumatic.  Eyes:     Extraocular Movements: Extraocular movements intact.     Conjunctiva/sclera: Conjunctivae normal.     Pupils: Pupils are equal, round, and reactive to light.  Cardiovascular:     Rate and Rhythm: Normal rate and regular rhythm.     Heart sounds: Normal heart sounds.  Pulmonary:     Effort: Pulmonary effort is normal. No respiratory distress.     Breath sounds: Normal breath sounds.  Abdominal:     General: Abdomen is flat. Bowel sounds are normal. There is no distension.     Palpations: Abdomen is soft.  Musculoskeletal:        General: Normal range of motion.     Cervical back: Normal range of motion.     Right lower leg: No edema.     Left lower leg: No edema.   Skin:    General: Skin is warm and dry.  Neurological:     Mental Status: She is alert and oriented to person, place, and time.  Psychiatric:  Mood and Affect: Mood normal.        Behavior: Behavior normal.   Motor strength 5/5 bilateral hip flexor knee extensor ankle dorsiflexor. Left knee has no evidence of effusion there is tenderness along the lateral joint line there is pain with knee extension and she gets actively to -20 degrees but is able to extend passively to full range.  She has normal flexion of the left knee Right knee has no pain with flexion or extension. She ambulates with a rolling walker with a short stepped gait decreased weightbearing on the left side. No evidence of toe drag or knee instability.       Assessment & Plan:   1.  History of osteoarthritis left knee.  The patient used to respond to corticosteroid injection but long no longer does so. She does not wish to pursue surgical treatment.  Her pain does interfere with mobility Discussed treatment options, from a nonsurgical standpoint may benefit from genicular nerve blocks as an evaluation for genicular nerve RF.  I discussed this with the patient as well as a friend of hers who is with her today.  She would like to proceed we will schedule patient next available

## 2021-11-19 NOTE — Patient Instructions (Signed)
Will hope to get you in sooner with a cancellation

## 2021-11-20 ENCOUNTER — Telehealth: Payer: Self-pay | Admitting: Oncology

## 2021-11-20 DIAGNOSIS — F319 Bipolar disorder, unspecified: Secondary | ICD-10-CM | POA: Diagnosis not present

## 2021-11-20 DIAGNOSIS — Z79899 Other long term (current) drug therapy: Secondary | ICD-10-CM | POA: Diagnosis not present

## 2021-11-20 DIAGNOSIS — G301 Alzheimer's disease with late onset: Secondary | ICD-10-CM | POA: Diagnosis not present

## 2021-11-20 DIAGNOSIS — I129 Hypertensive chronic kidney disease with stage 1 through stage 4 chronic kidney disease, or unspecified chronic kidney disease: Secondary | ICD-10-CM | POA: Diagnosis not present

## 2021-11-20 DIAGNOSIS — Z0000 Encounter for general adult medical examination without abnormal findings: Secondary | ICD-10-CM | POA: Diagnosis not present

## 2021-11-20 DIAGNOSIS — E782 Mixed hyperlipidemia: Secondary | ICD-10-CM | POA: Diagnosis not present

## 2021-11-20 DIAGNOSIS — E039 Hypothyroidism, unspecified: Secondary | ICD-10-CM | POA: Diagnosis not present

## 2021-11-20 DIAGNOSIS — F259 Schizoaffective disorder, unspecified: Secondary | ICD-10-CM | POA: Diagnosis not present

## 2021-11-20 DIAGNOSIS — N1831 Chronic kidney disease, stage 3a: Secondary | ICD-10-CM | POA: Diagnosis not present

## 2021-11-20 NOTE — Telephone Encounter (Signed)
Scheduled appointment per 12/13 los. Left message.

## 2021-11-21 ENCOUNTER — Other Ambulatory Visit: Payer: Self-pay | Admitting: Cardiovascular Disease

## 2021-11-22 DIAGNOSIS — Z20828 Contact with and (suspected) exposure to other viral communicable diseases: Secondary | ICD-10-CM | POA: Diagnosis not present

## 2021-11-22 DIAGNOSIS — Z1159 Encounter for screening for other viral diseases: Secondary | ICD-10-CM | POA: Diagnosis not present

## 2021-11-25 DIAGNOSIS — Z1159 Encounter for screening for other viral diseases: Secondary | ICD-10-CM | POA: Diagnosis not present

## 2021-11-25 DIAGNOSIS — Z20828 Contact with and (suspected) exposure to other viral communicable diseases: Secondary | ICD-10-CM | POA: Diagnosis not present

## 2021-11-27 DIAGNOSIS — Z1159 Encounter for screening for other viral diseases: Secondary | ICD-10-CM | POA: Diagnosis not present

## 2021-11-27 DIAGNOSIS — Z20828 Contact with and (suspected) exposure to other viral communicable diseases: Secondary | ICD-10-CM | POA: Diagnosis not present

## 2021-12-02 DIAGNOSIS — Z20828 Contact with and (suspected) exposure to other viral communicable diseases: Secondary | ICD-10-CM | POA: Diagnosis not present

## 2021-12-02 DIAGNOSIS — Z1159 Encounter for screening for other viral diseases: Secondary | ICD-10-CM | POA: Diagnosis not present

## 2021-12-04 DIAGNOSIS — Z20828 Contact with and (suspected) exposure to other viral communicable diseases: Secondary | ICD-10-CM | POA: Diagnosis not present

## 2021-12-04 DIAGNOSIS — Z1159 Encounter for screening for other viral diseases: Secondary | ICD-10-CM | POA: Diagnosis not present

## 2021-12-09 DIAGNOSIS — Z20822 Contact with and (suspected) exposure to covid-19: Secondary | ICD-10-CM | POA: Diagnosis not present

## 2021-12-10 ENCOUNTER — Ambulatory Visit: Payer: Medicare HMO | Admitting: Physical Medicine & Rehabilitation

## 2021-12-15 ENCOUNTER — Other Ambulatory Visit: Payer: Self-pay | Admitting: Cardiovascular Disease

## 2021-12-16 ENCOUNTER — Telehealth: Payer: Self-pay | Admitting: Cardiovascular Disease

## 2021-12-16 DIAGNOSIS — Z20828 Contact with and (suspected) exposure to other viral communicable diseases: Secondary | ICD-10-CM | POA: Diagnosis not present

## 2021-12-16 NOTE — Telephone Encounter (Signed)
Rx request sent to pharmacy.  

## 2021-12-16 NOTE — Telephone Encounter (Signed)
°*  STAT* If patient is at the pharmacy, call can be transferred to refill team.   1. Which medications need to be refilled? (please list name of each medication and dose if known) rosuvastatin (CRESTOR) 20 MG tablet  2. Which pharmacy/location (including street and city if local pharmacy) is medication to be sent to?                                                                                                                                                                                                                                                [id number] - [place], [place] - [address] AT [place]  3. Do they need a 30 day or 90 day supply? [place]

## 2021-12-17 ENCOUNTER — Other Ambulatory Visit: Payer: Self-pay

## 2021-12-17 MED ORDER — ROSUVASTATIN CALCIUM 20 MG PO TABS: 20.0000 mg | ORAL_TABLET | Freq: Every day | ORAL | 0 refills | Status: DC

## 2021-12-18 DIAGNOSIS — Z20828 Contact with and (suspected) exposure to other viral communicable diseases: Secondary | ICD-10-CM | POA: Diagnosis not present

## 2021-12-18 DIAGNOSIS — Z1159 Encounter for screening for other viral diseases: Secondary | ICD-10-CM | POA: Diagnosis not present

## 2021-12-23 DIAGNOSIS — Z20822 Contact with and (suspected) exposure to covid-19: Secondary | ICD-10-CM | POA: Diagnosis not present

## 2021-12-30 DIAGNOSIS — Z20828 Contact with and (suspected) exposure to other viral communicable diseases: Secondary | ICD-10-CM | POA: Diagnosis not present

## 2022-01-06 ENCOUNTER — Other Ambulatory Visit: Payer: Self-pay

## 2022-01-06 DIAGNOSIS — Z20828 Contact with and (suspected) exposure to other viral communicable diseases: Secondary | ICD-10-CM | POA: Diagnosis not present

## 2022-01-09 ENCOUNTER — Encounter: Payer: Medicare HMO | Attending: Physical Medicine & Rehabilitation | Admitting: Physical Medicine & Rehabilitation

## 2022-01-09 ENCOUNTER — Encounter: Payer: Self-pay | Admitting: Physical Medicine & Rehabilitation

## 2022-01-09 ENCOUNTER — Other Ambulatory Visit: Payer: Self-pay

## 2022-01-09 VITALS — BP 180/77 | HR 71 | Ht 63.0 in

## 2022-01-09 DIAGNOSIS — M1712 Unilateral primary osteoarthritis, left knee: Secondary | ICD-10-CM | POA: Insufficient documentation

## 2022-01-09 NOTE — Patient Instructions (Signed)
Genicular nerve blocks were performed today. This is to block pain signals from the knee. A local anesthetic was used to block the knee therefore this will not be a permanent procedure. Please keep track of your pain today and compared to the pain that you had prior to the injection. At next visit we will discuss the results. If it is helpful but only short-term we would need to confirm this with an additional injection prior to proceeding with a radiofrequency neurotomy

## 2022-01-09 NOTE — Progress Notes (Signed)
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 Patient placed supine on the fluoroscopy table AP images of the knee joint were obtained. A 25-gauge 1.5 inch needle was used to anesthetize the skin and subcutaneous tissue with 1% lidocaine, 1 cc at each of 3 locations. Then a 22-gauge 3.5" spinal needle was inserted targeting the junction of the medial flare of the tibia with the shaft of the tibia, bone contact made and confirmed with lateral imaging. Then Isovue 200  x0.5 mL demonstrated no intravascular uptake followed by injection of 1.27ml .25% bupivacaine. Then the junction of the medial epicondyles of the femur with the femoral shaft was targeted needle was advanced under fluoroscopic guidance until bone contact. Appropriate depth was obtained and confirmed with lateral images. Then Isovue 200 x0.5 mL demonstrated no intravascular uptake followed by injection of 1.71ml of .5% bupivacaine. Then the junction of the lateral femoral condyle with the femoral shaft was targeted. 22-gauge 3.5 inch needle was advanced under fluoroscopic guidance until bone contact. Appropriate depth was confirmed with lateral imaging. 0.5 mL of Isovue 200 injected followed by injection of 1.5 cc of .5% bupivacaine solution. Patient tolerated procedure well. Post procedure instructions given   Preinjection pain level 10/10 Postinjection pain level 4/10 Will be scheduled for radiofrequency of the same nerves

## 2022-01-09 NOTE — Progress Notes (Signed)
°  PROCEDURE RECORD [facility]   Name: [name] DOB:Oct 02, 1941 MRN: [id number]  Date:01/09/2022  Physician: Alysia Penna, MD    Nurse/CMA: Ronnald Ramp, RMA  Allergies:  Allergies  Allergen Reactions   Lithium Nausea Only   Fluoxetine Other (See Comments)    Pt felt crazy   Macrodantin Other (See Comments)    unknown   Mirabegron Other (See Comments)    ineffective   Nitrofurantoin     Other reaction(s): Did not agree with me   Other Other (See Comments)   Paroxetine Hcl     Other reaction(s): Other (See Comments) Made pt feel crazy   Paxil [Paroxetine] Other (See Comments)    Made pt feel crazy    Prednisone     Makes her feel very jittery   Prozac [Fluoxetine Hcl] Other (See Comments)    Pt felt crazy    Sertraline Hcl    Solifenacin Other (See Comments)    Ineffective    Sulfa Antibiotics Other (See Comments)    unknown   Wellbutrin [Bupropion Hcl] Other (See Comments)    unknown    Consent Signed: Yes.    Is patient diabetic? No.  CBG today? .  Pregnant: No. LMP: No LMP recorded. Patient is postmenopausal. (age 49-55)  Anticoagulants: no Anti-inflammatory: no Antibiotics: no  Procedure: Left genicular nerve block  Position: Supine Start Time: 3:21 pm  End Time: 3:36 pm  Fluoro Time: 54  RN/CMA Fathima Bartl, RMA Nicco Reaume, RMA    Time 2:58 pm 3:42 pm    BP 172/76 180/77    Pulse 74 71    Respirations 16 18    O2 Sat 96 93    S/S 6 6    Pain Level 10/10 4/10     D/C home with friend, patient A & O X 3, D/C instructions reviewed, and sits independently.

## 2022-01-10 ENCOUNTER — Telehealth: Payer: Self-pay | Admitting: *Deleted

## 2022-01-10 ENCOUNTER — Other Ambulatory Visit: Payer: Self-pay | Admitting: Internal Medicine

## 2022-01-10 NOTE — Telephone Encounter (Signed)
Notified. 

## 2022-01-10 NOTE — Telephone Encounter (Signed)
So the peak effect was 10/10 to 4/10 and then up to 6/10 before wearing off as expected.  I do think she should keep appt for RF as it has a good chance of giving ~50% relieft for 6 mo duration

## 2022-01-10 NOTE — Telephone Encounter (Signed)
[name] called back and reported that after the genicular she went from a 10 to a 6 on pain scale but later that night after lidocaine wore off the pain did return. This morning it is there but not as bad as the original pain. Please advise.

## 2022-01-23 ENCOUNTER — Ambulatory Visit (HOSPITAL_BASED_OUTPATIENT_CLINIC_OR_DEPARTMENT_OTHER): Payer: Medicare HMO | Admitting: Family

## 2022-01-24 ENCOUNTER — Other Ambulatory Visit: Payer: Self-pay | Admitting: Neurology

## 2022-02-14 ENCOUNTER — Other Ambulatory Visit: Payer: Self-pay

## 2022-02-14 ENCOUNTER — Encounter: Payer: Medicare HMO | Attending: Physical Medicine & Rehabilitation | Admitting: Physical Medicine & Rehabilitation

## 2022-02-14 ENCOUNTER — Encounter: Payer: Self-pay | Admitting: Physical Medicine & Rehabilitation

## 2022-02-14 VITALS — BP 127/65 | HR 72 | Temp 98.4°F | Ht 63.0 in

## 2022-02-14 DIAGNOSIS — M1712 Unilateral primary osteoarthritis, left knee: Secondary | ICD-10-CM | POA: Diagnosis not present

## 2022-02-14 NOTE — Patient Instructions (Signed)
Genicular nerve radiofrequency neurotomies  were performed today. This is to block pain signals from the knee. A local anesthetic was used to block the knee and will wear off in 6-8 hrs, use ice 71mn every 2 hrs today , may use tylenol for pain  ?See me in 528mo

## 2022-02-14 NOTE — Progress Notes (Signed)
Thermalgenicular nerve radiofrequency neurotomy under fluoroscopic guidance ? ?Indication Chronic severe  knee pain that has not responded to PT, medication, and other conservative care.THere has been 50% relief of usual knee pain with genicular nerve blocks under fluoroscopic guidance ? ?Informed consent was obtained after discussing risks and benefits of procedure with the patient.  These include bleeding, bruising and infection as well as foot numbness. ?Pt place in supine position on the fluoro table .  Static images identified distal femur and proximal tibia.  Lateral and medial supracondylar , as well as medial tibial flare prepped with betadine.  Marked under fluoro and then a 25 g 1.5 inch needle was used to anesthetize skin and subcutaneous tissue. Lidocaine 1% x1.5 cc was infiltrate into each of 3 sites. Then a 18-gauge 10cm RF needle with a 77m active  tip  was inserted under fluoroscopic guidance first targeting the medial tibial flare midpoint. After bone contact was made lateral images confirmed proper positioning at midpoint of shart ,then Motor stim at 2 Hz demonstrated no motor twitch followed by injection of 137mof 2% lidocaine and radiofrequency lesioning 80 deg for 90 sec. . This same procedure was treated for the lateral and medial supracondylar areas using same equipment and technique. Patient tolerated procedure well. Post procedure instructions given.  ?

## 2022-02-14 NOTE — Progress Notes (Signed)
?  PROCEDURE RECORD ?[facility] ? ? ?Name: [name] ?DOB:1941/05/22 ?MRN: [id number] ? ?Date:02/14/2022  Physician: Alysia Penna, MD   ? ?Nurse/CMA: Ronnald Ramp, RMA ? ?Allergies:  ?Allergies  ?Allergen Reactions  ? Lithium Nausea Only  ? Fluoxetine Other (See Comments)  ?  Pt felt crazy  ? Macrodantin Other (See Comments)  ?  unknown  ? Mirabegron Other (See Comments)  ?  ineffective  ? Nitrofurantoin   ?  Other reaction(s): Did not agree with me  ? Other Other (See Comments)  ? Paroxetine Hcl   ?  Other reaction(s): Other (See Comments) ?Made pt feel crazy  ? Paxil [Paroxetine] Other (See Comments)  ?  Made pt feel crazy ?  ? Prednisone   ?  Makes her feel very jittery  ? Prozac [Fluoxetine Hcl] Other (See Comments)  ?  Pt felt crazy ?  ? Sertraline Hcl   ? Solifenacin Other (See Comments)  ?  Ineffective   ? Sulfa Antibiotics Other (See Comments)  ?  unknown  ? Wellbutrin [Bupropion Hcl] Other (See Comments)  ?  unknown  ? ? ?Consent Signed: Yes.    Is patient diabetic? No.  CBG today? . ? ?Pregnant: No. LMP: No LMP recorded. Patient is postmenopausal. (age 68-55) ? ?Anticoagulants: no ?Anti-inflammatory: no ?Antibiotics: no ? ?Procedure: Left Genicular Radiofrequency  Position: Supine ?Start Time: 11:50 am  End Time: 12:10 pm  Fluoro Time: 52 ? ?RN/CMA Tera Helper, RMA    ?Time 11:28 12;21 pm    ?BP 148 70 127 65    ?Pulse 70 72    ?Respirations 16 16    ?O2 Sat 96 95    ?S/S 6 6    ?Pain Level 10/10 4/10    ? ?D/C home with friend, patient A & O X 3, D/C instructions reviewed, and sits independently. ? ? ? ? ? ? ? ?

## 2022-02-27 ENCOUNTER — Emergency Department (HOSPITAL_COMMUNITY): Payer: Medicare HMO

## 2022-02-27 ENCOUNTER — Encounter (HOSPITAL_COMMUNITY): Payer: Self-pay | Admitting: Emergency Medicine

## 2022-02-27 ENCOUNTER — Other Ambulatory Visit: Payer: Self-pay

## 2022-02-27 ENCOUNTER — Inpatient Hospital Stay (HOSPITAL_COMMUNITY)
Admission: EM | Admit: 2022-02-27 | Discharge: 2022-03-01 | DRG: 683 | Disposition: A | Discharge status: Home Health | Payer: Medicare HMO | Source: Skilled Nursing Facility | Attending: Internal Medicine | Admitting: Internal Medicine

## 2022-02-27 DIAGNOSIS — K219 Gastro-esophageal reflux disease without esophagitis: Secondary | ICD-10-CM | POA: Diagnosis not present

## 2022-02-27 DIAGNOSIS — R110 Nausea: Secondary | ICD-10-CM | POA: Diagnosis not present

## 2022-02-27 DIAGNOSIS — Z818 Family history of other mental and behavioral disorders: Secondary | ICD-10-CM

## 2022-02-27 DIAGNOSIS — E2740 Unspecified adrenocortical insufficiency: Secondary | ICD-10-CM | POA: Diagnosis not present

## 2022-02-27 DIAGNOSIS — F309 Manic episode, unspecified: Secondary | ICD-10-CM | POA: Diagnosis not present

## 2022-02-27 DIAGNOSIS — K582 Mixed irritable bowel syndrome: Secondary | ICD-10-CM | POA: Diagnosis present

## 2022-02-27 DIAGNOSIS — Z20822 Contact with and (suspected) exposure to covid-19: Secondary | ICD-10-CM | POA: Diagnosis present

## 2022-02-27 DIAGNOSIS — Z882 Allergy status to sulfonamides status: Secondary | ICD-10-CM

## 2022-02-27 DIAGNOSIS — F319 Bipolar disorder, unspecified: Secondary | ICD-10-CM | POA: Diagnosis not present

## 2022-02-27 DIAGNOSIS — Z853 Personal history of malignant neoplasm of breast: Secondary | ICD-10-CM

## 2022-02-27 DIAGNOSIS — Z888 Allergy status to other drugs, medicaments and biological substances status: Secondary | ICD-10-CM

## 2022-02-27 DIAGNOSIS — R1111 Vomiting without nausea: Secondary | ICD-10-CM | POA: Diagnosis not present

## 2022-02-27 DIAGNOSIS — F0393 Unspecified dementia, unspecified severity, with mood disturbance: Secondary | ICD-10-CM | POA: Diagnosis present

## 2022-02-27 DIAGNOSIS — F419 Anxiety disorder, unspecified: Secondary | ICD-10-CM

## 2022-02-27 DIAGNOSIS — I110 Hypertensive heart disease with heart failure: Secondary | ICD-10-CM | POA: Diagnosis not present

## 2022-02-27 DIAGNOSIS — I10 Essential (primary) hypertension: Secondary | ICD-10-CM | POA: Diagnosis not present

## 2022-02-27 DIAGNOSIS — E039 Hypothyroidism, unspecified: Secondary | ICD-10-CM | POA: Diagnosis present

## 2022-02-27 DIAGNOSIS — E785 Hyperlipidemia, unspecified: Secondary | ICD-10-CM | POA: Diagnosis not present

## 2022-02-27 DIAGNOSIS — E038 Other specified hypothyroidism: Secondary | ICD-10-CM

## 2022-02-27 DIAGNOSIS — N179 Acute kidney failure, unspecified: Principal | ICD-10-CM | POA: Diagnosis present

## 2022-02-27 DIAGNOSIS — F0394 Unspecified dementia, unspecified severity, with anxiety: Secondary | ICD-10-CM | POA: Diagnosis present

## 2022-02-27 DIAGNOSIS — N281 Cyst of kidney, acquired: Secondary | ICD-10-CM | POA: Diagnosis not present

## 2022-02-27 DIAGNOSIS — Z881 Allergy status to other antibiotic agents status: Secondary | ICD-10-CM

## 2022-02-27 DIAGNOSIS — J189 Pneumonia, unspecified organism: Principal | ICD-10-CM

## 2022-02-27 DIAGNOSIS — I248 Other forms of acute ischemic heart disease: Secondary | ICD-10-CM | POA: Diagnosis not present

## 2022-02-27 DIAGNOSIS — F0150 Vascular dementia without behavioral disturbance: Secondary | ICD-10-CM

## 2022-02-27 DIAGNOSIS — Z8249 Family history of ischemic heart disease and other diseases of the circulatory system: Secondary | ICD-10-CM

## 2022-02-27 DIAGNOSIS — E872 Acidosis, unspecified: Secondary | ICD-10-CM | POA: Diagnosis present

## 2022-02-27 DIAGNOSIS — R531 Weakness: Secondary | ICD-10-CM

## 2022-02-27 DIAGNOSIS — F259 Schizoaffective disorder, unspecified: Secondary | ICD-10-CM | POA: Diagnosis not present

## 2022-02-27 DIAGNOSIS — I4510 Unspecified right bundle-branch block: Secondary | ICD-10-CM | POA: Diagnosis present

## 2022-02-27 DIAGNOSIS — A0811 Acute gastroenteropathy due to Norwalk agent: Secondary | ICD-10-CM | POA: Diagnosis present

## 2022-02-27 DIAGNOSIS — Z79890 Hormone replacement therapy (postmenopausal): Secondary | ICD-10-CM

## 2022-02-27 DIAGNOSIS — Z7952 Long term (current) use of systemic steroids: Secondary | ICD-10-CM

## 2022-02-27 DIAGNOSIS — M1990 Unspecified osteoarthritis, unspecified site: Secondary | ICD-10-CM | POA: Diagnosis present

## 2022-02-27 DIAGNOSIS — E860 Dehydration: Secondary | ICD-10-CM | POA: Diagnosis present

## 2022-02-27 DIAGNOSIS — R112 Nausea with vomiting, unspecified: Secondary | ICD-10-CM

## 2022-02-27 DIAGNOSIS — I5032 Chronic diastolic (congestive) heart failure: Secondary | ICD-10-CM | POA: Diagnosis present

## 2022-02-27 DIAGNOSIS — Z79899 Other long term (current) drug therapy: Secondary | ICD-10-CM

## 2022-02-27 DIAGNOSIS — R197 Diarrhea, unspecified: Secondary | ICD-10-CM

## 2022-02-27 DIAGNOSIS — K8689 Other specified diseases of pancreas: Secondary | ICD-10-CM | POA: Diagnosis not present

## 2022-02-27 DIAGNOSIS — R9431 Abnormal electrocardiogram [ECG] [EKG]: Secondary | ICD-10-CM | POA: Diagnosis not present

## 2022-02-27 DIAGNOSIS — Z87891 Personal history of nicotine dependence: Secondary | ICD-10-CM | POA: Diagnosis not present

## 2022-02-27 DIAGNOSIS — K760 Fatty (change of) liver, not elsewhere classified: Secondary | ICD-10-CM | POA: Diagnosis not present

## 2022-02-27 DIAGNOSIS — C50811 Malignant neoplasm of overlapping sites of right female breast: Secondary | ICD-10-CM

## 2022-02-27 DIAGNOSIS — Z170 Estrogen receptor positive status [ER+]: Secondary | ICD-10-CM

## 2022-02-27 DIAGNOSIS — F0390 Unspecified dementia without behavioral disturbance: Secondary | ICD-10-CM | POA: Diagnosis present

## 2022-02-27 DIAGNOSIS — R059 Cough, unspecified: Secondary | ICD-10-CM | POA: Diagnosis not present

## 2022-02-27 LAB — CBC WITH DIFFERENTIAL/PLATELET
Abs Immature Granulocytes: 0.01 10*3/uL (ref 0.00–0.07)
Basophils Absolute: 0 10*3/uL (ref 0.0–0.1)
Basophils Relative: 0 %
Eosinophils Absolute: 0 10*3/uL (ref 0.0–0.5)
Eosinophils Relative: 1 %
HCT: 48.6 % — ABNORMAL HIGH (ref 36.0–46.0)
Hemoglobin: 16.1 g/dL — ABNORMAL HIGH (ref 12.0–15.0)
Immature Granulocytes: 0 %
Lymphocytes Relative: 11 %
Lymphs Abs: 0.9 10*3/uL (ref 0.7–4.0)
MCH: 31.3 pg (ref 26.0–34.0)
MCHC: 33.1 g/dL (ref 30.0–36.0)
MCV: 94.4 fL (ref 80.0–100.0)
Monocytes Absolute: 0.7 10*3/uL (ref 0.1–1.0)
Monocytes Relative: 8 %
Neutro Abs: 7 10*3/uL (ref 1.7–7.7)
Neutrophils Relative %: 80 %
Platelets: 246 10*3/uL (ref 150–400)
RBC: 5.15 MIL/uL — ABNORMAL HIGH (ref 3.87–5.11)
RDW: 14 % (ref 11.5–15.5)
WBC: 8.6 10*3/uL (ref 4.0–10.5)
nRBC: 0 % (ref 0.0–0.2)

## 2022-02-27 LAB — COMPREHENSIVE METABOLIC PANEL
ALT: 21 U/L (ref 0–44)
AST: 18 U/L (ref 15–41)
Albumin: 4.3 g/dL (ref 3.5–5.0)
Alkaline Phosphatase: 54 U/L (ref 38–126)
Anion gap: 12 (ref 5–15)
BUN: 21 mg/dL (ref 8–23)
CO2: 21 mmol/L — ABNORMAL LOW (ref 22–32)
Calcium: 10.1 mg/dL (ref 8.9–10.3)
Chloride: 108 mmol/L (ref 98–111)
Creatinine, Ser: 1.14 mg/dL — ABNORMAL HIGH (ref 0.44–1.00)
GFR, Estimated: 48 mL/min — ABNORMAL LOW (ref >60)
Glucose, Bld: 159 mg/dL — ABNORMAL HIGH (ref 70–99)
Potassium: 4.1 mmol/L (ref 3.5–5.1)
Sodium: 141 mmol/L (ref 135–145)
Total Bilirubin: 0.7 mg/dL (ref 0.3–1.2)
Total Protein: 7.4 g/dL (ref 6.5–8.1)

## 2022-02-27 LAB — T4, FREE: Free T4: 0.93 ng/dL (ref 0.61–1.12)

## 2022-02-27 LAB — RESP PANEL BY RT-PCR (FLU A&B, COVID) ARPGX2
Influenza A by PCR: NEGATIVE
Influenza B by PCR: NEGATIVE
SARS Coronavirus 2 by RT PCR: NEGATIVE

## 2022-02-27 LAB — LACTIC ACID, PLASMA
Lactic Acid, Venous: 3.1 mmol/L (ref 0.5–1.9)
Lactic Acid, Venous: 2 mmol/L (ref 0.5–1.9)

## 2022-02-27 LAB — LIPASE, BLOOD: Lipase: 38 U/L (ref 11–51)

## 2022-02-27 LAB — TSH: TSH: 3.008 u[IU]/mL (ref 0.350–4.500)

## 2022-02-27 LAB — MRSA NEXT GEN BY PCR, NASAL: MRSA by PCR Next Gen: NOT DETECTED

## 2022-02-27 MED ORDER — AZITHROMYCIN 500 MG IV SOLR
500.0000 mg | Freq: Once | INTRAVENOUS | Status: AC
  Administered 2022-02-27: 500 mg via INTRAVENOUS
  Filled 2022-02-27: qty 5

## 2022-02-27 MED ORDER — ROSUVASTATIN CALCIUM 20 MG PO TABS: 20.0000 mg | ORAL_TABLET | Freq: Every day | ORAL | Status: DC

## 2022-02-27 MED ORDER — ACETAMINOPHEN 500 MG PO TABS
1000.0000 mg | ORAL_TABLET | Freq: Once | ORAL | Status: AC
  Administered 2022-02-27: 1000 mg via ORAL
  Filled 2022-02-27: qty 2

## 2022-02-27 MED ORDER — LEVOTHYROXINE SODIUM 75 MCG PO TABS
187.5000 ug | ORAL_TABLET | ORAL | Status: DC
  Filled 2022-02-27: qty 3

## 2022-02-27 MED ORDER — SODIUM CHLORIDE 0.9 % IV SOLN: INTRAVENOUS | Status: AC

## 2022-02-27 MED ORDER — IOHEXOL 300 MG/ML  SOLN
100.0000 mL | Freq: Once | INTRAMUSCULAR | Status: AC | PRN
  Administered 2022-02-27: 100 mL via INTRAVENOUS

## 2022-02-27 MED ORDER — SODIUM CHLORIDE 0.9 % IV SOLN
2.0000 g | Freq: Once | INTRAVENOUS | Status: AC
  Administered 2022-02-27: 2 g via INTRAVENOUS
  Filled 2022-02-27: qty 20

## 2022-02-27 MED ORDER — ACETAMINOPHEN 325 MG PO TABS
650.0000 mg | ORAL_TABLET | Freq: Four times a day (QID) | ORAL | Status: DC | PRN
  Administered 2022-02-28: 650 mg via ORAL
  Filled 2022-02-27: qty 2

## 2022-02-27 MED ORDER — HYDROCORTISONE 10 MG PO TABS
10.0000 mg | ORAL_TABLET | Freq: Two times a day (BID) | ORAL | Status: DC
Start: 2022-02-28 — End: 2022-02-28
  Administered 2022-02-28: 10 mg via ORAL
  Filled 2022-02-27 (×2): qty 1

## 2022-02-27 MED ORDER — LEVOTHYROXINE SODIUM 25 MCG PO TABS
125.0000 ug | ORAL_TABLET | ORAL | Status: DC
  Administered 2022-02-28 – 2022-03-01 (×2): 125 ug via ORAL
  Filled 2022-02-27 (×2): qty 1

## 2022-02-27 MED ORDER — OLANZAPINE 10 MG PO TABS
30.0000 mg | ORAL_TABLET | Freq: Every day | ORAL | Status: DC
  Administered 2022-02-27 – 2022-02-28 (×2): 30 mg via ORAL
  Filled 2022-02-27 (×3): qty 3

## 2022-02-27 MED ORDER — SODIUM CHLORIDE 0.9 % IV BOLUS
1000.0000 mL | Freq: Once | INTRAVENOUS | Status: AC
Start: 2022-02-27 — End: 2022-02-28
  Administered 2022-02-28: 1000 mL via INTRAVENOUS

## 2022-02-27 MED ORDER — SODIUM CHLORIDE 0.9 % IV BOLUS
1000.0000 mL | Freq: Once | INTRAVENOUS | Status: AC
  Administered 2022-02-27: 1000 mL via INTRAVENOUS

## 2022-02-27 MED ORDER — ALPRAZOLAM 0.25 MG PO TABS
1.0000 mg | ORAL_TABLET | Freq: Every day | ORAL | Status: DC
Start: 2022-02-27 — End: 2022-03-01
  Administered 2022-02-27 – 2022-02-28 (×2): 1 mg via ORAL
  Filled 2022-02-27 (×2): qty 4

## 2022-02-27 MED ORDER — ONDANSETRON HCL 4 MG/2ML IJ SOLN
4.0000 mg | Freq: Once | INTRAMUSCULAR | Status: AC
  Administered 2022-02-27: 4 mg via INTRAVENOUS
  Filled 2022-02-27: qty 2

## 2022-02-27 MED ORDER — SODIUM CHLORIDE 0.9% FLUSH
3.0000 mL | Freq: Two times a day (BID) | INTRAVENOUS | Status: DC
  Administered 2022-02-28 – 2022-03-01 (×2): 3 mL via INTRAVENOUS

## 2022-02-27 MED ORDER — ENOXAPARIN SODIUM 40 MG/0.4ML IJ SOSY
40.0000 mg | PREFILLED_SYRINGE | INTRAMUSCULAR | Status: DC
  Administered 2022-02-27 – 2022-02-28 (×2): 40 mg via SUBCUTANEOUS
  Filled 2022-02-27 (×2): qty 0.4

## 2022-02-27 MED ORDER — ALPRAZOLAM 0.25 MG PO TABS: 1.0000 mg | ORAL_TABLET | Freq: Three times a day (TID) | ORAL | Status: DC | PRN

## 2022-02-27 MED ORDER — GABAPENTIN 100 MG PO CAPS
200.0000 mg | ORAL_CAPSULE | Freq: Every day | ORAL | Status: DC
  Administered 2022-02-27 – 2022-02-28 (×2): 200 mg via ORAL
  Filled 2022-02-27 (×2): qty 2

## 2022-02-27 MED ORDER — PANTOPRAZOLE SODIUM 40 MG PO TBEC
40.0000 mg | DELAYED_RELEASE_TABLET | Freq: Every day | ORAL | Status: DC
  Administered 2022-02-28 – 2022-03-01 (×2): 40 mg via ORAL
  Filled 2022-02-27 (×2): qty 1

## 2022-02-27 MED ORDER — MEMANTINE HCL 10 MG PO TABS
10.0000 mg | ORAL_TABLET | Freq: Two times a day (BID) | ORAL | Status: DC
  Administered 2022-02-27 – 2022-03-01 (×4): 10 mg via ORAL
  Filled 2022-02-27 (×4): qty 1

## 2022-02-27 MED ORDER — METOPROLOL TARTRATE 25 MG PO TABS
25.0000 mg | ORAL_TABLET | Freq: Two times a day (BID) | ORAL | Status: DC
  Administered 2022-02-27 – 2022-03-01 (×4): 25 mg via ORAL
  Filled 2022-02-27 (×4): qty 1

## 2022-02-27 MED ORDER — AMLODIPINE BESYLATE 5 MG PO TABS
2.5000 mg | ORAL_TABLET | Freq: Every day | ORAL | Status: DC
  Administered 2022-03-01: 2.5 mg via ORAL
  Filled 2022-02-27 (×2): qty 1

## 2022-02-27 MED ORDER — FENOFIBRATE 160 MG PO TABS
160.0000 mg | ORAL_TABLET | Freq: Every day | ORAL | Status: DC
  Administered 2022-02-28 – 2022-03-01 (×2): 160 mg via ORAL
  Filled 2022-02-27 (×2): qty 1

## 2022-02-27 MED ORDER — RAMELTEON 8 MG PO TABS
8.0000 mg | ORAL_TABLET | Freq: Every evening | ORAL | Status: DC | PRN
  Administered 2022-02-28: 8 mg via ORAL
  Filled 2022-02-27 (×2): qty 1

## 2022-02-27 MED ORDER — QUETIAPINE FUMARATE 50 MG PO TABS
50.0000 mg | ORAL_TABLET | Freq: Every day | ORAL | Status: DC
  Administered 2022-02-27 – 2022-02-28 (×2): 50 mg via ORAL
  Filled 2022-02-27 (×2): qty 1

## 2022-02-27 MED ORDER — LAMOTRIGINE 150 MG PO TABS
150.0000 mg | ORAL_TABLET | Freq: Every evening | ORAL | Status: DC
Start: 2022-02-27 — End: 2022-03-01
  Administered 2022-02-28 (×2): 150 mg via ORAL
  Filled 2022-02-27 (×4): qty 1

## 2022-02-27 MED ORDER — MORPHINE SULFATE (PF) 2 MG/ML IV SOLN
2.0000 mg | Freq: Once | INTRAVENOUS | Status: AC
Start: 2022-02-27 — End: 2022-02-27
  Administered 2022-02-27: 2 mg via INTRAVENOUS
  Filled 2022-02-27: qty 1

## 2022-02-27 MED ORDER — ACETAMINOPHEN 650 MG RE SUPP: 650.0000 mg | Freq: Four times a day (QID) | RECTAL | Status: DC | PRN

## 2022-02-27 NOTE — ED Triage Notes (Signed)
Per [facility] pt coming from [place] [facility] c/o nausea x 1 week with emesis onset of this morning. C/o generalized abdominal pain and diarrhea. Pt A&0x4.  ?

## 2022-02-27 NOTE — ED Provider Triage Note (Signed)
Emergency Medicine Provider Triage Evaluation Note ? ?[name] , a 81 y.o. female  was evaluated in triage.  Pt complains of vaginal onset, constant, diffuse abdominal pain x1 week.  Patient also complains of watery diarrhea for the past week.  Reports she began vomiting this AM.  Denies any recent sick contacts.  Past surgical history of cholecystectomy several years ago. ? ?Review of Systems  ?Positive: + abd pain, nausea, vomiting, diarrhea ?Negative:  ? ?Physical Exam  ?BP 120/67 (BP Location: Left Arm)   Pulse (!) 111   Temp 98.5 ?F (36.9 ?C) (Oral)   Resp 18   Ht '5\' 3"'$  (1.6 m)   Wt 77.1 kg   SpO2 96%   BMI 30.11 kg/m?  ?Gen:   Awake, no distress   ?Resp:  Normal effort  ?MSK:   Moves extremities without difficulty  ?Other:  Abd soft. Mild diffuse abd TTP ? ?Medical Decision Making  ?Medically screening exam initiated at 3:18 PM.  Appropriate orders placed.  [name] was informed that the remainder of the evaluation will be completed by another provider, this initial triage assessment does not replace that evaluation, and the importance of remaining in the ED until their evaluation is complete. ? ? ?  ?Eustaquio Maize, PA-C ?02/27/22 1520 ? ?

## 2022-02-27 NOTE — Progress Notes (Signed)
Latest Reference Range & Units 02/27/22 20:51  ?Troponin I (High Sensitivity) <18 ng/L 104 (HH)  ?(HH): Data is critically high ?Will Notify MD ?

## 2022-02-27 NOTE — Progress Notes (Signed)
New Admission Note: ? ?Arrival Method: Via stretcher from ED to 80m3 ?Mental Orientation: Alert & Oriented x3 ?Telemetry: CCMD verifed. Box-12 ?Assessment: Completed ?Skin: Refer to flowsheet ?IV: Right FA ?Pain: 0/10 ?Safety Measures: Safety Fall Prevention Plan discussed with patient. ?Admission: Completed ?5BunnOrientation: Patient has been orientated to the room, unit and the staff. ? ?Orders have been reviewed and are being implemented. Will continue to monitor the patient. Call light has been placed within reach and bed alarm has been activated.  ? ?JVassie Moselle RN  ?Phone Number: [phone] ?

## 2022-02-27 NOTE — ED Notes (Signed)
Pt soiled linens while in CT. Linens changed and purewick applied. ?

## 2022-02-27 NOTE — ED Notes (Signed)
ED TO INPATIENT HANDOFF REPORT ? ?ED Nurse Name and Phone #: Kae Heller [phone] ? ?S ?Name/Age/Gender ?[name] ?81 y.o. ?female ?Room/Bed: 008C/008C ? ?Code Status ?  Code Status: Full Code ? ?Home/SNF/Other ?Nursing Home ?Patient oriented to: self, place, time, and situation ?Is this baseline? Yes  ? ?Triage Complete: Triage complete  ?Chief Complaint ?AKI (acute kidney injury) ([place]) [N17.9] ? ?Triage Note ?Per [facility] pt coming from [facility] c/o nausea x 1 week with emesis onset of this morning. C/o generalized abdominal pain and diarrhea. Pt A&0x4.   ? ?Allergies ?Allergies  ?Allergen Reactions  ? Lithium Nausea Only  ? Fluoxetine Other (See Comments)  ?  Pt felt crazy  ? Macrodantin Other (See Comments)  ?  unknown  ? Mirabegron Other (See Comments)  ?  ineffective  ? Nitrofurantoin   ?  Other reaction(s): Did not agree with me  ? Other Other (See Comments)  ? Paroxetine Hcl   ?  Other reaction(s): Other (See Comments) ?Made pt feel crazy  ? Paxil [Paroxetine] Other (See Comments)  ?  Made pt feel crazy ?  ? Prednisone   ?  Makes her feel very jittery  ? Prozac [Fluoxetine Hcl] Other (See Comments)  ?  Pt felt crazy ?  ? Sertraline Hcl   ? Solifenacin Other (See Comments)  ?  Ineffective   ? Sulfa Antibiotics Other (See Comments)  ?  unknown  ? Wellbutrin [Bupropion Hcl] Other (See Comments)  ?  unknown  ? ? ?Level of Care/Admitting Diagnosis ?ED Disposition   ? ? ED Disposition  ?Admit  ? Condition  ?--  ? Comment  ?Hospital Area: [facility] [[id number]] ? Level of Care: Telemetry Medical [104] ? May place patient in observation at [facility] or [place] if equivalent level of care is available:: Yes ? Covid Evaluation: Asymptomatic - no recent exposure (last 10 days) testing not required ? Diagnosis: AKI (acute kidney injury) ([place]) [169450] ? Admitting Physician: Marcelyn Bruins [[id number]] ? Attending Physician: Marcelyn Bruins [[id number]] ?  ?  ? ?   ? ? ?B ?Medical/Surgery History ?Past Medical History:  ?Diagnosis Date  ? Adrenal insufficiency ([place]) 07/25/2021  ? Anxiety   ? Arrhythmia   ? right bundle branch block  ? Bipolar 1 disorder ([place])   ? Cancer [facility])   ? Hyperlipidemia   ? Hypertension   ? Morbid obesity ([place])   ? Osteoarthritis   ? Schizo-affective schizophrenia ([place])   ? Thyroid disease   ? hypothyroidism  ? ?Past Surgical History:  ?Procedure Laterality Date  ? BREAST SURGERY    ? CHOLECYSTECTOMY    ? DILATION AND CURETTAGE OF UTERUS    ? ESOPHAGEAL MANOMETRY N/A 01/29/2018  ? Procedure: ESOPHAGEAL MANOMETRY (EM);  Surgeon: Wonda Horner, MD;  Location: [facility];  Service: Endoscopy;  Laterality: N/A;  ? HAND SURGERY    ? Right-trigger finger  ? IR THORACENTESIS ASP PLEURAL SPACE W/IMG GUIDE  06/28/2018  ? IR THORACENTESIS ASP PLEURAL SPACE W/IMG GUIDE  07/19/2018  ? KNEE SURGERY    ? Left  ? TONSILLECTOMY    ?  ? ?A ?IV Location/Drains/Wounds ?Patient Lines/Drains/Airways Status   ? ? Active Line/Drains/Airways   ? ? Name Placement date Placement time Site Days  ? Peripheral IV 08/17/20 Right Wrist 08/17/20  2025  Wrist  559  ? Peripheral IV 02/27/22 20 G Anterior;Proximal;Right Forearm 02/27/22  1759  Forearm  less than 1  ?  External Urinary Catheter 02/13/19  0628  --  1110  ? External Urinary Catheter 08/18/20  1253  --  558  ? Wound / Incision (Open or Dehisced) 07/24/14 Laceration Lip Lower 07/24/14  0239  Lip  2775  ? ?  ?  ? ?  ? ? ?Intake/Output Last 24 hours ?No intake or output data in the 24 hours ending 02/27/22 2025 ? ?Labs/Imaging ?Results for orders placed or performed during the hospital encounter of 02/27/22 (from the past 48 hour(s))  ?CBC with Differential     Status: Abnormal  ? Collection Time: 02/27/22  3:27 PM  ?Result Value Ref Range  ? WBC 8.6 4.0 - 10.5 K/uL  ? RBC 5.15 (H) 3.87 - 5.11 MIL/uL  ? Hemoglobin 16.1 (H) 12.0 - 15.0 g/dL  ? HCT 48.6 (H) 36.0 - 46.0 %  ? MCV 94.4 80.0 - 100.0 fL  ? MCH 31.3 26.0 - 34.0 pg  ?  MCHC 33.1 30.0 - 36.0 g/dL  ? RDW 14.0 11.5 - 15.5 %  ? Platelets 246 150 - 400 K/uL  ? nRBC 0.0 0.0 - 0.2 %  ? Neutrophils Relative % 80 %  ? Neutro Abs 7.0 1.7 - 7.7 K/uL  ? Lymphocytes Relative 11 %  ? Lymphs Abs 0.9 0.7 - 4.0 K/uL  ? Monocytes Relative 8 %  ? Monocytes Absolute 0.7 0.1 - 1.0 K/uL  ? Eosinophils Relative 1 %  ? Eosinophils Absolute 0.0 0.0 - 0.5 K/uL  ? Basophils Relative 0 %  ? Basophils Absolute 0.0 0.0 - 0.1 K/uL  ? Immature Granulocytes 0 %  ? Abs Immature Granulocytes 0.01 0.00 - 0.07 K/uL  ?  Comment: Performed at [facility], [place] [address] [place], [place] [address]  ?Comprehensive metabolic panel     Status: Abnormal  ? Collection Time: 02/27/22  3:27 PM  ?Result Value Ref Range  ? Sodium 141 135 - 145 mmol/L  ? Potassium 4.1 3.5 - 5.1 mmol/L  ? Chloride 108 98 - 111 mmol/L  ? CO2 21 (L) 22 - 32 mmol/L  ? Glucose, Bld 159 (H) 70 - 99 mg/dL  ?  Comment: Glucose reference range applies only to samples taken after fasting for at least 8 hours.  ? BUN 21 8 - 23 mg/dL  ? Creatinine, Ser 1.14 (H) 0.44 - 1.00 mg/dL  ? Calcium 10.1 8.9 - 10.3 mg/dL  ? Total Protein 7.4 6.5 - 8.1 g/dL  ? Albumin 4.3 3.5 - 5.0 g/dL  ? AST 18 15 - 41 U/L  ? ALT 21 0 - 44 U/L  ? Alkaline Phosphatase 54 38 - 126 U/L  ? Total Bilirubin 0.7 0.3 - 1.2 mg/dL  ? GFR, Estimated 48 (L) >60 mL/min  ?  Comment: (NOTE) ?Calculated using the CKD-EPI Creatinine Equation (2021) ?  ? Anion gap 12 5 - 15  ?  Comment: Performed at [facility], [place] [address] [place], [place] [address]  ?Lipase, blood     Status: None  ? Collection Time: 02/27/22  3:27 PM  ?Result Value Ref Range  ? Lipase 38 11 - 51 U/L  ?  Comment: Performed at [facility], [place] [address], [place], [place] [address]  ?Resp Panel by RT-PCR (Flu A&B, Covid) Nasopharyngeal Swab     Status: None  ? Collection Time: 02/27/22  5:13 PM  ? Specimen: Nasopharyngeal Swab; Nasopharyngeal(NP) swabs in vial transport medium  ?Result Value Ref Range  ?  SARS Coronavirus 2 by RT PCR NEGATIVE NEGATIVE  ?  Comment: (NOTE) ?SARS-CoV-2 target nucleic acids are NOT DETECTED. ? ?The SARS-CoV-2 RNA is generally detectable in upper respiratory ?specimens during the acute phase of infection. The lowest ?concentration of SARS-CoV-2 viral copies this assay can detect is ?138 copies/mL. A negative result does not preclude SARS-Cov-2 ?infection and should not be used as the sole basis for treatment or ?other patient management decisions. A negative result may occur with  ?improper specimen collection/handling, submission of specimen other ?than nasopharyngeal swab, presence of viral mutation(s) within the ?areas targeted by this assay, and inadequate number of viral ?copies(<138 copies/mL). A negative result must be combined with ?clinical observations, patient history, and epidemiological ?information. The expected result is Negative. ? ?Fact Sheet for Patients:  ?EntrepreneurPulse.com.au ? ?Fact Sheet for Healthcare Providers:  ?IncredibleEmployment.be ? ?This test is no t yet approved or cleared by the [place] FDA and  ?has been authorized for detection and/or diagnosis of SARS-CoV-2 by ?FDA under an Emergency Use Authorization (EUA). This EUA will remain  ?in effect (meaning this test can be used) for the duration of the ?COVID-19 declaration under Section 564(b)(1) of the Act, 21 ?U.S.C.section 360bbb-3(b)(1), unless the authorization is terminated  ?or revoked sooner.  ? ? ?  ? Influenza A by PCR NEGATIVE NEGATIVE  ? Influenza B by PCR NEGATIVE NEGATIVE  ?  Comment: (NOTE) ?The Xpert Xpress SARS-CoV-2/FLU/RSV plus assay is intended as an aid ?in the diagnosis of influenza from Nasopharyngeal swab specimens and ?should not be used as a sole basis for treatment. Nasal washings and ?aspirates are unacceptable for Xpert Xpress SARS-CoV-2/FLU/RSV ?testing. ? ?Fact Sheet for Patients: ?EntrepreneurPulse.com.au ? ?Fact  Sheet for Healthcare Providers: ?IncredibleEmployment.be ? ?This test is not yet approved or cleared by the [place] FDA and ?has been authorized for detection and/or diagnosis of SARS-CoV-2 by ?FDA under

## 2022-02-27 NOTE — ED Notes (Signed)
RN attempted to obtain urine specimen from pt. Patient had BM and unable to give specimen at this time.  ?

## 2022-02-27 NOTE — H&P (Addendum)
History and Physical   [name] XBJ:[id number] DOB: 1941-10-10 DOA: 02/27/2022  PCP: Merlene Laughter, MD   Patient coming from: Home  Chief Complaint: Nausea, vomiting, diarrhea  HPI: [name] is a 81 y.o. female with medical history significant of hyperlipidemia, hypothyroidism, dementia, hypertension, IBS, anxiety, schizoaffective schizophrenia, bipolar, diastolic CHF, GERD, breast cancer, adrenal insufficiency presenting with nausea vomiting and diarrhea.  Patient reports for the past week she has been feeling unwell with cough and congestion.  Started having diarrhea around 4 days ago.  Starting this morning, she will experience vomiting.  Has had minimal p.o. intake throughout the day.  She denies any known sick contacts nor unusual foods.  Also reports waxing waning abdominal pain, fevers, chills at home.  She states episode feels similar to previous episode when she was diagnosed with norovirus.  She also states that she stopped taking her opiate pain medication around 5 days ago or so.  Denies any recent antibiotic use.  Denies chest pain, shortness of breath, constipation.  ED Course: Vital signs in the ED significant for heart rate in the 110s to 130s, blood pressure in the 120s to 140s systolic, respiratory rate in the teens to 20s.  Lab work-up included CMP with bicarb 21, creatinine elevated to 1.14 from baseline around 0.8, glucose 159.  CBC with hemoglobin 16.1.  TSH normal, T4 pending.  Lactic acid initially mildly elevated at 3.1 with repeat pending.  Lipase normal.  Respiratory panel for flu and COVID pending.  Urinalysis and blood cultures pending.  Chest x-ray showed no acute abnormality.  CT of the abdomen pelvis showed no acute abnormality of the abdomen pelvis but did demonstrate basilar infiltrate of the lung concerning for infection versus inflammation, as well as a pancreatic lesion with recommendation for imaging follow-up in 2 years.  Review of Systems: As per HPI  otherwise all other systems reviewed and are negative.  Past Medical History:  Diagnosis Date   Adrenal insufficiency (HCC) 07/25/2021   Anxiety    Arrhythmia    right bundle branch block   Bipolar 1 disorder (HCC)    Cancer (HCC)    Hyperlipidemia    Hypertension    Morbid obesity (HCC)    Osteoarthritis    Schizo-affective schizophrenia (HCC)    Thyroid disease    hypothyroidism    Past Surgical History:  Procedure Laterality Date   BREAST SURGERY     CHOLECYSTECTOMY     DILATION AND CURETTAGE OF UTERUS     ESOPHAGEAL MANOMETRY N/A 01/29/2018   Procedure: ESOPHAGEAL MANOMETRY (EM);  Surgeon: Graylin Shiver, MD;  Location: [facility];  Service: Endoscopy;  Laterality: N/A;   HAND SURGERY     Right-trigger finger   IR THORACENTESIS ASP PLEURAL SPACE W/IMG GUIDE  06/28/2018   IR THORACENTESIS ASP PLEURAL SPACE W/IMG GUIDE  07/19/2018   KNEE SURGERY     Left   TONSILLECTOMY      Social History  reports that she quit smoking about 43 years ago. Her smoking use included cigarettes. She has never used smokeless tobacco. She reports that she does not currently use alcohol. She reports that she does not use drugs.  Allergies  Allergen Reactions   Lithium Nausea Only   Fluoxetine Other (See Comments)    Pt felt crazy   Macrodantin Other (See Comments)    unknown   Mirabegron Other (See Comments)    ineffective   Nitrofurantoin     Other reaction(s): Did not agree  with me   Other Other (See Comments)   Paroxetine Hcl     Other reaction(s): Other (See Comments) Made pt feel crazy   Paxil [Paroxetine] Other (See Comments)    Made pt feel crazy    Prednisone     Makes her feel very jittery   Prozac [Fluoxetine Hcl] Other (See Comments)    Pt felt crazy    Sertraline Hcl    Solifenacin Other (See Comments)    Ineffective    Sulfa Antibiotics Other (See Comments)    unknown   Wellbutrin [Bupropion Hcl] Other (See Comments)    unknown    Family History  Problem  Relation Age of Onset   Hypertension Mother    Lung cancer Brother    Schizophrenia Brother   Reviewed on admission  Prior to Admission medications   Medication Sig Start Date End Date Taking? Authorizing Provider  acetaminophen (TYLENOL) 500 MG tablet 1 tablet as needed    [provider]  ALPRAZolam (XANAX) 0.5 MG tablet Take 1 mg by mouth 3 (three) times daily as needed for anxiety.     [provider]  ALPRAZolam Prudy Feeler) 1 MG tablet Take 1 mg by mouth at bedtime. 02/08/21   [provider]  amLODipine (NORVASC) 2.5 MG tablet Take 2.5 mg by mouth daily.    [provider]  Biotin 1 MG CAPS Take by mouth.    [provider]  CALCIUM PO Take 2 tablets by mouth daily.     [provider]  cholecalciferol (VITAMIN D3) 25 MCG (1000 UT) tablet Take 1,000 Units by mouth daily. Wasn't sure if 1000 or 2000 units daily    [provider]  Cyanocobalamin (VITAMIN B-12 PO) Take 1 tablet by mouth daily.     [provider]  gabapentin (NEURONTIN) 100 MG capsule TAKE 2 CAPSULES BY MOUTH AT BEDTIME 01/24/22   Drema Dallas, DO  HYDROcodone-acetaminophen (NORCO) 10-325 MG tablet hydrocodone 10 mg-acetaminophen 325 mg tablet  Take 1 tablet 4 times a day by oral route as needed.    [provider]  hydrocortisone (CORTEF) 5 MG tablet TAKE 2 TABLETS WITH BREAKFAST AND 1 TABLET EVERY AFTERNOON BETWEEN 2-4 PM 01/10/22   Shamleffer, Konrad Dolores, MD  lamoTRIgine (LAMICTAL) 150 MG tablet Take 150 mg by mouth every evening.  04/06/16   [provider]  levothyroxine (SYNTHROID) 125 MCG tablet Take 1 tablet (125 mcg total) by mouth as directed. 1.5 tablet on Sundays, 1 tablet Monday through Saturday 10/11/21   Shamleffer, Konrad Dolores, MD  memantine (NAMENDA) 10 MG tablet Take 10 mg by mouth 2 (two) times daily. 06/21/20   [provider]  metoprolol tartrate (LOPRESSOR) 25 MG tablet Take 25 mg by mouth 2 (two) times  daily.    [provider]  Multiple Vitamins-Minerals (MULTIVITAMIN ADULT PO) Take 1 tablet by mouth daily.    [provider]  OLANZapine (ZYPREXA) 15 MG tablet Take 30 mg by mouth at bedtime.    [provider]  Omega-3 Fatty Acids (FISH OIL) 1000 MG CAPS Take 1,000 mg by mouth daily.    [provider]  omeprazole (PRILOSEC) 40 MG capsule Take 40 mg by mouth daily.    [provider]  QUEtiapine (SEROQUEL) 25 MG tablet Take 50 mg by mouth at bedtime.     [provider]  ramelteon (ROZEREM) 8 MG tablet Take 8 mg by mouth at bedtime as needed for sleep.    [provider]  rosuvastatin (CRESTOR) 20 MG tablet Take 1 tablet (20 mg total) by mouth daily. Need labs from PCP 12/17/21   Chilton Si, MD  spironolactone (ALDACTONE) 25 MG tablet Take 1 tablet (25 mg total) by mouth daily. 06/28/21   Chilton Si, MD    Physical Exam: Vitals:   02/27/22 1730 02/27/22 1930 02/27/22 1945 02/27/22 1956  BP: 125/68 135/63 (!) 133/51   Pulse: (!) 130 (!) 122 (!) 115   Resp: 17 (!) 23 19   Temp:    99 F (37.2 C)  TempSrc:    Oral  SpO2: 97% 96% 94%   Weight:      Height:        Physical Exam Constitutional:      General: She is not in acute distress.    Appearance: Normal appearance.  HENT:     Head: Normocephalic and atraumatic.     Mouth/Throat:     Mouth: Mucous membranes are moist.     Pharynx: Oropharynx is clear.  Eyes:     Extraocular Movements: Extraocular movements intact.     Pupils: Pupils are equal, round, and reactive to light.  Cardiovascular:     Rate and Rhythm: Regular rhythm. Tachycardia present.     Pulses: Normal pulses.     Heart sounds: Murmur heard.  Pulmonary:     Effort: Pulmonary effort is normal. No respiratory distress.     Breath sounds: Normal breath sounds.  Abdominal:     General: Bowel sounds are increased. There is no distension.     Palpations: Abdomen is soft.     Tenderness:  There is no abdominal tenderness.  Musculoskeletal:        General: No swelling or deformity.  Skin:    General: Skin is warm and dry.  Neurological:     General: No focal deficit present.     Mental Status: Mental status is at baseline.   Labs on Admission: I have personally reviewed following labs and imaging studies  CBC: Recent Labs  Lab 02/27/22 1527  WBC 8.6  NEUTROABS 7.0  HGB 16.1*  HCT 48.6*  MCV 94.4  PLT 246    Basic Metabolic Panel: Recent Labs  Lab 02/27/22 1527  NA 141  K 4.1  CL 108  CO2 21*  GLUCOSE 159*  BUN 21  CREATININE 1.14*  CALCIUM 10.1    GFR: Estimated Creatinine Clearance: 38.1 mL/min (A) (by C-G formula based on SCr of 1.14 mg/dL (H)).  Liver Function Tests: Recent Labs  Lab 02/27/22 1527  AST 18  ALT 21  ALKPHOS 54  BILITOT 0.7  PROT 7.4  ALBUMIN 4.3    Urine analysis:    Component Value Date/Time   COLORURINE YELLOW 08/18/2020 0018   APPEARANCEUR CLEAR 08/18/2020 0018   LABSPEC 1.020 08/18/2020 0018   PHURINE 6.0 08/18/2020 0018   GLUCOSEU NEGATIVE 08/18/2020 0018   HGBUR NEGATIVE 08/18/2020 0018   BILIRUBINUR NEGATIVE 08/18/2020 0018   KETONESUR NEGATIVE 08/18/2020 0018   PROTEINUR NEGATIVE 08/18/2020 0018   NITRITE NEGATIVE 08/18/2020 0018   LEUKOCYTESUR NEGATIVE 08/18/2020 0018    Radiological Exams on Admission: CT Abdomen Pelvis W Contrast  Result Date: 02/27/2022 CLINICAL DATA:  Abdominal pain, acute, nonlocalized, nausea, emesis EXAM: CT ABDOMEN AND PELVIS WITH CONTRAST TECHNIQUE: Multidetector CT imaging of the abdomen and pelvis was performed using the standard protocol following bolus administration of intravenous contrast. RADIATION DOSE REDUCTION: This exam was performed according to the departmental dose-optimization program which  includes automated exposure control, adjustment of the mA and/or kV according to patient size and/or use of iterative reconstruction technique. CONTRAST:  OMNIPAQUE  IOHEXOL 300 MG/ML  SOLN COMPARISON:  02/13/2019 FINDINGS: Lower chest: Patchy bibasilar ground-glass pulmonary infiltrate is present, likely infectious or inflammatory, more prevalent within the posterior basal right lower lobe. At least mild coronary artery calcification. Global cardiac size within normal limits. No pericardial effusion. Hepatobiliary: Cholecystectomy has been performed. Mild extrahepatic biliary ductal dilation most likely represents post cholecystectomy change. No intrahepatic biliary ductal dilation. Mild hepatic steatosis. No focal enhancing intrahepatic mass. Pancreas: 8 mm low-attenuation lesion is developed within the pancreatic head which may represent a small intrapancreatic pseudocyst, dilated pancreatic side branch, or cystic neoplasm. The pancreatic duct is not dilated. Otherwise normal enhancement of the pancreatic parenchyma. No peripancreatic inflammatory changes are identified. Spleen: Unremarkable Adrenals/Urinary Tract: The adrenal glands are unremarkable. Multiple tiny cortical cyst noted within the left kidney. The kidneys are otherwise unremarkable. Bladder unremarkable. Stomach/Bowel: Stomach is within normal limits. Appendix appears normal. No evidence of bowel wall thickening, distention, or inflammatory changes. No free intraperitoneal gas or fluid. Vascular/Lymphatic: Aortic atherosclerosis. No enlarged abdominal or pelvic lymph nodes. Reproductive: Uterus and bilateral adnexa are unremarkable. Other: No abdominal wall hernia. Musculoskeletal: Osseous structures are age-appropriate. No lytic or blastic bone lesion. IMPRESSION: No acute intra-abdominal pathology identified. No definite radiographic explanation for the patient's reported symptoms. Bibasilar pulmonary infiltrates, likely infectious or inflammatory in the acute setting. Interval development of 8 mm low-attenuation lesion within the pancreatic head. Recommend follow up pre and post contrast MRI/MRCP or  pancreatic protocol CT in 2 years. This recommendation follows ACR consensus guidelines: Management of Incidental Pancreatic Cysts: A White Paper of the ACR Incidental Findings Committee. J Am Coll Radiol 2017;14:911-923. Aortic Atherosclerosis (ICD10-I70.0). Electronically Signed   By: Helyn Numbers M.D.   On: 02/27/2022 18:57   DG Chest Port 1 View  Result Date: 02/27/2022 CLINICAL DATA:  cough, emesis EXAM: PORTABLE CHEST 1 VIEW COMPARISON:  Chest x-ray 10/29/2020 FINDINGS: The heart and mediastinal contours are unchanged. Aortic calcification. No focal consolidation. Chronic coarsened interstitial markings. No pleural effusion. No pneumothorax. No acute osseous abnormality. IMPRESSION: No active disease. Electronically Signed   By: Tish Frederickson M.D.   On: 02/27/2022 18:07    EKG: Independently reviewed.  Sinus tachycardia at 125 bpm.  Right bundle branch block.  T wave inversion in 1 and aVL, read as possible MI.  Rate makes it difficult to separate similar to previous but rate much faster.  Assessment/Plan Principal Problem:   AKI (acute kidney injury) (HCC) Active Problems:   Hypothyroidism   Dementia (HCC)   HTN (hypertension)   Irritable bowel syndrome with constipation and diarrhea   HLD (hyperlipidemia)   Anxiety   Schizo-affective schizophrenia (HCC)   Chronic diastolic CHF (congestive heart failure) (HCC)   GERD (gastroesophageal reflux disease)   Bipolar 1 disorder (HCC)   Malignant neoplasm of overlapping sites of right breast in female, estrogen receptor positive (HCC)   Adrenal insufficiency (HCC)   AKI > Presenting with nausea vomiting diarrhea as below with evidence of AKI with creatinine elevated to 1.14 from baseline of 0.8. > Decreased p.o. intake secondary to nausea vomiting diarrhea as below. > Received 2 L of IV fluid in the ED.   -Monitor on telemetry -Trend renal function electrolytes -Continue IV fluids overnight  Nausea vomiting and diarrhea >  Presenting with 1 day of these symptoms, unclear etiology at this time. >  With some waxing waning abdominal pain as well.  No evidence of abnormality on CT. > States feels similar to episode where she was diagnosed with norovirus. > Also reports stopping her opioid medication 5 days ago with some concern for possible withdrawal contributing to diarrhea, however has not had other withdrawal symptoms (no significant anxiety, sweating, restlessness, change in pupil size, aches, tearing, tremor, yawning) > No recent antibiotic use. Hx of IBS in chart. - Monitor on telemetry - Continue supportive care - Check GI pathogen panel  ?Pneumonia > Evidence of infectious versus inflammatory basilar infiltrates on CT scan not seen on chest x-ray. > No leukocytosis however patient is on chronic hydrocortisone for adrenal insufficiency. > Lactic acid was elevated to 3 in the ED however this in setting of AKI, nausea, vomiting, diarrhea. > Received ceftriaxone azithromycin in the ED. - Has coverage with antibiotics through tomorrow afternoon, will hold off on reordering for now - Procalcitonin - Check respiratory viral panel - Trend fever curve and white count - Trend lactic acid  Abnormal EKG > EKG somewhat difficult to evaluate given rate.  Does appear somewhat similar to previous however there are some T wave inversions and some ST changes. - We will check troponin to rule out cardiac damage  Hyperlipidemia - Continue home rosuvastatin  Hypothyroidism - Continue home fenofibrate  Hypertension - Continue home amlodipine and metoprolol - Hold home spironolactone in the setting of AKI  Anxiety Schizoaffective schizophrenia Bipolar - Continue home Zyprexa, quetiapine, Lamictal - Continue home Xanax - Continue home ramelteon  Dementia - Continue home Namenda  Diastolic CHF > Echo in 2021 with EF 70-75% and normal RV function. - Not on any diuretics - Continue to monitor  GERD -  Continue home PPI  Adrenal sufficiency - Continue home hydrocortisone  Abnormal CT > Pancreatic lesion noted on CT scan.  Recommendation for follow-up imaging in 2 years.  Breast cancer history - Noted   DVT prophylaxis: Lovenox Code Status:   Full Family Communication:  None on admission.  She states she does not have any family area and that her friends are up-to-date and are aware she is in hospital. Disposition Plan:   Patient is from:  Home  Anticipated DC to:  Home  Anticipated DC date:  1 to 3 days  Anticipated DC barriers: None  Consults called:  None Admission status:  Observation, telemetry  Severity of Illness: The appropriate patient status for this patient is OBSERVATION. Observation status is judged to be reasonable and necessary in order to provide the required intensity of service to ensure the patient's safety. The patient's presenting symptoms, physical exam findings, and initial radiographic and laboratory data in the context of their medical condition is felt to place them at decreased risk for further clinical deterioration. Furthermore, it is anticipated that the patient will be medically stable for discharge from the hospital within 2 midnights of admission.   Amy Fail MD [facility]  How to contact the [facility] Attending or Consulting provider 7A - 7P or covering provider during after hours 7P -7A, for this patient?   Check the care team in [facility] and look for a) attending/consulting [facility] provider listed and b) the [facility] team listed Log into [email or web address] and use [place]'s universal password to access. If you do not have the password, please contact the hospital operator. Locate the [facility] provider you are looking for under [facility] and page to a number that you can be directly reached. If you  still have difficulty reaching the provider, please page the [facility] (Director on Call) for the Hospitalists listed on amion for assistance.  02/27/2022,  8:13 PM

## 2022-02-27 NOTE — ED Provider Notes (Signed)
?[facility] ?Provider Note ? ? ?CSN: [id number] ?Arrival date & time: 02/27/22  1459 ? ?  ? ?History ? ?Chief Complaint  ?Patient presents with  ? Emesis  ? ? ?[name] is a 81 y.o. female. ? ?81 yo F with a cc of n/v/d.  Going on since this morning.  She tells me that she has been coughing and congested for about a week or so.  Started having worsening vomiting this morning and has not really been able to keep anything down.  Has had multiple bowel movements as well.  No one around her is sick that she knows of.  Denies suspicious food intake.  Waning of diffuse abdominal discomfort.  Having fevers and chills at home. ? ? ?Emesis ? ?  ? ?Home Medications ?Prior to Admission medications   ?Medication Sig Start Date End Date Taking? Authorizing Provider  ?acetaminophen (TYLENOL) 500 MG tablet 1 tablet as needed    [provider]  ?ALPRAZolam (XANAX) 0.5 MG tablet Take 1 mg by mouth 3 (three) times daily as needed for anxiety.     [provider]  ?ALPRAZolam Duanne Moron) 1 MG tablet Take 1 mg by mouth at bedtime. 02/08/21   [provider]  ?amLODipine (NORVASC) 2.5 MG tablet Take 2.5 mg by mouth daily.    [provider]  ?Biotin 1 MG CAPS Take by mouth.    [provider]  ?CALCIUM PO Take 2 tablets by mouth daily.     [provider]  ?cholecalciferol (VITAMIN D3) 25 MCG (1000 UT) tablet Take 1,000 Units by mouth daily. Wasn't sure if 1000 or 2000 units daily    [provider]  ?Cyanocobalamin (VITAMIN B-12 PO) Take 1 tablet by mouth daily.     [provider]  ?gabapentin (NEURONTIN) 100 MG capsule TAKE 2 CAPSULES BY MOUTH AT BEDTIME 01/24/22   Pieter Partridge, DO  ?HYDROcodone-acetaminophen (NORCO) 10-325 MG tablet hydrocodone 10 mg-acetaminophen 325 mg tablet ? Take 1 tablet 4 times a day by oral route as needed.    [provider]  ?hydrocortisone (CORTEF) 5 MG tablet TAKE 2 TABLETS WITH  BREAKFAST AND 1 TABLET EVERY AFTERNOON BETWEEN 2-4 PM 01/10/22   Shamleffer, Melanie Crazier, MD  ?lamoTRIgine (LAMICTAL) 150 MG tablet Take 150 mg by mouth every evening.  04/06/16   [provider]  ?levothyroxine (SYNTHROID) 125 MCG tablet Take 1 tablet (125 mcg total) by mouth as directed. 1.5 tablet on Sundays, 1 tablet Monday through Saturday 10/11/21   Shamleffer, Melanie Crazier, MD  ?memantine (NAMENDA) 10 MG tablet Take 10 mg by mouth 2 (two) times daily. 06/21/20   [provider]  ?metoprolol tartrate (LOPRESSOR) 25 MG tablet Take 25 mg by mouth 2 (two) times daily.    [provider]  ?Multiple Vitamins-Minerals (MULTIVITAMIN ADULT PO) Take 1 tablet by mouth daily.    [provider]  ?OLANZapine (ZYPREXA) 15 MG tablet Take 30 mg by mouth at bedtime.    [provider]  ?Omega-3 Fatty Acids (FISH OIL) 1000 MG CAPS Take 1,000 mg by mouth daily.    [provider]  ?omeprazole (PRILOSEC) 40 MG capsule Take 40 mg by mouth daily.    [provider]  ?QUEtiapine (SEROQUEL) 25 MG tablet Take 50 mg by mouth at bedtime.     [provider]  ?ramelteon (ROZEREM) 8 MG tablet Take 8 mg by mouth at bedtime as needed for sleep.  [provider]  ?rosuvastatin (CRESTOR) 20 MG tablet Take 1 tablet (20 mg total) by mouth daily. Need labs from PCP 12/17/21   Skeet Latch, MD  ?spironolactone (ALDACTONE) 25 MG tablet Take 1 tablet (25 mg total) by mouth daily. 06/28/21   Skeet Latch, MD  ?   ? ?Allergies    ?Lithium, Fluoxetine, Macrodantin, Mirabegron, Nitrofurantoin, Other, Paroxetine hcl, Paxil [paroxetine], Prednisone, Prozac [fluoxetine hcl], Sertraline hcl, Solifenacin, Sulfa antibiotics, and Wellbutrin [bupropion hcl]   ? ?Review of Systems   ?Review of Systems  ?Gastrointestinal:  Positive for vomiting.  ? ?Physical Exam ?Updated Vital Signs ?BP (!) 133/51   Pulse (!) 115   Temp 99 ?F (37.2 ?C) (Oral)   Resp 19   Ht '5\' 3"'$   (1.6 m)   Wt 77.1 kg   SpO2 94%   BMI 30.11 kg/m?  ?Physical Exam ?Vitals and nursing note reviewed.  ?Constitutional:   ?   General: She is not in acute distress. ?   Appearance: She is well-developed. She is not diaphoretic.  ?HENT:  ?   Head: Normocephalic and atraumatic.  ?Eyes:  ?   Pupils: Pupils are equal, round, and reactive to light.  ?Cardiovascular:  ?   Rate and Rhythm: Regular rhythm. Tachycardia present.  ?   Heart sounds: No murmur heard. ?  No friction rub. No gallop.  ?Pulmonary:  ?   Effort: Pulmonary effort is normal.  ?   Breath sounds: No wheezing or rales.  ?Abdominal:  ?   General: There is no distension.  ?   Palpations: Abdomen is soft.  ?   Tenderness: There is no abdominal tenderness.  ?   Comments: No obvious abdominal discomfort on exam but patient tells me it hurts all over.  ?Musculoskeletal:     ?   General: No tenderness.  ?   Cervical back: Normal range of motion and neck supple.  ?Skin: ?   General: Skin is warm and dry.  ?Neurological:  ?   Mental Status: She is alert and oriented to person, place, and time.  ?Psychiatric:     ?   Behavior: Behavior normal.  ? ? ?ED Results / Procedures / Treatments   ?Labs ?(all labs ordered are listed, but only abnormal results are displayed) ?Labs Reviewed  ?CBC WITH DIFFERENTIAL/PLATELET - Abnormal; Notable for the following components:  ?    Result Value  ? RBC 5.15 (*)   ? Hemoglobin 16.1 (*)   ? HCT 48.6 (*)   ? All other components within normal limits  ?COMPREHENSIVE METABOLIC PANEL - Abnormal; Notable for the following components:  ? CO2 21 (*)   ? Glucose, Bld 159 (*)   ? Creatinine, Ser 1.14 (*)   ? GFR, Estimated 48 (*)   ? All other components within normal limits  ?LACTIC ACID, PLASMA - Abnormal; Notable for the following components:  ? Lactic Acid, Venous 3.1 (*)   ? All other components within normal limits  ?RESP PANEL BY RT-PCR (FLU A&B, COVID) [id number]  ?CULTURE, BLOOD (ROUTINE X 2)  ?CULTURE, BLOOD (ROUTINE X 2)  ?RESPIRATORY  PANEL BY PCR  ?GASTROINTESTINAL PANEL BY PCR, STOOL (REPLACES STOOL CULTURE)  ?LIPASE, BLOOD  ?TSH  ?T4, FREE  ?URINALYSIS, ROUTINE W REFLEX MICROSCOPIC  ?LACTIC ACID, PLASMA  ?COMPREHENSIVE METABOLIC PANEL  ?CBC  ? ? ?EKG ?None ? ?Radiology ?CT Abdomen Pelvis W Contrast ? ?Result Date: 02/27/2022 ?CLINICAL DATA:  Abdominal pain, acute, nonlocalized, nausea, emesis EXAM: CT ABDOMEN AND  PELVIS WITH CONTRAST TECHNIQUE: Multidetector CT imaging of the abdomen and pelvis was performed using the standard protocol following bolus administration of intravenous contrast. RADIATION DOSE REDUCTION: This exam was performed according to the departmental dose-optimization program which includes automated exposure control, adjustment of the mA and/or kV according to patient size and/or use of iterative reconstruction technique. CONTRAST:  157m OMNIPAQUE IOHEXOL 300 MG/ML  SOLN COMPARISON:  02/13/2019 FINDINGS: Lower chest: Patchy bibasilar ground-glass pulmonary infiltrate is present, likely infectious or inflammatory, more prevalent within the posterior basal right lower lobe. At least mild coronary artery calcification. Global cardiac size within normal limits. No pericardial effusion. Hepatobiliary: Cholecystectomy has been performed. Mild extrahepatic biliary ductal dilation most likely represents post cholecystectomy change. No intrahepatic biliary ductal dilation. Mild hepatic steatosis. No focal enhancing intrahepatic mass. Pancreas: 8 mm low-attenuation lesion is developed within the pancreatic head which may represent a small intrapancreatic pseudocyst, dilated pancreatic side branch, or cystic neoplasm. The pancreatic duct is not dilated. Otherwise normal enhancement of the pancreatic parenchyma. No peripancreatic inflammatory changes are identified. Spleen: Unremarkable Adrenals/Urinary Tract: The adrenal glands are unremarkable. Multiple tiny cortical cyst noted within the left kidney. The kidneys are otherwise  unremarkable. Bladder unremarkable. Stomach/Bowel: Stomach is within normal limits. Appendix appears normal. No evidence of bowel wall thickening, distention, or inflammatory changes. No free intraperitoneal gas or fluid.

## 2022-02-28 DIAGNOSIS — E872 Acidosis, unspecified: Secondary | ICD-10-CM | POA: Diagnosis present

## 2022-02-28 DIAGNOSIS — R112 Nausea with vomiting, unspecified: Secondary | ICD-10-CM | POA: Diagnosis present

## 2022-02-28 DIAGNOSIS — F319 Bipolar disorder, unspecified: Secondary | ICD-10-CM | POA: Diagnosis present

## 2022-02-28 DIAGNOSIS — E039 Hypothyroidism, unspecified: Secondary | ICD-10-CM | POA: Diagnosis present

## 2022-02-28 DIAGNOSIS — Z20822 Contact with and (suspected) exposure to covid-19: Secondary | ICD-10-CM | POA: Diagnosis present

## 2022-02-28 DIAGNOSIS — Z818 Family history of other mental and behavioral disorders: Secondary | ICD-10-CM | POA: Diagnosis not present

## 2022-02-28 DIAGNOSIS — K582 Mixed irritable bowel syndrome: Secondary | ICD-10-CM | POA: Diagnosis present

## 2022-02-28 DIAGNOSIS — A0811 Acute gastroenteropathy due to Norwalk agent: Secondary | ICD-10-CM | POA: Diagnosis present

## 2022-02-28 DIAGNOSIS — E785 Hyperlipidemia, unspecified: Secondary | ICD-10-CM | POA: Diagnosis present

## 2022-02-28 DIAGNOSIS — F259 Schizoaffective disorder, unspecified: Secondary | ICD-10-CM | POA: Diagnosis present

## 2022-02-28 DIAGNOSIS — K219 Gastro-esophageal reflux disease without esophagitis: Secondary | ICD-10-CM | POA: Diagnosis present

## 2022-02-28 DIAGNOSIS — F0393 Unspecified dementia, unspecified severity, with mood disturbance: Secondary | ICD-10-CM | POA: Diagnosis present

## 2022-02-28 DIAGNOSIS — N179 Acute kidney failure, unspecified: Secondary | ICD-10-CM | POA: Diagnosis present

## 2022-02-28 DIAGNOSIS — Z8249 Family history of ischemic heart disease and other diseases of the circulatory system: Secondary | ICD-10-CM | POA: Diagnosis not present

## 2022-02-28 DIAGNOSIS — E2740 Unspecified adrenocortical insufficiency: Secondary | ICD-10-CM | POA: Diagnosis present

## 2022-02-28 DIAGNOSIS — Z881 Allergy status to other antibiotic agents status: Secondary | ICD-10-CM | POA: Diagnosis not present

## 2022-02-28 DIAGNOSIS — I4510 Unspecified right bundle-branch block: Secondary | ICD-10-CM | POA: Diagnosis present

## 2022-02-28 DIAGNOSIS — M1990 Unspecified osteoarthritis, unspecified site: Secondary | ICD-10-CM | POA: Diagnosis present

## 2022-02-28 DIAGNOSIS — I248 Other forms of acute ischemic heart disease: Secondary | ICD-10-CM | POA: Diagnosis present

## 2022-02-28 DIAGNOSIS — E860 Dehydration: Secondary | ICD-10-CM | POA: Diagnosis present

## 2022-02-28 DIAGNOSIS — F0394 Unspecified dementia, unspecified severity, with anxiety: Secondary | ICD-10-CM | POA: Diagnosis present

## 2022-02-28 DIAGNOSIS — I5032 Chronic diastolic (congestive) heart failure: Secondary | ICD-10-CM | POA: Diagnosis present

## 2022-02-28 DIAGNOSIS — Z853 Personal history of malignant neoplasm of breast: Secondary | ICD-10-CM | POA: Diagnosis not present

## 2022-02-28 DIAGNOSIS — I110 Hypertensive heart disease with heart failure: Secondary | ICD-10-CM | POA: Diagnosis present

## 2022-02-28 DIAGNOSIS — R531 Weakness: Secondary | ICD-10-CM

## 2022-02-28 DIAGNOSIS — Z87891 Personal history of nicotine dependence: Secondary | ICD-10-CM | POA: Diagnosis not present

## 2022-02-28 LAB — COMPREHENSIVE METABOLIC PANEL
ALT: 17 U/L (ref 0–44)
AST: 19 U/L (ref 15–41)
Albumin: 3 g/dL — ABNORMAL LOW (ref 3.5–5.0)
Alkaline Phosphatase: 39 U/L (ref 38–126)
Anion gap: 8 (ref 5–15)
BUN: 20 mg/dL (ref 8–23)
CO2: 21 mmol/L — ABNORMAL LOW (ref 22–32)
Calcium: 8.6 mg/dL — ABNORMAL LOW (ref 8.9–10.3)
Chloride: 111 mmol/L (ref 98–111)
Creatinine, Ser: 0.89 mg/dL (ref 0.44–1.00)
GFR, Estimated: >60 mL/min (ref >60)
Glucose, Bld: 112 mg/dL — ABNORMAL HIGH (ref 70–99)
Potassium: 3.3 mmol/L — ABNORMAL LOW (ref 3.5–5.1)
Sodium: 140 mmol/L (ref 135–145)
Total Bilirubin: 0.5 mg/dL (ref 0.3–1.2)
Total Protein: 5.5 g/dL — ABNORMAL LOW (ref 6.5–8.1)

## 2022-02-28 LAB — C DIFFICILE QUICK SCREEN W PCR REFLEX
C Diff antigen: NEGATIVE
C Diff interpretation: NOT DETECTED
C Diff toxin: NEGATIVE

## 2022-02-28 LAB — GASTROINTESTINAL PANEL BY PCR, STOOL (REPLACES STOOL CULTURE)

## 2022-02-28 LAB — RESPIRATORY PANEL BY PCR

## 2022-02-28 LAB — CBC
HCT: 38.3 % (ref 36.0–46.0)
Hemoglobin: 13 g/dL (ref 12.0–15.0)
MCH: 31.5 pg (ref 26.0–34.0)
MCHC: 33.9 g/dL (ref 30.0–36.0)
MCV: 92.7 fL (ref 80.0–100.0)
Platelets: 191 10*3/uL (ref 150–400)
RBC: 4.13 MIL/uL (ref 3.87–5.11)
RDW: 14.6 % (ref 11.5–15.5)
WBC: 8.8 10*3/uL (ref 4.0–10.5)
nRBC: 0 % (ref 0.0–0.2)

## 2022-02-28 LAB — TROPONIN I (HIGH SENSITIVITY)
Troponin I (High Sensitivity): 104 ng/L (ref <18)
Troponin I (High Sensitivity): 85 ng/L — ABNORMAL HIGH (ref <18)

## 2022-02-28 LAB — PROCALCITONIN: Procalcitonin: 2.51 ng/mL

## 2022-02-28 MED ORDER — TRAZODONE HCL 50 MG PO TABS
50.0000 mg | ORAL_TABLET | Freq: Every evening | ORAL | Status: DC | PRN
Start: 2022-02-28 — End: 2022-02-28

## 2022-02-28 MED ORDER — OXYCODONE HCL 5 MG PO TABS
5.0000 mg | ORAL_TABLET | ORAL | Status: DC | PRN
  Administered 2022-02-28 (×2): 5 mg via ORAL
  Filled 2022-02-28 (×2): qty 1

## 2022-02-28 MED ORDER — HYDROCORTISONE 10 MG PO TABS
10.0000 mg | ORAL_TABLET | Freq: Every day | ORAL | Status: DC
  Administered 2022-03-01: 10 mg via ORAL
  Filled 2022-02-28: qty 1

## 2022-02-28 MED ORDER — GUAIFENESIN 100 MG/5ML PO LIQD: 5.0000 mL | ORAL | Status: DC | PRN

## 2022-02-28 MED ORDER — POTASSIUM CHLORIDE 10 MEQ/100ML IV SOLN
10.0000 meq | INTRAVENOUS | Status: AC
  Administered 2022-02-28 (×4): 10 meq via INTRAVENOUS
  Filled 2022-02-28 (×4): qty 100

## 2022-02-28 MED ORDER — HYDROCORTISONE 5 MG PO TABS
5.0000 mg | ORAL_TABLET | ORAL | Status: DC
  Administered 2022-02-28: 5 mg via ORAL
  Filled 2022-02-28 (×2): qty 1

## 2022-02-28 MED ORDER — IPRATROPIUM-ALBUTEROL 0.5-2.5 (3) MG/3ML IN SOLN: 3.0000 mL | RESPIRATORY_TRACT | Status: DC | PRN

## 2022-02-28 MED ORDER — HYDRALAZINE HCL 20 MG/ML IJ SOLN: 10.0000 mg | INTRAMUSCULAR | Status: DC | PRN

## 2022-02-28 MED ORDER — METOPROLOL TARTRATE 5 MG/5ML IV SOLN
5.0000 mg | INTRAVENOUS | Status: DC | PRN
Start: 2022-02-28 — End: 2022-03-01

## 2022-02-28 MED ORDER — DIAZEPAM 5 MG PO TABS
5.0000 mg | ORAL_TABLET | Freq: Two times a day (BID) | ORAL | Status: DC | PRN
  Administered 2022-02-28 – 2022-03-01 (×2): 5 mg via ORAL
  Filled 2022-02-28 (×2): qty 1

## 2022-02-28 MED ORDER — SENNOSIDES-DOCUSATE SODIUM 8.6-50 MG PO TABS: 1.0000 | ORAL_TABLET | Freq: Every evening | ORAL | Status: DC | PRN

## 2022-02-28 MED ORDER — SODIUM CHLORIDE 0.9 % IV SOLN: INTRAVENOUS | Status: AC

## 2022-02-28 NOTE — TOC Progression Note (Signed)
Transition of Care (TOC) - Progression Note  ? ? ?Patient Details  ?Name: [name] ?MRN: [id number] ?Date of Birth: [birth year] ? ?Transition of Care (TOC) CM/SW Contact  ?Tom-Johnson, Renea Ee, RN ?Phone Number: ?02/28/2022, 3:19 PM ? ?Clinical Narrative:    ? ?CM consulted for home health. Patient is from [facility] [place]. CM contacted [facility] per patient's request to refer for home health. CM's call was transferred to [place] and a secure voicemail left for a return call. CM awaiting their callback. TOC will continue to follow with needs. ? ? ?Expected Discharge Plan: Assisted Living (With home health) ?Barriers to Discharge: Continued Medical Work up ? ?Expected Discharge Plan and Services ?Expected Discharge Plan: Assisted Living (With home health) ?  ?Discharge Planning Services: CM Consult ?Post Acute Care Choice: Home Health ?Living arrangements for the past 2 months: [place] ?                ?  ?  ?  ?  ?  ?HH Arranged: PT ?[facility] Agency: Other - See comment [employer] home health at [facility].) ?Date HH Agency Contacted: 02/28/22 ?Time [place]: 1510 ?  ? ? ?Social Determinants of Health (SDOH) Interventions ?  ? ?Readmission Risk Interventions ?   ? View : No data to display.  ?  ?  ?  ? ? ?

## 2022-02-28 NOTE — Evaluation (Signed)
Occupational Therapy Evaluation Patient Details Name: [name] MRN: [id number] DOB: 1941/05/14 Today's Date: 02/28/2022   History of Present Illness 81 y/o female presented to ED on 02/27/22 for c/o nausea x 1 week with emesis. Found to have AKI. PMH: anxiety, schizoaffective disorder, diastolic CHF, bipolar disorder, breast cancer, HTN, dementia   Clinical Impression   Prior to this admission, patient was living at [facility] in assisted living with CNAs coming for 2 hours/day. Of note, patient stating she lives in independent living however with history of dementia and upon further chart review confirming she lives in assisted living. Currently, patient is min A for bed mobility, transfers and mod A for ADLs. Patient requiring max verbal cues in order to motor plan with rolling walker, and unaware she was incontinent of bladder, stating she had a purewick and diaper on, neither were in place. OT recommending HHOT at assisted living at discharge. OT will continue to follow acutely to address deficits.        Recommendations for follow up therapy are one component of a multi-disciplinary discharge planning process, led by the attending physician.  Recommendations may be updated based on patient status, additional functional criteria and insurance authorization.   Follow Up Recommendations  Home health OT    Assistance Recommended at Discharge Frequent or constant Supervision/Assistance  Patient can return home with the following A little help with walking and/or transfers;A little help with bathing/dressing/bathroom;Assistance with cooking/housework;Direct supervision/assist for medications management;Direct supervision/assist for financial management;Assist for transportation;Help with stairs or ramp for entrance    Functional Status Assessment  Patient has had a recent decline in their functional status and demonstrates the ability to make significant improvements in function in a  reasonable and predictable amount of time.  Equipment Recommendations  None recommended by OT (Patient has DME needed)    Recommendations for Other Services       Precautions / Restrictions Precautions Precautions: Fall Restrictions Weight Bearing Restrictions: No      Mobility Bed Mobility Overal bed mobility: Needs Assistance Bed Mobility: Supine to Sit, Sit to Supine     Supine to sit: Min assist Sit to supine: Min guard   General bed mobility comments: minA for trunk elevation but patient able to reposition hips towards EOB    Transfers Overall transfer level: Needs assistance Equipment used: Rolling walker (2 wheels) Transfers: Sit to/from Stand Sit to Stand: Min guard           General transfer comment: min guard for safety. use of momentum to stand with both hands on RW      Balance Overall balance assessment: Needs assistance Sitting-balance support: No upper extremity supported, Feet supported Sitting balance-Leahy Scale: Fair     Standing balance support: Bilateral upper extremity supported, Reliant on assistive device for balance Standing balance-Leahy Scale: Poor Standing balance comment: reliant on UE support                           ADL either performed or assessed with clinical judgement   ADL Overall ADL's : Needs assistance/impaired Eating/Feeding: Set up;Sitting   Grooming: Standing;Cueing for sequencing;Wash/dry hands;Wash/dry face   Upper Body Bathing: Set up;Sitting   Lower Body Bathing: Minimal assistance;Sitting/lateral leans   Upper Body Dressing : Set up;Sitting   Lower Body Dressing: Minimal assistance;Sitting/lateral leans Lower Body Dressing Details (indicate cue type and reason): Patient able to complete, but poor balance when attempting Toilet Transfer: Minimal assistance;Regular Toilet;Ambulation;Rolling walker (2  wheels) Toilet Transfer Details (indicate cue type and reason): Poor motor planning with  ambulation, max cues to maintain attention to task Toileting- Clothing Manipulation and Hygiene: Total assistance Toileting - Clothing Manipulation Details (indicate cue type and reason): patient unaware she was incontinent of bladder, stating she had a purewick and diaper on, neither were in place     Functional mobility during ADLs: Minimal assistance;Cueing for safety;Cueing for sequencing;Rolling walker (2 wheels) General ADL Comments: Patient presenting with decreased activity tolerance, and need for max verbal cues to motor plan     Vision Baseline Vision/History: 0 No visual deficits Ability to See in Adequate Light: 0 Adequate Patient Visual Report: No change from baseline       Perception     Praxis      Pertinent Vitals/Pain Pain Assessment Pain Assessment: No/denies pain     Hand Dominance     Extremity/Trunk Assessment Upper Extremity Assessment Upper Extremity Assessment: Generalized weakness   Lower Extremity Assessment Lower Extremity Assessment: Defer to PT evaluation;Generalized weakness   Cervical / Trunk Assessment Cervical / Trunk Assessment: Kyphotic   Communication Communication Communication: No difficulties   Cognition Arousal/Alertness: Awake/alert Behavior During Therapy: WFL for tasks assessed/performed Overall Cognitive Status: History of cognitive impairments - at baseline                                 General Comments: hx of dementia     General Comments  VSS on RA    Exercises     Shoulder Instructions      Home Living Family/patient expects to be discharged to:: Assisted living Living Arrangements: Alone                           Home Equipment: Rollator (4 wheels)   Additional Comments: patient states she lives in ILF at [facility] but during conversation, patient seems to require assistance from CNA for ADLs      Prior Functioning/Environment Prior Level of Function : Needs assist              Mobility Comments: patient states she uses rollator for mobility ADLs Comments: states she has CNA who comes in 2 hours/day and assists with ADLs        OT Problem List: Decreased strength;Decreased range of motion;Decreased activity tolerance;Impaired balance (sitting and/or standing);Decreased coordination;Decreased cognition;Decreased safety awareness;Decreased knowledge of use of DME or AE;Decreased knowledge of precautions      OT Treatment/Interventions: Self-care/ADL training;Therapeutic exercise;Energy conservation;DME and/or AE instruction;Manual therapy;Therapeutic activities;Patient/family education;Balance training    OT Goals(Current goals can be found in the care plan section) Acute Rehab OT Goals Patient Stated Goal: to go home OT Goal Formulation: With patient Time For Goal Achievement: 03/14/22 Potential to Achieve Goals: Good  OT Frequency: Min 2X/week    Co-evaluation PT/OT/SLP Co-Evaluation/Treatment: Yes Reason for Co-Treatment: Necessary to address cognition/behavior during functional activity;For patient/therapist safety PT goals addressed during session: Balance;Mobility/safety with mobility;Proper use of DME OT goals addressed during session: ADL's and self-care;Proper use of Adaptive equipment and DME      AM-PAC OT "6 Clicks" Daily Activity     Outcome Measure Help from another person eating meals?: A Little Help from another person taking care of personal grooming?: A Little Help from another person toileting, which includes using toliet, bedpan, or urinal?: A Lot Help from another person bathing (including washing, rinsing, drying)?: A Little  Help from another person to put on and taking off regular upper body clothing?: A Little Help from another person to put on and taking off regular lower body clothing?: A Little 6 Click Score: 17   End of Session Equipment Utilized During Treatment: Gait belt;Rolling walker (2 wheels) Nurse Communication:  Mobility status  Activity Tolerance: Patient tolerated treatment well Patient left: in bed;with call bell/phone within reach;with bed alarm set  OT Visit Diagnosis: Unsteadiness on feet (R26.81);Other abnormalities of gait and mobility (R26.89);Muscle weakness (generalized) (M62.81)                Time: 1340-1401 OT Time Calculation (min): 21 min Charges:  OT General Charges $OT Visit: 1 Visit OT Evaluation $OT Eval Moderate Complexity: 1 Mod  Pollyann Glen E. Duchess Armendarez, OTR/L Acute Rehabilitation Services [phone]   Cherlyn Cushing 02/28/2022, 3:41 PM

## 2022-02-28 NOTE — Evaluation (Signed)
Physical Therapy Evaluation ?Patient Details ?Name: [name] ?MRN: [id number] ?DOB: 03-Apr-1941 ?Today's Date: 02/28/2022 ? ?History of Present Illness ? 81 y/o female presented to ED on 02/27/22 for c/o nausea x 1 week with emesis. Found to have AKI. PMH: anxiety, schizoaffective disorder, diastolic CHF, bipolar disorder, breast cancer, HTN, dementia  ?Clinical Impression ? Patient admitted with the above. Patient states she is from [place] but through conversation, seems like she is in the ALF portion due to requiring assist for ADLs each day. Patient currently presents with generalized weakness, impaired balance, decreased activity tolerance, and impaired cognition. Patient requires minA for room ambulation with RW due to balance and poor RW management by patient. Anticipate patient is near baseline but no family/caregiver present to provide information on PLOF. Patient will benefit from skilled PT services during acute stay to address listed deficits. Recommend HHPT at discharge to maximize functional mobility and safety.  ?   ? ?Recommendations for follow up therapy are one component of a multi-disciplinary discharge planning process, led by the attending physician.  Recommendations may be updated based on patient status, additional functional criteria and insurance authorization. ? ?Follow Up Recommendations Home health PT ? ?  ?Assistance Recommended at Discharge Frequent or constant Supervision/Assistance  ?Patient can return home with the following ? Direct supervision/assist for medications management;Direct supervision/assist for financial management;Assistance with cooking/housework ? ?  ?Equipment Recommendations None recommended by PT  ?Recommendations for Other Services ?    ?  ?Functional Status Assessment Patient has had a recent decline in their functional status and demonstrates the ability to make significant improvements in function in a reasonable and predictable amount of time.  ? ?   ?Precautions / Restrictions Precautions ?Precautions: Fall ?Restrictions ?Weight Bearing Restrictions: No  ? ?  ? ?Mobility ? Bed Mobility ?Overal bed mobility: Needs Assistance ?Bed Mobility: Supine to Sit, Sit to Supine ?  ?  ?Supine to sit: Min assist ?Sit to supine: Min guard ?  ?General bed mobility comments: minA for trunk elevation but patient able to reposition hips towards EOB ?  ? ?Transfers ?Overall transfer level: Needs assistance ?Equipment used: Rolling Jamoni Hewes (2 wheels) ?Transfers: Sit to/from Stand ?Sit to Stand: Min guard ?  ?  ?  ?  ?  ?General transfer comment: min guard for safety. use of momentum to stand with both hands on RW ?  ? ?Ambulation/Gait ?Ambulation/Gait assistance: Min assist ?Gait Distance (Feet): 20 Feet ?Assistive device: Rolling Calista Crain (2 wheels) ?Gait Pattern/deviations: Step-through pattern, Decreased stride length ?Gait velocity: decreased ?  ?  ?General Gait Details: minA for balance and RW management. patient tends to push RW too far out in front of her and forgets it when turning. Requires frequent cueing for RW management ? ?Stairs ?  ?  ?  ?  ?  ? ?Wheelchair Mobility ?  ? ?Modified Rankin (Stroke Patients Only) ?  ? ?  ? ?Balance Overall balance assessment: Needs assistance ?Sitting-balance support: No upper extremity supported, Feet supported ?Sitting balance-Leahy Scale: Fair ?  ?  ?Standing balance support: Bilateral upper extremity supported, Reliant on assistive device for balance ?Standing balance-Leahy Scale: Poor ?Standing balance comment: reliant on UE support ?  ?  ?  ?  ?  ?  ?  ?  ?  ?  ?  ?   ? ? ? ?Pertinent Vitals/Pain Pain Assessment ?Pain Assessment: No/denies pain  ? ? ?Home Living Family/patient expects to be discharged to:: Assisted living ?Living Arrangements: Alone ?  ?  ?  ?  ?  ?  ?  ?  Home Equipment: Rollator (4 wheels) ?Additional Comments: patient states she lives in [place] at [facility] but during conversation, patient seems to require  assistance from CNA for ADLs  ?  ?Prior Function Prior Level of Function : Needs assist ?  ?  ?  ?  ?  ?  ?Mobility Comments: patient states she uses rollator for mobility ?ADLs Comments: states she has CNA who comes in 2 hours/day and assists with ADLs ?  ? ? ?Hand Dominance  ?   ? ?  ?Extremity/Trunk Assessment  ? Upper Extremity Assessment ?Upper Extremity Assessment: Defer to OT evaluation ?  ? ?Lower Extremity Assessment ?Lower Extremity Assessment: Generalized weakness ?  ? ?Cervical / Trunk Assessment ?Cervical / Trunk Assessment: Kyphotic  ?Communication  ? Communication: No difficulties  ?Cognition Arousal/Alertness: Awake/alert ?Behavior During Therapy: [facility] for tasks assessed/performed ?Overall Cognitive Status: History of cognitive impairments - at baseline ?  ?  ?  ?  ?  ?  ?  ?  ?  ?  ?  ?  ?  ?  ?  ?  ?General Comments: hx of dementia ?  ?  ? ?  ?General Comments General comments (skin integrity, edema, etc.): VSS on RA ? ?  ?Exercises    ? ?Assessment/Plan  ?  ?PT Assessment Patient needs continued PT services  ?PT Problem List Decreased strength;Decreased activity tolerance;Decreased balance;Decreased mobility;Decreased cognition;Decreased knowledge of use of DME;Decreased safety awareness ? ?   ?  ?PT Treatment Interventions Gait training;DME instruction;Functional mobility training;Therapeutic activities;Therapeutic exercise;Balance training;Patient/family education   ? ?PT Goals (Current goals can be found in the Care Plan section)  ?Acute Rehab PT Goals ?Patient Stated Goal: did not state ?PT Goal Formulation: Patient unable to participate in goal setting ?Time For Goal Achievement: 03/14/22 ?Potential to Achieve Goals: Fair ? ?  ?Frequency Min 3X/week ?  ? ? ?Co-evaluation PT/OT/SLP Co-Evaluation/Treatment: Yes ?Reason for Co-Treatment: Necessary to address cognition/behavior during functional activity;For patient/therapist safety ?PT goals addressed during session: Balance;Mobility/safety with  mobility;Proper use of DME ?  ?  ? ? ?  ?AM-PAC PT "6 Clicks" Mobility  ?Outcome Measure Help needed turning from your back to your side while in a flat bed without using bedrails?: A Little ?Help needed moving from lying on your back to sitting on the side of a flat bed without using bedrails?: A Little ?Help needed moving to and from a bed to a chair (including a wheelchair)?: A Little ?Help needed standing up from a chair using your arms (e.g., wheelchair or bedside chair)?: A Little ?Help needed to walk in hospital room?: A Little ?Help needed climbing 3-5 steps with a railing? : A Lot ?6 Click Score: 17 ? ?  ?End of Session Equipment Utilized During Treatment: Gait belt ?Activity Tolerance: Patient tolerated treatment well ?Patient left: in bed;with call bell/phone within reach;with bed alarm set ?Nurse Communication: Mobility status ?PT Visit Diagnosis: Unsteadiness on feet (R26.81);Muscle weakness (generalized) (M62.81) ?  ? ?Time: 1339-1401 ?PT Time Calculation (min) (ACUTE ONLY): 22 min ? ? ?Charges:   PT Evaluation ?$PT Eval Moderate Complexity: 1 Mod ?  ?  ?   ? ? ?Andromeda Poppen A. Gilford Rile, PT, DPT ?Acute Rehabilitation Services ?Pager [phone] ?Office [phone] ? ? ?Remigio Mcmillon A Keymani Glynn ?02/28/2022, 2:18 PM ? ?

## 2022-02-28 NOTE — Progress Notes (Signed)
?PROGRESS NOTE ? ? ? ?[name]  JOI:[id number] DOB: May 06, 1941 DOA: 02/27/2022 ?PCP: [name], MD  ? ?Brief Narrative:  ?81 year old with past medical history of HLD, hypothyroidism, dementia, HTN, IBS, schizoaffective disorder, anxiety, diastolic CHF, GERD, breast cancer, adrenal insufficiency admitted to the hospital for nausea, vomiting, diarrhea and acute kidney injury.  Her symptoms started 4 days prior to the admission.  Upon admission she was noted to be clinically dehydrated, with lactic acidosis and tachycardia.  CT abdomen pelvis did not show any acute abnormality but showed bibasilar infiltrates in the lungs and small pancreatic head lesion. ? ? ?Assessment & Plan: ? Principal Problem: ?  AKI (acute kidney injury) ([place]) ?Active Problems: ?  Hypothyroidism ?  Dementia ([place]) ?  HTN (hypertension) ?  Irritable bowel syndrome with constipation and diarrhea ?  HLD (hyperlipidemia) ?  Anxiety ?  Schizo-affective schizophrenia ([place]) ?  Chronic diastolic CHF (congestive heart failure) ([place]) ?  GERD (gastroesophageal reflux disease) ?  Bipolar 1 disorder ([place]) ?  Nausea vomiting and diarrhea ?  Malignant neoplasm of overlapping sites of right breast in female, estrogen receptor positive ([place]) ?  Adrenal insufficiency ([place]) ?  ?AKI ?-Likely prerenal in nature.  Baseline creatinine 0.8, admission creatinine 1.14.  Resolved with IV fluids. ? ?Nausea vomiting and diarrhea ?-Unclear etiology.  Will check stool studies including GI panel and C. difficile ?- Supportive care, IV fluids as necessary. ?-CT abdomen pelvis negative for any acute pathology.  Showed small pancreatic head lesion. ?-Lipase and LFTs within normal limits. ? ?Bibasilar infiltrates ?-Seen on CT scan.  Received empiric Rocephin/azithromycin in ED ?- Procalcitonin- ?- Respiratory panel- ?- Bronchodilators, I-S/flutter valve. ? ?Abnormal EKG ?Troponins are trending downward.  Likely demand ischemia.  EKG does not appear to be having any acute  changes.  Does have prolonged QTc.  We will repeat ?  ?Hyperlipidemia ?-Continue rosuvastatin ? ?Hypothyroidism ?-Fenofibrate ? ?Hypertension ?-On Norvasc, Toprol. ?Aldactone was held due to AKI ? ?Anxiety ?Schizoaffective schizophrenia ?Bipolar ?-Continue home meds ? ?Dementia ?-Namenda ?  ?Diastolic CHF, compensated ?> Echo in 2021 with EF 70-75% and normal RV function. ?-Not on any diuretics.  Continue to monitor ? ?GERD ?- Continue home PPI ? ?Adrenal sufficiency ?-Home hydrocortisone ?  ?Abnormal CT ?-Recommend follow-up imaging with MRCP in about 2 years ? ?Breast cancer history ?- Noted  ? ? ?DVT prophylaxis: enoxaparin (LOVENOX) injection 40 mg Start: 02/27/22 2015 ?Code Status: Full code ?Family Communication:   ? ?Still having diarrhea ongoing work-up to rule out C. difficile and any other GI pathogens.  Maintain hospital stay. ? ? ?Subjective: ?Patient thinks her mouth is very dry.  Continues to have watery stools.  Also reporting of generalized body ache especially in her knees which has been chronic for her. ? ? ?Examination: ? ?General exam: Appears calm and comfortable, dry mouth ?Respiratory system: Clear to auscultation. Respiratory effort normal. ?Cardiovascular system: S1 & S2 heard, RRR. No JVD, murmurs, rubs, gallops or clicks. No pedal edema. ?Gastrointestinal system: Abdomen is nondistended, soft and nontender. No organomegaly or masses felt. Normal bowel sounds heard. ?Central nervous system: Alert and oriented. No focal neurological deficits. ?Extremities: Symmetric 5 x 5 power. ?Skin: No rashes, lesions or ulcers ?Psychiatry: Judgement and insight appear normal. Mood & affect appropriate.  ? ?Objective: ?Vitals:  ? 02/27/22 2133 02/27/22 2320 02/28/22 0413 02/28/22 0419  ?BP: (!) 114/57 112/60 (!) 112/57   ?Pulse: (!) 110 98 92   ?Resp: '18 20 18   '$ ?Temp: 100.2 ?  F (37.9 ?C) 98.6 ?F (37 ?C) 98.5 ?F (36.9 ?C)   ?TempSrc: Oral Oral Oral   ?SpO2: 95% 93% 96%   ?Weight:    75.6 kg  ?Height:       ? ? ?Intake/Output Summary (Last 24 hours) at 02/28/2022 0836 ?Last data filed at 02/28/2022 0601 ?Gross per 24 hour  ?Intake 0.32 ml  ?Output 400 ml  ?Net -399.68 ml  ? ?Filed Weights  ? 02/27/22 1508 02/28/22 0419  ?Weight: 77.1 kg 75.6 kg  ? ? ? ?Data Reviewed:  ? ?CBC: ?Recent Labs  ?Lab 02/27/22 ?1527 02/28/22 ?0600  ?WBC 8.6 8.8  ?NEUTROABS 7.0  --   ?HGB 16.1* 13.0  ?HCT 48.6* 38.3  ?MCV 94.4 92.7  ?PLT 246 191  ? ?Basic Metabolic Panel: ?Recent Labs  ?Lab 02/27/22 ?1527 02/28/22 ?0600  ?NA 141 140  ?K 4.1 3.3*  ?CL 108 111  ?CO2 21* 21*  ?GLUCOSE 159* 112*  ?BUN 21 20  ?CREATININE 1.14* 0.89  ?CALCIUM 10.1 8.6*  ? ?GFR: ?Estimated Creatinine Clearance: 48.3 mL/min (by C-G formula based on SCr of 0.89 mg/dL). ?Liver Function Tests: ?Recent Labs  ?Lab 02/27/22 ?1527 02/28/22 ?0600  ?AST 18 19  ?ALT 21 17  ?ALKPHOS 54 39  ?BILITOT 0.7 0.5  ?PROT 7.4 5.5*  ?ALBUMIN 4.3 3.0*  ? ?Recent Labs  ?Lab 02/27/22 ?1527  ?LIPASE 38  ? ?No results for input(s): AMMONIA in the last 168 hours. ?Coagulation Profile: ?No results for input(s): INR, PROTIME in the last 168 hours. ?Cardiac Enzymes: ?No results for input(s): CKTOTAL, CKMB, CKMBINDEX, TROPONINI in the last 168 hours. ?BNP (last 3 results) ?No results for input(s): PROBNP in the last 8760 hours. ?HbA1C: ?No results for input(s): HGBA1C in the last 72 hours. ?CBG: ?No results for input(s): GLUCAP in the last 168 hours. ?Lipid Profile: ?No results for input(s): CHOL, HDL, LDLCALC, TRIG, CHOLHDL, LDLDIRECT in the last 72 hours. ?Thyroid Function Tests: ?Recent Labs  ?  02/27/22 ?1713  ?TSH 3.008  ?FREET4 0.93  ? ?Anemia Panel: ?No results for input(s): VITAMINB12, FOLATE, FERRITIN, TIBC, IRON, RETICCTPCT in the last 72 hours. ?Sepsis Labs: ?Recent Labs  ?Lab 02/27/22 ?1713 02/27/22 ?2051  ?LATICACIDVEN 3.1* 2.0*  ? ? ?Recent Results (from the past 240 hour(s))  ?Resp Panel by RT-PCR (Flu A&B, Covid) Nasopharyngeal Swab     Status: None  ? Collection Time: 02/27/22  5:13  PM  ? Specimen: Nasopharyngeal Swab; Nasopharyngeal(NP) swabs in vial transport medium  ?Result Value Ref Range Status  ? SARS Coronavirus 2 by RT PCR NEGATIVE NEGATIVE Final  ?  Comment: (NOTE) ?SARS-CoV-2 target nucleic acids are NOT DETECTED. ? ?The SARS-CoV-2 RNA is generally detectable in upper respiratory ?specimens during the acute phase of infection. The lowest ?concentration of SARS-CoV-2 viral copies this assay can detect is ?138 copies/mL. A negative result does not preclude SARS-Cov-2 ?infection and should not be used as the sole basis for treatment or ?other patient management decisions. A negative result may occur with  ?improper specimen collection/handling, submission of specimen other ?than nasopharyngeal swab, presence of viral mutation(s) within the ?areas targeted by this assay, and inadequate number of viral ?copies(<138 copies/mL). A negative result must be combined with ?clinical observations, patient history, and epidemiological ?information. The expected result is Negative. ? ?Fact Sheet for Patients:  ?EntrepreneurPulse.com.au ? ?Fact Sheet for Healthcare Providers:  ?IncredibleEmployment.be ? ?This test is no t yet approved or cleared by the [place] and  ?has been authorized for  detection and/or diagnosis of SARS-CoV-2 by ?FDA under an Emergency Use Authorization (EUA). This EUA will remain  ?in effect (meaning this test can be used) for the duration of the ?COVID-19 declaration under Section 564(b)(1) of the Act, 21 ?U.S.C.section 360bbb-3(b)(1), unless the authorization is terminated  ?or revoked sooner.  ? ? ?  ? Influenza A by PCR NEGATIVE NEGATIVE Final  ? Influenza B by PCR NEGATIVE NEGATIVE Final  ?  Comment: (NOTE) ?The Xpert Xpress SARS-CoV-2/FLU/RSV plus assay is intended as an aid ?in the diagnosis of influenza from Nasopharyngeal swab specimens and ?should not be used as a sole basis for treatment. Nasal washings and ?aspirates are  unacceptable for Xpert Xpress SARS-CoV-2/FLU/RSV ?testing. ? ?Fact Sheet for Patients: ?EntrepreneurPulse.com.au ? ?Fact Sheet for Healthcare Providers: ?HurricaneAlarm.si

## 2022-02-28 NOTE — Care Management Obs Status (Signed)
MEDICARE OBSERVATION STATUS NOTIFICATION ? ? ?Patient Details  ?Name: [name] ?MRN: [id number] ?Date of Birth: Jan 02, 1941 ? ? ?Medicare Observation Status Notification Given:  Yes ? ? ? ?Tom-Johnson, Renea Ee, RN ?02/28/2022, 3:13 PM ?

## 2022-02-28 NOTE — Plan of Care (Signed)
  Problem: Elimination: Goal: Will not experience complications related to bowel motility Outcome: Progressing   Problem: Elimination: Goal: Will not experience complications related to urinary retention Outcome: Progressing   

## 2022-03-01 DIAGNOSIS — N179 Acute kidney failure, unspecified: Secondary | ICD-10-CM | POA: Diagnosis not present

## 2022-03-01 LAB — CBC
HCT: 39.1 % (ref 36.0–46.0)
Hemoglobin: 12.6 g/dL (ref 12.0–15.0)
MCH: 31 pg (ref 26.0–34.0)
MCHC: 32.2 g/dL (ref 30.0–36.0)
MCV: 96.3 fL (ref 80.0–100.0)
Platelets: 185 10*3/uL (ref 150–400)
RBC: 4.06 MIL/uL (ref 3.87–5.11)
RDW: 14.6 % (ref 11.5–15.5)
WBC: 6.4 10*3/uL (ref 4.0–10.5)
nRBC: 0 % (ref 0.0–0.2)

## 2022-03-01 LAB — BASIC METABOLIC PANEL
Anion gap: 8 (ref 5–15)
BUN: 9 mg/dL (ref 8–23)
CO2: 20 mmol/L — ABNORMAL LOW (ref 22–32)
Calcium: 9.1 mg/dL (ref 8.9–10.3)
Chloride: 112 mmol/L — ABNORMAL HIGH (ref 98–111)
Creatinine, Ser: 0.71 mg/dL (ref 0.44–1.00)
GFR, Estimated: >60 mL/min (ref >60)
Glucose, Bld: 87 mg/dL (ref 70–99)
Potassium: 3.5 mmol/L (ref 3.5–5.1)
Sodium: 140 mmol/L (ref 135–145)

## 2022-03-01 LAB — MAGNESIUM: Magnesium: 1.6 mg/dL — ABNORMAL LOW (ref 1.7–2.4)

## 2022-03-01 MED ORDER — LOPERAMIDE HCL 2 MG PO CAPS: 2.0000 mg | ORAL_CAPSULE | ORAL | 0 refills | Status: DC | PRN

## 2022-03-01 MED ORDER — LOPERAMIDE HCL 2 MG PO CAPS: 2.0000 mg | ORAL_CAPSULE | ORAL | Status: DC | PRN

## 2022-03-01 MED ORDER — POTASSIUM CHLORIDE 10 MEQ/100ML IV SOLN
10.0000 meq | INTRAVENOUS | Status: AC
  Administered 2022-03-01 (×3): 10 meq via INTRAVENOUS
  Filled 2022-03-01 (×3): qty 100

## 2022-03-01 MED ORDER — ALBUTEROL SULFATE HFA 108 (90 BASE) MCG/ACT IN AERS: 2.0000 | INHALATION_SPRAY | Freq: Four times a day (QID) | RESPIRATORY_TRACT | 2 refills | Status: DC | PRN

## 2022-03-01 MED ORDER — MAGNESIUM SULFATE 2 GM/50ML IV SOLN
2.0000 g | Freq: Once | INTRAVENOUS | Status: AC
  Administered 2022-03-01: 2 g via INTRAVENOUS
  Filled 2022-03-01: qty 50

## 2022-03-01 NOTE — Discharge Summary (Signed)
Physician Discharge Summary  ?[name] WNU:[id number] DOB: January 05, 1941 DOA: 02/27/2022 ? ?PCP: [name], MD ? ?Admit date: 02/27/2022 ?Discharge date: 03/01/2022 ? ?Admitted From: Home  ?Disposition: Home ? ?Recommendations for Outpatient Follow-up:  ?Follow up with PCP in 1-2 weeks ?Please obtain BMP/CBC in one week your next doctors visit.  ?Advised aggressive oral hydration at home with Pedialyte or Gatorade ?Loperamide prescribed.  Have advised her to take it if she has greater than 6 bowel movements over 24 hours to slow down the transit rate. ?Albuterol inhaler prescribed at her request. ?Recommend follow-up imaging with MRCP in about 2 yearsFor small pancreatic head lesion. ?Repeat chest x-ray with PCP in about 6-8 weeks ? ?Discharge Condition: Stable ?CODE STATUS: Full code ?Diet recommendation: Oral as tolerated ? ?Brief/Interim Summary: ?81 year old with past medical history of HLD, hypothyroidism, dementia, HTN, IBS, schizoaffective disorder, anxiety, diastolic CHF, GERD, breast cancer, adrenal insufficiency admitted to the hospital for nausea, vomiting, diarrhea and acute kidney injury.  Her symptoms started 4 days prior to the admission.  Upon admission she was noted to be clinically dehydrated, with lactic acidosis and tachycardia.  CT abdomen pelvis did not show any acute abnormality but showed bibasilar infiltrates in the lungs and small pancreatic head lesion. ?During the hospitalization patient's stool study was positive for norovirus.  Antibiotics were discontinued.  Renal function improved with IV fluids.  Her symptoms significantly improved as well and she was tolerating oral without any issues.  Today she is medically stable for discharge with recommendations as stated above. ? ? ?Assessment and Plan: ?No notes have been filed under this hospital service. ?Service: Hospitalist ? ? ? ?  ?Body mass index is 29.52 kg/m?. ? ?  ? ? ? ?Discharge Diagnoses:  ?Principal Problem: ?  AKI (acute  kidney injury) ([place]) ?Active Problems: ?  Hypothyroidism ?  Dementia ([place]) ?  HTN (hypertension) ?  Irritable bowel syndrome with constipation and diarrhea ?  HLD (hyperlipidemia) ?  Anxiety ?  Schizo-affective schizophrenia ([place]) ?  Chronic diastolic CHF (congestive heart failure) ([place]) ?  GERD (gastroesophageal reflux disease) ?  Bipolar 1 disorder ([place]) ?  Nausea vomiting and diarrhea ?  Malignant neoplasm of overlapping sites of right breast in female, estrogen receptor positive ([place]) ?  Adrenal insufficiency ([place]) ?  Generalized weakness ? ? ?AKI ?- Resolved with IV fluids.  Admission creatinine 1.41 now at baseline of 0.7. ? ?Nausea vomiting and diarrhea secondary to norovirus gastroenteritis ?- C. difficile is negative.  GI panel shows norovirus.  Advised supportive care and oral hydration at home.  Loperamide prescribed in case if she has greater than 6-7 episodes of watery diarrhea over 24 hours. ?-CT abdomen pelvis negative for any acute pathology.  Showed small pancreatic head lesion. ?-Lipase and LFTs within normal limits. ?  ?Bibasilar infiltrates ?- No obvious evidence of infection.  Albuterol inhaler has been prescribed for her to take at home.  Follow-up with PCP and repeat chest x-ray in about 6-8 weeks ?  ?Abnormal EKG ?Troponins are trending downward.  Likely demand ischemia.  EKG does not appear to be having any acute changes.  Does have prolonged QTc.  We will repeat ?  ?Hyperlipidemia ?-Continue rosuvastatin ? ?Hypothyroidism ?-Fenofibrate ? ?Hypertension ?- Resume home regimen ? ?Anxiety ?Schizoaffective schizophrenia ?Bipolar ?-Continue home meds ?  ?Dementia ?-Namenda ?  ?Diastolic CHF, compensated ?> Echo in 2021 with EF 70-75% and normal RV function. ? ?GERD ?- Continue home PPI ? ?Adrenal sufficiency ?-Home hydrocortisone ?  ?  Abnormal CT, pancreatic head lesion ?-Recommend follow-up imaging with MRCP in about 2 years ? ?Breast cancer history ?- Noted   ? ? ?Consultations: ?None ? ?Subjective: ?Feels great wants to go home. ? ?Discharge Exam: ?Vitals:  ? 03/01/22 0514 03/01/22 0954  ?BP: (!) 145/54 (!) 156/59  ?Pulse: 76 82  ?Resp: 18 18  ?Temp: 98.8 ?F (37.1 ?C) 98.2 ?F (36.8 ?C)  ?SpO2: 96% 96%  ? ?Vitals:  ? 02/28/22 1709 02/28/22 2024 03/01/22 2641 03/01/22 0954  ?BP: 118/62 (!) 145/60 (!) 145/54 (!) 156/59  ?Pulse: 74 72 76 82  ?Resp: '18 18 18 18  '$ ?Temp: 98 ?F (36.7 ?C) 99 ?F (37.2 ?C) 98.8 ?F (37.1 ?C) 98.2 ?F (36.8 ?C)  ?TempSrc: Oral Oral Oral   ?SpO2: 98% 97% 96% 96%  ?Weight:      ?Height:      ? ? ?General: Pt is alert, awake, not in acute distress ?Cardiovascular: RRR, S1/S2 +, no rubs, no gallops ?Respiratory: CTA bilaterally, no wheezing, no rhonchi ?Abdominal: Soft, NT, ND, bowel sounds + ?Extremities: no edema, no cyanosis ? ?Discharge Instructions ? ? ?Allergies as of 03/01/2022   ? ?   Reactions  ? Lithium Nausea Only  ? Fluoxetine Other (See Comments)  ? Pt felt crazy  ? Macrodantin Other (See Comments)  ? Unknown reaction  ? Mirabegron Other (See Comments)  ? ineffective  ? Nitrofurantoin Other (See Comments)  ? Other reaction(s): Did not agree with me  ? Paxil [paroxetine] Other (See Comments)  ? Made pt feel crazy  ? Prednisone Other (See Comments)  ? Makes her feel very jittery  ? Prozac [fluoxetine Hcl] Other (See Comments)  ? Pt felt crazy  ? Sertraline Hcl Other (See Comments)  ? Unknown reaction  ? Solifenacin Other (See Comments)  ? Ineffective   ? Sulfa Antibiotics Other (See Comments)  ? Unknown reaction  ? Wellbutrin [bupropion Hcl] Other (See Comments)  ? Unknown reaction  ? ?  ? ?  ?Medication List  ?  ? ?TAKE these medications   ? ?albuterol 108 (90 Base) MCG/ACT inhaler ?Commonly known as: VENTOLIN HFA ?Inhale 2 puffs into the lungs every 6 (six) hours as needed for wheezing or shortness of breath. ?  ?ALPRAZolam 1 MG tablet ?Commonly known as: Duanne Moron ?Take 1 mg by mouth at bedtime. ?  ?ALPRAZolam 0.5 MG tablet ?Commonly known as:  Duanne Moron ?Take 0.5 mg by mouth 3 (three) times daily as needed for anxiety. Also takes '1mg'$  tabled scheduled ?  ?amLODipine 2.5 MG tablet ?Commonly known as: NORVASC ?Take 2.5 mg by mouth at bedtime. ?  ?BIOTIN PO ?Take 1 tablet by mouth every morning. ?  ?CALCIUM CARBONATE PO ?Take 1 tablet by mouth every morning. ?  ?donepezil 10 MG tablet ?Commonly known as: ARICEPT ?Take 10 mg by mouth at bedtime. ?  ?fenofibrate 145 MG tablet ?Commonly known as: TRICOR ?Take 145 mg by mouth at bedtime. ?  ?FISH OIL PO ?Take 1 capsule by mouth every morning. ?  ?gabapentin 100 MG capsule ?Commonly known as: NEURONTIN ?TAKE 2 CAPSULES BY MOUTH AT BEDTIME ?What changed: how to take this ?  ?hydrocortisone 5 MG tablet ?Commonly known as: CORTEF ?TAKE 2 TABLETS WITH BREAKFAST AND 1 TABLET EVERY AFTERNOON BETWEEN 2-4 PM ?What changed: See the new instructions. ?  ?lamoTRIgine 150 MG tablet ?Commonly known as: LAMICTAL ?Take 150 mg by mouth at bedtime. ?  ?levothyroxine 125 MCG tablet ?Commonly known as: SYNTHROID ?Take 1 tablet (125 mcg  total) by mouth as directed. 1.5 tablet on Sundays, 1 tablet Monday through Saturday ?What changed:  ?when to take this ?additional instructions ?  ?loperamide 2 MG capsule ?Commonly known as: IMODIUM ?Take 1 capsule (2 mg total) by mouth as needed for diarrhea or loose stools (If >6 episodes of diarrhea over 24 hrs.    Maximum daily dose = 16 mg). ?  ?memantine 10 MG tablet ?Commonly known as: NAMENDA ?Take 10 mg by mouth at bedtime. ?  ?metoprolol tartrate 25 MG tablet ?Commonly known as: LOPRESSOR ?Take 25 mg by mouth every morning. ?  ?multivitamin with minerals Tabs tablet ?Take 1 tablet by mouth every morning. ?  ?OLANZapine 15 MG tablet ?Commonly known as: ZYPREXA ?Take 15 mg by mouth at bedtime. ?  ?omeprazole 40 MG capsule ?Commonly known as: PRILOSEC ?Take 40 mg by mouth in the morning. ?  ?polyvinyl alcohol 1.4 % ophthalmic solution ?Commonly known as: LIQUIFILM TEARS ?Place 1 drop into both  eyes every morning. ?  ?PreserVision AREDS 2 Caps ?Take 1 capsule by mouth every morning. ?  ?QUEtiapine 25 MG tablet ?Commonly known as: SEROQUEL ?Take 50 mg by mouth at bedtime as needed (sleep). ?  ?ramelteon 8 MG tablet ?Commonly known as: R

## 2022-03-01 NOTE — [place] FL2 (Signed)
?[place] MEDICAID FL2 LEVEL OF CARE SCREENING TOOL  ?  ? ?IDENTIFICATION  ?Patient Name: ?[name] Birthdate: Mar 06, 1941 Sex: female Admission Date (Current Location): ?02/27/2022  ?[place] and [place] Number: ? [place] ?  Facility and Address:  ?[facility] [place], [place] [address] [place], [place] [address] ?     Provider Number: ?[id number]  ?Attending Physician Name and Address:  ?Damita Lack, MD ? Relative Name and Phone Number:  ?Alm Bustard [phone] ?   ?Current Level of Care: ?Hospital Recommended Level of Care: ?Assisted Living Facility Prior Approval Number: ?  ? ?Date Approved/Denied: ?  PASRR Number: ?  ? ?Discharge Plan: ?Other (Comment) (ALF) ?  ? ?Current Diagnoses: ?Patient Active Problem List  ? Diagnosis Date Noted  ? Generalized weakness 02/28/2022  ? AKI (acute kidney injury) ([place]) 02/27/2022  ? Adrenal insufficiency ([place]) 07/25/2021  ? Malignant neoplasm of overlapping sites of right breast in female, estrogen receptor positive ([place]) 01/01/2021  ? 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 Community acquired pneumonia of both lower lobes 08/18/2020  ? NSTEMI (non-ST elevated myocardial infarction) ([place]) 08/17/2020  ? Fever   ? Troponin level elevated   ? Sepsis ([place]) 02/13/2019  ? HLD (hyperlipidemia) 02/13/2019  ? Anxiety 02/13/2019  ? Chronic diastolic CHF (congestive heart failure) ([place]) 02/13/2019  ? GERD (gastroesophageal reflux disease) 02/13/2019  ? Nausea vomiting and diarrhea 02/13/2019  ? Schizo-affective schizophrenia ([place])   ? Bipolar 1 disorder ([place])   ? Pleural effusion on right 06/28/2018  ? Osteoarthritis of left knee 11/09/2014  ? Nocturia more than twice per night 11/09/2014  ? Right bundle branch block 02/23/2013  ? Irritable bowel syndrome with constipation and diarrhea 10/28/2012  ? Dermatitis 12/24/2011  ? Hypercholesterolemia 04/30/2011  ? Hypothyroidism 04/30/2011  ? Dementia ([place]) 04/30/2011  ? HTN (hypertension)  04/30/2011  ? ? ?Orientation RESPIRATION BLADDER Height & Weight   ?  ?Self, Situation ? Normal External catheter Weight: 166 lb 10.7 oz (75.6 kg) ?Height:  '5\' 3"'$  (160 cm)  ?BEHAVIORAL SYMPTOMS/MOOD NEUROLOGICAL BOWEL NUTRITION STATUS  ?    Continent Diet (see discharge summary)  ?AMBULATORY STATUS COMMUNICATION OF NEEDS Skin   ?Limited Assist Verbally Normal ?  ?  ?  ?    ?     ?     ? ? ?Personal Care Assistance Level of Assistance  ?Bathing, Dressing Bathing Assistance: Limited assistance ?  ?Dressing Assistance: Limited assistance ?   ? ?Functional Limitations Info  ?    ?  ?   ? ? ?SPECIAL CARE FACTORS FREQUENCY  ?PT (By licensed PT), OT (By licensed OT)   ?  ?PT Frequency: per facility ?OT Frequency: per faciilty ?  ?  ?  ?   ? ? ?Contractures    ? ? ?Additional Factors Info  ?Code Status, Allergies Code Status Info: FULL ?Allergies Info: Lithium,  Fluoxetine ,Macrodantin, Mirabegron,Nitrofurantoin,Paxil , Prednisone ,Prozac, Sertraline, Solifenacin , Sulfa Antibiotics ,Wellbutrin (bupropion Hcl) ?  ?  ?  ?   ? ?Current Medications (03/01/2022):  This is the current hospital active medication list ?Current Facility-Administered Medications  ?Medication Dose Route Frequency Provider Last Rate Last Admin  ? acetaminophen (TYLENOL) tablet 650 mg  650 mg Oral Q6H PRN Marcelyn Bruins, MD   650 mg at 02/28/22 0736  ? Or  ? acetaminophen (TYLENOL) suppository 650 mg  650 mg Rectal Q6H PRN Marcelyn Bruins, MD      ?  ALPRAZolam Duanne Moron) tablet 1 mg  1 mg Oral QHS Marcelyn Bruins, MD   1 mg at 02/28/22 2115  ? amLODipine (NORVASC) tablet 2.5 mg  2.5 mg Oral Daily Marcelyn Bruins, MD   2.5 mg at 03/01/22 0944  ? diazepam (VALIUM) tablet 5 mg  5 mg Oral Q12H PRN Damita Lack, MD   5 mg at 03/01/22 0604  ? enoxaparin (LOVENOX) injection 40 mg  40 mg Subcutaneous Q24H Marcelyn Bruins, MD   40 mg at 02/28/22 2038  ? fenofibrate tablet 160 mg  160 mg Oral Daily Marcelyn Bruins, MD   160 mg at  03/01/22 0944  ? gabapentin (NEURONTIN) capsule 200 mg  200 mg Oral QHS Marcelyn Bruins, MD   200 mg at 02/28/22 2115  ? guaiFENesin (ROBITUSSIN) 100 MG/5ML liquid 5 mL  5 mL Oral Q4H PRN Amin, Ankit Chirag, MD      ? hydrALAZINE (APRESOLINE) injection 10 mg  10 mg Intravenous Q4H PRN Amin, Ankit Chirag, MD      ? hydrocortisone (CORTEF) tablet 10 mg  10 mg Oral Daily Hammons, Kimberly B, RPH   10 mg at 03/01/22 6283  ? And  ? hydrocortisone (CORTEF) tablet 5 mg  5 mg Oral Q24H Hammons, Kimberly B, RPH   5 mg at 02/28/22 1429  ? ipratropium-albuterol (DUONEB) 0.5-2.5 (3) MG/3ML nebulizer solution 3 mL  3 mL Nebulization Q4H PRN Amin, Ankit Chirag, MD      ? lamoTRIgine (LAMICTAL) tablet 150 mg  150 mg Oral QPM Marcelyn Bruins, MD   150 mg at 02/28/22 1730  ? levothyroxine (SYNTHROID) tablet 125 mcg  125 mcg Oral Once per day on Mon Tue Wed Thu Fri Sat Marcelyn Bruins, MD   125 mcg at 03/01/22 6629  ? [START ON 03/02/2022] levothyroxine (SYNTHROID) tablet 187.5 mcg  187.5 mcg Oral Once per day on Sun Theotis Burrow, [facility]      ? loperamide (IMODIUM) capsule 2 mg  2 mg Oral PRN Amin, Ankit Chirag, MD      ? memantine (NAMENDA) tablet 10 mg  10 mg Oral BID Marcelyn Bruins, MD   10 mg at 03/01/22 0944  ? metoprolol tartrate (LOPRESSOR) injection 5 mg  5 mg Intravenous Q4H PRN Amin, Ankit Chirag, MD      ? metoprolol tartrate (LOPRESSOR) tablet 25 mg  25 mg Oral BID Marcelyn Bruins, MD   25 mg at 03/01/22 0944  ? OLANZapine (ZYPREXA) tablet 30 mg  30 mg Oral QHS Marcelyn Bruins, MD   30 mg at 02/28/22 2123  ? oxyCODONE (Oxy IR/ROXICODONE) immediate release tablet 5 mg  5 mg Oral Q4H PRN Amin, Jeanella Flattery, MD   5 mg at 02/28/22 1731  ? pantoprazole (PROTONIX) EC tablet 40 mg  40 mg Oral Daily Marcelyn Bruins, MD   40 mg at 03/01/22 0944  ? QUEtiapine (SEROQUEL) tablet 50 mg  50 mg Oral QHS Marcelyn Bruins, MD   50 mg at 02/28/22 2115  ? ramelteon (ROZEREM) tablet 8 mg  8 mg Oral QHS PRN  Marcelyn Bruins, MD   8 mg at 02/28/22 0001  ? senna-docusate (Senokot-S) tablet 1 tablet  1 tablet Oral QHS PRN Amin, Ankit Chirag, MD      ? sodium chloride flush (NS) 0.9 % injection 3 mL  3 mL Intravenous Q12H Marcelyn Bruins, MD   3 mL at 03/01/22 0945  ? ? ? ?  Discharge Medications: ?Please see discharge summary for a list of discharge medications. ? ?Relevant Imaging Results: ? ?Relevant Lab Results: ? ? ?Additional Information ?  ? ?[place], LCSWA ? ? ? ? ?

## 2022-03-01 NOTE — TOC Transition Note (Signed)
Transition of Care (TOC) - CM/SW Discharge Note ? ? ?Patient Details  ?Name: [name] ?MRN: [id number] ?Date of Birth: 11/18/1941 ? ?Transition of Care (TOC) CM/SW Contact:  ?Bartholomew Crews, RN ?Phone Number: [phone] ?03/01/2022, 9:43 AM ? ? ?Clinical Narrative:    ? ?[place] orders for PT/OT faxed to [facility] at [place]. Will fax DC summary when available.  ? ?Final next level of care: [place] ?Barriers to Discharge: No Barriers Identified ? ? ?Patient Goals and CMS Choice ?Patient states their goals for this hospitalization and ongoing recovery are:: To return to ALF ?CMS Medicare.gov Compare Post Acute Care list provided to:: Patient ?Choice offered to / list presented to : Patient ? ?Discharge Placement ?  ?           ?  ?  ?  ?  ? ?Discharge Plan and Services ?  ?Discharge Planning Services: CM Consult ?Post Acute Care Choice: Home Health          ?  ?  ?  ?  ?  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  ? ?Social Determinants of Health (SDOH) Interventions ?  ? ? ?Readmission Risk Interventions ?   ? View : No data to display.  ?  ?  ?  ? ? ? ? ? ?

## 2022-03-01 NOTE — Progress Notes (Signed)
No nurses on duty on weekend as their assisted living.Report given to assistant nurse -Colvin Caroli. ?

## 2022-03-02 ENCOUNTER — Emergency Department (HOSPITAL_COMMUNITY): Payer: Medicare HMO

## 2022-03-02 ENCOUNTER — Other Ambulatory Visit: Payer: Self-pay

## 2022-03-02 ENCOUNTER — Emergency Department (HOSPITAL_COMMUNITY)
Admission: EM | Admit: 2022-03-02 | Discharge: 2022-03-03 | Disposition: A | Discharge status: Home / Self Care | Payer: Medicare HMO | Attending: Emergency Medicine | Admitting: Emergency Medicine

## 2022-03-02 DIAGNOSIS — Z20822 Contact with and (suspected) exposure to covid-19: Secondary | ICD-10-CM | POA: Diagnosis not present

## 2022-03-02 DIAGNOSIS — Y9389 Activity, other specified: Secondary | ICD-10-CM | POA: Insufficient documentation

## 2022-03-02 DIAGNOSIS — R600 Localized edema: Secondary | ICD-10-CM | POA: Diagnosis not present

## 2022-03-02 DIAGNOSIS — I509 Heart failure, unspecified: Secondary | ICD-10-CM | POA: Diagnosis not present

## 2022-03-02 DIAGNOSIS — F250 Schizoaffective disorder, bipolar type: Secondary | ICD-10-CM | POA: Diagnosis not present

## 2022-03-02 DIAGNOSIS — E039 Hypothyroidism, unspecified: Secondary | ICD-10-CM | POA: Insufficient documentation

## 2022-03-02 DIAGNOSIS — R531 Weakness: Secondary | ICD-10-CM | POA: Diagnosis not present

## 2022-03-02 DIAGNOSIS — F0390 Unspecified dementia without behavioral disturbance: Secondary | ICD-10-CM | POA: Insufficient documentation

## 2022-03-02 DIAGNOSIS — Z87891 Personal history of nicotine dependence: Secondary | ICD-10-CM | POA: Insufficient documentation

## 2022-03-02 DIAGNOSIS — R509 Fever, unspecified: Secondary | ICD-10-CM | POA: Diagnosis not present

## 2022-03-02 DIAGNOSIS — M25562 Pain in left knee: Secondary | ICD-10-CM | POA: Diagnosis not present

## 2022-03-02 DIAGNOSIS — R4182 Altered mental status, unspecified: Secondary | ICD-10-CM | POA: Diagnosis not present

## 2022-03-02 DIAGNOSIS — Z8701 Personal history of pneumonia (recurrent): Secondary | ICD-10-CM | POA: Insufficient documentation

## 2022-03-02 DIAGNOSIS — R0602 Shortness of breath: Secondary | ICD-10-CM | POA: Diagnosis not present

## 2022-03-02 DIAGNOSIS — Z859 Personal history of malignant neoplasm, unspecified: Secondary | ICD-10-CM | POA: Diagnosis not present

## 2022-03-02 DIAGNOSIS — I110 Hypertensive heart disease with heart failure: Secondary | ICD-10-CM | POA: Insufficient documentation

## 2022-03-02 DIAGNOSIS — J189 Pneumonia, unspecified organism: Secondary | ICD-10-CM | POA: Diagnosis not present

## 2022-03-02 DIAGNOSIS — R112 Nausea with vomiting, unspecified: Secondary | ICD-10-CM | POA: Diagnosis not present

## 2022-03-02 DIAGNOSIS — Y999 Unspecified external cause status: Secondary | ICD-10-CM | POA: Insufficient documentation

## 2022-03-02 DIAGNOSIS — Z79899 Other long term (current) drug therapy: Secondary | ICD-10-CM | POA: Insufficient documentation

## 2022-03-02 DIAGNOSIS — W1839XA Other fall on same level, initial encounter: Secondary | ICD-10-CM | POA: Insufficient documentation

## 2022-03-02 DIAGNOSIS — Y92199 Unspecified place in other specified residential institution as the place of occurrence of the external cause: Secondary | ICD-10-CM | POA: Diagnosis not present

## 2022-03-02 DIAGNOSIS — I10 Essential (primary) hypertension: Secondary | ICD-10-CM | POA: Diagnosis not present

## 2022-03-02 DIAGNOSIS — F319 Bipolar disorder, unspecified: Secondary | ICD-10-CM | POA: Diagnosis not present

## 2022-03-02 DIAGNOSIS — F309 Manic episode, unspecified: Secondary | ICD-10-CM | POA: Diagnosis not present

## 2022-03-02 DIAGNOSIS — I959 Hypotension, unspecified: Secondary | ICD-10-CM | POA: Diagnosis not present

## 2022-03-02 LAB — CBC WITH DIFFERENTIAL/PLATELET
Abs Immature Granulocytes: 0.02 10*3/uL (ref 0.00–0.07)
Basophils Absolute: 0 10*3/uL (ref 0.0–0.1)
Basophils Relative: 0 %
Eosinophils Absolute: 0.1 10*3/uL (ref 0.0–0.5)
Eosinophils Relative: 2 %
HCT: 37.5 % (ref 36.0–46.0)
Hemoglobin: 12.8 g/dL (ref 12.0–15.0)
Immature Granulocytes: 0 %
Lymphocytes Relative: 40 %
Lymphs Abs: 2.1 10*3/uL (ref 0.7–4.0)
MCH: 31.4 pg (ref 26.0–34.0)
MCHC: 34.1 g/dL (ref 30.0–36.0)
MCV: 91.9 fL (ref 80.0–100.0)
Monocytes Absolute: 0.5 10*3/uL (ref 0.1–1.0)
Monocytes Relative: 10 %
Neutro Abs: 2.6 10*3/uL (ref 1.7–7.7)
Neutrophils Relative %: 48 %
Platelets: 233 10*3/uL (ref 150–400)
RBC: 4.08 MIL/uL (ref 3.87–5.11)
RDW: 13.7 % (ref 11.5–15.5)
WBC: 5.4 10*3/uL (ref 4.0–10.5)
nRBC: 0 % (ref 0.0–0.2)

## 2022-03-02 LAB — COMPREHENSIVE METABOLIC PANEL
ALT: 20 U/L (ref 0–44)
AST: 19 U/L (ref 15–41)
Albumin: 3.4 g/dL — ABNORMAL LOW (ref 3.5–5.0)
Alkaline Phosphatase: 39 U/L (ref 38–126)
Anion gap: 9 (ref 5–15)
BUN: 5 mg/dL — ABNORMAL LOW (ref 8–23)
CO2: 27 mmol/L (ref 22–32)
Calcium: 9.5 mg/dL (ref 8.9–10.3)
Chloride: 104 mmol/L (ref 98–111)
Creatinine, Ser: 0.72 mg/dL (ref 0.44–1.00)
GFR, Estimated: >60 mL/min (ref >60)
Glucose, Bld: 99 mg/dL (ref 70–99)
Potassium: 3.6 mmol/L (ref 3.5–5.1)
Sodium: 140 mmol/L (ref 135–145)
Total Bilirubin: 0.4 mg/dL (ref 0.3–1.2)
Total Protein: 6.2 g/dL — ABNORMAL LOW (ref 6.5–8.1)

## 2022-03-02 LAB — URINALYSIS, ROUTINE W REFLEX MICROSCOPIC
Bacteria, UA: NONE SEEN
Bilirubin Urine: NEGATIVE
Glucose, UA: NEGATIVE mg/dL
Hgb urine dipstick: NEGATIVE
Ketones, ur: NEGATIVE mg/dL
Nitrite: NEGATIVE
Protein, ur: NEGATIVE mg/dL
Specific Gravity, Urine: 1.006 (ref 1.005–1.030)
pH: 8 (ref 5.0–8.0)

## 2022-03-02 MED ORDER — ACETAMINOPHEN 325 MG PO TABS
650.0000 mg | ORAL_TABLET | Freq: Once | ORAL | Status: AC
  Administered 2022-03-02: 650 mg via ORAL
  Filled 2022-03-02: qty 2

## 2022-03-02 NOTE — ED Provider Notes (Signed)
?[facility] ?Provider Note ? ? ?CSN: [id number] ?Arrival date & time: 03/02/22  1849 ? ?  ? ?History ? ?Chief Complaint  ?Patient presents with  ? Weakness  ? ? ?[name] is a 81 y.o. female with past medical history of dementia, schizoaffective schizophrenia, CHF, bipolar 1, discharged yesterday after being admitted for AKI in the setting of norovirus who presents today for feeling overall weak. ?She states that last night she fell when she was walking without her walker noting "well I guess I really need to use it."    She denies any specific injuries.  ?She denies striking her head or passing out.  She states that she is having increased pain in her left knee since her narcotic pain medications were stopped.  She states that since her discharge she has not had any ongoing vomiting or diarrhea.  She reports she has been drinking Pedialyte without difficulty.  She denies any abdominal pain or chest pain. ?She reports she did urinate in the bed last night. ? ?She denies any pain in her neck, chest, back, abdomen or pelvis. ? ? ?She tells me "I think I need a nice hospital stay of about 2 nights so that I can get feeling better."  She is unable to tell me why she thinks this will benefit her. ? ?Patient is tangential with answers, a poor historian.  ? ?HPI ? ?  ? ?Home Medications ?Prior to Admission medications   ?Medication Sig Start Date End Date Taking? Authorizing Provider  ?albuterol (VENTOLIN HFA) 108 (90 Base) MCG/ACT inhaler Inhale 2 puffs into the lungs every 6 (six) hours as needed for wheezing or shortness of breath. 03/01/22   Amin, Jeanella Flattery, MD  ?ALPRAZolam Duanne Moron) 0.5 MG tablet Take 0.5 mg by mouth 3 (three) times daily as needed for anxiety. Also takes '1mg'$  tabled scheduled ?Patient not taking: Reported on 02/28/2022    [provider]  ?ALPRAZolam Duanne Moron) 1 MG tablet Take 1 mg by mouth at bedtime. 02/08/21   [provider]  ?amLODipine  (NORVASC) 2.5 MG tablet Take 2.5 mg by mouth at bedtime.    [provider]  ?Ascorbic Acid (VITAMIN C PO) Take 1 tablet by mouth every morning.    [provider]  ?BIOTIN PO Take 1 tablet by mouth every morning.    [provider]  ?Calcium Carbonate Antacid (CALCIUM CARBONATE PO) Take 1 tablet by mouth every morning.    [provider]  ?Cyanocobalamin (VITAMIN B-12 PO) Take 1 tablet by mouth every morning.    [provider]  ?donepezil (ARICEPT) 10 MG tablet Take 10 mg by mouth at bedtime.    [provider]  ?fenofibrate (TRICOR) 145 MG tablet Take 145 mg by mouth at bedtime.    [provider]  ?gabapentin (NEURONTIN) 100 MG capsule TAKE 2 CAPSULES BY MOUTH AT BEDTIME ?Patient taking differently: 200 mg at bedtime. 01/24/22   Pieter Partridge, DO  ?hydrocortisone (CORTEF) 5 MG tablet TAKE 2 TABLETS WITH BREAKFAST AND 1 TABLET EVERY AFTERNOON BETWEEN 2-4 PM ?Patient taking differently: Take 5-10 mg by mouth See admin instructions. Take 2 tablets (10 mg) by mouth daily after breakfast and 1 tablet (5 mg) after lunch 01/10/22   Shamleffer, Melanie Crazier, MD  ?lamoTRIgine (LAMICTAL) 150 MG tablet Take 150 mg by mouth at bedtime. 04/06/16   [provider]  ?levothyroxine (SYNTHROID) 125 MCG tablet Take 1 tablet (125 mcg total) by mouth as  directed. 1.5 tablet on Sundays, 1 tablet Monday through Saturday ?Patient taking differently: Take 125 mcg by mouth daily before breakfast. 10/11/21   Shamleffer, Melanie Crazier, MD  ?loperamide (IMODIUM) 2 MG capsule Take 1 capsule (2 mg total) by mouth as needed for diarrhea or loose stools (If >6 episodes of diarrhea over 24 hrs.    Maximum daily dose = 16 mg). 03/01/22   Amin, Jeanella Flattery, MD  ?memantine (NAMENDA) 10 MG tablet Take 10 mg by mouth at bedtime. 06/21/20   [provider]  ?metoprolol tartrate (LOPRESSOR) 25 MG tablet Take 25 mg by mouth every morning.    [provider]   ?Multiple Vitamin (MULTIVITAMIN WITH MINERALS) TABS tablet Take 1 tablet by mouth every morning.    [provider]  ?Multiple Vitamins-Minerals (PRESERVISION AREDS 2) CAPS Take 1 capsule by mouth every morning.    [provider]  ?OLANZapine (ZYPREXA) 15 MG tablet Take 15 mg by mouth at bedtime.    [provider]  ?Omega-3 Fatty Acids (FISH OIL PO) Take 1 capsule by mouth every morning.    [provider]  ?omeprazole (PRILOSEC) 40 MG capsule Take 40 mg by mouth in the morning.    [provider]  ?polyvinyl alcohol (LIQUIFILM TEARS) 1.4 % ophthalmic solution Place 1 drop into both eyes every morning.    [provider]  ?QUEtiapine (SEROQUEL) 25 MG tablet Take 50 mg by mouth at bedtime as needed (sleep).    [provider]  ?ramelteon (ROZEREM) 8 MG tablet Take 8 mg by mouth at bedtime.    [provider]  ?rosuvastatin (CRESTOR) 20 MG tablet Take 1 tablet (20 mg total) by mouth daily. Need labs from PCP ?Patient taking differently: Take 20 mg by mouth at bedtime. Need labs from PCP 12/17/21   Skeet Latch, MD  ?spironolactone (ALDACTONE) 25 MG tablet Take 1 tablet (25 mg total) by mouth daily. ?Patient taking differently: Take 25 mg by mouth every morning. 06/28/21   Skeet Latch, MD  ?   ? ?Allergies    ?Lithium, Fluoxetine, Macrodantin, Mirabegron, Nitrofurantoin, Paxil [paroxetine], Prednisone, Prozac [fluoxetine hcl], Sertraline hcl, Solifenacin, Sulfa antibiotics, and Wellbutrin [bupropion hcl]   ? ?Review of Systems   ?Review of Systems ? ?Physical Exam ?Updated Vital Signs ?BP (!) 166/133   Pulse 71   Temp 99.1 ?F (37.3 ?C) (Rectal)   Resp 18   Ht '5\' 3"'$  (1.6 m)   Wt 75.6 kg   SpO2 99%   BMI 29.52 kg/m?  ?Physical Exam ?Vitals and nursing note reviewed.  ?Constitutional:   ?   General: She is not in acute distress. ?   Appearance: She is not diaphoretic.  ?HENT:  ?   Head: Normocephalic and atraumatic.  ?Eyes:  ?    General: No scleral icterus.    ?   Right eye: No discharge.     ?   Left eye: No discharge.  ?   Conjunctiva/sclera: Conjunctivae normal.  ?Cardiovascular:  ?   Rate and Rhythm: Normal rate and regular rhythm.  ?   Pulses: Normal pulses.  ?   Comments: 2+ DP/PT pulses bilaterally. ?Pulmonary:  ?   Effort: Pulmonary effort is normal. No respiratory distress.  ?   Breath sounds: No stridor.  ?Abdominal:  ?   General: There is no distension.  ?Musculoskeletal:  ?   Cervical back: Normal range of motion.  ?   Comments: No deformity noted to left knee.  No contusion  or edema noted to left knee.  ?Skin: ?   General: Skin is warm and dry.  ?Neurological:  ?   Mental Status: She is alert.  ?   Motor: No abnormal muscle tone.  ?   Comments: Patient is awake and alert, she is oriented to person, place, and time.  She is able to tell me the date and the day of the week.  She is able to perform basic math and reasoning. ?Her speech is not slurred.  She moves all 4 extremities spontaneously.  ?Psychiatric:  ?   Comments: Patient's answers are tangential, however she is otherwise calm and cooperative.  Speech is not rapid or pressured.  ? ? ?ED Results / Procedures / Treatments   ?Labs ?(all labs ordered are listed, but only abnormal results are displayed) ?Labs Reviewed  ?COMPREHENSIVE METABOLIC PANEL - Abnormal; Notable for the following components:  ?    Result Value  ? BUN 5 (*)   ? Total Protein 6.2 (*)   ? Albumin 3.4 (*)   ? All other components within normal limits  ?URINALYSIS, ROUTINE W REFLEX MICROSCOPIC - Abnormal; Notable for the following components:  ? Color, Urine STRAW (*)   ? Leukocytes,Ua TRACE (*)   ? All other components within normal limits  ?CBC WITH DIFFERENTIAL/PLATELET  ? ? ?EKG ?EKG Interpretation ? ?Date/Time:  Sunday March 02 2022 19:05:27 EDT ?Ventricular Rate:  70 ?PR Interval:  182 ?QRS Duration: 138 ?QT Interval:  459 ?QTC Calculation: 496 ?R Axis:   14 ?Text Interpretation: Sinus rhythm Right  bundle branch block Probable LVH with secondary repol abnrm ST elevation, consider inferior injury Borderline prolonged QT interval No significant change since last tracing Confirmed by Isla Pence [phone]) on 03/02/2022

## 2022-03-02 NOTE — ED Notes (Signed)
RN was walking to room with walker and socks to ambulate pt. Pt began scream out for her nurse. RN goes into room and pt has call bell in hand and is talking on the phone. Pt states that "I am talking to 911 because the doctor said I am being discharged so I am calling for a ride home." RN spoke with 911 operator and explained that the pt is at [facility] Emergency department and is being discharged but we are working on finding her a way home with our ambulance transportation services, and that pt is safe and being taken care of. RN explained to pt that we are calling our ambulance transportation services for her since she cannot safely get herself home and is a danger to herself. Pt verbalized understanding and is in agreement.  ?

## 2022-03-02 NOTE — ED Notes (Signed)
Pt given soda and [place] sandwich to eat.  ?

## 2022-03-02 NOTE — ED Notes (Signed)
Pt was recently admitted to the hospital. Pt stayed 4 days and was d/c yesterday. Pt states she did not sleep well, she states that "I fell out of bed last night and I got on my stomach and I peed on myself, and my caregiver came in the morning and she came around 4 pm today and cleaned my linen because there was vomit on my clothing on thursday". Pt now states she was talking on the phone all day with her POA's and they said she cant take of herself so she called 911 to come here and see a MD.  ?

## 2022-03-02 NOTE — ED Triage Notes (Signed)
Pt bib [facility] from [facility] c/o weakness. Pt states she feels like she has a fever.  ? ?EMS vitals ?178/68 BP ?97% RA ?88 HR ?107 CBG ?

## 2022-03-02 NOTE — Discharge Instructions (Addendum)
Today your work-up was reassuring.  Your x-rays on your knee looked okay. ?Please continue to take your medications at home and follow-up with your primary care doctor. ?

## 2022-03-02 NOTE — ED Notes (Signed)
Pt ripped out IV and monitoring equipment. Pt educated on the importance of having her BP and pulse ox. Pt verbalizes understanding but refuses to wear it. [place] MD aware.  ?

## 2022-03-03 ENCOUNTER — Emergency Department (HOSPITAL_COMMUNITY): Payer: Medicare HMO

## 2022-03-03 ENCOUNTER — Other Ambulatory Visit: Payer: Self-pay

## 2022-03-03 ENCOUNTER — Encounter (HOSPITAL_COMMUNITY): Payer: Self-pay

## 2022-03-03 ENCOUNTER — Emergency Department (EMERGENCY_DEPARTMENT_HOSPITAL)
Admission: EM | Admit: 2022-03-03 | Discharge: 2022-03-05 | Disposition: A | Discharge status: Home / Self Care | Payer: Medicare HMO | Source: Home / Self Care | Attending: Emergency Medicine | Admitting: Emergency Medicine

## 2022-03-03 DIAGNOSIS — R509 Fever, unspecified: Secondary | ICD-10-CM | POA: Diagnosis not present

## 2022-03-03 DIAGNOSIS — E039 Hypothyroidism, unspecified: Secondary | ICD-10-CM | POA: Insufficient documentation

## 2022-03-03 DIAGNOSIS — F309 Manic episode, unspecified: Secondary | ICD-10-CM

## 2022-03-03 DIAGNOSIS — R0602 Shortness of breath: Secondary | ICD-10-CM | POA: Insufficient documentation

## 2022-03-03 DIAGNOSIS — R112 Nausea with vomiting, unspecified: Secondary | ICD-10-CM | POA: Insufficient documentation

## 2022-03-03 DIAGNOSIS — F250 Schizoaffective disorder, bipolar type: Secondary | ICD-10-CM | POA: Insufficient documentation

## 2022-03-03 DIAGNOSIS — I509 Heart failure, unspecified: Secondary | ICD-10-CM | POA: Insufficient documentation

## 2022-03-03 DIAGNOSIS — R4182 Altered mental status, unspecified: Secondary | ICD-10-CM | POA: Insufficient documentation

## 2022-03-03 DIAGNOSIS — R600 Localized edema: Secondary | ICD-10-CM | POA: Insufficient documentation

## 2022-03-03 DIAGNOSIS — F29 Unspecified psychosis not due to a substance or known physiological condition: Secondary | ICD-10-CM | POA: Diagnosis not present

## 2022-03-03 DIAGNOSIS — Z87891 Personal history of nicotine dependence: Secondary | ICD-10-CM | POA: Insufficient documentation

## 2022-03-03 DIAGNOSIS — I110 Hypertensive heart disease with heart failure: Secondary | ICD-10-CM | POA: Insufficient documentation

## 2022-03-03 DIAGNOSIS — Z7401 Bed confinement status: Secondary | ICD-10-CM | POA: Diagnosis not present

## 2022-03-03 DIAGNOSIS — Z859 Personal history of malignant neoplasm, unspecified: Secondary | ICD-10-CM | POA: Insufficient documentation

## 2022-03-03 DIAGNOSIS — Z79899 Other long term (current) drug therapy: Secondary | ICD-10-CM | POA: Insufficient documentation

## 2022-03-03 DIAGNOSIS — I10 Essential (primary) hypertension: Secondary | ICD-10-CM | POA: Diagnosis not present

## 2022-03-03 DIAGNOSIS — M25562 Pain in left knee: Secondary | ICD-10-CM | POA: Insufficient documentation

## 2022-03-03 DIAGNOSIS — R531 Weakness: Secondary | ICD-10-CM | POA: Diagnosis not present

## 2022-03-03 DIAGNOSIS — Z20822 Contact with and (suspected) exposure to covid-19: Secondary | ICD-10-CM | POA: Insufficient documentation

## 2022-03-03 DIAGNOSIS — F319 Bipolar disorder, unspecified: Secondary | ICD-10-CM | POA: Diagnosis present

## 2022-03-03 DIAGNOSIS — J189 Pneumonia, unspecified organism: Secondary | ICD-10-CM | POA: Diagnosis not present

## 2022-03-03 LAB — URINALYSIS, ROUTINE W REFLEX MICROSCOPIC
Bilirubin Urine: NEGATIVE
Glucose, UA: NEGATIVE mg/dL
Hgb urine dipstick: NEGATIVE
Ketones, ur: NEGATIVE mg/dL
Leukocytes,Ua: NEGATIVE
Nitrite: NEGATIVE
Protein, ur: NEGATIVE mg/dL
Specific Gravity, Urine: 1.006 (ref 1.005–1.030)
pH: 8 (ref 5.0–8.0)

## 2022-03-03 LAB — COMPREHENSIVE METABOLIC PANEL
ALT: 23 U/L (ref 0–44)
AST: 18 U/L (ref 15–41)
Albumin: 3.5 g/dL (ref 3.5–5.0)
Alkaline Phosphatase: 42 U/L (ref 38–126)
Anion gap: 8 (ref 5–15)
BUN: 7 mg/dL — ABNORMAL LOW (ref 8–23)
CO2: 27 mmol/L (ref 22–32)
Calcium: 9.8 mg/dL (ref 8.9–10.3)
Chloride: 106 mmol/L (ref 98–111)
Creatinine, Ser: 0.76 mg/dL (ref 0.44–1.00)
GFR, Estimated: >60 mL/min (ref >60)
Glucose, Bld: 101 mg/dL — ABNORMAL HIGH (ref 70–99)
Potassium: 3.8 mmol/L (ref 3.5–5.1)
Sodium: 141 mmol/L (ref 135–145)
Total Bilirubin: 0.4 mg/dL (ref 0.3–1.2)
Total Protein: 6.7 g/dL (ref 6.5–8.1)

## 2022-03-03 LAB — CBC
HCT: 39.3 % (ref 36.0–46.0)
Hemoglobin: 13.5 g/dL (ref 12.0–15.0)
MCH: 31.3 pg (ref 26.0–34.0)
MCHC: 34.4 g/dL (ref 30.0–36.0)
MCV: 91.2 fL (ref 80.0–100.0)
Platelets: 253 10*3/uL (ref 150–400)
RBC: 4.31 MIL/uL (ref 3.87–5.11)
RDW: 13.8 % (ref 11.5–15.5)
WBC: 5 10*3/uL (ref 4.0–10.5)
nRBC: 0 % (ref 0.0–0.2)

## 2022-03-03 LAB — RESP PANEL BY RT-PCR (FLU A&B, COVID) ARPGX2
Influenza A by PCR: NEGATIVE
Influenza B by PCR: NEGATIVE
SARS Coronavirus 2 by RT PCR: NEGATIVE

## 2022-03-03 LAB — TSH: TSH: 11.042 u[IU]/mL — ABNORMAL HIGH (ref 0.350–4.500)

## 2022-03-03 MED ORDER — OLANZAPINE 5 MG PO TABS
15.0000 mg | ORAL_TABLET | Freq: Every day | ORAL | Status: DC
Start: 2022-03-03 — End: 2022-03-05
  Administered 2022-03-04 (×2): 15 mg via ORAL
  Filled 2022-03-03 (×2): qty 3

## 2022-03-03 MED ORDER — LEVOTHYROXINE SODIUM 25 MCG PO TABS
125.0000 ug | ORAL_TABLET | Freq: Every day | ORAL | Status: DC
  Administered 2022-03-04 – 2022-03-05 (×2): 125 ug via ORAL
  Filled 2022-03-03 (×2): qty 1

## 2022-03-03 MED ORDER — LAMOTRIGINE 150 MG PO TABS
150.0000 mg | ORAL_TABLET | Freq: Every day | ORAL | Status: DC
  Administered 2022-03-04 (×2): 150 mg via ORAL
  Filled 2022-03-03 (×2): qty 1

## 2022-03-03 MED ORDER — METOPROLOL TARTRATE 25 MG PO TABS: 25.0000 mg | ORAL_TABLET | Freq: Every morning | ORAL | Status: DC

## 2022-03-03 MED ORDER — LAMOTRIGINE 150 MG PO TABS: 150.0000 mg | ORAL_TABLET | Freq: Every day | ORAL | Status: DC

## 2022-03-03 MED ORDER — RAMELTEON 8 MG PO TABS
8.0000 mg | ORAL_TABLET | Freq: Every day | ORAL | Status: DC
Start: 2022-03-03 — End: 2022-03-05
  Administered 2022-03-04 (×2): 8 mg via ORAL
  Filled 2022-03-03 (×2): qty 1

## 2022-03-03 MED ORDER — OLANZAPINE 5 MG PO TABS: 15.0000 mg | ORAL_TABLET | Freq: Every day | ORAL | Status: DC

## 2022-03-03 MED ORDER — MEMANTINE HCL 10 MG PO TABS: 10.0000 mg | ORAL_TABLET | Freq: Every day | ORAL | Status: DC

## 2022-03-03 MED ORDER — RAMELTEON 8 MG PO TABS: 8.0000 mg | ORAL_TABLET | Freq: Every day | ORAL | Status: DC

## 2022-03-03 MED ORDER — QUETIAPINE FUMARATE 50 MG PO TABS
50.0000 mg | ORAL_TABLET | Freq: Every evening | ORAL | Status: DC | PRN
Start: 2022-03-03 — End: 2022-03-05
  Administered 2022-03-04: 50 mg via ORAL
  Filled 2022-03-03: qty 1

## 2022-03-03 MED ORDER — ALPRAZOLAM 0.25 MG PO TABS
1.0000 mg | ORAL_TABLET | Freq: Every day | ORAL | Status: DC
Start: 2022-03-03 — End: 2022-03-05
  Administered 2022-03-04 (×2): 1 mg via ORAL
  Filled 2022-03-03 (×2): qty 4

## 2022-03-03 MED ORDER — MEMANTINE HCL 10 MG PO TABS
10.0000 mg | ORAL_TABLET | Freq: Every day | ORAL | Status: DC
Start: 2022-03-03 — End: 2022-03-05
  Administered 2022-03-04 (×2): 10 mg via ORAL
  Filled 2022-03-03 (×2): qty 1

## 2022-03-03 MED ORDER — QUETIAPINE FUMARATE 50 MG PO TABS: 50.0000 mg | ORAL_TABLET | Freq: Every evening | ORAL | Status: DC | PRN

## 2022-03-03 MED ORDER — SPIRONOLACTONE 25 MG PO TABS
25.0000 mg | ORAL_TABLET | Freq: Every day | ORAL | Status: DC
Start: 2022-03-03 — End: 2022-03-05
  Administered 2022-03-03 – 2022-03-04 (×2): 25 mg via ORAL
  Filled 2022-03-03 (×4): qty 1

## 2022-03-03 MED ORDER — LEVOTHYROXINE SODIUM 25 MCG PO TABS: 125.0000 ug | ORAL_TABLET | ORAL | Status: DC

## 2022-03-03 MED ORDER — AMLODIPINE BESYLATE 5 MG PO TABS
2.5000 mg | ORAL_TABLET | Freq: Every day | ORAL | Status: DC
  Administered 2022-03-04 (×2): 2.5 mg via ORAL
  Filled 2022-03-03 (×2): qty 1

## 2022-03-03 NOTE — ED Notes (Signed)
Called [facility] to transport patient back to [facility] ?

## 2022-03-03 NOTE — ED Triage Notes (Signed)
Pt arrived to ED via EMS from [place]. Pt was seen here yesterday for shob, N/V, coughing up yellow mucus and increased chronic knee pain. Pt is here for the same today d/t she isn't feeling better. Per EMS, pt's POA and her friend are concerned about her mental health and reported that pt needs medically cleared for inpatient psychiatric treatment. Per POA and friend, pt has had moments of saying she doesn't need to take any of her medications. VSS w/ EMS. Pt is in NAD. Respirations equal and unlabored.  ?

## 2022-03-03 NOTE — ED Notes (Signed)
Pt's healthcare POA is [name] [phone] ?Pt's financial POA is [name] [phone] ?

## 2022-03-03 NOTE — ED Notes (Signed)
Patient is in ct 

## 2022-03-03 NOTE — ED Provider Notes (Signed)
?[facility] ?Provider Note ? ? ?CSN: [id number] ?Arrival date & time: 03/03/22  1139 ? ?  ? ?History ? ?Chief Complaint  ?Patient presents with  ? Knee Pain  ? Psychiatric Evaluation  ? Shortness of Breath  ? Nausea  ? Emesis  ? ? ?[name] is a 81 y.o. female. ? ?HPI ? ?81 year old female history of bipolar disorder, CHF, recent admission for community-acquired pneumonia presents today with reports of increased mania.  Patient lives in her own home alone.  She has not been sleeping.  She has been previously on chronic pain medication for her knee.  She has taken hydrocodone 10 mg 4 times daily.  [facility], [facility] that [facility] and she has not had any pain medicine received from a pharmacy.  She got some pain medicine from a friend.  She was in the hospital and received some pain medicine while in the hospital her friend and power of attorney is currently at the bedside.  He states that she has been not sleeping and has been calling people all night long each night.  She has been spending money at an increased rate.  She does have a financial power of attorney but they had not previously needed to control her spending habits.  [place] and it has been unclear to them which medication she is taking and is unable to tell me clearly except that she took Seroquel last night and still did not sleep.  She was discharged from the hospital on March 25 which was 2 days ago.  During that admission she was thought to have a kidney injury and had bibasilar infiltrates in her lungs with a small pancreatic head lesion.  Prior to discharge antibiotics were discontinued.  Her stool was positive for norovirus.  Her renal function improved during hospitalization with IV fluids her symptoms significantly improved and she was tolerating oral without difficulty prior to discharge.  She was discharged on Xanax but states she has not taken this.   Is unclear what she has done for narcotic pain medicine since discharge and is concerning that there may be some withdrawal going on.  Review of her mother medications include medications for blood pressure, chronic pain with gabapentin, quetiapine, ramelteon, ? ? ?Home Medications ?Prior to Admission medications   ?Medication Sig Start Date End Date Taking? Authorizing Provider  ?albuterol (VENTOLIN HFA) 108 (90 Base) MCG/ACT inhaler Inhale 2 puffs into the lungs every 6 (six) hours as needed for wheezing or shortness of breath. 03/01/22   Amin, Jeanella Flattery, MD  ?ALPRAZolam Duanne Moron) 0.5 MG tablet Take 0.5 mg by mouth 3 (three) times daily as needed for anxiety. Also takes '1mg'$  tabled scheduled ?Patient not taking: Reported on 02/28/2022    [provider]  ?ALPRAZolam Duanne Moron) 1 MG tablet Take 1 mg by mouth at bedtime. 02/08/21   [provider]  ?amLODipine (NORVASC) 2.5 MG tablet Take 2.5 mg by mouth at bedtime.    [provider]  ?Ascorbic Acid (VITAMIN C PO) Take 1 tablet by mouth every morning.    [provider]  ?BIOTIN PO Take 1 tablet by mouth every morning.    [provider]  ?Calcium Carbonate Antacid (CALCIUM CARBONATE PO) Take 1 tablet by mouth every morning.    [provider]  ?Cyanocobalamin (VITAMIN B-12 PO) Take 1 tablet by mouth every morning.    [provider]  ?  donepezil (ARICEPT) 10 MG tablet Take 10 mg by mouth at bedtime.    [provider]  ?fenofibrate (TRICOR) 145 MG tablet Take 145 mg by mouth at bedtime.    [provider]  ?gabapentin (NEURONTIN) 100 MG capsule TAKE 2 CAPSULES BY MOUTH AT BEDTIME ?Patient taking differently: 200 mg at bedtime. 01/24/22   Pieter Partridge, DO  ?hydrocortisone (CORTEF) 5 MG tablet TAKE 2 TABLETS WITH BREAKFAST AND 1 TABLET EVERY AFTERNOON BETWEEN 2-4 PM ?Patient taking differently: Take 5-10 mg by mouth See admin instructions. Take 2 tablets (10 mg) by mouth daily after breakfast  and 1 tablet (5 mg) after lunch 01/10/22   Shamleffer, Melanie Crazier, MD  ?lamoTRIgine (LAMICTAL) 150 MG tablet Take 150 mg by mouth at bedtime. 04/06/16   [provider]  ?levothyroxine (SYNTHROID) 125 MCG tablet Take 1 tablet (125 mcg total) by mouth as directed. 1.5 tablet on Sundays, 1 tablet Monday through Saturday ?Patient taking differently: Take 125 mcg by mouth daily before breakfast. 10/11/21   Shamleffer, Melanie Crazier, MD  ?loperamide (IMODIUM) 2 MG capsule Take 1 capsule (2 mg total) by mouth as needed for diarrhea or loose stools (If >6 episodes of diarrhea over 24 hrs.    Maximum daily dose = 16 mg). 03/01/22   Amin, Jeanella Flattery, MD  ?memantine (NAMENDA) 10 MG tablet Take 10 mg by mouth at bedtime. 06/21/20   [provider]  ?metoprolol tartrate (LOPRESSOR) 25 MG tablet Take 25 mg by mouth every morning.    [provider]  ?Multiple Vitamin (MULTIVITAMIN WITH MINERALS) TABS tablet Take 1 tablet by mouth every morning.    [provider]  ?Multiple Vitamins-Minerals (PRESERVISION AREDS 2) CAPS Take 1 capsule by mouth every morning.    [provider]  ?OLANZapine (ZYPREXA) 15 MG tablet Take 15 mg by mouth at bedtime.    [provider]  ?Omega-3 Fatty Acids (FISH OIL PO) Take 1 capsule by mouth every morning.    [provider]  ?omeprazole (PRILOSEC) 40 MG capsule Take 40 mg by mouth in the morning.    [provider]  ?polyvinyl alcohol (LIQUIFILM TEARS) 1.4 % ophthalmic solution Place 1 drop into both eyes every morning.    [provider]  ?QUEtiapine (SEROQUEL) 25 MG tablet Take 50 mg by mouth at bedtime as needed (sleep).    [provider]  ?ramelteon (ROZEREM) 8 MG tablet Take 8 mg by mouth at bedtime.    [provider]  ?rosuvastatin (CRESTOR) 20 MG tablet Take 1 tablet (20 mg total) by mouth daily. Need labs from PCP ?Patient taking differently: Take 20 mg by mouth at bedtime. Need labs from  PCP 12/17/21   Skeet Latch, MD  ?spironolactone (ALDACTONE) 25 MG tablet Take 1 tablet (25 mg total) by mouth daily. ?Patient taking differently: Take 25 mg by mouth every morning. 06/28/21   Skeet Latch, MD  ?   ? ?Allergies    ?Lithium, Fluoxetine, Macrodantin, Mirabegron, Nitrofurantoin, Paxil [paroxetine], Prednisone, Prozac [fluoxetine hcl], Sertraline hcl, Solifenacin, Sulfa antibiotics, and Wellbutrin [bupropion hcl]   ? ?Review of Systems   ?Review of Systems  ?Unable to perform ROS: Mental status change  ? ?Physical Exam ?Updated Vital Signs ?BP (!) 148/67   Pulse 74   Temp 97.8 ?F (36.6 ?C) (Rectal)   Resp 17   Ht 1.6 m ('5\' 3"'$ )   Wt 75.6 kg   SpO2 96%   BMI 29.52 kg/m?  ?Physical Exam ?Vitals and  nursing note reviewed.  ?Constitutional:   ?   Appearance: She is well-developed.  ?HENT:  ?   Head: Normocephalic and atraumatic.  ?   Mouth/Throat:  ?   Comments: Mucous membranes are dry ?Eyes:  ?   Pupils: Pupils are equal, round, and reactive to light.  ?Cardiovascular:  ?   Rate and Rhythm: Normal rate and regular rhythm.  ?Pulmonary:  ?   Effort: Pulmonary effort is normal.  ?   Breath sounds: Normal breath sounds.  ?Abdominal:  ?   General: Bowel sounds are normal.  ?   Palpations: Abdomen is soft.  ?Musculoskeletal:  ?   Cervical back: Normal range of motion and neck supple.  ?   Right lower leg: No tenderness. Edema present.  ?   Left lower leg: No tenderness. Edema present.  ?Skin: ?   General: Skin is warm and dry.  ?   Capillary Refill: Capillary refill takes less than 2 seconds.  ?Neurological:  ?   General: No focal deficit present.  ?   Mental Status: She is alert.  ?Psychiatric:     ?   Attention and Perception: Attention normal.     ?   Mood and Affect: Mood normal.     ?   Speech: Speech is rapid and pressured.     ?   Behavior: Behavior is cooperative.     ?   Thought Content: Thought content is not paranoid. Thought content does not include homicidal or suicidal ideation.  Thought content does not include homicidal or suicidal plan.     ?   Cognition and Memory: Cognition is impaired.     ?   Judgment: Judgment is impulsive.  ? ? ?ED Results / Procedures / Treatments   ?Labs ?(all

## 2022-03-03 NOTE — ED Notes (Signed)
[name] [phone] would like to talk to her friend asap ?
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
## 2022-03-03 NOTE — ED Notes (Signed)
Pt back from CT

## 2022-03-03 NOTE — ED Notes (Addendum)
Pt reports she doesn't know why she is here and that her friend says she hasn't been right for 20 yrs and something about schizophrenia. Pt asking for valium for her knee pain. Pt sighing heavily. Pt has hx of bipolar and schizoaffective.  ?

## 2022-03-03 NOTE — ED Notes (Signed)
PTAR at pt bedside for transfer back to her facility, [facility]. Report given to PTAR.  ?

## 2022-03-03 NOTE — ED Notes (Signed)
Pt intermittently talking out of her head and having some flight of ideas. ?

## 2022-03-03 NOTE — ED Notes (Signed)
Pt's POA at bedside and reports pt is in a manic state x 1 week. Pt has not been taking her pain meds d/t pharmacy won't fill them per the pt. Pt was taking hydrocodone '10mg'$  4 times daily for her knee. Pt then borrowed hydrocodone '5mg'$  from a friend for two days. Concern for opioid withdrawal. Pt's POA reports pt isn't eating regularly and not sleeping at night and calling all of her friends at night. Pt has been reporting that she feels like she is burning up x 1 week. Pt reports she didn't take any medications today.  ?

## 2022-03-04 DIAGNOSIS — F319 Bipolar disorder, unspecified: Secondary | ICD-10-CM | POA: Diagnosis not present

## 2022-03-04 LAB — CULTURE, BLOOD (ROUTINE X 2)
Culture: NO GROWTH
Special Requests: ADEQUATE

## 2022-03-04 NOTE — ED Notes (Signed)
[facility] POA updated on pt going to [facility]. ?

## 2022-03-04 NOTE — ED Notes (Signed)
Pt cleaned up and provided peri care with full linen change ?

## 2022-03-04 NOTE — Consult Note (Signed)
Telepsych Consultation  ? ?Reason for Consult:  Psychiatric Assessment  ?Referring Physician:  Dr. Pattricia Boss ?Location of Patient:    [name] ?Location of Provider: Other: virtual home office ? ?Patient Identification: [name] ?MRN:  [id number] ?Principal Diagnosis: Bipolar 1 disorder ([place]) ?Diagnosis:  Principal Problem: ?  Bipolar 1 disorder ([place]) ? ? ?Total Time spent with patient: 30 minutes ? ?Subjective:   ?[name] is a 81 y.o. female patient admitted with manic symptoms.  Patient states today, "I feel a little better since I had time to sleep, eat and I'm not as hyperactive." ? ?HPI:   ?Patient seen via telepsych by this provider; chart reviewed and consulted with Dr. Dwyane Dee on 03/04/22.  On evaluation [name] reports is laying in bed watching television.  She is alert and oriented to person, place, aware of the month and date. She offers circumstantial details regarding events that lead to current hospitalizations.  In summation, reports a history for bipolar disorder and chronic pain.  States she ran out of hydrocodone, pain increased and made it difficult to sleep.  Pt offers incongruent information regarding sleep, initially states she could not sleep prior to current admission, later states she slept 13-14 hours.  States the sleep deprivation contributed to mood instability. Patient is cooperative and tries to offer valid information during assessment but has intermittent moments of confusion.   ? 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  Acknowledges she missed a few days of psychiatric medications but unable to say how many days.  She is a widow and lives alone, has 2 step daughters who both live in another state.  States she does not have support at home and no one to help monitor for her safety if she was discharged to restart medications from outpatient setting.  In lieu of this, discussed  the current plan of care to restart medications, refer to gero psychiatric facility, [facility] for further inpatient treatment. Pt agrees with plan of care and denies concerns this writer could address.  ? ?Per Roberts Provider Admission Assessment 03/03/2022: ?Chief Complaint  ?Patient presents with  ? Knee Pain  ? Psychiatric Evaluation  ? Shortness of Breath  ? Nausea  ? Emesis  ?  ?  ?[name] is a 81 y.o. female. ?  ?HPI ?  ?81 year old female history of bipolar disorder, CHF, recent admission for community-acquired pneumonia presents today with reports of increased mania.  Patient lives in her own home alone.  She has not been sleeping.  She has been previously on chronic pain medication for her knee.  She has taken hydrocodone 10 mg 4 times daily.  Recently, the [facility] told her that they were out of it and she has not had any pain medicine received from a pharmacy.  She got some pain medicine from a friend.  She was in the hospital and received some pain medicine while in the hospital her friend and power of attorney is currently at the bedside.  He states that she has been not sleeping and has been calling people all night long each night.  She has been spending money at an increased rate.  She does have a financial power of attorney but they had not previously needed to control her spending habits.  [place] and it has been unclear to them which medication she is taking  and is unable to tell me clearly except that she took Seroquel last night and still did not sleep.  She was discharged from the hospital on March 25 which was 2 days ago.  During that admission she was thought to have a kidney injury and had bibasilar infiltrates in her lungs with a small pancreatic head lesion.  Prior to discharge antibiotics were discontinued.  Her stool was positive for norovirus.  Her renal function improved during hospitalization with IV fluids her symptoms significantly improved and she  was tolerating oral without difficulty prior to discharge.  She was discharged on Xanax but states she has not taken this.  Is unclear what she has done for narcotic pain medicine since discharge and is concerning that there may be some withdrawal going on.  Review of her mother medications include medications for blood pressure, chronic pain with gabapentin, quetiapine, ramelteon, ? ?Past Psychiatric History: Schizoaffective Disorder, Bipolar Type.  ? ?Risk to Self:  yes ?Risk to Others:  no ?Prior Inpatient Therapy:  no ?Prior Outpatient Therapy:  yes ? ?Past Medical History:  ?Past Medical History:  ?Diagnosis Date  ? Adrenal insufficiency ([place]) 07/25/2021  ? Anxiety   ? Arrhythmia   ? right bundle branch block  ? Bipolar 1 disorder ([place])   ? Cancer [facility])   ? Hyperlipidemia   ? Hypertension   ? Morbid obesity ([place])   ? Osteoarthritis   ? Schizo-affective schizophrenia ([place])   ? Thyroid disease   ? hypothyroidism  ?  ?Past Surgical History:  ?Procedure Laterality Date  ? BREAST SURGERY    ? CHOLECYSTECTOMY    ? DILATION AND CURETTAGE OF UTERUS    ? ESOPHAGEAL MANOMETRY N/A 01/29/2018  ? Procedure: ESOPHAGEAL MANOMETRY (EM);  Surgeon: Wonda Horner, MD;  Location: [facility];  Service: Endoscopy;  Laterality: N/A;  ? HAND SURGERY    ? Right-trigger finger  ? IR THORACENTESIS ASP PLEURAL SPACE W/IMG GUIDE  06/28/2018  ? IR THORACENTESIS ASP PLEURAL SPACE W/IMG GUIDE  07/19/2018  ? KNEE SURGERY    ? Left  ? TONSILLECTOMY    ? ?Family History:  ?Family History  ?Problem Relation Age of Onset  ? Hypertension Mother   ? Lung cancer Brother   ? Schizophrenia Brother   ? ?Family Psychiatric  History: unknown ?Social History:  ?Social History  ? ?Substance and Sexual Activity  ?Alcohol Use Not Currently  ? Comment: 1 time a year  ?   ?Social History  ? ?Substance and Sexual Activity  ?Drug Use No  ?  ?Social History  ? ?Socioeconomic History  ? Marital status: Divorced  ?  Spouse name: Not on file  ? Number of children: Not  on file  ? Years of education: Not on file  ? Highest education level: Not on file  ?Occupational History  ? Not on file  ?Tobacco Use  ? Smoking status: Former  ?  Types: Cigarettes  ?  Quit date: 12/08/1978  ?  Years since quitting: 43.2  ? Smokeless tobacco: Never  ?Vaping Use  ? Vaping Use: Never used  ?Substance and Sexual Activity  ? Alcohol use: Not Currently  ?  Comment: 1 time a year  ? Drug use: No  ? Sexual activity: Not on file  ?Other Topics Concern  ? Not on file  ?Social History Narrative  ? Lives in [place] (has been there 10 years) - states in independent living / has a nurse helps her with meds  ?   ?  Degrees in English Lit and nursing  ?   ? No caffeine  ?   ? ?Social Determinants of Health  ? ?Financial Resource Strain: Not on file  ?Food Insecurity: Not on file  ?Transportation Needs: Not on file  ?Physical Activity: Not on file  ?Stress: Not on file  ?Social Connections: Not on file  ? ?Additional Social History: ?  ? ?Allergies:   ?Allergies  ?Allergen Reactions  ? Lithium Nausea Only  ? Fluoxetine Other (See Comments)  ?  Pt felt crazy  ? Macrodantin Other (See Comments)  ?  Unknown reaction  ? Mirabegron Other (See Comments)  ?  ineffective  ? Nitrofurantoin Other (See Comments)  ?  Other reaction(s): Did not agree with me  ? Paxil [Paroxetine] Other (See Comments)  ?  Made pt feel crazy ?  ? Prednisone Other (See Comments)  ?  Makes her feel very jittery  ? Prozac [Fluoxetine Hcl] Other (See Comments)  ?  Pt felt crazy ?  ? Sertraline Hcl Other (See Comments)  ?  Unknown reaction  ? Solifenacin Other (See Comments)  ?  Ineffective   ? Sulfa Antibiotics Other (See Comments)  ?  Unknown reaction  ? Wellbutrin [Bupropion Hcl] Other (See Comments)  ?  Unknown reaction  ? ? ?Labs:  ?Results for orders placed or performed during the hospital encounter of 03/03/22 (from the past 48 hour(s))  ?CBC     Status: None  ? Collection Time: 03/03/22  1:10 PM  ?Result Value Ref Range  ? WBC 5.0 4.0 -  10.5 K/uL  ? RBC 4.31 3.87 - 5.11 MIL/uL  ? Hemoglobin 13.5 12.0 - 15.0 g/dL  ? HCT 39.3 36.0 - 46.0 %  ? MCV 91.2 80.0 - 100.0 fL  ? MCH 31.3 26.0 - 34.0 pg  ? MCHC 34.4 30.0 - 36.0 g/dL  ? RDW 13.8 11

## 2022-03-04 NOTE — ED Provider Notes (Signed)
Emergency Medicine Observation Re-evaluation Note ? ?[name] is a 81 y.o. female, seen on rounds today.  Pt initially presented to the ED for complaints of Knee Pain, Psychiatric Evaluation, Shortness of Breath, Nausea, and Emesis ?Currently, the patient is resting. ? ?Physical Exam  ?BP (!) 134/50   Pulse 95   Temp 98.2 ?F (36.8 ?C) (Oral)   Resp 18   Ht '5\' 3"'$  (1.6 m)   Wt 75.6 kg   SpO2 97%   BMI 29.52 kg/m?  ?Physical Exam ?General: Calm, no distress ?Cardiac: Warm and well-perfused ?Lungs: Even and unlabored ?Psych: Calm ? ?ED Course / MDM  ?EKG:EKG Interpretation ? ?Date/Time:  Monday March 03 2022 11:52:41 EDT ?Ventricular Rate:  84 ?PR Interval:  173 ?QRS Duration: 137 ?QT Interval:  424 ?QTC Calculation: 502 ?R Axis:   11 ?Text Interpretation: Sinus rhythm Right bundle branch block LVH with IVCD and secondary repol abnrm No significant change since last tracing Confirmed by Pattricia Boss [phone]) on 03/03/2022 2:40:26 PM ? ?I have reviewed the labs performed to date as well as medications administered while in observation.  Recent changes in the last 24 hours include TTS completed evaluation, but they are recommending Geri psych placement. ? ?Plan  ?Current plan is for placement per psychiatry. ? [name] is not under involuntary commitment. ? ? ?  ?Lucrezia Starch, MD ?03/04/22 1002 ? ?

## 2022-03-04 NOTE — ED Notes (Signed)
Cleaned pt bedding and changed sheets ?

## 2022-03-04 NOTE — ED Notes (Signed)
Pt breakfast finished and removed from bedside table. Purewick checked and is still in place. Pt called bell within reach. Family member present at bedside at this time ?

## 2022-03-04 NOTE — Progress Notes (Signed)
Patient has been denied by [facility] due to no beds available. Patient meets [facility] inpatient criteria per Margorie John, PA-C. Patient has been faxed out to the following facilities:  ? ?[facility]  [address] [place] [address] [phone]  ?[place], [place] [address] [phone]  ?[place], [place] [address] [phone]  ?[facility]  [address] [place] [address] [phone]  ?[facility]  [address] [place], [place] [address] [phone]  ?[facility]  [place], [place] [address] [phone]  ?[facility]  [address] [place] [address] [phone]  ?[facility]  [address] [place] [address] [phone]  ?[place]  [place] [place] [address] [phone]  ?[facility]  [address], [place] [address] [phone]  ?[facility]  [address], [place] [address] [phone]  ?[facility]  [address] [place] [address] [phone]  ?[place], [place] [address] [phone]  ?[facility]  [place], [place] [address] [phone]  ?[facility]  [address], [place] [address] [phone]  ?[place] [address] [place] [address] [phone]  ? ?Mariea Clonts, MSW, LCSW-A  ?10:52 AM 03/04/2022   ?

## 2022-03-04 NOTE — Progress Notes (Signed)
Pt was accepted to [facility] 03/05/22  tomorrow after 9:00am ? ?Pt meets inpatient criteria per Margorie John, PA-C ? ?Attending Physician will be Dr. Pearletha Forge   ? ?Report can be called to: [phone]   ? ?Pt can arrive after 9:00am on 03/05/22 ? ?Care Team notified: Brantley Persons, RN, Merlyn Lot, NP, and Madalyn Rob, MD ? ?Nadara Mode, LCSWA ?03/04/2022 @ 6:43 PM ? ?

## 2022-03-05 DIAGNOSIS — E785 Hyperlipidemia, unspecified: Secondary | ICD-10-CM | POA: Diagnosis not present

## 2022-03-05 DIAGNOSIS — R011 Cardiac murmur, unspecified: Secondary | ICD-10-CM | POA: Diagnosis not present

## 2022-03-05 DIAGNOSIS — R2689 Other abnormalities of gait and mobility: Secondary | ICD-10-CM | POA: Diagnosis not present

## 2022-03-05 DIAGNOSIS — Z8701 Personal history of pneumonia (recurrent): Secondary | ICD-10-CM | POA: Diagnosis not present

## 2022-03-05 DIAGNOSIS — I10 Essential (primary) hypertension: Secondary | ICD-10-CM | POA: Diagnosis not present

## 2022-03-05 DIAGNOSIS — F0390 Unspecified dementia without behavioral disturbance: Secondary | ICD-10-CM | POA: Diagnosis not present

## 2022-03-05 DIAGNOSIS — R059 Cough, unspecified: Secondary | ICD-10-CM | POA: Diagnosis not present

## 2022-03-05 DIAGNOSIS — G8929 Other chronic pain: Secondary | ICD-10-CM | POA: Diagnosis not present

## 2022-03-05 DIAGNOSIS — Z9181 History of falling: Secondary | ICD-10-CM | POA: Diagnosis not present

## 2022-03-05 DIAGNOSIS — R7303 Prediabetes: Secondary | ICD-10-CM | POA: Diagnosis not present

## 2022-03-05 DIAGNOSIS — Z712 Person consulting for explanation of examination or test findings: Secondary | ICD-10-CM | POA: Diagnosis not present

## 2022-03-05 DIAGNOSIS — F339 Major depressive disorder, recurrent, unspecified: Secondary | ICD-10-CM | POA: Diagnosis not present

## 2022-03-05 DIAGNOSIS — F259 Schizoaffective disorder, unspecified: Secondary | ICD-10-CM | POA: Diagnosis not present

## 2022-03-05 DIAGNOSIS — R296 Repeated falls: Secondary | ICD-10-CM | POA: Diagnosis not present

## 2022-03-05 DIAGNOSIS — A0811 Acute gastroenteropathy due to Norwalk agent: Secondary | ICD-10-CM | POA: Diagnosis not present

## 2022-03-05 DIAGNOSIS — Z79899 Other long term (current) drug therapy: Secondary | ICD-10-CM | POA: Diagnosis not present

## 2022-03-05 DIAGNOSIS — R6889 Other general symptoms and signs: Secondary | ICD-10-CM | POA: Diagnosis not present

## 2022-03-05 DIAGNOSIS — R799 Abnormal finding of blood chemistry, unspecified: Secondary | ICD-10-CM | POA: Diagnosis not present

## 2022-03-05 DIAGNOSIS — R5383 Other fatigue: Secondary | ICD-10-CM | POA: Diagnosis not present

## 2022-03-05 DIAGNOSIS — Z0489 Encounter for examination and observation for other specified reasons: Secondary | ICD-10-CM | POA: Diagnosis not present

## 2022-03-05 DIAGNOSIS — D649 Anemia, unspecified: Secondary | ICD-10-CM | POA: Diagnosis not present

## 2022-03-05 DIAGNOSIS — R531 Weakness: Secondary | ICD-10-CM | POA: Diagnosis not present

## 2022-03-05 DIAGNOSIS — Z20822 Contact with and (suspected) exposure to covid-19: Secondary | ICD-10-CM | POA: Diagnosis not present

## 2022-03-05 DIAGNOSIS — F332 Major depressive disorder, recurrent severe without psychotic features: Secondary | ICD-10-CM | POA: Diagnosis not present

## 2022-03-05 DIAGNOSIS — F250 Schizoaffective disorder, bipolar type: Secondary | ICD-10-CM | POA: Diagnosis not present

## 2022-03-05 DIAGNOSIS — I110 Hypertensive heart disease with heart failure: Secondary | ICD-10-CM | POA: Diagnosis not present

## 2022-03-05 DIAGNOSIS — Z20828 Contact with and (suspected) exposure to other viral communicable diseases: Secondary | ICD-10-CM | POA: Diagnosis not present

## 2022-03-05 DIAGNOSIS — M25562 Pain in left knee: Secondary | ICD-10-CM | POA: Diagnosis not present

## 2022-03-05 DIAGNOSIS — R509 Fever, unspecified: Secondary | ICD-10-CM | POA: Diagnosis not present

## 2022-03-05 DIAGNOSIS — E039 Hypothyroidism, unspecified: Secondary | ICD-10-CM | POA: Diagnosis not present

## 2022-03-05 DIAGNOSIS — A539 Syphilis, unspecified: Secondary | ICD-10-CM | POA: Diagnosis not present

## 2022-03-05 LAB — CULTURE, BLOOD (ROUTINE X 2)
Culture: NO GROWTH
Special Requests: ADEQUATE

## 2022-03-05 NOTE — ED Notes (Addendum)
Pt assisted with getting dressed. Pt assisted with getting into safe transport SUV and buckled  ? ?[facility] staffing confirmed that they will be able to provide a walker or wheelchair if needed  ? ?Rod Holler POA aware of pt being transported  ?

## 2022-03-05 NOTE — ED Notes (Signed)
This RN went by to check on the pt. Pt was in the bed naked with no brief on. Pt had taken her clothes and brief off and threw them in the floor. Pt's bed was soiled. This RN along with the help of a NT cleaned the pt up and changed the pt's entire bed. Pt was placed in a new gown and brief. Pt stated she was hot. This RN applied a cold wash cloth to the pt's head and neck. Will continue to monitor.  ?

## 2022-03-06 DIAGNOSIS — R6889 Other general symptoms and signs: Secondary | ICD-10-CM | POA: Diagnosis not present

## 2022-03-06 DIAGNOSIS — F332 Major depressive disorder, recurrent severe without psychotic features: Secondary | ICD-10-CM | POA: Diagnosis not present

## 2022-03-06 DIAGNOSIS — I10 Essential (primary) hypertension: Secondary | ICD-10-CM | POA: Diagnosis not present

## 2022-03-06 DIAGNOSIS — D649 Anemia, unspecified: Secondary | ICD-10-CM | POA: Diagnosis not present

## 2022-03-06 DIAGNOSIS — E039 Hypothyroidism, unspecified: Secondary | ICD-10-CM | POA: Diagnosis not present

## 2022-03-06 DIAGNOSIS — E785 Hyperlipidemia, unspecified: Secondary | ICD-10-CM | POA: Diagnosis not present

## 2022-03-06 DIAGNOSIS — F259 Schizoaffective disorder, unspecified: Secondary | ICD-10-CM | POA: Diagnosis not present

## 2022-03-08 DIAGNOSIS — E039 Hypothyroidism, unspecified: Secondary | ICD-10-CM | POA: Diagnosis not present

## 2022-03-08 DIAGNOSIS — R059 Cough, unspecified: Secondary | ICD-10-CM | POA: Diagnosis not present

## 2022-03-08 DIAGNOSIS — I10 Essential (primary) hypertension: Secondary | ICD-10-CM | POA: Diagnosis not present

## 2022-03-08 DIAGNOSIS — E785 Hyperlipidemia, unspecified: Secondary | ICD-10-CM | POA: Diagnosis not present

## 2022-03-08 DIAGNOSIS — F332 Major depressive disorder, recurrent severe without psychotic features: Secondary | ICD-10-CM | POA: Diagnosis not present

## 2022-03-09 DIAGNOSIS — E785 Hyperlipidemia, unspecified: Secondary | ICD-10-CM | POA: Diagnosis not present

## 2022-03-09 DIAGNOSIS — R7303 Prediabetes: Secondary | ICD-10-CM | POA: Diagnosis not present

## 2022-03-09 DIAGNOSIS — E039 Hypothyroidism, unspecified: Secondary | ICD-10-CM | POA: Diagnosis not present

## 2022-03-09 DIAGNOSIS — R011 Cardiac murmur, unspecified: Secondary | ICD-10-CM | POA: Diagnosis not present

## 2022-03-09 DIAGNOSIS — Z712 Person consulting for explanation of examination or test findings: Secondary | ICD-10-CM | POA: Diagnosis not present

## 2022-03-09 DIAGNOSIS — R799 Abnormal finding of blood chemistry, unspecified: Secondary | ICD-10-CM | POA: Diagnosis not present

## 2022-03-09 DIAGNOSIS — I10 Essential (primary) hypertension: Secondary | ICD-10-CM | POA: Diagnosis not present

## 2022-03-09 DIAGNOSIS — R059 Cough, unspecified: Secondary | ICD-10-CM | POA: Diagnosis not present

## 2022-03-09 DIAGNOSIS — F332 Major depressive disorder, recurrent severe without psychotic features: Secondary | ICD-10-CM | POA: Diagnosis not present

## 2022-03-11 DIAGNOSIS — Z0489 Encounter for examination and observation for other specified reasons: Secondary | ICD-10-CM | POA: Diagnosis not present

## 2022-03-11 DIAGNOSIS — F332 Major depressive disorder, recurrent severe without psychotic features: Secondary | ICD-10-CM | POA: Diagnosis not present

## 2022-03-11 DIAGNOSIS — I10 Essential (primary) hypertension: Secondary | ICD-10-CM | POA: Diagnosis not present

## 2022-03-11 DIAGNOSIS — Z9181 History of falling: Secondary | ICD-10-CM | POA: Diagnosis not present

## 2022-03-11 DIAGNOSIS — R2689 Other abnormalities of gait and mobility: Secondary | ICD-10-CM | POA: Diagnosis not present

## 2022-03-11 DIAGNOSIS — E785 Hyperlipidemia, unspecified: Secondary | ICD-10-CM | POA: Diagnosis not present

## 2022-03-13 ENCOUNTER — Other Ambulatory Visit: Payer: Self-pay | Admitting: Cardiovascular Disease

## 2022-03-13 NOTE — Telephone Encounter (Signed)
Rx(s) sent to pharmacy electronically.  

## 2022-03-14 ENCOUNTER — Other Ambulatory Visit: Payer: Self-pay | Admitting: Internal Medicine

## 2022-03-20 ENCOUNTER — Other Ambulatory Visit: Payer: Self-pay | Admitting: Internal Medicine

## 2022-03-20 ENCOUNTER — Telehealth: Payer: Self-pay

## 2022-03-20 MED ORDER — HYDROCORTISONE 5 MG PO TABS: 5.0000 mg | ORAL_TABLET | ORAL | 1 refills | Status: DC

## 2022-03-20 NOTE — Telephone Encounter (Signed)
Requesting a refill on Prednisone for low cortisol levels.  Would like sent to [facility].  ?

## 2022-03-21 DIAGNOSIS — F319 Bipolar disorder, unspecified: Secondary | ICD-10-CM | POA: Diagnosis not present

## 2022-03-21 DIAGNOSIS — N1831 Chronic kidney disease, stage 3a: Secondary | ICD-10-CM | POA: Diagnosis not present

## 2022-03-21 DIAGNOSIS — F259 Schizoaffective disorder, unspecified: Secondary | ICD-10-CM | POA: Diagnosis not present

## 2022-03-21 DIAGNOSIS — E039 Hypothyroidism, unspecified: Secondary | ICD-10-CM | POA: Diagnosis not present

## 2022-03-21 DIAGNOSIS — R011 Cardiac murmur, unspecified: Secondary | ICD-10-CM | POA: Diagnosis not present

## 2022-03-21 DIAGNOSIS — I129 Hypertensive chronic kidney disease with stage 1 through stage 4 chronic kidney disease, or unspecified chronic kidney disease: Secondary | ICD-10-CM | POA: Diagnosis not present

## 2022-03-21 DIAGNOSIS — G301 Alzheimer's disease with late onset: Secondary | ICD-10-CM | POA: Diagnosis not present

## 2022-03-25 DIAGNOSIS — M25562 Pain in left knee: Secondary | ICD-10-CM | POA: Diagnosis not present

## 2022-03-25 DIAGNOSIS — M6281 Muscle weakness (generalized): Secondary | ICD-10-CM | POA: Diagnosis not present

## 2022-03-25 DIAGNOSIS — R262 Difficulty in walking, not elsewhere classified: Secondary | ICD-10-CM | POA: Diagnosis not present

## 2022-03-26 DIAGNOSIS — R262 Difficulty in walking, not elsewhere classified: Secondary | ICD-10-CM | POA: Diagnosis not present

## 2022-03-26 DIAGNOSIS — M6281 Muscle weakness (generalized): Secondary | ICD-10-CM | POA: Diagnosis not present

## 2022-03-26 DIAGNOSIS — M25562 Pain in left knee: Secondary | ICD-10-CM | POA: Diagnosis not present

## 2022-03-27 DIAGNOSIS — R921 Mammographic calcification found on diagnostic imaging of breast: Secondary | ICD-10-CM | POA: Diagnosis not present

## 2022-03-27 DIAGNOSIS — Z853 Personal history of malignant neoplasm of breast: Secondary | ICD-10-CM | POA: Diagnosis not present

## 2022-03-31 DIAGNOSIS — M25562 Pain in left knee: Secondary | ICD-10-CM | POA: Diagnosis not present

## 2022-03-31 DIAGNOSIS — M6281 Muscle weakness (generalized): Secondary | ICD-10-CM | POA: Diagnosis not present

## 2022-03-31 DIAGNOSIS — R262 Difficulty in walking, not elsewhere classified: Secondary | ICD-10-CM | POA: Diagnosis not present

## 2022-04-02 DIAGNOSIS — M6281 Muscle weakness (generalized): Secondary | ICD-10-CM | POA: Diagnosis not present

## 2022-04-02 DIAGNOSIS — M25511 Pain in right shoulder: Secondary | ICD-10-CM | POA: Diagnosis not present

## 2022-04-04 DIAGNOSIS — M6281 Muscle weakness (generalized): Secondary | ICD-10-CM | POA: Diagnosis not present

## 2022-04-04 DIAGNOSIS — M25562 Pain in left knee: Secondary | ICD-10-CM | POA: Diagnosis not present

## 2022-04-04 DIAGNOSIS — R262 Difficulty in walking, not elsewhere classified: Secondary | ICD-10-CM | POA: Diagnosis not present

## 2022-04-07 DIAGNOSIS — M6281 Muscle weakness (generalized): Secondary | ICD-10-CM | POA: Diagnosis not present

## 2022-04-07 DIAGNOSIS — R262 Difficulty in walking, not elsewhere classified: Secondary | ICD-10-CM | POA: Diagnosis not present

## 2022-04-07 DIAGNOSIS — M25562 Pain in left knee: Secondary | ICD-10-CM | POA: Diagnosis not present

## 2022-04-08 DIAGNOSIS — M25511 Pain in right shoulder: Secondary | ICD-10-CM | POA: Diagnosis not present

## 2022-04-08 DIAGNOSIS — M6281 Muscle weakness (generalized): Secondary | ICD-10-CM | POA: Diagnosis not present

## 2022-04-09 DIAGNOSIS — M25511 Pain in right shoulder: Secondary | ICD-10-CM | POA: Diagnosis not present

## 2022-04-09 DIAGNOSIS — M6281 Muscle weakness (generalized): Secondary | ICD-10-CM | POA: Diagnosis not present

## 2022-04-10 ENCOUNTER — Ambulatory Visit: Payer: Medicare HMO | Admitting: Internal Medicine

## 2022-04-10 DIAGNOSIS — M25511 Pain in right shoulder: Secondary | ICD-10-CM | POA: Diagnosis not present

## 2022-04-10 DIAGNOSIS — M6281 Muscle weakness (generalized): Secondary | ICD-10-CM | POA: Diagnosis not present

## 2022-04-11 DIAGNOSIS — R262 Difficulty in walking, not elsewhere classified: Secondary | ICD-10-CM | POA: Diagnosis not present

## 2022-04-11 DIAGNOSIS — M25562 Pain in left knee: Secondary | ICD-10-CM | POA: Diagnosis not present

## 2022-04-11 DIAGNOSIS — M6281 Muscle weakness (generalized): Secondary | ICD-10-CM | POA: Diagnosis not present

## 2022-04-14 DIAGNOSIS — K649 Unspecified hemorrhoids: Secondary | ICD-10-CM | POA: Diagnosis not present

## 2022-04-16 DIAGNOSIS — M6281 Muscle weakness (generalized): Secondary | ICD-10-CM | POA: Diagnosis not present

## 2022-04-16 DIAGNOSIS — M25511 Pain in right shoulder: Secondary | ICD-10-CM | POA: Diagnosis not present

## 2022-04-16 NOTE — Progress Notes (Deleted)
NEUROLOGY FOLLOW UP OFFICE NOTE  [name] [id number]  Assessment/Plan:   1.  Cervicogenic headache 2.  Cervical spondylosis   1.  Gabapentin '200mg'$  at bedtime 2.  Follow up one year   Subjective:  [name] is a 16 year right-handed old female with HTN, HLD, hypothyroidism, Bipolar 1 disorder, schizoaffective disorder, Alzheimer's disease and history of breast cancer who follows up for headache.   UPDATE: ***   Current NSAIDS:  Ibuprofen '400mg'$  twice daily with food Current analgesics:  Tylenol, hydrocodone-acetaminophen Current triptans:  none Current ergotamine:  none Current anti-emetic:  Zofran '4mg'$  Current muscle relaxants:  none Current anti-anxiolytic:  alprazolam Current sleep aide:  ramelteon '8mg'$  Current Antihypertensive medications:  Metoprolol tartrate '25mg'$  daily, spironolactone Current Antidepressant/antipsychotic medications:  Olanzapine '15mg'$  twice daily, Seroquel '25mg'$  daily Current Anticonvulsant medications:   Gabapentin 200 mg at bedtime; lamotrigine '150mg'$  daily Current anti-CGRP:  none Current Vitamins/Herbal/Supplements:  D, Fish  Oil, MVI Current Antihistamines/Decongestants:  none Other therapy:  none Hormone/birth control:  none Other medications:  Synthroid, memantine.  She stopped donepezil and nausea resolved.     HISTORY:  Onset:  Around April 2020.  No preceding event. Location:  Back of head, usually occurs at night, not during the day.  Non-radiating. Quality:  Non-throbbing Initial intensity:  Mild to moderate.  She denies thunderclap headache Aura:  none Premonitory Phase:  none Postdrome:  none Associated symptoms:  She denies associated scalp paresthesias/allodynia, nausea, vomiting, photophobia, phonophobia, visual disturbance or unilateral numbness or weakness. Initial duration:  Feels it as she goes to bed and falls asleep. If she wakes up throughout the night, she may feel it.  Once she gets up in the morning, she feels okay.   No headaches during the day.   Initial Frequency:  Almost every night Triggers:  Laying down Relieving factors:  Getting up. Activity:  Does not aggravate She followed up with her orthopedist, Dr. Gladstone Lighter, who ordered a cervical X-ray and was told she had multilevel arthritis.  She was prescribed ibuprofen.       Past imaging (personally reviewed): 04/27/2013 CT Head:  Atrophy and mild chronic small vessel ischemic changes in white matter. No acute intracranial abnormality. 04/27/2013 CT Cervical Spine:  Moderate to advanced cervical degenerative disease with spondylosis and facet hypertrophy as well as multilevel spinal stenosis most prominent at C5-6 and C6-7, as well as foraminal stenosis bilaterally most prominent at C3-4 and C4-5 and on right at C5-6. 07/05/2009 MRI Brain w/o:  Chronic small vessel ischemic changes in hemispheric white matter and frontal and temporal atrophy.  No acute intracranial abnormality. 10/11/2008 MRI Brain w/o:  Frontal and temporal atrophy and chronic small vessel ischemic changes in hemispheric white matter.  No acute intracranial abnormality.   Past NSAIDS:  Ibuprofen '800mg'$  twice daily (made her feel unsteady with slurred speech), naproxen '220mg'$  Past analgesics: none Past abortive triptans:  none Past abortive ergotamine:  none Past muscle relaxants:  Tizanidine '4mg'$  at bedtime (ineffective) Past anti-emetic:  none Past antihypertensive medications:  none Past antidepressant medications:  none Past anticonvulsant medications:  none Past anti-CGRP:  none Past vitamins/Herbal/Supplements:  none Past antihistamines/decongestants:  none Other past therapies:  none  PAST MEDICAL HISTORY: Past Medical History:  Diagnosis Date   Adrenal insufficiency ([place]) 07/25/2021   Anxiety    Arrhythmia    right bundle branch block   Bipolar 1 disorder ([facility])    Cancer ([place])    Hyperlipidemia    Hypertension  Morbid obesity ([place])    Osteoarthritis    Schizo-affective  schizophrenia ([place])    Thyroid disease    hypothyroidism    MEDICATIONS: Current Outpatient Medications on File Prior to Visit  Medication Sig Dispense Refill   albuterol (VENTOLIN HFA) 108 (90 Base) MCG/ACT inhaler Inhale 2 puffs into the lungs every 6 (six) hours as needed for wheezing or shortness of breath. (Patient not taking: Reported on 03/04/2022) 8 g 2   ALPRAZolam (XANAX) 1 MG tablet Take 1 mg by mouth at bedtime.     amLODipine (NORVASC) 2.5 MG tablet Take 2.5 mg by mouth at bedtime.     Ascorbic Acid (VITAMIN C PO) Take 1 tablet by mouth every morning.     BIOTIN PO Take 1 tablet by mouth every morning.     Calcium Carbonate Antacid (CALCIUM CARBONATE PO) Take 1 tablet by mouth every morning.     Cyanocobalamin (VITAMIN B-12 PO) Take 1 tablet by mouth every morning.     donepezil (ARICEPT) 10 MG tablet Take 10 mg by mouth at bedtime.     fenofibrate (TRICOR) 145 MG tablet Take 145 mg by mouth at bedtime.     gabapentin (NEURONTIN) 100 MG capsule TAKE 2 CAPSULES BY MOUTH AT BEDTIME (Patient taking differently: 200 mg at bedtime.) 60 capsule 6   hydrocortisone (CORTEF) 5 MG tablet Take 1 tablet (5 mg total) by mouth as directed. 2 tabs with breakfast, and 1 tablet in the afternoon 300 tablet 1   lamoTRIgine (LAMICTAL) 150 MG tablet Take 150 mg by mouth at bedtime.  12   levothyroxine (SYNTHROID) 125 MCG tablet Take 1 tablet (125 mcg total) by mouth as directed. 1.5 tablet on Sundays, 1 tablet Monday through Saturday 98 tablet 2   loperamide (IMODIUM) 2 MG capsule Take 1 capsule (2 mg total) by mouth as needed for diarrhea or loose stools (If >6 episodes of diarrhea over 24 hrs.    Maximum daily dose = 16 mg). (Patient not taking: Reported on 03/04/2022) 15 capsule 0   memantine (NAMENDA) 10 MG tablet Take 10 mg by mouth at bedtime.     metoprolol tartrate (LOPRESSOR) 25 MG tablet Take 25 mg by mouth every morning.     Multiple Vitamin (MULTIVITAMIN WITH MINERALS) TABS tablet Take 1  tablet by mouth every morning.     Multiple Vitamins-Minerals (PRESERVISION AREDS 2) CAPS Take 1 capsule by mouth every morning.     OLANZapine (ZYPREXA) 15 MG tablet Take 15 mg by mouth at bedtime.     Omega-3 Fatty Acids (FISH OIL PO) Take 1 capsule by mouth every morning.     omeprazole (PRILOSEC) 40 MG capsule Take 40 mg by mouth in the morning.     polyvinyl alcohol (LIQUIFILM TEARS) 1.4 % ophthalmic solution Place 1 drop into both eyes every morning.     QUEtiapine (SEROQUEL) 25 MG tablet Take 50 mg by mouth at bedtime as needed (sleep).     ramelteon (ROZEREM) 8 MG tablet Take 8 mg by mouth at bedtime.     rosuvastatin (CRESTOR) 20 MG tablet Take 1 tablet (20 mg total) by mouth daily. Need labs from PCP (Patient taking differently: Take 20 mg by mouth at bedtime.) 90 tablet 0   spironolactone (ALDACTONE) 25 MG tablet TAKE 1 TABLET (25 MG TOTAL) BY MOUTH DAILY. 90 tablet 0   No current facility-administered medications on file prior to visit.    ALLERGIES: Allergies  Allergen Reactions   Lithium Nausea Only  Fluoxetine Other (See Comments)    Pt felt crazy   Macrodantin Other (See Comments)    Unknown reaction   Mirabegron Other (See Comments)    ineffective   Nitrofurantoin Other (See Comments)    Other reaction(s): Did not agree with me   Paxil [Paroxetine] Other (See Comments)    Made pt feel crazy    Prednisone Other (See Comments)    Makes her feel very jittery   Prozac [Fluoxetine Hcl] Other (See Comments)    Pt felt crazy    Sertraline Hcl Other (See Comments)    Unknown reaction   Solifenacin Other (See Comments)    Ineffective    Sulfa Antibiotics Other (See Comments)    Unknown reaction   Wellbutrin [Bupropion Hcl] Other (See Comments)    Unknown reaction    FAMILY HISTORY: Family History  Problem Relation Age of Onset   Hypertension Mother    Lung cancer Brother    Schizophrenia Brother       Objective:  *** General: No acute distress.  Patient  appears well-groomed.   Head:  Normocephalic/atraumatic Eyes:  Fundi examined but not visualized Neck: supple, no paraspinal tenderness, full range of motion Heart:  Regular rate and rhythm Neurological Exam:  alert and oriented to person, place, and time. Speech fluent and not dysarthric, language intact.  Slight saccadic eye movements with tracking.  Otherwise, CN II-XII intact. Bulk and tone normal, muscle strength 5/5 throughout.  Mild postural and kinetic tremor in hands.  Sensation to light touch intact.  Deep tendon reflexes absent throughout, toes downgoing.  Finger to nose testing without ataxia.  Unsteady stance and gait.  Ambulates with walker.    Metta Clines, DO  CC: [name], MD

## 2022-04-17 ENCOUNTER — Encounter: Payer: Self-pay | Admitting: Neurology

## 2022-04-17 ENCOUNTER — Ambulatory Visit: Payer: Medicare HMO | Admitting: Neurology

## 2022-04-17 DIAGNOSIS — M25562 Pain in left knee: Secondary | ICD-10-CM | POA: Diagnosis not present

## 2022-04-17 DIAGNOSIS — M6281 Muscle weakness (generalized): Secondary | ICD-10-CM | POA: Diagnosis not present

## 2022-04-17 DIAGNOSIS — Z029 Encounter for administrative examinations, unspecified: Secondary | ICD-10-CM

## 2022-04-17 DIAGNOSIS — R262 Difficulty in walking, not elsewhere classified: Secondary | ICD-10-CM | POA: Diagnosis not present

## 2022-04-21 DIAGNOSIS — M25511 Pain in right shoulder: Secondary | ICD-10-CM | POA: Diagnosis not present

## 2022-04-21 DIAGNOSIS — M6281 Muscle weakness (generalized): Secondary | ICD-10-CM | POA: Diagnosis not present

## 2022-04-23 DIAGNOSIS — M25562 Pain in left knee: Secondary | ICD-10-CM | POA: Diagnosis not present

## 2022-04-23 DIAGNOSIS — M1712 Unilateral primary osteoarthritis, left knee: Secondary | ICD-10-CM | POA: Diagnosis not present

## 2022-04-24 DIAGNOSIS — M6281 Muscle weakness (generalized): Secondary | ICD-10-CM | POA: Diagnosis not present

## 2022-04-24 DIAGNOSIS — M25511 Pain in right shoulder: Secondary | ICD-10-CM | POA: Diagnosis not present

## 2022-04-28 DIAGNOSIS — M25511 Pain in right shoulder: Secondary | ICD-10-CM | POA: Diagnosis not present

## 2022-04-28 DIAGNOSIS — M6281 Muscle weakness (generalized): Secondary | ICD-10-CM | POA: Diagnosis not present

## 2022-05-01 DIAGNOSIS — M6281 Muscle weakness (generalized): Secondary | ICD-10-CM | POA: Diagnosis not present

## 2022-05-01 DIAGNOSIS — M25511 Pain in right shoulder: Secondary | ICD-10-CM | POA: Diagnosis not present

## 2022-05-07 ENCOUNTER — Ambulatory Visit: Payer: Medicare HMO | Admitting: Neurology

## 2022-05-08 DIAGNOSIS — Z85828 Personal history of other malignant neoplasm of skin: Secondary | ICD-10-CM | POA: Diagnosis not present

## 2022-05-08 DIAGNOSIS — M25511 Pain in right shoulder: Secondary | ICD-10-CM | POA: Diagnosis not present

## 2022-05-08 DIAGNOSIS — L821 Other seborrheic keratosis: Secondary | ICD-10-CM | POA: Diagnosis not present

## 2022-05-08 DIAGNOSIS — M6281 Muscle weakness (generalized): Secondary | ICD-10-CM | POA: Diagnosis not present

## 2022-05-08 DIAGNOSIS — D0439 Carcinoma in situ of skin of other parts of face: Secondary | ICD-10-CM | POA: Diagnosis not present

## 2022-05-08 DIAGNOSIS — B078 Other viral warts: Secondary | ICD-10-CM | POA: Diagnosis not present

## 2022-05-08 DIAGNOSIS — R208 Other disturbances of skin sensation: Secondary | ICD-10-CM | POA: Diagnosis not present

## 2022-05-12 DIAGNOSIS — M25511 Pain in right shoulder: Secondary | ICD-10-CM | POA: Diagnosis not present

## 2022-05-12 DIAGNOSIS — M6281 Muscle weakness (generalized): Secondary | ICD-10-CM | POA: Diagnosis not present

## 2022-05-14 DIAGNOSIS — M6281 Muscle weakness (generalized): Secondary | ICD-10-CM | POA: Diagnosis not present

## 2022-05-14 DIAGNOSIS — M25511 Pain in right shoulder: Secondary | ICD-10-CM | POA: Diagnosis not present

## 2022-05-16 DIAGNOSIS — K5909 Other constipation: Secondary | ICD-10-CM | POA: Diagnosis not present

## 2022-05-16 DIAGNOSIS — K644 Residual hemorrhoidal skin tags: Secondary | ICD-10-CM | POA: Diagnosis not present

## 2022-05-19 DIAGNOSIS — M1712 Unilateral primary osteoarthritis, left knee: Secondary | ICD-10-CM | POA: Diagnosis not present

## 2022-05-19 DIAGNOSIS — M25511 Pain in right shoulder: Secondary | ICD-10-CM | POA: Diagnosis not present

## 2022-05-19 DIAGNOSIS — M25562 Pain in left knee: Secondary | ICD-10-CM | POA: Diagnosis not present

## 2022-05-19 DIAGNOSIS — R262 Difficulty in walking, not elsewhere classified: Secondary | ICD-10-CM | POA: Diagnosis not present

## 2022-05-19 DIAGNOSIS — M6281 Muscle weakness (generalized): Secondary | ICD-10-CM | POA: Diagnosis not present

## 2022-05-22 DIAGNOSIS — M1712 Unilateral primary osteoarthritis, left knee: Secondary | ICD-10-CM | POA: Diagnosis not present

## 2022-05-22 DIAGNOSIS — R262 Difficulty in walking, not elsewhere classified: Secondary | ICD-10-CM | POA: Diagnosis not present

## 2022-05-22 DIAGNOSIS — M25511 Pain in right shoulder: Secondary | ICD-10-CM | POA: Diagnosis not present

## 2022-05-22 DIAGNOSIS — M6281 Muscle weakness (generalized): Secondary | ICD-10-CM | POA: Diagnosis not present

## 2022-05-22 DIAGNOSIS — M25562 Pain in left knee: Secondary | ICD-10-CM | POA: Diagnosis not present

## 2022-05-26 DIAGNOSIS — M1712 Unilateral primary osteoarthritis, left knee: Secondary | ICD-10-CM | POA: Diagnosis not present

## 2022-05-26 DIAGNOSIS — M6281 Muscle weakness (generalized): Secondary | ICD-10-CM | POA: Diagnosis not present

## 2022-05-26 DIAGNOSIS — M25562 Pain in left knee: Secondary | ICD-10-CM | POA: Diagnosis not present

## 2022-05-26 DIAGNOSIS — R262 Difficulty in walking, not elsewhere classified: Secondary | ICD-10-CM | POA: Diagnosis not present

## 2022-05-28 DIAGNOSIS — R262 Difficulty in walking, not elsewhere classified: Secondary | ICD-10-CM | POA: Diagnosis not present

## 2022-05-28 DIAGNOSIS — M1712 Unilateral primary osteoarthritis, left knee: Secondary | ICD-10-CM | POA: Diagnosis not present

## 2022-05-28 DIAGNOSIS — M6281 Muscle weakness (generalized): Secondary | ICD-10-CM | POA: Diagnosis not present

## 2022-05-28 DIAGNOSIS — M25562 Pain in left knee: Secondary | ICD-10-CM | POA: Diagnosis not present

## 2022-05-29 DIAGNOSIS — M6281 Muscle weakness (generalized): Secondary | ICD-10-CM | POA: Diagnosis not present

## 2022-05-29 DIAGNOSIS — M25511 Pain in right shoulder: Secondary | ICD-10-CM | POA: Diagnosis not present

## 2022-05-30 ENCOUNTER — Other Ambulatory Visit: Payer: Self-pay | Admitting: Cardiovascular Disease

## 2022-06-02 DIAGNOSIS — M25511 Pain in right shoulder: Secondary | ICD-10-CM | POA: Diagnosis not present

## 2022-06-02 DIAGNOSIS — M6281 Muscle weakness (generalized): Secondary | ICD-10-CM | POA: Diagnosis not present

## 2022-06-03 DIAGNOSIS — M6281 Muscle weakness (generalized): Secondary | ICD-10-CM | POA: Diagnosis not present

## 2022-06-03 DIAGNOSIS — M25562 Pain in left knee: Secondary | ICD-10-CM | POA: Diagnosis not present

## 2022-06-03 DIAGNOSIS — R262 Difficulty in walking, not elsewhere classified: Secondary | ICD-10-CM | POA: Diagnosis not present

## 2022-06-03 DIAGNOSIS — M1712 Unilateral primary osteoarthritis, left knee: Secondary | ICD-10-CM | POA: Diagnosis not present

## 2022-06-04 ENCOUNTER — Telehealth: Payer: Self-pay

## 2022-06-04 DIAGNOSIS — I10 Essential (primary) hypertension: Secondary | ICD-10-CM | POA: Diagnosis not present

## 2022-06-04 DIAGNOSIS — M6281 Muscle weakness (generalized): Secondary | ICD-10-CM | POA: Diagnosis not present

## 2022-06-04 DIAGNOSIS — N1831 Chronic kidney disease, stage 3a: Secondary | ICD-10-CM | POA: Diagnosis not present

## 2022-06-04 DIAGNOSIS — E2740 Unspecified adrenocortical insufficiency: Secondary | ICD-10-CM | POA: Diagnosis not present

## 2022-06-04 DIAGNOSIS — E039 Hypothyroidism, unspecified: Secondary | ICD-10-CM | POA: Diagnosis not present

## 2022-06-04 DIAGNOSIS — M25511 Pain in right shoulder: Secondary | ICD-10-CM | POA: Diagnosis not present

## 2022-06-04 NOTE — Telephone Encounter (Signed)
Spoke  wit Dr.[place] office and advised per Dr. Kelton Pillar patient is suppose to be taking the Hydrocortisone 5 mg  2 tabs in morning and 1 tab in afternoon.

## 2022-06-05 DIAGNOSIS — M6281 Muscle weakness (generalized): Secondary | ICD-10-CM | POA: Diagnosis not present

## 2022-06-05 DIAGNOSIS — R262 Difficulty in walking, not elsewhere classified: Secondary | ICD-10-CM | POA: Diagnosis not present

## 2022-06-05 DIAGNOSIS — M25562 Pain in left knee: Secondary | ICD-10-CM | POA: Diagnosis not present

## 2022-06-05 DIAGNOSIS — M1712 Unilateral primary osteoarthritis, left knee: Secondary | ICD-10-CM | POA: Diagnosis not present

## 2022-06-09 DIAGNOSIS — M6281 Muscle weakness (generalized): Secondary | ICD-10-CM | POA: Diagnosis not present

## 2022-06-09 DIAGNOSIS — R262 Difficulty in walking, not elsewhere classified: Secondary | ICD-10-CM | POA: Diagnosis not present

## 2022-06-09 DIAGNOSIS — M25562 Pain in left knee: Secondary | ICD-10-CM | POA: Diagnosis not present

## 2022-06-09 DIAGNOSIS — M1712 Unilateral primary osteoarthritis, left knee: Secondary | ICD-10-CM | POA: Diagnosis not present

## 2022-06-11 DIAGNOSIS — R262 Difficulty in walking, not elsewhere classified: Secondary | ICD-10-CM | POA: Diagnosis not present

## 2022-06-11 DIAGNOSIS — M25511 Pain in right shoulder: Secondary | ICD-10-CM | POA: Diagnosis not present

## 2022-06-11 DIAGNOSIS — M25562 Pain in left knee: Secondary | ICD-10-CM | POA: Diagnosis not present

## 2022-06-11 DIAGNOSIS — M1712 Unilateral primary osteoarthritis, left knee: Secondary | ICD-10-CM | POA: Diagnosis not present

## 2022-06-11 DIAGNOSIS — M6281 Muscle weakness (generalized): Secondary | ICD-10-CM | POA: Diagnosis not present

## 2022-06-16 DIAGNOSIS — M1712 Unilateral primary osteoarthritis, left knee: Secondary | ICD-10-CM | POA: Diagnosis not present

## 2022-06-16 DIAGNOSIS — M6281 Muscle weakness (generalized): Secondary | ICD-10-CM | POA: Diagnosis not present

## 2022-06-16 DIAGNOSIS — R262 Difficulty in walking, not elsewhere classified: Secondary | ICD-10-CM | POA: Diagnosis not present

## 2022-06-16 DIAGNOSIS — M25562 Pain in left knee: Secondary | ICD-10-CM | POA: Diagnosis not present

## 2022-06-19 DIAGNOSIS — M25562 Pain in left knee: Secondary | ICD-10-CM | POA: Diagnosis not present

## 2022-06-19 DIAGNOSIS — M1712 Unilateral primary osteoarthritis, left knee: Secondary | ICD-10-CM | POA: Diagnosis not present

## 2022-06-19 DIAGNOSIS — M6281 Muscle weakness (generalized): Secondary | ICD-10-CM | POA: Diagnosis not present

## 2022-06-19 DIAGNOSIS — R262 Difficulty in walking, not elsewhere classified: Secondary | ICD-10-CM | POA: Diagnosis not present

## 2022-06-19 DIAGNOSIS — M25511 Pain in right shoulder: Secondary | ICD-10-CM | POA: Diagnosis not present

## 2022-06-23 ENCOUNTER — Telehealth: Payer: Self-pay

## 2022-06-23 NOTE — Telephone Encounter (Signed)
Patient is having a knee replacement and wants to know if she needs to make any changes to the Hydrocortisone.

## 2022-06-23 NOTE — Telephone Encounter (Signed)
Patient notified and verbalized understanding. 

## 2022-06-23 NOTE — Progress Notes (Signed)
DUE TO COVID-19 ONLY ONE VISITOR IS ALLOWED TO COME WITH YOU AND STAY IN THE WAITING ROOM ONLY DURING PRE OP AND PROCEDURE DAY OF SURGERY.  2 VISITOR  MAY VISIT WITH YOU AFTER SURGERY IN YOUR PRIVATE ROOM DURING VISITING HOURS ONLY! YOU MAY HAVE ONE PERSON SPEND THE NITE WITH YOU IN YOUR ROOM AFTER SURGERY.     Your procedure is scheduled on:            07/18/2022   Report to [facility] Main  Entrance   Report to admitting at  0730am               AM DO NOT BRING INSURANCE CARD, PICTURE ID OR WALLET DAY OF SURGERY.      Call this number if you have problems the morning of surgery [phone]    REMEMBER: NO  SOLID FOODS , CANDY, GUM OR MINTS AFTER [place] .       Marland Kitchen CLEAR LIQUIDS UNTIL      0700         DAY OF SURGERY.      PLEASE FINISH ENSURE DRINK PER SURGEON ORDER  WHICH NEEDS TO BE COMPLETED AT    0700 am      MORNING OF SURGERY.       CLEAR LIQUID DIET   Foods Allowed      WATER BLACK COFFEE ( SUGAR OK, NO MILK, CREAM OR CREAMER) REGULAR AND DECAF  TEA ( SUGAR OK NO MILK, CREAM, OR CREAMER) REGULAR AND DECAF  PLAIN JELLO ( NO RED)  FRUIT ICES ( NO RED, NO FRUIT PULP)  POPSICLES ( NO RED)  JUICE- APPLE, WHITE GRAPE AND WHITE CRANBERRY  SPORT DRINK LIKE GATORADE ( NO RED)  CLEAR BROTH ( VEGETABLE , CHICKEN OR BEEF)                                                                     BRUSH YOUR TEETH MORNING OF SURGERY AND RINSE YOUR MOUTH OUT, NO CHEWING GUM CANDY OR MINTS.     Take these medicines the morning of surgery with A SIP OF WATER:  synthroid, namenda, metoprolol, omeprazole    DO NOT TAKE ANY DIABETIC MEDICATIONS DAY OF YOUR SURGERY                               You may not have any metal on your body including hair pins and              piercings  Do not wear jewelry, make-up, lotions, powders or perfumes, deodorant             Do not wear nail polish on your fingernails.              IF YOU ARE A FEMALE AND WANT TO SHAVE  UNDER ARMS OR LEGS PRIOR TO SURGERY YOU MUST DO SO AT LEAST 48 HOURS PRIOR TO SURGERY.              Men may shave face and neck.   Do not bring valuables to the hospital. [facility] IS NOT  RESPONSIBLE   FOR VALUABLES.  Contacts, dentures or bridgework may not be worn into surgery.  Leave suitcase in the car. After surgery it may be brought to your room.     Patients discharged the day of surgery will not be allowed to drive home. IF YOU ARE HAVING SURGERY AND GOING HOME THE SAME DAY, YOU MUST HAVE AN ADULT TO DRIVE YOU HOME AND BE WITH YOU FOR 24 HOURS. YOU MAY GO HOME BY TAXI OR UBER OR ORTHERWISE, BUT AN ADULT MUST ACCOMPANY YOU HOME AND STAY WITH YOU FOR 24 HOURS.                Please read over the following fact sheets you were given: _____________________________________________________________________  [facility] - Preparing for Surgery Before surgery, you can play an important role.  Because skin is not sterile, your skin needs to be as free of germs as possible.  You can reduce the number of germs on your skin by washing with CHG (chlorahexidine gluconate) soap before surgery.  CHG is an antiseptic cleaner which kills germs and bonds with the skin to continue killing germs even after washing. Please DO NOT use if you have an allergy to CHG or antibacterial soaps.  If your skin becomes reddened/irritated stop using the CHG and inform your nurse when you arrive at Short Stay. Do not shave (including legs and underarms) for at least 48 hours prior to the first CHG shower.  You may shave your face/neck. Please follow these instructions carefully:  1.  Shower with CHG Soap the night before surgery and the  morning of Surgery.  2.  If you choose to wash your hair, wash your hair first as usual with your  normal  shampoo.  3.  After you shampoo, rinse your hair and body thoroughly to remove the  shampoo.                           4.  Use CHG as you would any other liquid soap.   You can apply chg directly  to the skin and wash                       Gently with a scrungie or clean washcloth.  5.  Apply the CHG Soap to your body ONLY FROM THE NECK DOWN.   Do not use on face/ open                           Wound or open sores. Avoid contact with eyes, ears mouth and genitals (private parts).                       Wash face,  Genitals (private parts) with your normal soap.             6.  Wash thoroughly, paying special attention to the area where your surgery  will be performed.  7.  Thoroughly rinse your body with warm water from the neck down.  8.  DO NOT shower/wash with your normal soap after using and rinsing off  the CHG Soap.                9.  Pat yourself dry with a clean towel.            10.  Wear clean pajamas.  11.  Place clean sheets on your bed the night of your first shower and do not  sleep with pets. Day of Surgery : Do not apply any lotions/deodorants the morning of surgery.  Please wear clean clothes to the hospital/surgery center.  FAILURE TO FOLLOW THESE INSTRUCTIONS MAY RESULT IN THE CANCELLATION OF YOUR SURGERY PATIENT SIGNATURE_________________________________  NURSE SIGNATURE__________________________________  ________________________________________________________________________

## 2022-06-23 NOTE — Progress Notes (Signed)
Anesthesia Review:  PCP: [name]- [facility] 06/18/22  Cardiologist : Chest x-ray : 03/03/22- 1view  EKG : 03/04/22  Echo :2021  Stress test:2021  Cardiac Cath :  Activity level:  Sleep Study/ CPAP : Fasting Blood Sugar :      / Checks Blood Sugar -- times a day:   Blood Thinner/ Instructions /Last Dose: ASA / Instructions/ Last Dose :

## 2022-06-24 DIAGNOSIS — M6281 Muscle weakness (generalized): Secondary | ICD-10-CM | POA: Diagnosis not present

## 2022-06-24 DIAGNOSIS — R262 Difficulty in walking, not elsewhere classified: Secondary | ICD-10-CM | POA: Diagnosis not present

## 2022-06-24 DIAGNOSIS — M25562 Pain in left knee: Secondary | ICD-10-CM | POA: Diagnosis not present

## 2022-06-24 DIAGNOSIS — M1712 Unilateral primary osteoarthritis, left knee: Secondary | ICD-10-CM | POA: Diagnosis not present

## 2022-06-25 ENCOUNTER — Encounter (HOSPITAL_COMMUNITY)
Admission: RE | Admit: 2022-06-25 | Discharge: 2022-06-25 | Disposition: A | Discharge status: Home / Self Care | Payer: Medicare HMO | Source: Ambulatory Visit | Attending: Orthopedic Surgery | Admitting: Orthopedic Surgery

## 2022-06-25 ENCOUNTER — Encounter (HOSPITAL_COMMUNITY): Payer: Self-pay

## 2022-06-25 ENCOUNTER — Other Ambulatory Visit: Payer: Self-pay

## 2022-06-25 VITALS — BP 115/61 | HR 67 | Temp 98.3°F | Resp 16 | Ht 62.0 in

## 2022-06-25 DIAGNOSIS — M1712 Unilateral primary osteoarthritis, left knee: Secondary | ICD-10-CM | POA: Insufficient documentation

## 2022-06-25 DIAGNOSIS — Z79899 Other long term (current) drug therapy: Secondary | ICD-10-CM | POA: Diagnosis not present

## 2022-06-25 DIAGNOSIS — Z01812 Encounter for preprocedural laboratory examination: Secondary | ICD-10-CM | POA: Insufficient documentation

## 2022-06-25 DIAGNOSIS — Z01818 Encounter for other preprocedural examination: Secondary | ICD-10-CM

## 2022-06-25 HISTORY — DX: Gastro-esophageal reflux disease without esophagitis: K21.9

## 2022-06-25 HISTORY — DX: Cardiac murmur, unspecified: R01.1

## 2022-06-25 HISTORY — DX: Hypothyroidism, unspecified: E03.9

## 2022-06-25 HISTORY — DX: Dyspnea, unspecified: R06.00

## 2022-06-25 HISTORY — DX: Headache, unspecified: R51.9

## 2022-06-25 HISTORY — DX: Other specified postprocedural states: R11.2

## 2022-06-25 HISTORY — DX: Depression, unspecified: F32.A

## 2022-06-25 HISTORY — DX: Pneumonia, unspecified organism: J18.9

## 2022-06-25 HISTORY — DX: Other specified postprocedural states: Z98.890

## 2022-06-25 LAB — BASIC METABOLIC PANEL
Anion gap: 10 (ref 5–15)
BUN: 22 mg/dL (ref 8–23)
CO2: 26 mmol/L (ref 22–32)
Calcium: 9.6 mg/dL (ref 8.9–10.3)
Chloride: 102 mmol/L (ref 98–111)
Creatinine, Ser: 0.59 mg/dL (ref 0.44–1.00)
GFR, Estimated: >60 mL/min (ref >60)
Glucose, Bld: 97 mg/dL (ref 70–99)
Potassium: 3.9 mmol/L (ref 3.5–5.1)
Sodium: 138 mmol/L (ref 135–145)

## 2022-06-25 LAB — CBC
HCT: 41.4 % (ref 36.0–46.0)
Hemoglobin: 13.7 g/dL (ref 12.0–15.0)
MCH: 32.2 pg (ref 26.0–34.0)
MCHC: 33.1 g/dL (ref 30.0–36.0)
MCV: 97.4 fL (ref 80.0–100.0)
Platelets: 210 10*3/uL (ref 150–400)
RBC: 4.25 MIL/uL (ref 3.87–5.11)
RDW: 13.2 % (ref 11.5–15.5)
WBC: 6.6 10*3/uL (ref 4.0–10.5)
nRBC: 0 % (ref 0.0–0.2)

## 2022-06-25 LAB — SURGICAL PCR SCREEN
MRSA, PCR: NEGATIVE
Staphylococcus aureus: NEGATIVE

## 2022-06-25 LAB — TYPE AND SCREEN
ABO/RH(D): A POS
Antibody Screen: NEGATIVE

## 2022-06-26 DIAGNOSIS — R262 Difficulty in walking, not elsewhere classified: Secondary | ICD-10-CM | POA: Diagnosis not present

## 2022-06-26 DIAGNOSIS — M6281 Muscle weakness (generalized): Secondary | ICD-10-CM | POA: Diagnosis not present

## 2022-06-26 DIAGNOSIS — M25562 Pain in left knee: Secondary | ICD-10-CM | POA: Diagnosis not present

## 2022-06-26 DIAGNOSIS — M1712 Unilateral primary osteoarthritis, left knee: Secondary | ICD-10-CM | POA: Diagnosis not present

## 2022-06-26 DIAGNOSIS — M25511 Pain in right shoulder: Secondary | ICD-10-CM | POA: Diagnosis not present

## 2022-06-27 DIAGNOSIS — M25511 Pain in right shoulder: Secondary | ICD-10-CM | POA: Diagnosis not present

## 2022-06-27 DIAGNOSIS — M6281 Muscle weakness (generalized): Secondary | ICD-10-CM | POA: Diagnosis not present

## 2022-06-30 ENCOUNTER — Inpatient Hospital Stay: Payer: Medicare HMO | Attending: Hematology and Oncology | Admitting: Hematology and Oncology

## 2022-06-30 VITALS — BP 129/54 | HR 68 | Temp 97.9°F | Resp 17 | Ht 62.0 in | Wt 165.0 lb

## 2022-06-30 DIAGNOSIS — Z170 Estrogen receptor positive status [ER+]: Secondary | ICD-10-CM | POA: Diagnosis not present

## 2022-06-30 DIAGNOSIS — Z79890 Hormone replacement therapy (postmenopausal): Secondary | ICD-10-CM | POA: Insufficient documentation

## 2022-06-30 DIAGNOSIS — E2740 Unspecified adrenocortical insufficiency: Secondary | ICD-10-CM | POA: Diagnosis not present

## 2022-06-30 DIAGNOSIS — M1990 Unspecified osteoarthritis, unspecified site: Secondary | ICD-10-CM | POA: Insufficient documentation

## 2022-06-30 DIAGNOSIS — E785 Hyperlipidemia, unspecified: Secondary | ICD-10-CM | POA: Diagnosis not present

## 2022-06-30 DIAGNOSIS — E039 Hypothyroidism, unspecified: Secondary | ICD-10-CM | POA: Insufficient documentation

## 2022-06-30 DIAGNOSIS — Z801 Family history of malignant neoplasm of trachea, bronchus and lung: Secondary | ICD-10-CM | POA: Insufficient documentation

## 2022-06-30 DIAGNOSIS — Z79899 Other long term (current) drug therapy: Secondary | ICD-10-CM | POA: Diagnosis not present

## 2022-06-30 DIAGNOSIS — K219 Gastro-esophageal reflux disease without esophagitis: Secondary | ICD-10-CM | POA: Diagnosis not present

## 2022-06-30 DIAGNOSIS — Z87891 Personal history of nicotine dependence: Secondary | ICD-10-CM | POA: Diagnosis not present

## 2022-06-30 DIAGNOSIS — I10 Essential (primary) hypertension: Secondary | ICD-10-CM | POA: Diagnosis not present

## 2022-06-30 DIAGNOSIS — R011 Cardiac murmur, unspecified: Secondary | ICD-10-CM | POA: Insufficient documentation

## 2022-06-30 DIAGNOSIS — C50811 Malignant neoplasm of overlapping sites of right female breast: Secondary | ICD-10-CM | POA: Diagnosis not present

## 2022-06-30 NOTE — Progress Notes (Signed)
[place]  Telephone:(336) [phone] Fax:(336) [phone]    ID: [name] DOB: [birth year]  MR#: [id number]  OIB#:[id number]  Patient Care Team: Amy Ferretti, MD as PCP - General (Internal Medicine) Amy Partridge, DO as Consulting Physician (Neurology) Amy Bern, MD as Consulting Physician (Obstetrics and Gynecology) Roberts, Amy Dad, MD (Inactive) as Consulting Physician (Oncology) Amy Latch, MD as Attending Physician (Cardiology) Amy Horner, MD as Consulting Physician (Gastroenterology) Amy May, MD as Consulting Physician (Psychiatry) Amy Maudlin, MD as Consulting Physician (Orthopedic Surgery) Amy Bookbinder, MD as Consulting Physician (Dermatology) [name], RT (Inactive) as Technician (Radiology) Amy Pike, MD OTHER MD:  CHIEF COMPLAINT: noninvasive breast cancer, estrogen receptor positive  CURRENT TREATMENT: Observation   INTERVAL HISTORY:  [name] was contacted today for follow up of her noninvasive breast cancer.  She opted for observation alone (no surgery, radiation or anti-estrogens). Since her last visit, she underwent left diagnostic mammography in April 2023, stable appearance of biopsy-proven DCIS calcifications in the left breast upper outer quadrant. She is anticipating knee replacement next week, has severe left knee pain.  She otherwise denies any complaints today Rest of the pertinent 10 point ROS reviewed and negative  REVIEW OF SYSTEMS: [name] just had some friends over for a lunch and enjoyed that. She fell recently but is otherwise steady. A detailed review of systems was otherwise stable.   COVID 19 VACCINATION STATUS: Moderna x2 with booster in July.   RIGHT BREAST CANCER HISTORY: From the original intake note:   [name] had routine screening mammography at the [facility] on September 06, 2009. There was a possible abnormality in the upper outer quadrant of the right breast so she  was brought back for diagnostic studies September 12, 2009.  Magnification views confirmed a 1.4 spiculated mass in the upper outer quadrant of the right breast with a few microcalcifications associated with it.  By ultrasound, this measured 1.5 cm and was highly suggestive of malignancy.  Biopsy was performed the same day and showed ([id number]) an invasive lobular carcinoma which was 92% ER and 44% PR positive.  The proliferation marker was 13%.  The tumor did not overexpress Her-2 with a ratio of 1.31.     With this information, the patient was referred to Dr. Brantley Roberts and after appropriate discussion, she underwent definitive right lumpectomy and axillary sentinel lymph node sampling October 17, 2009.  The final pathology there ([id number]) showed a 1.5 cm invasive lobular carcinoma, grade 1, with negative though closed margins (the in situ component was at 1 mm from the superior, inferior and medial margins) with no evidence of lymphovascular invasion and the single sentinel lymph node clear.  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     HISTORY OF CURRENT ILLNESS: [name] has a history of right breast invasive lobular carcinoma, for which she underwent lumpectomy and radiation in 10/2009.  She did not receive chemo and she did not tolerate antiestrogens.  I followed the patient, and she was subsequently released from follow up in 05/2014. See full history below.  More recently, she had routine screening mammography on 11/29/2020 showing a possible abnormality in the left breast. She underwent left diagnostic mammography with tomography at [facility] on 12/06/2020 showing: breast density category B; 1 cm grouped fine punctate calcifications in upper-outer left breast.  Accordingly on 12/20/2020 she proceeded to biopsy of the left breast area in question.  Post procedure mammography showed the clip at the  targeted area.  The pathology from this procedure ([id number]) showed: ductal carcinoma in  situ, low grade, with calcifications. Prognostic indicators significant for: estrogen receptor, >95% positive with strong staining intensity and progesterone receptor, 90% positive with moderate-strong staining intensity.   Cancer Staging  Ductal carcinoma in situ (DCIS) of left breast Staging form: Breast, AJCC 8th Edition - Clinical: [place] 0 (cTis (DCIS), cN0, cM0, ER+, PR+) - Signed by Amy Cruel, MD on 01/01/2021 Nuclear grade: G1  Malignant neoplasm of overlapping sites of right breast in female, estrogen receptor positive ([place]) Staging form: Breast, AJCC 8th Edition - Clinical: No [place] Recommended (ycT1c, cN0, cM0, G1, ER+, PR+, HER2-) - Signed by Amy Cruel, MD on 01/01/2021 [place] prefix: Post-therapy    The patient's subsequent history is as detailed below.   PAST MEDICAL HISTORY: Past Medical History:  Diagnosis Date   Adrenal insufficiency ([place]) 07/25/2021   Anxiety    Arrhythmia    right bundle branch block   Bipolar 1 disorder (HCC)    Cancer ([place])    right breast cancer   Depression    Dyspnea    with exertion   GERD (gastroesophageal reflux disease)    Headache    Heart murmur    Hyperlipidemia    Hypertension    Hypothyroidism    Morbid obesity (HCC)    Osteoarthritis    Pneumonia    PONV (postoperative nausea and vomiting)    Schizo-affective schizophrenia ([place])    Thyroid disease    hypothyroidism  History of: dilated cardiomyopathy, bundle branch block, heart murmur, rosacea, psychiatric hospitalization in 1999 for bipolar disorder, chronic schizophrenia, and early Alzheimer's disease   PAST SURGICAL HISTORY: Past Surgical History:  Procedure Laterality Date   BREAST SURGERY     CHOLECYSTECTOMY     DILATION AND CURETTAGE OF UTERUS     ESOPHAGEAL MANOMETRY N/A 01/29/2018   Procedure: ESOPHAGEAL MANOMETRY (EM);  Surgeon: Amy Horner, MD;  Location: [facility] ENDOSCOPY;  Service: Endoscopy;  Laterality: N/A;   HAND SURGERY      Right-trigger finger   IR THORACENTESIS ASP PLEURAL SPACE W/IMG GUIDE  06/28/2018   IR THORACENTESIS ASP PLEURAL SPACE W/IMG GUIDE  07/19/2018   KNEE SURGERY     Left   TONSILLECTOMY      FAMILY HISTORY: Family History  Problem Relation Age of Onset   Hypertension Mother    Lung cancer Brother    Schizophrenia Brother    The patient's mother died from pneumonia at the age of 35 in the setting of Parkinson's disease.  The patient's father died at the age of 73.  The patient's only sibling was a brother who died from lung cancer in his fifties.    She is not aware of any breast ovarian or prostate cancer in the family   GYNECOLOGIC HISTORY:  No LMP recorded. Patient is postmenopausal. Menarche: 81 years old [place] P 0 LMP ~age 31 Contraceptive HRT used for about 2 years  Hysterectomy? no BSO? 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  She is now retired.  She has lived in [place] about 30 years.  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  She is divorced. She lives in [place].  She attends [employer]    ADVANCED DIRECTIVES:  The patient has named Verne Grain as her healthcare power of attorney   HEALTH MAINTENANCE: Social History   Tobacco Use   Smoking status: Former    Types: Cigarettes    Quit date: 12/08/1978    Years since quitting: 43.5   Smokeless tobacco: Never  Vaping Use   Vaping Use: Never used  Substance Use Topics   Alcohol use: Never   Drug use: No      Allergies  Allergen Reactions   Lithium Nausea Only   Fluoxetine Other (See Comments)    Pt felt crazy   Macrodantin Other (See Comments)    Unknown reaction   Mirabegron Other (See Comments)    ineffective   Nitrofurantoin Other (See Comments)    Other reaction(s): Did not agree with  me   Paxil [Paroxetine] Other (See Comments)    Made pt feel crazy    Prednisone Other (See Comments)    Makes her feel very jittery   Prozac [Fluoxetine Hcl] Other (See Comments)    Pt felt crazy    Sertraline Hcl Other (See Comments)    Unknown reaction   Solifenacin Other (See Comments)    Ineffective    Sulfa Antibiotics Other (See Comments)    Unknown reaction   Wellbutrin [Bupropion Hcl] Other (See Comments)    Unknown reaction    Current Outpatient Medications  Medication Sig Dispense Refill   albuterol (VENTOLIN HFA) 108 (90 Base) MCG/ACT inhaler Inhale 2 puffs into the lungs every 6 (six) hours as needed for wheezing or shortness of breath. (Patient not taking: Reported on 03/04/2022) 8 g 2   amLODipine (NORVASC) 2.5 MG tablet Take 2.5 mg by mouth at bedtime.     Ascorbic Acid (VITAMIN C PO) Take 500 mg by mouth every morning.     atorvastatin (LIPITOR) 40 MG tablet Take 40 mg by mouth at bedtime.     Biotin 10 MG TABS Take 10,000 mcg by mouth every morning.     Calcium Carbonate Antacid (CALCIUM CARBONATE PO) Take 600 mg by mouth every morning.     Cholecalciferol (VITAMIN D) 50 MCG (2000 UT) tablet Take 2,000 Units by mouth daily.     clonazePAM (KLONOPIN) 1 MG tablet Take 1 mg by mouth at bedtime.     co-enzyme Q-10 30 MG capsule Take 30 mg by mouth daily.     Cyanocobalamin (VITAMIN B-12 PO) Take 2,500 mcg by mouth every morning.     gabapentin (NEURONTIN) 100 MG capsule TAKE 2 CAPSULES BY MOUTH AT BEDTIME (Patient taking differently: 200 mg 3 (three) times daily.) 60 capsule 6   hydrocortisone (CORTEF) 5 MG tablet Take 1 tablet (5 mg total) by mouth as directed. 2 tabs with breakfast, and 1 tablet in the afternoon (Patient taking differently: Take 10 mg by mouth daily.) 300 tablet 1   ibuprofen (ADVIL) 200 MG tablet Take 400 mg by mouth in the morning and at bedtime.     lamoTRIgine (LAMICTAL) 200 MG tablet Take 200 mg by mouth at bedtime.  12   levothyroxine  (SYNTHROID) 125 MCG tablet Take 1 tablet (125 mcg total) by mouth as directed. 1.5 tablet on Sundays, 1 tablet Monday through Saturday (Patient taking differently: Take 125 mcg by mouth daily before breakfast.) 98 tablet 2   loperamide (IMODIUM) 2 MG capsule Take 1 capsule (2 mg total) by mouth as needed for diarrhea or loose stools (If >6 episodes of diarrhea over 24 hrs.    Maximum daily dose =  16 mg). (Patient not taking: Reported on 03/04/2022) 15 capsule 0   memantine (NAMENDA) 10 MG tablet Take 10 mg by mouth 2 (two) times daily.     metoprolol tartrate (LOPRESSOR) 25 MG tablet Take 25 mg by mouth 2 (two) times daily.     Multiple Vitamin (MULTIVITAMIN WITH MINERALS) TABS tablet Take 1 tablet by mouth every morning.     OLANZapine (ZYPREXA) 15 MG tablet Take 30 mg by mouth at bedtime.     Omega-3 Fatty Acids (FISH OIL) 1200 MG CAPS Take 1,200 mg by mouth every morning.     omeprazole (PRILOSEC) 40 MG capsule Take 40 mg by mouth in the morning.     polyvinyl alcohol (LIQUIFILM TEARS) 1.4 % ophthalmic solution Place 1 drop into both eyes as needed for dry eyes.     ramelteon (ROZEREM) 8 MG tablet Take 8 mg by mouth at bedtime.     rosuvastatin (CRESTOR) 20 MG tablet Take 1 tablet (20 mg total) by mouth daily. Need labs from PCP (Patient not taking: Reported on 06/19/2022) 90 tablet 1   spironolactone (ALDACTONE) 25 MG tablet TAKE 1 TABLET (25 MG TOTAL) BY MOUTH DAILY. 90 tablet 0   traZODone (DESYREL) 50 MG tablet Take 150 mg by mouth at bedtime.     No current facility-administered medications for this visit.    OBJECTIVE:   Vitals:   06/30/22 1139  BP: (!) 129/54  Pulse: 68  Resp: 17  Temp: 97.9 F (36.6 C)  SpO2: 98%     Body mass index is 30.18 kg/m.   Wt Readings from Last 3 Encounters:  06/30/22 165 lb (74.8 kg)  03/03/22 166 lb 10.7 oz (75.6 kg)  03/02/22 166 lb 10.7 oz (75.6 kg)     ECOG FS:2 - Symptomatic, <50% confined to bed  Physical Exam Constitutional:       Appearance: Normal appearance.  Cardiovascular:     Rate and Rhythm: Normal rate and regular rhythm.  Chest:     Comments: Bilateral breasts inspected.  Right breast status postlumpectomy, surgical scarring noted.  No large palpable masses or regional adenopathy Musculoskeletal:     Cervical back: Normal range of motion and neck supple. No rigidity.  Lymphadenopathy:     Cervical: No cervical adenopathy.  Neurological:     Mental Status: She is alert.      LAB RESULTS:  CMP     Component Value Date/Time   NA 138 06/25/2022 1002   NA 142 05/11/2014 0911   K 3.9 06/25/2022 1002   K 4.1 05/11/2014 0911   CL 102 06/25/2022 1002   CL 105 05/12/2013 1452   CO2 26 06/25/2022 1002   CO2 19 (L) 05/11/2014 0911   GLUCOSE 97 06/25/2022 1002   GLUCOSE 106 05/11/2014 0911   GLUCOSE 100 (H) 05/12/2013 1452   BUN 22 06/25/2022 1002   BUN 14.9 05/11/2014 0911   CREATININE 0.59 06/25/2022 1002   CREATININE 0.79 01/01/2021 1508   CREATININE 0.89 10/10/2015 0933   CREATININE 0.9 05/11/2014 0911   CALCIUM 9.6 06/25/2022 1002   CALCIUM 9.9 05/11/2014 0911   PROT 6.7 03/03/2022 1310   PROT 6.8 05/11/2014 0911   ALBUMIN 3.5 03/03/2022 1310   ALBUMIN 3.9 05/11/2014 0911   AST 18 03/03/2022 1310   AST 18 01/01/2021 1508   AST 37 (H) 05/11/2014 0911   ALT 23 03/03/2022 1310   ALT 33 01/01/2021 1508   ALT 40 05/11/2014 0911   ALKPHOS 42 03/03/2022  1310   ALKPHOS 52 05/11/2014 0911   BILITOT 0.4 03/03/2022 1310   BILITOT 0.3 01/01/2021 1508   BILITOT 0.33 05/11/2014 0911   GFRNONAA >60 06/25/2022 1002   GFRNONAA >60 01/01/2021 1508   GFRAA >60 08/20/2020 0333    No results found for: "TOTALPROTELP", "ALBUMINELP", "A1GS", "A2GS", "BETS", "BETA2SER", "GAMS", "MSPIKE", "SPEI"  Lab Results  Component Value Date   WBC 6.6 06/25/2022   NEUTROABS 2.6 03/02/2022   HGB 13.7 06/25/2022   HCT 41.4 06/25/2022   MCV 97.4 06/25/2022   PLT 210 06/25/2022    Lab Results  Component Value  Date   LABCA2 12 04/01/2012    No components found for: "AQTMAU633"  No results for input(s): "INR" in the last 168 hours.  Lab Results  Component Value Date   LABCA2 12 04/01/2012    No results found for: "CAN199"  No results found for: "CAN125"  No results found for: "CAN153"  No results found for: "CA2729"  No components found for: "HGQUANT"  No results found for: "CEA1", "CEA" / No results found for: "CEA1", "CEA"   No results found for: "AFPTUMOR"  No results found for: "CHROMOGRNA"  No results found for: "KPAFRELGTCHN", "LAMBDASER", "KAPLAMBRATIO" (kappa/lambda light chains)  No results found for: "HGBA", "HGBA2QUANT", "HGBFQUANT", "HGBSQUAN" (Hemoglobinopathy evaluation)   Lab Results  Component Value Date   LDH 207 (H) 08/17/2020    No results found for: "IRON", "TIBC", "IRONPCTSAT" (Iron and TIBC)  Lab Results  Component Value Date   FERRITIN 97 08/17/2020    Urinalysis    Component Value Date/Time   COLORURINE STRAW (A) 03/03/2022 1310   APPEARANCEUR CLEAR 03/03/2022 1310   LABSPEC 1.006 03/03/2022 1310   PHURINE 8.0 03/03/2022 1310   GLUCOSEU NEGATIVE 03/03/2022 1310   HGBUR NEGATIVE 03/03/2022 1310   BILIRUBINUR NEGATIVE 03/03/2022 1310   KETONESUR NEGATIVE 03/03/2022 1310   PROTEINUR NEGATIVE 03/03/2022 1310   NITRITE NEGATIVE 03/03/2022 1310   LEUKOCYTESUR NEGATIVE 03/03/2022 1310    STUDIES: No results found.   ELIGIBLE FOR AVAILABLE RESEARCH PROTOCOL: no  ASSESSMENT: 81 y.o. [place] woman with  RIGHT BREAST CANCER:  (1) status post right lumpectomy and sentinel lymph node sampling in November 2010 for a T1cN0, [place] invasive lobular carcinoma, grade 1, strongly estrogen and progesterone receptor positive, HER-2/neu negative, with low MIB-1.     (2) Status post ADJUVANT radiation therapy, completed in February 2011.   (3) Did not tolerate aromatase anastrozole or letrozole and WAs not felt to be a candidate for  tamoxifen--opted for observation alone and was discharged from follow-up 2015  LEFT BREAST CANCER (4) status post left breast biopsy 12/20/2020 for a clinically 1 cm ductal carcinoma in situ, grade 1, strongly estrogen and progesterone receptor positive  (5) refused standard of care lumpectomy, opted for observation alone  (6) she will have bi-annual mammograms in [place] and November indefinitely   PLAN:  She is doing well at this time, anticipating knee replacement next week. Most recent mammogram from March 27, 2022 with stable appearance of biopsy-proven DCIS calcifications in the left breast upper outer quadrant. No concerns on physical examination today, no large palpable masses or lymphadenopathy noted. Repeat mammogram in 6 months she gets mammograms every [place] and November. She is anticipating knee replacement next week, no concerns from Hem onc stand point RTC in 6 months.  Total time spent: 30 minutes.  *Total Encounter Time as defined by the Centers for Medicare and Medicaid Services includes, in addition to the  face-to-face time of a patient visit (documented in the note above) non-face-to-face time: obtaining and reviewing outside history, ordering and reviewing medications, tests or procedures, care coordination (communications with other health care professionals or caregivers) and documentation in the medical record.

## 2022-07-01 DIAGNOSIS — M6281 Muscle weakness (generalized): Secondary | ICD-10-CM | POA: Diagnosis not present

## 2022-07-01 DIAGNOSIS — R262 Difficulty in walking, not elsewhere classified: Secondary | ICD-10-CM | POA: Diagnosis not present

## 2022-07-01 DIAGNOSIS — M25562 Pain in left knee: Secondary | ICD-10-CM | POA: Diagnosis not present

## 2022-07-01 DIAGNOSIS — M1712 Unilateral primary osteoarthritis, left knee: Secondary | ICD-10-CM | POA: Diagnosis not present

## 2022-07-03 DIAGNOSIS — M6281 Muscle weakness (generalized): Secondary | ICD-10-CM | POA: Diagnosis not present

## 2022-07-03 DIAGNOSIS — M25562 Pain in left knee: Secondary | ICD-10-CM | POA: Diagnosis not present

## 2022-07-03 DIAGNOSIS — R262 Difficulty in walking, not elsewhere classified: Secondary | ICD-10-CM | POA: Diagnosis not present

## 2022-07-03 DIAGNOSIS — M1712 Unilateral primary osteoarthritis, left knee: Secondary | ICD-10-CM | POA: Diagnosis not present

## 2022-07-07 NOTE — H&P (Signed)
TOTAL KNEE ADMISSION H&P  Patient is being admitted for left total knee arthroplasty.  Subjective:  Chief Complaint:left knee pain.  HPI: [name], 81 y.o. female, has a history of pain and functional disability in the left knee due to arthritis and has failed non-surgical conservative treatments for greater than 12 weeks to includeNSAID's and/or analgesics and activity modification.  Onset of symptoms was gradual, starting 2 years ago with gradually worsening course since that time. The patient noted prior procedures on the knee to include  arthroscopy and menisectomy on the left knee(s).  Patient currently rates pain in the left knee(s) at 7 out of 10 with activity. Patient has worsening of pain with activity and weight bearing and pain that interferes with activities of daily living.  Patient has evidence of joint space narrowing by imaging studies. There is no active infection.  Patient Active Problem List   Diagnosis Date Noted   Generalized weakness 02/28/2022   AKI (acute kidney injury) ([place]) 02/27/2022   Adrenal insufficiency ([place]) 07/25/2021   Malignant neoplasm of overlapping sites of right breast in female, estrogen receptor positive ([place]) 01/01/2021   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   Community acquired pneumonia of both lower lobes 08/18/2020   NSTEMI (non-ST elevated myocardial infarction) ([place]) 08/17/2020   Fever    Troponin level elevated    Sepsis ([place]) 02/13/2019   HLD (hyperlipidemia) 02/13/2019   Anxiety 02/13/2019   Chronic diastolic CHF (congestive heart failure) ([place]) 02/13/2019   GERD (gastroesophageal reflux disease) 02/13/2019   Nausea vomiting and diarrhea 02/13/2019   Schizo-affective schizophrenia ([place])    Bipolar 1 disorder (HCC)    Pleural effusion on right 06/28/2018   Osteoarthritis of left knee 11/09/2014   Nocturia more than twice per night 11/09/2014   Right bundle branch block 02/23/2013   Irritable bowel syndrome  with constipation and diarrhea 10/28/2012   Dermatitis 12/24/2011   Hypercholesterolemia 04/30/2011   Hypothyroidism 04/30/2011   Dementia ([place]) 04/30/2011   HTN (hypertension) 04/30/2011   Past Medical History:  Diagnosis Date   Adrenal insufficiency ([place]) 07/25/2021   Anxiety    Arrhythmia    right bundle branch block   Bipolar 1 disorder ([place])    Cancer ([place])    right breast cancer   Depression    Dyspnea    with exertion   GERD (gastroesophageal reflux disease)    Headache    Heart murmur    Hyperlipidemia    Hypertension    Hypothyroidism    Morbid obesity ([place])    Osteoarthritis    Pneumonia    PONV (postoperative nausea and vomiting)    Schizo-affective schizophrenia ([place])    Thyroid disease    hypothyroidism    Past Surgical History:  Procedure Laterality Date   BREAST SURGERY     CHOLECYSTECTOMY     DILATION AND CURETTAGE OF UTERUS     ESOPHAGEAL MANOMETRY N/A 01/29/2018   Procedure: ESOPHAGEAL MANOMETRY (EM);  Surgeon: Wonda Horner, MD;  Location: [facility];  Service: Endoscopy;  Laterality: N/A;   HAND SURGERY     Right-trigger finger   IR THORACENTESIS ASP PLEURAL SPACE W/IMG GUIDE  06/28/2018   IR THORACENTESIS ASP PLEURAL SPACE W/IMG GUIDE  07/19/2018   KNEE SURGERY     Left   TONSILLECTOMY      No current facility-administered medications for this encounter.   Current Outpatient Medications  Medication Sig Dispense Refill Last Dose   amLODipine (NORVASC) 2.5  MG tablet Take 2.5 mg by mouth at bedtime.      Ascorbic Acid (VITAMIN C PO) Take 500 mg by mouth every morning.      atorvastatin (LIPITOR) 40 MG tablet Take 40 mg by mouth at bedtime.      Biotin 10 MG TABS Take 10,000 mcg by mouth every morning.      Calcium Carbonate Antacid (CALCIUM CARBONATE PO) Take 600 mg by mouth every morning.      Cholecalciferol (VITAMIN D) 50 MCG (2000 UT) tablet Take 2,000 Units by mouth daily.      clonazePAM (KLONOPIN) 1 MG tablet Take 1 mg by mouth at  bedtime.      co-enzyme Q-10 30 MG capsule Take 30 mg by mouth daily.      Cyanocobalamin (VITAMIN B-12 PO) Take 2,500 mcg by mouth every morning.      gabapentin (NEURONTIN) 100 MG capsule TAKE 2 CAPSULES BY MOUTH AT BEDTIME (Patient taking differently: 200 mg 3 (three) times daily.) 60 capsule 6    hydrocortisone (CORTEF) 5 MG tablet Take 1 tablet (5 mg total) by mouth as directed. 2 tabs with breakfast, and 1 tablet in the afternoon (Patient taking differently: Take 10 mg by mouth daily.) 300 tablet 1    ibuprofen (ADVIL) 200 MG tablet Take 400 mg by mouth in the morning and at bedtime.      lamoTRIgine (LAMICTAL) 200 MG tablet Take 200 mg by mouth at bedtime.  12    levothyroxine (SYNTHROID) 125 MCG tablet Take 1 tablet (125 mcg total) by mouth as directed. 1.5 tablet on Sundays, 1 tablet Monday through Saturday (Patient taking differently: Take 125 mcg by mouth daily before breakfast.) 98 tablet 2    memantine (NAMENDA) 10 MG tablet Take 10 mg by mouth 2 (two) times daily.      metoprolol tartrate (LOPRESSOR) 25 MG tablet Take 25 mg by mouth 2 (two) times daily.      Multiple Vitamin (MULTIVITAMIN WITH MINERALS) TABS tablet Take 1 tablet by mouth every morning.      OLANZapine (ZYPREXA) 15 MG tablet Take 30 mg by mouth at bedtime.      Omega-3 Fatty Acids (FISH OIL) 1200 MG CAPS Take 1,200 mg by mouth every morning.      omeprazole (PRILOSEC) 40 MG capsule Take 40 mg by mouth in the morning.      polyvinyl alcohol (LIQUIFILM TEARS) 1.4 % ophthalmic solution Place 1 drop into both eyes as needed for dry eyes.      ramelteon (ROZEREM) 8 MG tablet Take 8 mg by mouth at bedtime.      spironolactone (ALDACTONE) 25 MG tablet TAKE 1 TABLET (25 MG TOTAL) BY MOUTH DAILY. 90 tablet 0    traZODone (DESYREL) 50 MG tablet Take 150 mg by mouth at bedtime.      albuterol (VENTOLIN HFA) 108 (90 Base) MCG/ACT inhaler Inhale 2 puffs into the lungs every 6 (six) hours as needed for wheezing or shortness of  breath. (Patient not taking: Reported on 03/04/2022) 8 g 2    loperamide (IMODIUM) 2 MG capsule Take 1 capsule (2 mg total) by mouth as needed for diarrhea or loose stools (If >6 episodes of diarrhea over 24 hrs.    Maximum daily dose = 16 mg). (Patient not taking: Reported on 03/04/2022) 15 capsule 0    rosuvastatin (CRESTOR) 20 MG tablet Take 1 tablet (20 mg total) by mouth daily. Need labs from PCP (Patient not taking: Reported on 06/19/2022)  90 tablet 1 Not Taking   Allergies  Allergen Reactions   Lithium Nausea Only   Fluoxetine Other (See Comments)    Pt felt crazy   Macrodantin Other (See Comments)    Unknown reaction   Mirabegron Other (See Comments)    ineffective   Nitrofurantoin Other (See Comments)    Other reaction(s): Did not agree with me   Paxil [Paroxetine] Other (See Comments)    Made pt feel crazy    Prednisone Other (See Comments)    Makes her feel very jittery   Prozac [Fluoxetine Hcl] Other (See Comments)    Pt felt crazy    Sertraline Hcl Other (See Comments)    Unknown reaction   Solifenacin Other (See Comments)    Ineffective    Sulfa Antibiotics Other (See Comments)    Unknown reaction   Wellbutrin [Bupropion Hcl] Other (See Comments)    Unknown reaction    Social History   Tobacco Use   Smoking status: Former    Types: Cigarettes    Quit date: 12/08/1978    Years since quitting: 43.6   Smokeless tobacco: Never  Substance Use Topics   Alcohol use: Never    Family History  Problem Relation Age of Onset   Hypertension Mother    Lung cancer Brother    Schizophrenia Brother      Review of Systems  Constitutional:  Negative for chills and fever.  Respiratory:  Negative for cough and shortness of breath.   Cardiovascular:  Negative for chest pain.  Gastrointestinal:  Negative for nausea and vomiting.  Musculoskeletal:  Positive for arthralgias.     Objective:  Physical Exam Well nourished and well developed. General: Alert and oriented x3,  cooperative and pleasant, no acute distress. Head: normocephalic, atraumatic, neck supple. Eyes: EOMI.  Musculoskeletal: Left knee exam: Slight genu varum noted with slight flexion contracture Flexion to 120 degrees with terminal tightness No significant knee effusion, warmth or erythema No significant lower extremity edema or erythema   Calves soft and nontender. Motor function intact in LE. Strength 5/5 LE bilaterally. Neuro: Distal pulses 2+. Sensation to light touch intact in LE.  Vital signs in last 24 hours:    Labs:   Estimated body mass index is 30.18 kg/m as calculated from the following:   Height as of 06/30/22: '5\' 2"'$  (1.575 m).   Weight as of 06/30/22: 74.8 kg.   Imaging Review Plain radiographs demonstrate severe degenerative joint disease of the left knee(s). The overall alignment isneutral. The bone quality appears to be adequate for age and reported activity level.      Assessment/Plan:  End stage arthritis, left knee   The patient history, physical examination, clinical judgment of the provider and imaging studies are consistent with end stage degenerative joint disease of the left knee(s) and total knee arthroplasty is deemed medically necessary. The treatment options including medical management, injection therapy arthroscopy and arthroplasty were discussed at length. The risks and benefits of total knee arthroplasty were presented and reviewed. The risks due to aseptic loosening, infection, stiffness, patella tracking problems, thromboembolic complications and other imponderables were discussed. The patient acknowledged the explanation, agreed to proceed with the plan and consent was signed. Patient is being admitted for inpatient treatment for surgery, pain control, PT, OT, prophylactic antibiotics, VTE prophylaxis, progressive ambulation and ADL's and discharge planning. The patient is planning to be discharged  home.  Therapy Plans: Home with HHPT x 1  week Disposition: [place] at [facility]  DVT Prophylaxis: aspirin '81mg'$  BID DME needed: walker PCP: Dr. Felipa Eth, clearance received Psychiatrist; Dr. Toy Care Endocrinologist: Dr. Kelton Pillar TXA: IV Allergies: lithium - nausea, paxil - didn't work well, prozac - flushing, sulfa drugs - rash, wellbutrin - dizziness Anesthesia Concerns: none BMI: 30.2 Last HgbA1c:   Other: - Hx of adrenal insufficiency - instructed to take hydorcortisone by endocrinology - CKD III - no NSAIDs - [facility] hospitalization in March/April 2023 - Schizoaffective disorder/BPD & anixety - oxycodone, robaxin, tylenol, celebrex? 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 - Needs arrangements at [facility] for pick up & med assistance (typically meds given q8h)   Patient's anticipated LOS is less than 2 midnights, meeting these requirements: - Younger than 46 - Lives within 1 hour of care - Has a competent adult at home to recover with post-op recover - NO history of  - Chronic pain requiring opiods  - Diabetes  - Coronary Artery Disease  - Heart failure  - Heart attack  - Stroke  - DVT/VTE  - Cardiac arrhythmia  - Respiratory Failure/COPD  - Renal failure  - Anemia  - Advanced Liver disease  Costella Hatcher, PA-C Orthopedic Surgery [facility] [place] [phone]

## 2022-07-08 ENCOUNTER — Observation Stay (HOSPITAL_COMMUNITY)
Admission: RE | Admit: 2022-07-08 | Discharge: 2022-07-09 | Disposition: A | Discharge status: Home Health | Payer: Medicare HMO | Source: Ambulatory Visit | Attending: Orthopedic Surgery | Admitting: Orthopedic Surgery

## 2022-07-08 ENCOUNTER — Encounter (HOSPITAL_COMMUNITY): Payer: Self-pay | Admitting: Orthopedic Surgery

## 2022-07-08 ENCOUNTER — Encounter (HOSPITAL_COMMUNITY)
Admission: RE | Disposition: A | Discharge status: Home Health | Payer: Self-pay | Source: Ambulatory Visit | Attending: Orthopedic Surgery

## 2022-07-08 ENCOUNTER — Ambulatory Visit (HOSPITAL_COMMUNITY): Payer: Medicare HMO | Admitting: Physician Assistant

## 2022-07-08 ENCOUNTER — Ambulatory Visit (HOSPITAL_BASED_OUTPATIENT_CLINIC_OR_DEPARTMENT_OTHER): Payer: Medicare HMO | Admitting: Anesthesiology

## 2022-07-08 ENCOUNTER — Other Ambulatory Visit: Payer: Self-pay

## 2022-07-08 DIAGNOSIS — Z87891 Personal history of nicotine dependence: Secondary | ICD-10-CM | POA: Insufficient documentation

## 2022-07-08 DIAGNOSIS — Z79899 Other long term (current) drug therapy: Secondary | ICD-10-CM | POA: Insufficient documentation

## 2022-07-08 DIAGNOSIS — I110 Hypertensive heart disease with heart failure: Secondary | ICD-10-CM | POA: Insufficient documentation

## 2022-07-08 DIAGNOSIS — Z96652 Presence of left artificial knee joint: Secondary | ICD-10-CM

## 2022-07-08 DIAGNOSIS — G8918 Other acute postprocedural pain: Secondary | ICD-10-CM | POA: Diagnosis not present

## 2022-07-08 DIAGNOSIS — M1712 Unilateral primary osteoarthritis, left knee: Secondary | ICD-10-CM | POA: Diagnosis not present

## 2022-07-08 DIAGNOSIS — I5032 Chronic diastolic (congestive) heart failure: Secondary | ICD-10-CM | POA: Diagnosis not present

## 2022-07-08 DIAGNOSIS — E039 Hypothyroidism, unspecified: Secondary | ICD-10-CM | POA: Insufficient documentation

## 2022-07-08 DIAGNOSIS — Z01818 Encounter for other preprocedural examination: Secondary | ICD-10-CM

## 2022-07-08 DIAGNOSIS — F0390 Unspecified dementia without behavioral disturbance: Secondary | ICD-10-CM | POA: Insufficient documentation

## 2022-07-08 DIAGNOSIS — Z853 Personal history of malignant neoplasm of breast: Secondary | ICD-10-CM | POA: Insufficient documentation

## 2022-07-08 HISTORY — PX: TOTAL KNEE ARTHROPLASTY: SHX125

## 2022-07-08 LAB — ABO/RH: ABO/RH(D): A POS

## 2022-07-08 SURGERY — ARTHROPLASTY, KNEE, TOTAL
Anesthesia: Regional | Site: Knee | Laterality: Left

## 2022-07-08 MED ORDER — HYDROMORPHONE HCL 1 MG/ML IJ SOLN
0.5000 mg | INTRAMUSCULAR | Status: DC | PRN
  Administered 2022-07-08: 1 mg via INTRAVENOUS
  Filled 2022-07-08: qty 1

## 2022-07-08 MED ORDER — SPIRONOLACTONE 25 MG PO TABS
25.0000 mg | ORAL_TABLET | Freq: Every day | ORAL | Status: DC
  Administered 2022-07-08 – 2022-07-09 (×2): 25 mg via ORAL
  Filled 2022-07-08 (×2): qty 1

## 2022-07-08 MED ORDER — CEFAZOLIN SODIUM-DEXTROSE 2-4 GM/100ML-% IV SOLN
2.0000 g | INTRAVENOUS | Status: AC
  Administered 2022-07-08: 2 g via INTRAVENOUS
  Filled 2022-07-08: qty 100

## 2022-07-08 MED ORDER — ONDANSETRON HCL 4 MG/2ML IJ SOLN
INTRAMUSCULAR | Status: DC | PRN
  Administered 2022-07-08: 4 mg via INTRAVENOUS

## 2022-07-08 MED ORDER — PANTOPRAZOLE SODIUM 40 MG PO TBEC
40.0000 mg | DELAYED_RELEASE_TABLET | Freq: Every day | ORAL | Status: DC
  Administered 2022-07-09: 40 mg via ORAL
  Filled 2022-07-08: qty 1

## 2022-07-08 MED ORDER — METOCLOPRAMIDE HCL 5 MG/ML IJ SOLN: 5.0000 mg | Freq: Three times a day (TID) | INTRAMUSCULAR | Status: DC | PRN

## 2022-07-08 MED ORDER — PROPOFOL 10 MG/ML IV BOLUS
INTRAVENOUS | Status: DC | PRN
  Administered 2022-07-08: 10 mg via INTRAVENOUS

## 2022-07-08 MED ORDER — BUPIVACAINE IN DEXTROSE 0.75-8.25 % IT SOLN
INTRATHECAL | Status: DC | PRN
  Administered 2022-07-08: 1.4 mL via INTRATHECAL

## 2022-07-08 MED ORDER — AMLODIPINE BESYLATE 5 MG PO TABS
2.5000 mg | ORAL_TABLET | Freq: Every day | ORAL | Status: DC
  Administered 2022-07-08: 2.5 mg via ORAL
  Filled 2022-07-08: qty 1

## 2022-07-08 MED ORDER — BISACODYL 10 MG RE SUPP: 10.0000 mg | Freq: Every day | RECTAL | Status: DC | PRN

## 2022-07-08 MED ORDER — KETOROLAC TROMETHAMINE 30 MG/ML IJ SOLN
INTRAMUSCULAR | Status: AC
  Filled 2022-07-08: qty 1

## 2022-07-08 MED ORDER — ONDANSETRON HCL 4 MG/2ML IJ SOLN: 4.0000 mg | Freq: Four times a day (QID) | INTRAMUSCULAR | Status: DC | PRN

## 2022-07-08 MED ORDER — PHENOL 1.4 % MT LIQD: 1.0000 | OROMUCOSAL | Status: DC | PRN

## 2022-07-08 MED ORDER — DEXAMETHASONE SODIUM PHOSPHATE 10 MG/ML IJ SOLN
INTRAMUSCULAR | Status: AC
  Filled 2022-07-08: qty 1

## 2022-07-08 MED ORDER — CHLORHEXIDINE GLUCONATE 0.12 % MT SOLN
15.0000 mL | Freq: Once | OROMUCOSAL | Status: AC
  Administered 2022-07-08: 15 mL via OROMUCOSAL

## 2022-07-08 MED ORDER — MEMANTINE HCL 10 MG PO TABS
10.0000 mg | ORAL_TABLET | Freq: Two times a day (BID) | ORAL | Status: DC
  Administered 2022-07-08 – 2022-07-09 (×2): 10 mg via ORAL
  Filled 2022-07-08 (×2): qty 1

## 2022-07-08 MED ORDER — TRANEXAMIC ACID-NACL 1000-0.7 MG/100ML-% IV SOLN
1000.0000 mg | Freq: Once | INTRAVENOUS | Status: AC
  Administered 2022-07-08: 1000 mg via INTRAVENOUS
  Filled 2022-07-08: qty 100

## 2022-07-08 MED ORDER — AMISULPRIDE (ANTIEMETIC) 5 MG/2ML IV SOLN: 10.0000 mg | Freq: Once | INTRAVENOUS | Status: DC | PRN

## 2022-07-08 MED ORDER — FERROUS SULFATE 325 (65 FE) MG PO TABS
325.0000 mg | ORAL_TABLET | Freq: Three times a day (TID) | ORAL | Status: DC
  Administered 2022-07-08 – 2022-07-09 (×3): 325 mg via ORAL
  Filled 2022-07-08 (×3): qty 1

## 2022-07-08 MED ORDER — OXYCODONE HCL 5 MG PO TABS
5.0000 mg | ORAL_TABLET | ORAL | Status: DC | PRN
  Administered 2022-07-09: 5 mg via ORAL
  Filled 2022-07-08: qty 1

## 2022-07-08 MED ORDER — PROPOFOL 1000 MG/100ML IV EMUL
INTRAVENOUS | Status: AC
  Filled 2022-07-08: qty 100

## 2022-07-08 MED ORDER — LAMOTRIGINE 100 MG PO TABS
200.0000 mg | ORAL_TABLET | Freq: Every day | ORAL | Status: DC
  Administered 2022-07-08: 200 mg via ORAL
  Filled 2022-07-08: qty 2

## 2022-07-08 MED ORDER — OLANZAPINE 10 MG PO TABS: 30.0000 mg | ORAL_TABLET | Freq: Every day | ORAL | Status: DC

## 2022-07-08 MED ORDER — METHOCARBAMOL 500 MG IVPB - SIMPLE MED: 500.0000 mg | Freq: Four times a day (QID) | INTRAVENOUS | Status: DC | PRN

## 2022-07-08 MED ORDER — METHOCARBAMOL 500 MG PO TABS
500.0000 mg | ORAL_TABLET | Freq: Four times a day (QID) | ORAL | Status: DC | PRN
  Administered 2022-07-08 – 2022-07-09 (×3): 500 mg via ORAL
  Filled 2022-07-08 (×3): qty 1

## 2022-07-08 MED ORDER — BUPIVACAINE-EPINEPHRINE (PF) 0.25% -1:200000 IJ SOLN
INTRAMUSCULAR | Status: AC
  Filled 2022-07-08: qty 30

## 2022-07-08 MED ORDER — DIPHENHYDRAMINE HCL 12.5 MG/5ML PO ELIX: 12.5000 mg | ORAL_SOLUTION | ORAL | Status: DC | PRN

## 2022-07-08 MED ORDER — LACTATED RINGERS IV SOLN: INTRAVENOUS | Status: DC

## 2022-07-08 MED ORDER — HYDROCORTISONE 10 MG PO TABS
10.0000 mg | ORAL_TABLET | Freq: Every day | ORAL | Status: DC
  Administered 2022-07-09: 10 mg via ORAL
  Filled 2022-07-08: qty 1

## 2022-07-08 MED ORDER — ORAL CARE MOUTH RINSE: 15.0000 mL | Freq: Once | OROMUCOSAL | Status: AC

## 2022-07-08 MED ORDER — KETOROLAC TROMETHAMINE 30 MG/ML IJ SOLN
INTRAMUSCULAR | Status: DC | PRN
  Administered 2022-07-08: 30 mg

## 2022-07-08 MED ORDER — LEVOTHYROXINE SODIUM 125 MCG PO TABS
125.0000 ug | ORAL_TABLET | Freq: Every day | ORAL | Status: DC
  Administered 2022-07-09: 125 ug via ORAL
  Filled 2022-07-08: qty 1

## 2022-07-08 MED ORDER — ONDANSETRON HCL 4 MG PO TABS: 4.0000 mg | ORAL_TABLET | Freq: Four times a day (QID) | ORAL | Status: DC | PRN

## 2022-07-08 MED ORDER — GABAPENTIN 100 MG PO CAPS
200.0000 mg | ORAL_CAPSULE | Freq: Three times a day (TID) | ORAL | Status: DC
  Administered 2022-07-08 – 2022-07-09 (×3): 200 mg via ORAL
  Filled 2022-07-08 (×3): qty 2

## 2022-07-08 MED ORDER — 0.9 % SODIUM CHLORIDE (POUR BTL) OPTIME
TOPICAL | Status: DC | PRN
  Administered 2022-07-08: 1000 mL

## 2022-07-08 MED ORDER — ACETAMINOPHEN 500 MG PO TABS
1000.0000 mg | ORAL_TABLET | Freq: Four times a day (QID) | ORAL | Status: DC
  Administered 2022-07-08 – 2022-07-09 (×4): 1000 mg via ORAL
  Filled 2022-07-08 (×5): qty 2

## 2022-07-08 MED ORDER — MIDAZOLAM HCL 2 MG/2ML IJ SOLN
1.0000 mg | INTRAMUSCULAR | Status: DC
  Filled 2022-07-08: qty 2

## 2022-07-08 MED ORDER — ONDANSETRON HCL 4 MG/2ML IJ SOLN
INTRAMUSCULAR | Status: AC
  Filled 2022-07-08: qty 2

## 2022-07-08 MED ORDER — PROPOFOL 500 MG/50ML IV EMUL
INTRAVENOUS | Status: DC | PRN
  Administered 2022-07-08: 60 ug/kg/min via INTRAVENOUS

## 2022-07-08 MED ORDER — CEFAZOLIN SODIUM-DEXTROSE 2-4 GM/100ML-% IV SOLN
2.0000 g | Freq: Four times a day (QID) | INTRAVENOUS | Status: AC
  Administered 2022-07-08 (×2): 2 g via INTRAVENOUS
  Filled 2022-07-08 (×2): qty 100

## 2022-07-08 MED ORDER — TRANEXAMIC ACID-NACL 1000-0.7 MG/100ML-% IV SOLN
1000.0000 mg | INTRAVENOUS | Status: AC
  Administered 2022-07-08: 1000 mg via INTRAVENOUS
  Filled 2022-07-08: qty 100

## 2022-07-08 MED ORDER — SODIUM CHLORIDE (PF) 0.9 % IJ SOLN
INTRAMUSCULAR | Status: AC
  Filled 2022-07-08: qty 50

## 2022-07-08 MED ORDER — OXYCODONE HCL 5 MG PO TABS
10.0000 mg | ORAL_TABLET | ORAL | Status: DC | PRN
  Administered 2022-07-08 – 2022-07-09 (×4): 10 mg via ORAL
  Filled 2022-07-08 (×4): qty 2

## 2022-07-08 MED ORDER — DEXAMETHASONE SODIUM PHOSPHATE 10 MG/ML IJ SOLN
8.0000 mg | Freq: Once | INTRAMUSCULAR | Status: AC
  Administered 2022-07-08: 8 mg via INTRAVENOUS

## 2022-07-08 MED ORDER — MENTHOL 3 MG MT LOZG: 1.0000 | LOZENGE | OROMUCOSAL | Status: DC | PRN

## 2022-07-08 MED ORDER — RAMELTEON 8 MG PO TABS
8.0000 mg | ORAL_TABLET | Freq: Every day | ORAL | Status: DC
Start: 2022-07-08 — End: 2022-07-09
  Administered 2022-07-08: 8 mg via ORAL
  Filled 2022-07-08: qty 1

## 2022-07-08 MED ORDER — ONDANSETRON HCL 4 MG/2ML IJ SOLN: 4.0000 mg | Freq: Once | INTRAMUSCULAR | Status: DC | PRN

## 2022-07-08 MED ORDER — ACETAMINOPHEN 10 MG/ML IV SOLN
1000.0000 mg | Freq: Once | INTRAVENOUS | Status: DC | PRN
Start: 2022-07-08 — End: 2022-07-08

## 2022-07-08 MED ORDER — OLANZAPINE 10 MG PO TABS
30.0000 mg | ORAL_TABLET | Freq: Every day | ORAL | Status: DC
  Administered 2022-07-08: 30 mg via ORAL
  Filled 2022-07-08: qty 3

## 2022-07-08 MED ORDER — DOCUSATE SODIUM 100 MG PO CAPS
100.0000 mg | ORAL_CAPSULE | Freq: Two times a day (BID) | ORAL | Status: DC
  Administered 2022-07-08 – 2022-07-09 (×2): 100 mg via ORAL
  Filled 2022-07-08 (×2): qty 1

## 2022-07-08 MED ORDER — PHENYLEPHRINE HCL-NACL 20-0.9 MG/250ML-% IV SOLN
INTRAVENOUS | Status: AC
  Filled 2022-07-08: qty 250

## 2022-07-08 MED ORDER — ATORVASTATIN CALCIUM 40 MG PO TABS
40.0000 mg | ORAL_TABLET | Freq: Every day | ORAL | Status: DC
Start: 2022-07-08 — End: 2022-07-09
  Administered 2022-07-08: 40 mg via ORAL
  Filled 2022-07-08: qty 1

## 2022-07-08 MED ORDER — DEXAMETHASONE SODIUM PHOSPHATE 10 MG/ML IJ SOLN
10.0000 mg | Freq: Once | INTRAMUSCULAR | Status: AC
  Administered 2022-07-09: 10 mg via INTRAVENOUS
  Filled 2022-07-08: qty 1

## 2022-07-08 MED ORDER — PHENYLEPHRINE HCL-NACL 20-0.9 MG/250ML-% IV SOLN
INTRAVENOUS | Status: DC | PRN
  Administered 2022-07-08: 50 ug/min via INTRAVENOUS

## 2022-07-08 MED ORDER — TRAZODONE HCL 50 MG PO TABS
150.0000 mg | ORAL_TABLET | Freq: Every day | ORAL | Status: DC
  Administered 2022-07-08: 150 mg via ORAL
  Filled 2022-07-08 (×2): qty 1

## 2022-07-08 MED ORDER — ASPIRIN 81 MG PO CHEW
81.0000 mg | CHEWABLE_TABLET | Freq: Two times a day (BID) | ORAL | Status: DC
  Administered 2022-07-08 – 2022-07-09 (×2): 81 mg via ORAL
  Filled 2022-07-08 (×2): qty 1

## 2022-07-08 MED ORDER — BUPIVACAINE-EPINEPHRINE (PF) 0.25% -1:200000 IJ SOLN
INTRAMUSCULAR | Status: DC | PRN
  Administered 2022-07-08: 30 mL

## 2022-07-08 MED ORDER — POVIDONE-IODINE 10 % EX SWAB
2.0000 | Freq: Once | CUTANEOUS | Status: AC
  Administered 2022-07-08: 2 via TOPICAL

## 2022-07-08 MED ORDER — POLYETHYLENE GLYCOL 3350 17 G PO PACK
17.0000 g | PACK | Freq: Every day | ORAL | Status: DC | PRN
Start: 2022-07-08 — End: 2022-07-09

## 2022-07-08 MED ORDER — SODIUM CHLORIDE 0.9 % IV SOLN
INTRAVENOUS | Status: DC
Start: 2022-07-08 — End: 2022-07-09

## 2022-07-08 MED ORDER — LIP MEDEX EX OINT
TOPICAL_OINTMENT | CUTANEOUS | Status: AC
  Filled 2022-07-08: qty 7

## 2022-07-08 MED ORDER — BUPIVACAINE-EPINEPHRINE (PF) 0.5% -1:200000 IJ SOLN
INTRAMUSCULAR | Status: DC | PRN
  Administered 2022-07-08: 30 mL via PERINEURAL

## 2022-07-08 MED ORDER — FENTANYL CITRATE PF 50 MCG/ML IJ SOSY
50.0000 ug | PREFILLED_SYRINGE | INTRAMUSCULAR | Status: DC
  Administered 2022-07-08: 50 ug via INTRAVENOUS
  Filled 2022-07-08: qty 2

## 2022-07-08 MED ORDER — FENTANYL CITRATE PF 50 MCG/ML IJ SOSY: 25.0000 ug | PREFILLED_SYRINGE | INTRAMUSCULAR | Status: DC | PRN

## 2022-07-08 MED ORDER — METOCLOPRAMIDE HCL 5 MG PO TABS: 5.0000 mg | ORAL_TABLET | Freq: Three times a day (TID) | ORAL | Status: DC | PRN

## 2022-07-08 MED ORDER — SODIUM CHLORIDE 0.9 % IR SOLN
Status: DC | PRN
  Administered 2022-07-08: 1000 mL

## 2022-07-08 MED ORDER — SODIUM CHLORIDE (PF) 0.9 % IJ SOLN
INTRAMUSCULAR | Status: DC | PRN
  Administered 2022-07-08: 30 mL

## 2022-07-08 MED ORDER — METOPROLOL TARTRATE 25 MG PO TABS
25.0000 mg | ORAL_TABLET | Freq: Two times a day (BID) | ORAL | Status: DC
  Administered 2022-07-08 – 2022-07-09 (×2): 25 mg via ORAL
  Filled 2022-07-08 (×2): qty 1

## 2022-07-08 SURGICAL SUPPLY — 60 items
ADH SKN CLS APL DERMABOND .7 (GAUZE/BANDAGES/DRESSINGS) ×1
ATTUNE MED ANAT PAT 35 KNEE (Knees) ×1 IMPLANT
ATTUNE PS FEM LT SZ 4 CEM KNEE (Femur) ×1 IMPLANT
ATTUNE PSRP INSR SZ4 7 KNEE (Insert) ×1 IMPLANT
BAG COUNTER SPONGE SURGICOUNT (BAG) IMPLANT
BAG SPEC THK2 15X12 ZIP CLS (MISCELLANEOUS)
BAG SPNG CNTER NS LX DISP (BAG)
BAG ZIPLOCK 12X15 (MISCELLANEOUS) IMPLANT
BASE TIBIAL ROT PLAT SZ 3 KNEE (Knees) IMPLANT
BLADE SAW SGTL 11.0X1.19X90.0M (BLADE) ×1 IMPLANT
BLADE SAW SGTL 13.0X1.19X90.0M (BLADE) ×2 IMPLANT
BNDG CMPR MED 15X6 ELC VLCR LF (GAUZE/BANDAGES/DRESSINGS) ×1
BNDG ELASTIC 6X15 VLCR STRL LF (GAUZE/BANDAGES/DRESSINGS) ×1 IMPLANT
BNDG ELASTIC 6X5.8 VLCR STR LF (GAUZE/BANDAGES/DRESSINGS) ×2 IMPLANT
BOWL SMART MIX CTS (DISPOSABLE) ×2 IMPLANT
BSPLAT TIB 3 CMNT ROT PLAT STR (Knees) ×1 IMPLANT
CEMENT HV SMART SET (Cement) ×2 IMPLANT
CUFF TOURN SGL QUICK 34 (TOURNIQUET CUFF) ×2
CUFF TRNQT CYL 34X4.125X (TOURNIQUET CUFF) ×1 IMPLANT
DERMABOND ADVANCED (GAUZE/BANDAGES/DRESSINGS) ×1
DERMABOND ADVANCED .7 DNX12 (GAUZE/BANDAGES/DRESSINGS) ×1 IMPLANT
DRAPE SHEET LG 3/4 BI-LAMINATE (DRAPES) ×2 IMPLANT
DRAPE U-SHAPE 47X51 STRL (DRAPES) ×2 IMPLANT
DRESSING AQUACEL AG SP 3.5X10 (GAUZE/BANDAGES/DRESSINGS) ×1 IMPLANT
DRSG AQUACEL AG SP 3.5X10 (GAUZE/BANDAGES/DRESSINGS) ×2
DURAPREP 26ML APPLICATOR (WOUND CARE) ×4 IMPLANT
ELECT REM PT RETURN 15FT ADLT (MISCELLANEOUS) ×2 IMPLANT
GLOVE BIO SURGEON STRL SZ 6 (GLOVE) ×2 IMPLANT
GLOVE BIOGEL PI IND STRL 6.5 (GLOVE) ×1 IMPLANT
GLOVE BIOGEL PI IND STRL 7.5 (GLOVE) ×1 IMPLANT
GLOVE BIOGEL PI INDICATOR 6.5 (GLOVE) ×1
GLOVE BIOGEL PI INDICATOR 7.5 (GLOVE) ×1
GLOVE ORTHO TXT STRL SZ7.5 (GLOVE) ×4 IMPLANT
GOWN STRL REUS W/ TWL LRG LVL3 (GOWN DISPOSABLE) ×2 IMPLANT
GOWN STRL REUS W/TWL LRG LVL3 (GOWN DISPOSABLE) ×4
HANDPIECE INTERPULSE COAX TIP (DISPOSABLE) ×2
HOLDER FOLEY CATH W/STRAP (MISCELLANEOUS) IMPLANT
KIT TURNOVER KIT A (KITS) IMPLANT
MANIFOLD NEPTUNE II (INSTRUMENTS) ×2 IMPLANT
NDL SAFETY ECLIPSE 18X1.5 (NEEDLE) IMPLANT
NEEDLE HYPO 18GX1.5 SHARP (NEEDLE)
NS IRRIG 1000ML POUR BTL (IV SOLUTION) ×2 IMPLANT
PACK TOTAL KNEE CUSTOM (KITS) ×2 IMPLANT
PROTECTOR NERVE ULNAR (MISCELLANEOUS) ×2 IMPLANT
SET HNDPC FAN SPRY TIP SCT (DISPOSABLE) ×1 IMPLANT
SET PAD KNEE POSITIONER (MISCELLANEOUS) ×2 IMPLANT
SPIKE FLUID TRANSFER (MISCELLANEOUS) ×4 IMPLANT
SUT MNCRL AB 4-0 PS2 18 (SUTURE) ×2 IMPLANT
SUT STRATAFIX PDS+ 0 24IN (SUTURE) ×2 IMPLANT
SUT VIC AB 1 CT1 36 (SUTURE) ×2 IMPLANT
SUT VIC AB 2-0 CT1 27 (SUTURE) ×4
SUT VIC AB 2-0 CT1 TAPERPNT 27 (SUTURE) ×2 IMPLANT
SYR 3ML LL SCALE MARK (SYRINGE) ×2 IMPLANT
TIBIAL BASE ROT PLAT SZ 3 KNEE (Knees) ×2 IMPLANT
TOWEL GREEN STERILE FF (TOWEL DISPOSABLE) ×2 IMPLANT
TRAY CATH INTERMITTENT SS 16FR (CATHETERS) ×2 IMPLANT
TRAY FOLEY MTR SLVR 16FR STAT (SET/KITS/TRAYS/PACK) ×2 IMPLANT
TUBE SUCTION HIGH CAP CLEAR NV (SUCTIONS) ×2 IMPLANT
WATER STERILE IRR 1000ML POUR (IV SOLUTION) ×4 IMPLANT
WRAP KNEE MAXI GEL POST OP (GAUZE/BANDAGES/DRESSINGS) ×2 IMPLANT

## 2022-07-08 NOTE — Care Plan (Signed)
Ortho Bundle Case Management Note  Patient Details  Name: [name] MRN: [id number] Date of Birth: Mar 14, 1941  L TKA on 07-08-22 DCP:  Home with [facility] 24/7 caregivers x 1 week.  Resident at [facility] at [facility].   DME:  RW ordered through [place] PT:  OP PT at [facility]                   DME Arranged:  Walker rolling DME Agency:  [employer]  HH Arranged:  NA [place] Agency:  NA  Additional Comments: Please contact me with any questions of if this plan should need to change.  Marianne Sofia, RN,CCM [facility]  [phone] 07/08/2022, 3:25 PM

## 2022-07-08 NOTE — Anesthesia Preprocedure Evaluation (Addendum)
Anesthesia Evaluation  Patient identified by MRN, date of birth, ID band Patient awake    Reviewed: Allergy & Precautions, NPO status , Patient's Chart, lab work & pertinent test results  History of Anesthesia Complications (+) PONV and history of anesthetic complications  Airway Mallampati: II  TM Distance: >3 FB Neck ROM: Full    Dental no notable dental hx.    Pulmonary former smoker,    Pulmonary exam normal        Cardiovascular hypertension, Pt. on medications and Pt. on home beta blockers + CAD, + Past MI and +CHF  Normal cardiovascular exam     Neuro/Psych  Headaches, PSYCHIATRIC DISORDERS Anxiety Depression Bipolar Disorder Schizophrenia Dementia    GI/Hepatic Neg liver ROS, GERD  Medicated and Controlled,  Endo/Other  Hypothyroidism Adrenal insufficiency  Renal/GU negative Renal ROS     Musculoskeletal  (+) Arthritis , Osteoarthritis,    Abdominal   Peds  Hematology   Anesthesia Other Findings Left knee osteoarthritis  Reproductive/Obstetrics                            Anesthesia Physical Anesthesia Plan  ASA: 2  Anesthesia Plan: Regional and Spinal   Post-op Pain Management:    Induction: Intravenous  PONV Risk Score and Plan: 3 and Ondansetron, Dexamethasone, Propofol infusion and Treatment may vary due to age or medical condition  Airway Management Planned: Simple Face Mask  Additional Equipment:   Intra-op Plan:   Post-operative Plan:   Informed Consent: I have reviewed the patients History and Physical, chart, labs and discussed the procedure including the risks, benefits and alternatives for the proposed anesthesia with the patient or authorized representative who has indicated his/her understanding and acceptance.     Dental advisory given  Plan Discussed with: CRNA  Anesthesia Plan Comments:         Anesthesia Quick Evaluation

## 2022-07-08 NOTE — Op Note (Signed)
NAME:  [name]                      MEDICAL RECORD NO.:  [id number]                             FACILITY:  [facility]      PHYSICIAN:  Pietro Cassis. Alvan Dame, M.D.  DATE OF BIRTH:  11-15-1941      DATE OF PROCEDURE:  07/08/2022                                     OPERATIVE REPORT         PREOPERATIVE DIAGNOSIS:  Left knee osteoarthritis.      POSTOPERATIVE DIAGNOSIS:  Left knee osteoarthritis.      FINDINGS:  The patient was noted to have complete loss of cartilage and   bone-on-bone arthritis with associated osteophytes in the medial and patellofemoral compartments of   the knee with a significant synovitis and associated effusion.  The patient had failed months of conservative treatment including medications, injection therapy, activity modification.     PROCEDURE:  Left total knee replacement.      COMPONENTS USED:  DePuy Attune rotating platform posterior stabilized knee   system, a size 4 femur, 3 tibia, size 7 mm PS AOX insert, and 35 anatomic patellar   button.      SURGEON:  Pietro Cassis. Alvan Dame, M.D.      ASSISTANT:  Surgical team     ANESTHESIA:  Regional and Spinal.      SPECIMENS:  None.      COMPLICATION:  None.      DRAINS:  None.  EBL: <100 cc      TOURNIQUET TIME:   Total Tourniquet Time Documented: Thigh (Left) - 31 minutes Total: Thigh (Left) - 31 minutes  .      The patient was stable to the recovery room.      INDICATION FOR PROCEDURE:  [name] is a 81 y.o. female patient of   mine.  The patient had been seen, evaluated, and treated for months conservatively in the   office with medication, activity modification, and injections.  The patient had   radiographic changes of bone-on-bone arthritis with endplate sclerosis and osteophytes noted.  Based on the radiographic changes and failed conservative measures, the patient   decided to proceed with definitive treatment, total knee replacement.  Risks of infection, DVT, component failure, need for revision  surgery, neurovascular injury were reviewed in the office setting.  The postop course was reviewed stressing the efforts to maximize post-operative satisfaction and function.  Consent was obtained for benefit of pain   relief.      PROCEDURE IN DETAIL:  The patient was brought to the operative theater.   Once adequate anesthesia, preoperative antibiotics, 2 gm of Ancef,1 gm of Tranexamic Acid, and 10 mg of Decadron administered, the patient was positioned supine with a left thigh tourniquet placed.  The  left lower extremity was prepped and draped in sterile fashion.  A time-   out was performed identifying the patient, planned procedure, and the appropriate extremity.      The left lower extremity was placed in the [facility] leg holder.  The leg was   exsanguinated, tourniquet elevated to 250 mmHg.  A midline incision was   made followed  by median parapatellar arthrotomy.  Following initial   exposure, attention was first directed to the patella.  Precut   measurement was noted to be 22 mm.  I resected down to 13-14 mm and used a   35 anatomic patellar button to restore patellar height as well as cover the cut surface.      The lug holes were drilled and a metal shim was placed to protect the   patella from retractors and saw blade during the procedure.      At this point, attention was now directed to the femur.  The femoral   canal was opened with a drill, irrigated to try to prevent fat emboli.  An   intramedullary rod was passed at 5 degrees valgus, 9 mm of bone was   resected off the distal femur.  Following this resection, the tibia was   subluxated anteriorly.  Using the extramedullary guide, 3 mm of bone was resected off   the proximal medial tibia.  We confirmed the gap would be   stable medially and laterally with a size 5 spacer block as well as confirmed that the tibial cut was perpendicular in the coronal plane, checking with an alignment rod.      Once this was done, I sized the  femur to be a size 4 in the anterior-   posterior dimension, chose a standard component based on medial and   lateral dimension.  The size 4 rotation block was then pinned in   position anterior referenced using the C-clamp to set rotation.  The   anterior, posterior, and  chamfer cuts were made without difficulty nor   notching making certain that I was along the anterior cortex to help   with flexion gap stability.      The final box cut was made off the lateral aspect of distal femur.      At this point, the tibia was sized to be a size 3.  The size 3 tray was   then pinned in position through the medial third of the tubercle,   drilled, and keel punched.  Trial reduction was now carried with a 4 femur,  3 tibia, a size 7 mm PS insert, and the 35 anatomic patella botton.  The knee was brought to full extension with good flexion stability with the patella   tracking through the trochlea without application of pressure.  Given   all these findings the trial components removed.  Final components were   opened and cement was mixed.  The knee was irrigated with normal saline solution and pulse lavage.  The synovial lining was   then injected with 30 cc of 0.25% Marcaine with epinephrine, 1 cc of Toradol and 30 cc of NS for a total of 61 cc.     Final implants were then cemented onto cleaned and dried cut surfaces of bone with the knee brought to extension with a size 7 mm PS trial insert.      Once the cement had fully cured, excess cement was removed   throughout the knee.  I confirmed that I was satisfied with the range of   motion and stability, and the final size 7 mm PS AOX insert was chosen.  It was   placed into the knee.      The tourniquet had been let down at 31 minutes.  No significant   hemostasis was required.  The extensor mechanism was then reapproximated using #1 Vicryl and #1  Stratafix sutures with the knee   in flexion.  The   remaining wound was closed with 2-0 Vicryl  and running 4-0 Monocryl.   The knee was cleaned, dried, dressed sterilely using Dermabond and   Aquacel dressing.  The patient was then   brought to recovery room in stable condition, tolerating the procedure   well.               Pietro Cassis Alvan Dame, M.D.    07/08/2022 11:39 AM

## 2022-07-08 NOTE — Evaluation (Signed)
Physical Therapy Evaluation Patient Details Name: [name] MRN: [id number] DOB: 1941/01/26 Today's Date: 07/08/2022  History of Present Illness  Pt is an 81yo female presenting s/p L-TKA on 07/08/22. PMH: R-BBB, Bipolar, hx of breast cancer, anxiety and depression, GERD, HLD, HTN, schizophrenia, hypothyroidism, CKD3 adrenal insufficiency, hx of NSTEMI, CHF, dementia  Clinical Impression  [name] is a 81 y.o. female POD 0 s/p L-TKA; pt resides at [facility] at [facility]. Patient reports modified independence using rollator for home distances and WC for community distances for mobility at baseline; pt's WC present in room during session. Patient is now limited by functional impairments (see PT problem list below) and requires min assist for bed mobility and min guard for transfers, further mobility deferred secondary to pain. Patient instructed in exercise to facilitate ROM and circulation to manage edema. Provided incentive spirometer and with Vcs pt able to achieve 769m. Patient will benefit from continued skilled PT interventions to address impairments and progress towards PLOF. Acute PT will follow to progress mobility and HEP in preparation for safe discharge back to ILF.                  Recommendations for follow up therapy are one component of a multi-disciplinary discharge planning process, led by the attending physician.  Recommendations may be updated based on patient status, additional functional criteria and insurance authorization.  Follow Up Recommendations Follow physician's recommendations for discharge plan and follow up therapies      Assistance Recommended at Discharge Intermittent Supervision/Assistance  Patient can return home with the following  A little help with walking and/or transfers;A little help with bathing/dressing/bathroom;Assistance with cooking/housework;Assist for transportation;Help with stairs or ramp for entrance    Equipment Recommendations  Rolling walker (2 wheels)  Recommendations for Other Services       Functional Status Assessment Patient has had a recent decline in their functional status and demonstrates the ability to make significant improvements in function in a reasonable and predictable amount of time.     Precautions / Restrictions Precautions Precautions: Fall Restrictions Weight Bearing Restrictions: No      Mobility  Bed Mobility Overal bed mobility: Needs Assistance Bed Mobility: Supine to Sit     Supine to sit: Min assist     General bed mobility comments: Min assist to bring LLE off bed    Transfers Overall transfer level: Needs assistance Equipment used: Rolling walker (2 wheels) Transfers: Sit to/from Stand, Bed to chair/wheelchair/BSC Sit to Stand: Min guard, From elevated surface   Step pivot transfers: Min guard       General transfer comment: For safety only, VCs for sequencing and hand placement    Ambulation/Gait               General Gait Details: deferred  Stairs            Wheelchair Mobility    Modified Rankin (Stroke Patients Only)       Balance Overall balance assessment: Needs assistance Sitting-balance support: Feet supported, No upper extremity supported Sitting balance-Leahy Scale: Good     Standing balance support: Reliant on assistive device for balance, During functional activity, Bilateral upper extremity supported Standing balance-Leahy Scale: Poor                               Pertinent Vitals/Pain Pain Assessment Pain Assessment: 0-10 Pain Score: 9  Pain Location: left knee and thigh  Pain Descriptors / Indicators: Operative site guarding Pain Intervention(s): Limited activity within patient's tolerance, Monitored during session, Repositioned, Ice applied    Home Living Family/patient expects to be discharged to:: Private residence Living Arrangements: Alone Available Help at Discharge: Available 24  hours/day;Personal care attendant ([facility] 24/7 for two weeks) Type of Home: Independent living facility Home Access: Level entry;Elevator       Home Layout: One level Home Equipment: Rollator (4 wheels);Wheelchair - manual;Grab bars - toilet;Grab bars - tub/shower Additional Comments: [facility] Prior Level of Function : Needs assist             Mobility Comments: patient states she uses rollator for mobility, WC for longer distance ADLs Comments: Spongebath self except for back/legs/feet, CNA Ivin Booty comes M-F for mornings, provides assist     Hand Dominance   Dominant Hand: Right    Extremity/Trunk Assessment   Upper Extremity Assessment Upper Extremity Assessment: Overall WFL for tasks assessed    Lower Extremity Assessment Lower Extremity Assessment: RLE deficits/detail;LLE deficits/detail RLE Deficits / Details: MMT ank DF/PF 4/5 RLE Sensation: WNL LLE Deficits / Details: MMT ank DF/PF 4/5, pt only able to perform SLR ~1" off bed LLE Sensation: WNL    Cervical / Trunk Assessment Cervical / Trunk Assessment: Kyphotic;Back Surgery  Communication   Communication: No difficulties  Cognition Arousal/Alertness: Awake/alert Behavior During Therapy: WFL for tasks assessed/performed Overall Cognitive Status: Within Functional Limits for tasks assessed                                          General Comments General comments (skin integrity, edema, etc.): Gilmore Laroche present    Exercises Total Joint Exercises Ankle Circles/Pumps: AROM, Both, 10 reps   Assessment/Plan    PT Assessment Patient needs continued PT services  PT Problem List Decreased strength;Decreased range of motion;Decreased activity tolerance;Decreased balance;Decreased mobility;Decreased coordination;Pain       PT Treatment Interventions DME instruction;Gait training;Functional mobility training;Therapeutic activities;Therapeutic  exercise;Balance training;Neuromuscular re-education;Patient/family education    PT Goals (Current goals can be found in the Care Plan section)  Acute Rehab PT Goals Patient Stated Goal: To walk better PT Goal Formulation: With patient Time For Goal Achievement: 07/15/22 Potential to Achieve Goals: Good    Frequency 7X/week     Co-evaluation               AM-PAC PT "6 Clicks" Mobility  Outcome Measure Help needed turning from your back to your side while in a flat bed without using bedrails?: None Help needed moving from lying on your back to sitting on the side of a flat bed without using bedrails?: A Little Help needed moving to and from a bed to a chair (including a wheelchair)?: A Little Help needed standing up from a chair using your arms (e.g., wheelchair or bedside chair)?: A Little Help needed to walk in hospital room?: A Little Help needed climbing 3-5 steps with a railing? : A Lot 6 Click Score: 18    End of Session Equipment Utilized During Treatment: Gait belt Activity Tolerance: Patient limited by pain Patient left: in chair;with call bell/phone within reach;with chair alarm set;with family/visitor present;with SCD's reapplied Nurse Communication: Mobility status PT Visit Diagnosis: Pain;Difficulty in walking, not elsewhere classified (R26.2) Pain - Right/Left: Left Pain - part of body: Knee    Time: 1308-6578  PT Time Calculation (min) (ACUTE ONLY): 21 min   Charges:   PT Evaluation $PT Eval Low Complexity: 1 Low          Coolidge Breeze, PT, DPT [facility] Department Office: [phone] Pager: [phone]  Coolidge Breeze 07/08/2022, 7:14 PM

## 2022-07-08 NOTE — Anesthesia Postprocedure Evaluation (Signed)
Anesthesia Post Note  Patient: Tamera Pingley Stogner  Procedure(s) Performed: TOTAL KNEE ARTHROPLASTY (Left: Knee)     Patient location during evaluation: PACU Anesthesia Type: Regional and Spinal Level of consciousness: awake Pain management: pain level controlled Vital Signs Assessment: post-procedure vital signs reviewed and stable Respiratory status: spontaneous breathing, nonlabored ventilation, respiratory function stable and patient connected to nasal cannula oxygen Cardiovascular status: stable and blood pressure returned to baseline Postop Assessment: no apparent nausea or vomiting Anesthetic complications: no   No notable events documented.  Last Vitals:  Vitals:   07/08/22 1253 07/08/22 1509  BP: 112/68 135/62  Pulse: 62 71  Resp: 16 18  Temp: 36.4 C 36.6 C  SpO2: 95% 98%    Last Pain:  Vitals:   07/08/22 1253  TempSrc: Oral  PainSc: 2                  Jordie Skalsky P Chrystal Zeimet

## 2022-07-08 NOTE — Anesthesia Procedure Notes (Signed)
Spinal  Patient location during procedure: OR Start time: 07/08/2022 10:11 AM End time: 07/08/2022 11:13 AM Reason for block: surgical anesthesia Staffing Performed: resident/CRNA  Anesthesiologist: Murvin Natal, MD Resident/CRNA: Lind Covert, CRNA Performed by: Lind Covert, CRNA Authorized by: Murvin Natal, MD   Preanesthetic Checklist Completed: patient identified, IV checked, site marked, risks and benefits discussed, surgical consent, monitors and equipment checked, pre-op evaluation and timeout performed Spinal Block Patient position: sitting Prep: DuraPrep Patient monitoring: heart rate, cardiac monitor, continuous pulse ox and blood pressure Approach: midline Location: L3-4 Injection technique: single-shot Needle Needle type: Pencan  Needle gauge: 24 G Needle length: 10 cm Needle insertion depth: 6 cm Assessment Sensory level: T6 Events: second provider Additional Notes Patient in sitting position. Site identified, cleansed with Duraprep. 1% Lidocaine injected at site. SAB without difficulty. Patient to supine position

## 2022-07-08 NOTE — Transfer of Care (Signed)
Immediate Anesthesia Transfer of Care Note  Patient: [name]  Procedure(s) Performed: TOTAL KNEE ARTHROPLASTY (Left: Knee)  Patient Location: PACU  Anesthesia Type:Spinal  Level of Consciousness: sedated  Airway & Oxygen Therapy: Patient Spontanous Breathing and Patient connected to face mask oxygen  Post-op Assessment: Report given to RN and Post -op Vital signs reviewed and stable  Post vital signs: Reviewed and stable  Last Vitals:  Vitals Value Taken Time  BP    Temp    Pulse 62 07/08/22 1145  Resp    SpO2 99 % 07/08/22 1145  Vitals shown include unvalidated device data.  Last Pain:  Vitals:   07/08/22 0906  TempSrc:   PainSc: 0-No pain      Patients Stated Pain Goal: 4 (86/75/44 9201)  Complications: No notable events documented.

## 2022-07-08 NOTE — Anesthesia Procedure Notes (Signed)
Anesthesia Regional Block: Adductor canal block   Pre-Anesthetic Checklist: , timeout performed,  Correct Patient, Correct Site, Correct Laterality,  Correct Procedure,, site marked,  Risks and benefits discussed,  Surgical consent,  Pre-op evaluation,  At surgeon's request and post-op pain management  Laterality: Left  Prep: chloraprep       Needles:  Injection technique: Single-shot  Needle Type: Echogenic Stimulator Needle     Needle Length: 10cm  Needle Gauge: 20     Additional Needles:   Procedures:,,,, ultrasound used (permanent image in chart),,    Narrative:  Start time: 07/08/2022 8:55 AM End time: 07/08/2022 9:05 AM Injection made incrementally with aspirations every 5 mL.  Performed by: Personally  Anesthesiologist: Murvin Natal, MD  Additional Notes: Functioning IV was confirmed and monitors were applied. A time-out was performed. Hand hygiene and sterile gloves were used. The thigh was placed in a frog-leg position and prepped in a sterile fashion. A 181m 20ga Bbraun echogenic stimulator needle was placed using ultrasound guidance.  Negative aspiration and negative test dose prior to incremental administration of local anesthetic. The patient tolerated the procedure well.

## 2022-07-08 NOTE — Interval H&P Note (Signed)
History and Physical Interval Note:  07/08/2022 8:45 AM  [name]  has presented today for surgery, with the diagnosis of Left knee osteoarthritis.  The various methods of treatment have been discussed with the patient and family. After consideration of risks, benefits and other options for treatment, the patient has consented to  Procedure(s): TOTAL KNEE ARTHROPLASTY (Left) as a surgical intervention.  The patient's history has been reviewed, patient examined, no change in status, stable for surgery.  I have reviewed the patient's chart and labs.  Questions were answered to the patient's satisfaction.     [name]

## 2022-07-09 ENCOUNTER — Encounter (HOSPITAL_COMMUNITY): Payer: Self-pay | Admitting: Orthopedic Surgery

## 2022-07-09 DIAGNOSIS — Z87891 Personal history of nicotine dependence: Secondary | ICD-10-CM | POA: Diagnosis not present

## 2022-07-09 DIAGNOSIS — M1712 Unilateral primary osteoarthritis, left knee: Secondary | ICD-10-CM | POA: Diagnosis not present

## 2022-07-09 DIAGNOSIS — E039 Hypothyroidism, unspecified: Secondary | ICD-10-CM | POA: Diagnosis not present

## 2022-07-09 DIAGNOSIS — Z853 Personal history of malignant neoplasm of breast: Secondary | ICD-10-CM | POA: Diagnosis not present

## 2022-07-09 DIAGNOSIS — R531 Weakness: Secondary | ICD-10-CM | POA: Diagnosis not present

## 2022-07-09 DIAGNOSIS — Z7401 Bed confinement status: Secondary | ICD-10-CM | POA: Diagnosis not present

## 2022-07-09 DIAGNOSIS — I110 Hypertensive heart disease with heart failure: Secondary | ICD-10-CM | POA: Diagnosis not present

## 2022-07-09 DIAGNOSIS — F0390 Unspecified dementia without behavioral disturbance: Secondary | ICD-10-CM | POA: Diagnosis not present

## 2022-07-09 DIAGNOSIS — R410 Disorientation, unspecified: Secondary | ICD-10-CM | POA: Diagnosis not present

## 2022-07-09 DIAGNOSIS — I5032 Chronic diastolic (congestive) heart failure: Secondary | ICD-10-CM | POA: Diagnosis not present

## 2022-07-09 DIAGNOSIS — Z79899 Other long term (current) drug therapy: Secondary | ICD-10-CM | POA: Diagnosis not present

## 2022-07-09 LAB — BASIC METABOLIC PANEL
Anion gap: 7 (ref 5–15)
BUN: 24 mg/dL — ABNORMAL HIGH (ref 8–23)
CO2: 25 mmol/L (ref 22–32)
Calcium: 8.8 mg/dL — ABNORMAL LOW (ref 8.9–10.3)
Chloride: 108 mmol/L (ref 98–111)
Creatinine, Ser: 0.78 mg/dL (ref 0.44–1.00)
GFR, Estimated: >60 mL/min (ref >60)
Glucose, Bld: 176 mg/dL — ABNORMAL HIGH (ref 70–99)
Potassium: 4 mmol/L (ref 3.5–5.1)
Sodium: 140 mmol/L (ref 135–145)

## 2022-07-09 LAB — CBC
HCT: 33.6 % — ABNORMAL LOW (ref 36.0–46.0)
Hemoglobin: 11.1 g/dL — ABNORMAL LOW (ref 12.0–15.0)
MCH: 31.6 pg (ref 26.0–34.0)
MCHC: 33 g/dL (ref 30.0–36.0)
MCV: 95.7 fL (ref 80.0–100.0)
Platelets: 178 10*3/uL (ref 150–400)
RBC: 3.51 MIL/uL — ABNORMAL LOW (ref 3.87–5.11)
RDW: 12.9 % (ref 11.5–15.5)
WBC: 9.8 10*3/uL (ref 4.0–10.5)
nRBC: 0 % (ref 0.0–0.2)

## 2022-07-09 MED ORDER — ACETAMINOPHEN 500 MG PO TABS: 1000.0000 mg | ORAL_TABLET | Freq: Three times a day (TID) | ORAL | 0 refills | Status: DC

## 2022-07-09 MED ORDER — ASPIRIN 81 MG PO CHEW: 81.0000 mg | CHEWABLE_TABLET | Freq: Two times a day (BID) | ORAL | 0 refills | Status: DC

## 2022-07-09 MED ORDER — OXYCODONE HCL 5 MG PO TABS: 5.0000 mg | ORAL_TABLET | ORAL | 0 refills | Status: DC | PRN

## 2022-07-09 MED ORDER — DOCUSATE SODIUM 100 MG PO CAPS
100.0000 mg | ORAL_CAPSULE | Freq: Two times a day (BID) | ORAL | 0 refills | Status: AC
Start: 2022-07-09 — End: ?

## 2022-07-09 MED ORDER — OXYCODONE HCL 5 MG PO TABS
5.0000 mg | ORAL_TABLET | ORAL | 0 refills | Status: DC | PRN
Start: 2022-07-09 — End: 2023-02-09

## 2022-07-09 MED ORDER — METHOCARBAMOL 500 MG PO TABS
500.0000 mg | ORAL_TABLET | Freq: Four times a day (QID) | ORAL | 0 refills | Status: DC | PRN
Start: 2022-07-09 — End: 2022-07-09

## 2022-07-09 MED ORDER — METHOCARBAMOL 500 MG PO TABS
500.0000 mg | ORAL_TABLET | Freq: Four times a day (QID) | ORAL | 0 refills | Status: DC | PRN
Start: 2022-07-09 — End: 2023-03-19

## 2022-07-09 MED ORDER — ASPIRIN 81 MG PO CHEW: 81.0000 mg | CHEWABLE_TABLET | Freq: Two times a day (BID) | ORAL | 0 refills | Status: AC

## 2022-07-09 MED ORDER — POLYETHYLENE GLYCOL 3350 17 G PO PACK: 17.0000 g | PACK | Freq: Every day | ORAL | 0 refills | Status: DC | PRN

## 2022-07-09 MED ORDER — POLYETHYLENE GLYCOL 3350 17 G PO PACK
17.0000 g | PACK | Freq: Every day | ORAL | 0 refills | Status: AC | PRN
Start: 2022-07-09 — End: ?

## 2022-07-09 MED ORDER — DOCUSATE SODIUM 100 MG PO CAPS: 100.0000 mg | ORAL_CAPSULE | Freq: Two times a day (BID) | ORAL | 0 refills | Status: DC

## 2022-07-09 NOTE — Discharge Instructions (Signed)
INSTRUCTIONS AFTER JOINT REPLACEMENT   Please avoid using clonazepam/Klonopin while taking your pain medication, oxycodone, as these medications can increase sedation/overdose risk when taken together.   Remove items at home which could result in a fall. This includes throw rugs or furniture in walking pathways ICE to the affected joint every three hours while awake for 30 minutes at a time, for at least the first 3-5 days, and then as needed for pain and swelling.  Continue to use ice for pain and swelling. You may notice swelling that will progress down to the foot and ankle.  This is normal after surgery.  Elevate your leg when you are not up walking on it.   Continue to use the breathing machine you got in the hospital (incentive spirometer) which will help keep your temperature down.  It is common for your temperature to cycle up and down following surgery, especially at night when you are not up moving around and exerting yourself.  The breathing machine keeps your lungs expanded and your temperature down.   DIET:  As you were doing prior to hospitalization, we recommend a well-balanced diet.  DRESSING / WOUND CARE / SHOWERING  Keep the surgical dressing until follow up.  The dressing is water proof, so you can shower without any extra covering.  IF THE DRESSING FALLS OFF or the wound gets wet inside, change the dressing with sterile gauze.  Please use good hand washing techniques before changing the dressing.  Do not use any lotions or creams on the incision until instructed by your surgeon.    ACTIVITY  Increase activity slowly as tolerated, but follow the weight bearing instructions below.   No driving for 6 weeks or until further direction given by your physician.  You cannot drive while taking narcotics.  No lifting or carrying greater than 10 lbs. until further directed by your surgeon. Avoid periods of inactivity such as sitting longer than an hour when not asleep. This helps prevent  blood clots.  You may return to work once you are authorized by your doctor.     WEIGHT BEARING   Weight bearing as tolerated with assist device (walker, cane, etc) as directed, use it as long as suggested by your surgeon or therapist, typically at least 4-6 weeks.   EXERCISES  Results after joint replacement surgery are often greatly improved when you follow the exercise, range of motion and muscle strengthening exercises prescribed by your doctor. Safety measures are also important to protect the joint from further injury. Any time any of these exercises cause you to have increased pain or swelling, decrease what you are doing until you are comfortable again and then slowly increase them. If you have problems or questions, call your caregiver or physical therapist for advice.   Rehabilitation is important following a joint replacement. After just a few days of immobilization, the muscles of the leg can become weakened and shrink (atrophy).  These exercises are designed to build up the tone and strength of the thigh and leg muscles and to improve motion. Often times heat used for twenty to thirty minutes before working out will loosen up your tissues and help with improving the range of motion but do not use heat for the first two weeks following surgery (sometimes heat can increase post-operative swelling).   These exercises can be done on a training (exercise) mat, on the floor, on a table or on a bed. Use whatever works the best and is most comfortable for  you.    Use music or television while you are exercising so that the exercises are a pleasant break in your day. This will make your life better with the exercises acting as a break in your routine that you can look forward to.   Perform all exercises about fifteen times, three times per day or as directed.  You should exercise both the operative leg and the other leg as well.  Exercises include:   Quad Sets - Tighten up the muscle on the  front of the thigh (Quad) and hold for 5-10 seconds.   Straight Leg Raises - With your knee straight (if you were given a brace, keep it on), lift the leg to 60 degrees, hold for 3 seconds, and slowly lower the leg.  Perform this exercise against resistance later as your leg gets stronger.  Leg Slides: Lying on your back, slowly slide your foot toward your buttocks, bending your knee up off the floor (only go as far as is comfortable). Then slowly slide your foot back down until your leg is flat on the floor again.  Angel Wings: Lying on your back spread your legs to the side as far apart as you can without causing discomfort.  Hamstring Strength:  Lying on your back, push your heel against the floor with your leg straight by tightening up the muscles of your buttocks.  Repeat, but this time bend your knee to a comfortable angle, and push your heel against the floor.  You may put a pillow under the heel to make it more comfortable if necessary.   A rehabilitation program following joint replacement surgery can speed recovery and prevent re-injury in the future due to weakened muscles. Contact your doctor or a physical therapist for more information on knee rehabilitation.    CONSTIPATION  Constipation is defined medically as fewer than three stools per week and severe constipation as less than one stool per week.  Even if you have a regular bowel pattern at home, your normal regimen is likely to be disrupted due to multiple reasons following surgery.  Combination of anesthesia, postoperative narcotics, change in appetite and fluid intake all can affect your bowels.   YOU MUST use at least one of the following options; they are listed in order of increasing strength to get the job done.  They are all available over the counter, and you may need to use some, POSSIBLY even all of these options:    Drink plenty of fluids (prune juice may be helpful) and high fiber foods Colace 100 mg by mouth twice a day   Senokot for constipation as directed and as needed Dulcolax (bisacodyl), take with full glass of water  Miralax (polyethylene glycol) once or twice a day as needed.  If you have tried all these things and are unable to have a bowel movement in the first 3-4 days after surgery call either your surgeon or your primary doctor.    If you experience loose stools or diarrhea, hold the medications until you stool forms back up.  If your symptoms do not get better within 1 week or if they get worse, check with your doctor.  If you experience "the worst abdominal pain ever" or develop nausea or vomiting, please contact the office immediately for further recommendations for treatment.   ITCHING:  If you experience itching with your medications, try taking only a single pain pill, or even half a pain pill at a time.  You can also use  Benadryl over the counter for itching or also to help with sleep.   TED HOSE STOCKINGS:  Use stockings on both legs until for at least 2 weeks or as directed by physician office. They may be removed at night for sleeping.  MEDICATIONS:  See your medication summary on the "After Visit Summary" that nursing will review with you.  You may have some home medications which will be placed on hold until you complete the course of blood thinner medication.  It is important for you to complete the blood thinner medication as prescribed.  PRECAUTIONS:  If you experience chest pain or shortness of breath - call 911 immediately for transfer to the hospital emergency department.   If you develop a fever greater that 101 F, purulent drainage from wound, increased redness or drainage from wound, foul odor from the wound/dressing, or calf pain - CONTACT YOUR SURGEON.                                                   FOLLOW-UP APPOINTMENTS:  If you do not already have a post-op appointment, please call the office for an appointment to be seen by your surgeon.  Guidelines for how soon to be seen  are listed in your "After Visit Summary", but are typically between 1-4 weeks after surgery.  OTHER INSTRUCTIONS:   Knee Replacement:  Do not place pillow under knee, focus on keeping the knee straight while resting. CPM instructions: 0-90 degrees, 2 hours in the morning, 2 hours in the afternoon, and 2 hours in the evening. Place foam block, curve side up under heel at all times except when in CPM or when walking.  DO NOT modify, tear, cut, or change the foam block in any way.  POST-OPERATIVE OPIOID TAPER INSTRUCTIONS: It is important to wean off of your opioid medication as soon as possible. If you do not need pain medication after your surgery it is ok to stop day one. Opioids include: Codeine, Hydrocodone(Norco, Vicodin), Oxycodone(Percocet, oxycontin) and hydromorphone amongst others.  Long term and even short term use of opiods can cause: Increased pain response Dependence Constipation Depression Respiratory depression And more.  Withdrawal symptoms can include Flu like symptoms Nausea, vomiting And more Techniques to manage these symptoms Hydrate well Eat regular healthy meals Stay active Use relaxation techniques(deep breathing, meditating, yoga) Do Not substitute Alcohol to help with tapering If you have been on opioids for less than two weeks and do not have pain than it is ok to stop all together.  Plan to wean off of opioids This plan should start within one week post op of your joint replacement. Maintain the same interval or time between taking each dose and first decrease the dose.  Cut the total daily intake of opioids by one tablet each day Next start to increase the time between doses. The last dose that should be eliminated is the evening dose.   MAKE SURE YOU:  Understand these instructions.  Get help right away if you are not doing well or get worse.    Thank you for letting us be a part of your medical care team.  It is a privilege we respect greatly.  We  hope these instructions will help you stay on track for a fast and full recovery!

## 2022-07-09 NOTE — Discharge Summary (Signed)
Patient ID: [name] MRN: [id number] DOB/AGE: 82-Mar-1942 82 y.o.  Admit date: 07/08/2022 Discharge date: 07/09/22  Admission Diagnoses:  Left knee osteoarthritis  Discharge Diagnoses:  Principal Problem:   S/P total knee arthroplasty, left   Past Medical History:  Diagnosis Date   Adrenal insufficiency ([place]) 07/25/2021   Anxiety    Arrhythmia    right bundle branch block   Bipolar 1 disorder (HCC)    Cancer ([place])    right breast cancer   Depression    Dyspnea    with exertion   GERD (gastroesophageal reflux disease)    Headache    Heart murmur    Hyperlipidemia    Hypertension    Hypothyroidism    Morbid obesity ([place])    Osteoarthritis    Pneumonia    PONV (postoperative nausea and vomiting)    Schizo-affective schizophrenia ([place])    Thyroid disease    hypothyroidism    Surgeries: Procedure(s): TOTAL KNEE ARTHROPLASTY on 07/08/2022   Consultants:   Discharged Condition: Improved  Hospital Course: [name] is an 81 y.o. female who was admitted 07/08/2022 for operative treatment ofS/P total knee arthroplasty, left. Patient has severe unremitting pain that affects sleep, daily activities, and work/hobbies. After pre-op clearance the patient was taken to the operating room on 07/08/2022 and underwent  Procedure(s): TOTAL KNEE ARTHROPLASTY.    Patient was given perioperative antibiotics:  Anti-infectives (From admission, onward)    Start     Dose/Rate Route Frequency Ordered Stop   07/08/22 1600  ceFAZolin (ANCEF) IVPB 2g/100 mL premix        2 g 200 mL/hr over 30 Minutes Intravenous Every 6 hours 07/08/22 1253 07/08/22 2215   07/08/22 0745  ceFAZolin (ANCEF) IVPB 2g/100 mL premix        2 g 200 mL/hr over 30 Minutes Intravenous On call to O.R. 07/08/22 0741 07/08/22 1014        Patient was given sequential compression devices, early ambulation, and chemoprophylaxis to prevent DVT. Patient worked with PT and was meeting their goals regarding safe ambulation  and transfers.  Patient benefited maximally from hospital stay and there were no complications.    Recent vital signs: Patient Vitals for the past 24 hrs:  BP Temp Temp src Pulse Resp SpO2  07/09/22 0920 (!) 133/53 98.3 F (36.8 C) -- 88 16 96 %  07/09/22 0522 (!) 127/53 98.5 F (36.9 C) Oral 88 18 93 %  07/09/22 0338 (!) 116/51 98.5 F (36.9 C) Oral 84 17 95 %  07/08/22 2224 (!) 127/58 98.3 F (36.8 C) -- 90 18 93 %  07/08/22 1509 135/62 97.9 F (36.6 C) -- 71 18 98 %  07/08/22 1253 112/68 97.6 F (36.4 C) Oral 62 16 95 %     Recent laboratory studies:  Recent Labs    07/09/22 0250  WBC 9.8  HGB 11.1*  HCT 33.6*  PLT 178  NA 140  K 4.0  CL 108  CO2 25  BUN 24*  CREATININE 0.78  GLUCOSE 176*  CALCIUM 8.8*     Discharge Medications:   Allergies as of 07/09/2022       Reactions   Lithium Nausea Only   Fluoxetine Other (See Comments)   Pt felt crazy   Macrodantin Other (See Comments)   Unknown reaction   Mirabegron Other (See Comments)   ineffective   Nitrofurantoin Other (See Comments)   Other reaction(s): Did not agree with me   Paxil [paroxetine] Other (  See Comments)   Made pt feel crazy   Prednisone Other (See Comments)   Makes her feel very jittery   Prozac [fluoxetine Hcl] Other (See Comments)   Pt felt crazy   Sertraline Hcl Other (See Comments)   Unknown reaction   Solifenacin Other (See Comments)   Ineffective    Sulfa Antibiotics Other (See Comments)   Unknown reaction   Wellbutrin [bupropion Hcl] Other (See Comments)   Unknown reaction        Medication List     STOP taking these medications    clonazePAM 1 MG tablet Commonly known as: KLONOPIN   ibuprofen 200 MG tablet Commonly known as: ADVIL   loperamide 2 MG capsule Commonly known as: IMODIUM       TAKE these medications    acetaminophen 500 MG tablet Commonly known as: TYLENOL Take 2 tablets (1,000 mg total) by mouth every 8 (eight) hours.   albuterol 108 (90  Base) MCG/ACT inhaler Commonly known as: VENTOLIN HFA Inhale 2 puffs into the lungs every 6 (six) hours as needed for wheezing or shortness of breath.   amLODipine 2.5 MG tablet Commonly known as: NORVASC Take 2.5 mg by mouth at bedtime.   aspirin 81 MG chewable tablet Chew 1 tablet (81 mg total) by mouth 2 (two) times daily for 28 days.   atorvastatin 40 MG tablet Commonly known as: LIPITOR Take 40 mg by mouth at bedtime.   Biotin 10 MG Tabs Take 10,000 mcg by mouth every morning.   CALCIUM CARBONATE PO Take 600 mg by mouth every morning.   co-enzyme Q-10 30 MG capsule Take 30 mg by mouth daily.   docusate sodium 100 MG capsule Commonly known as: COLACE Take 1 capsule (100 mg total) by mouth 2 (two) times daily.   Fish Oil 1200 MG Caps Take 1,200 mg by mouth every morning.   gabapentin 100 MG capsule Commonly known as: NEURONTIN TAKE 2 CAPSULES BY MOUTH AT BEDTIME What changed:  how to take this when to take this   hydrocortisone 5 MG tablet Commonly known as: CORTEF Take 1 tablet (5 mg total) by mouth as directed. 2 tabs with breakfast, and 1 tablet in the afternoon What changed:  how much to take when to take this additional instructions   lamoTRIgine 200 MG tablet Commonly known as: LAMICTAL Take 200 mg by mouth at bedtime.   levothyroxine 125 MCG tablet Commonly known as: SYNTHROID Take 1 tablet (125 mcg total) by mouth as directed. 1.5 tablet on Sundays, 1 tablet Monday through Saturday What changed:  when to take this additional instructions   memantine 10 MG tablet Commonly known as: NAMENDA Take 10 mg by mouth 2 (two) times daily.   methocarbamol 500 MG tablet Commonly known as: ROBAXIN Take 1 tablet (500 mg total) by mouth every 6 (six) hours as needed for muscle spasms (muscle pain).   metoprolol tartrate 25 MG tablet Commonly known as: LOPRESSOR Take 25 mg by mouth 2 (two) times daily.   multivitamin with minerals Tabs tablet Take 1  tablet by mouth every morning.   OLANZapine 15 MG tablet Commonly known as: ZYPREXA Take 30 mg by mouth at bedtime.   omeprazole 40 MG capsule Commonly known as: PRILOSEC Take 40 mg by mouth in the morning.   oxyCODONE 5 MG immediate release tablet Commonly known as: Oxy IR/ROXICODONE Take 1 tablet (5 mg total) by mouth every 4 (four) hours as needed for severe pain.   polyethylene glycol 17  g packet Commonly known as: MIRALAX / GLYCOLAX Take 17 g by mouth daily as needed for mild constipation.   polyvinyl alcohol 1.4 % ophthalmic solution Commonly known as: LIQUIFILM TEARS Place 1 drop into both eyes as needed for dry eyes.   ramelteon 8 MG tablet Commonly known as: ROZEREM Take 8 mg by mouth at bedtime.   rosuvastatin 20 MG tablet Commonly known as: CRESTOR Take 1 tablet (20 mg total) by mouth daily. Need labs from PCP   spironolactone 25 MG tablet Commonly known as: ALDACTONE TAKE 1 TABLET (25 MG TOTAL) BY MOUTH DAILY.   traZODone 50 MG tablet Commonly known as: DESYREL Take 150 mg by mouth at bedtime.   VITAMIN B-12 PO Take 2,500 mcg by mouth every morning.   VITAMIN C PO Take 500 mg by mouth every morning.   Vitamin D 50 MCG (2000 UT) tablet Take 2,000 Units by mouth daily.               Discharge Care Instructions  (From admission, onward)           Start     Ordered   07/09/22 0000  Change dressing       Comments: Maintain surgical dressing until follow up in the clinic. If the edges start to pull up, may reinforce with tape. If the dressing is no longer working, may remove and cover with gauze and tape, but must keep the area dry and clean.  Call with any questions or concerns.   07/09/22 0854            Diagnostic Studies: No results found.  Disposition: Discharge disposition: 01-Home or Self Care       Discharge Instructions     Call MD / Call 911   Complete by: As directed    If you experience chest pain or shortness of  breath, CALL 911 and be transported to the hospital emergency room.  If you develope a fever above 101 F, pus (white drainage) or increased drainage or redness at the wound, or calf pain, call your surgeon's office.   Change dressing   Complete by: As directed    Maintain surgical dressing until follow up in the clinic. If the edges start to pull up, may reinforce with tape. If the dressing is no longer working, may remove and cover with gauze and tape, but must keep the area dry and clean.  Call with any questions or concerns.   Constipation Prevention   Complete by: As directed    Drink plenty of fluids.  Prune juice may be helpful.  You may use a stool softener, such as Colace (over the counter) 100 mg twice a day.  Use MiraLax (over the counter) for constipation as needed.   Diet - low sodium heart healthy   Complete by: As directed    Increase activity slowly as tolerated   Complete by: As directed    Weight bearing as tolerated with assist device (walker, cane, etc) as directed, use it as long as suggested by your surgeon or therapist, typically at least 4-6 weeks.   Post-operative opioid taper instructions:   Complete by: As directed    POST-OPERATIVE OPIOID TAPER INSTRUCTIONS: It is important to wean off of your opioid medication as soon as possible. If you do not need pain medication after your surgery it is ok to stop day one. Opioids include: Codeine, Hydrocodone(Norco, Vicodin), Oxycodone(Percocet, oxycontin) and hydromorphone amongst others.  Long term and even short  term use of opiods can cause: Increased pain response Dependence Constipation Depression Respiratory depression And more.  Withdrawal symptoms can include Flu like symptoms Nausea, vomiting And more Techniques to manage these symptoms Hydrate well Eat regular healthy meals Stay active Use relaxation techniques(deep breathing, meditating, yoga) Do Not substitute Alcohol to help with tapering If you have  been on opioids for less than two weeks and do not have pain than it is ok to stop all together.  Plan to wean off of opioids This plan should start within one week post op of your joint replacement. Maintain the same interval or time between taking each dose and first decrease the dose.  Cut the total daily intake of opioids by one tablet each day Next start to increase the time between doses. The last dose that should be eliminated is the evening dose.      TED hose   Complete by: As directed    Use stockings (TED hose) for 2 weeks on both leg(s).  You may remove them at night for sleeping.        Follow-up Information     Paralee Cancel, MD. Go on 07/23/2022.   Specialty: Orthopedic Surgery Why: You are scheduled for a follow up appointment on 07-23-22 at 10:15 am. Contact information: [address] [place] [address] [phone]                  Signed: Irving Copas 07/09/2022, 12:52 PM

## 2022-07-09 NOTE — Progress Notes (Signed)
Physical Therapy Treatment Patient Details Name: [name] MRN: [id number] DOB: [birth year] Today's Date: 07/09/2022   History of Present Illness Pt is an 81yo female presenting s/p L-TKA on 07/08/22. PMH: R-BBB, Bipolar, hx of breast cancer, anxiety and depression, GERD, HLD, HTN, schizophrenia, hypothyroidism, CKD3 adrenal insufficiency, hx of NSTEMI, CHF, dementia    PT Comments    POD # 1 am session General Comments: AxO x 3 very pleasant Retired Merchandiser, retail from [place] currently living at [facility] past 18 years. Pt already OOB in recliner.  General transfer comment: increased time but self able to rise by pulling up on walker.  "this is how I do it at home". General Gait Details: limited distance 18 feet due to left knee pain and effort. Then returned to room to perform some TE's following HEP handout.  Instructed on proper tech, freq as well as use of ICE.   Addressed all mobility questions, discussed appropriate activity, educated on use of ICE.  Pt ready for D/C to home. Pt plans to return to her Indep apartment with 24/7 care through [facility] and [facility] PT through [place].  Recommendations for follow up therapy are one component of a multi-disciplinary discharge planning process, led by the attending physician.  Recommendations may be updated based on patient status, additional functional criteria and insurance authorization.  Follow Up Recommendations  Follow physician's recommendations for discharge plan and follow up therapies     Assistance Recommended at Discharge Intermittent Supervision/Assistance  Patient can return home with the following A little help with walking and/or transfers;A little help with bathing/dressing/bathroom;Assistance with cooking/housework;Assist for transportation;Help with stairs or ramp for entrance   Equipment Recommendations  Rolling walker (2 wheels) (youth)    Recommendations for Other Services       Precautions / Restrictions  Precautions Precautions: Fall     Mobility  Bed Mobility               General bed mobility comments: OOB in recliner    Transfers Overall transfer level: Needs assistance Equipment used: Rolling walker (2 wheels) Transfers: Sit to/from Stand Sit to Stand: Supervision, Min guard           General transfer comment: increased time but self able to rise by pulling up on walker.  "this is how I do it at home".    Ambulation/Gait Ambulation/Gait assistance: Supervision, Min guard Gait Distance (Feet): 18 Feet Assistive device: Rolling walker (2 wheels) Gait Pattern/deviations: Step-to pattern, Decreased stance time - left Gait velocity: decreased     General Gait Details: limited distance 18 feet due to left knee pain and effort.   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        Exercises  Total Knee Replacement TE's following HEP handout 10 reps B LE ankle pumps 05 reps towel squeezes 05 reps knee presses 05 reps heel slides  05 reps SAQ's 05 reps SLR's 05 reps ABD Educated on use of gait belt to assist with TE's Followed by ICE     General Comments        Pertinent Vitals/Pain Pain Assessment Pain Assessment: 0-10 Pain Score: 5  Pain Location: Left knee Pain Descriptors / Indicators: Operative site guarding, Tender Pain Intervention(s): Monitored during session, Premedicated before session, Repositioned, Ice applied    Home Living                          Prior  Function            PT Goals (current goals can now be found in the care plan section) Progress towards PT goals: Progressing toward goals    Frequency    7X/week      PT Plan Current plan remains appropriate    Co-evaluation              AM-PAC PT "6 Clicks" Mobility   Outcome Measure  Help needed turning from your back to your side while in a flat bed without using bedrails?: A Little Help needed moving from lying on your back to sitting on the side of a flat bed without using bedrails?: A Little Help needed moving to and from a bed to a chair (including a wheelchair)?: A Little Help needed standing up from a chair using your arms (e.g., wheelchair or bedside chair)?: A Little Help needed to walk in hospital room?: A Little Help needed climbing 3-5 steps with a railing? : A Lot 6 Click Score: 17    End of Session Equipment Utilized During Treatment: Gait belt Activity Tolerance: Patient limited by pain Patient left: in chair;with call bell/phone within reach;with chair alarm set;with family/visitor present;with SCD's reapplied Nurse Communication: Mobility status PT Visit Diagnosis: Pain;Difficulty in walking, not elsewhere classified (R26.2) Pain - Right/Left: Left Pain - part of body: Knee     Time: 1000-1040 PT Time Calculation (min) (ACUTE ONLY): 40 min  Charges:  $Gait Training: 8-22 mins $Therapeutic Exercise: 8-22 mins $Therapeutic Activity: 8-22 mins                     {Leia Coletti  PTA Acute  Rehabilitation Services Office M-F          [phone] Weekend pager [phone]

## 2022-07-09 NOTE — TOC Transition Note (Signed)
Transition of Care [facility]) - CM/SW Discharge Note  Patient Details  Name: PHYLICIA MCGAUGH MRN: [id number] Date of Birth: 1941/02/29  [facility] CM/SW Contact:  Sherie Don, [place] Phone Number: 07/09/2022, 1:59 PM  Clinical Narrative: Patient is expected to discharge back to [facility] after working with PT. CSW met with patient to confirm discharge plan and needs. Patient will go back to her ILF with [facility] through [facility]. Patient has a wheelchair at home, but will need a youth rolling walker which [employer] delivered to patient's room. Patient's friend to transport DME back to patient's apartment.  Patient will go back to her ILF with [facility] through [facility]. CSW spoke with Rollene Fare with [facility] and confirmed the agency will provide [facility] to the patient at [place]. Rollene Fare requested that [facility] orders and discharge summary be faxed to [phone]. CSW faxed requested documentation to [facility].  Patient will need to be transported back to the ILF via [facility]. Medical necessity form done; [facility] scheduled. RN updated. TOC signing off.  Final next level of care: [place] Barriers to Discharge: No Barriers Identified  Patient Goals and CMS Choice Patient states their goals for this hospitalization and ongoing recovery are:: Get HHPT through [facility] at [place] Medicare.gov Compare Post Acute Care list provided to:: Patient Choice offered to / list presented to : Patient  Discharge Plan and Services         DME Arranged: Gilford Rile youth DME Agency: [employer] Representative spoke with at DME Agency: Prearranged in orthopedist's office HH Arranged: PT [place] Agency: Other - See comment Secondary school teacher)  Readmission Risk Interventions     No data to display

## 2022-07-09 NOTE — Progress Notes (Addendum)
Discharge instructions reviewed with patient and caregiver at bedside. All questions and concerns addressed. Patient and caregiver aware that rolling walker and wheelchair must be taken by family, caregiver taking wheelchair and rolling walker. All other belongings sent with patient. IV removed per protocol, tolerated well, intact. Vital signs stable with no s/sx of distress or discomfort noted. Joelene Millin at [employer] [phone] made aware of patients departure.

## 2022-07-09 NOTE — Plan of Care (Signed)
Plan of care reviewed and discussed with the patient and friend.

## 2022-07-09 NOTE — Progress Notes (Signed)
Patient ID: [name], female   DOB: 04/25/1941, 81 y.o.   MRN: [id number] Subjective: 1 Day Post-Op Procedure(s) (LRB): TOTAL KNEE ARTHROPLASTY (Left)    Patient reports pain as mild to moderate No specific concerns No events other than limited sleep last night  Objective:   VITALS:   Vitals:   07/09/22 0338 07/09/22 0522  BP: (!) 116/51 (!) 127/53  Pulse: 84 88  Resp: 17 18  Temp: 98.5 F (36.9 C) 98.5 F (36.9 C)  SpO2: 95% 93%    Neurovascular intact Incision: dressing C/D/I  LABS Recent Labs    07/09/22 0250  HGB 11.1*  HCT 33.6*  WBC 9.8  PLT 178    Recent Labs    07/09/22 0250  NA 140  K 4.0  BUN 24*  CREATININE 0.78  GLUCOSE 176*    No results for input(s): "LABPT", "INR" in the last 72 hours.   Assessment/Plan: 1 Day Post-Op Procedure(s) (LRB): TOTAL KNEE ARTHROPLASTY (Left)   Advance diet Up with therapy Reviewed post op expectations and challenges She states she has a plan in place with help and PT at her place of residence Goals reviewed RTC in 2 weeks

## 2022-07-10 DIAGNOSIS — Z96652 Presence of left artificial knee joint: Secondary | ICD-10-CM | POA: Diagnosis not present

## 2022-07-10 DIAGNOSIS — M6281 Muscle weakness (generalized): Secondary | ICD-10-CM | POA: Diagnosis not present

## 2022-07-10 DIAGNOSIS — M25562 Pain in left knee: Secondary | ICD-10-CM | POA: Diagnosis not present

## 2022-07-10 DIAGNOSIS — R262 Difficulty in walking, not elsewhere classified: Secondary | ICD-10-CM | POA: Diagnosis not present

## 2022-07-11 ENCOUNTER — Emergency Department (HOSPITAL_COMMUNITY): Payer: Medicare HMO

## 2022-07-11 ENCOUNTER — Emergency Department (HOSPITAL_COMMUNITY)
Admission: EM | Admit: 2022-07-11 | Discharge: 2022-07-11 | Disposition: A | Discharge status: Skilled Nursing Facility | Payer: Medicare HMO | Attending: Emergency Medicine | Admitting: Emergency Medicine

## 2022-07-11 DIAGNOSIS — Z96652 Presence of left artificial knee joint: Secondary | ICD-10-CM | POA: Diagnosis not present

## 2022-07-11 DIAGNOSIS — Z7982 Long term (current) use of aspirin: Secondary | ICD-10-CM | POA: Insufficient documentation

## 2022-07-11 DIAGNOSIS — R29818 Other symptoms and signs involving the nervous system: Secondary | ICD-10-CM | POA: Diagnosis not present

## 2022-07-11 DIAGNOSIS — Z79899 Other long term (current) drug therapy: Secondary | ICD-10-CM | POA: Insufficient documentation

## 2022-07-11 DIAGNOSIS — Z20822 Contact with and (suspected) exposure to covid-19: Secondary | ICD-10-CM | POA: Diagnosis not present

## 2022-07-11 DIAGNOSIS — R262 Difficulty in walking, not elsewhere classified: Secondary | ICD-10-CM | POA: Diagnosis not present

## 2022-07-11 DIAGNOSIS — Z853 Personal history of malignant neoplasm of breast: Secondary | ICD-10-CM | POA: Diagnosis not present

## 2022-07-11 DIAGNOSIS — I959 Hypotension, unspecified: Secondary | ICD-10-CM | POA: Diagnosis not present

## 2022-07-11 DIAGNOSIS — R0902 Hypoxemia: Secondary | ICD-10-CM | POA: Diagnosis not present

## 2022-07-11 DIAGNOSIS — R4020 Unspecified coma: Secondary | ICD-10-CM | POA: Diagnosis not present

## 2022-07-11 DIAGNOSIS — E039 Hypothyroidism, unspecified: Secondary | ICD-10-CM | POA: Diagnosis not present

## 2022-07-11 DIAGNOSIS — Z7401 Bed confinement status: Secondary | ICD-10-CM | POA: Diagnosis not present

## 2022-07-11 DIAGNOSIS — J9811 Atelectasis: Secondary | ICD-10-CM | POA: Diagnosis not present

## 2022-07-11 DIAGNOSIS — R4781 Slurred speech: Secondary | ICD-10-CM | POA: Insufficient documentation

## 2022-07-11 DIAGNOSIS — R4182 Altered mental status, unspecified: Secondary | ICD-10-CM | POA: Diagnosis not present

## 2022-07-11 DIAGNOSIS — I10 Essential (primary) hypertension: Secondary | ICD-10-CM | POA: Insufficient documentation

## 2022-07-11 DIAGNOSIS — M25562 Pain in left knee: Secondary | ICD-10-CM | POA: Diagnosis not present

## 2022-07-11 DIAGNOSIS — R0602 Shortness of breath: Secondary | ICD-10-CM | POA: Diagnosis not present

## 2022-07-11 DIAGNOSIS — M6281 Muscle weakness (generalized): Secondary | ICD-10-CM | POA: Diagnosis not present

## 2022-07-11 DIAGNOSIS — R42 Dizziness and giddiness: Secondary | ICD-10-CM | POA: Diagnosis not present

## 2022-07-11 DIAGNOSIS — I4510 Unspecified right bundle-branch block: Secondary | ICD-10-CM | POA: Diagnosis not present

## 2022-07-11 LAB — COMPREHENSIVE METABOLIC PANEL
ALT: 13 U/L (ref 0–44)
AST: 13 U/L — ABNORMAL LOW (ref 15–41)
Albumin: 3 g/dL — ABNORMAL LOW (ref 3.5–5.0)
Alkaline Phosphatase: 36 U/L — ABNORMAL LOW (ref 38–126)
Anion gap: 10 (ref 5–15)
BUN: 14 mg/dL (ref 8–23)
CO2: 25 mmol/L (ref 22–32)
Calcium: 8.9 mg/dL (ref 8.9–10.3)
Chloride: 103 mmol/L (ref 98–111)
Creatinine, Ser: 0.72 mg/dL (ref 0.44–1.00)
GFR, Estimated: >60 mL/min (ref >60)
Glucose, Bld: 97 mg/dL (ref 70–99)
Potassium: 4.5 mmol/L (ref 3.5–5.1)
Sodium: 138 mmol/L (ref 135–145)
Total Bilirubin: 1 mg/dL (ref 0.3–1.2)
Total Protein: 5.9 g/dL — ABNORMAL LOW (ref 6.5–8.1)

## 2022-07-11 LAB — I-STAT CHEM 8, ED
BUN: 14 mg/dL (ref 8–23)
Calcium, Ion: 1.14 mmol/L — ABNORMAL LOW (ref 1.15–1.40)
Chloride: 100 mmol/L (ref 98–111)
Creatinine, Ser: 0.7 mg/dL (ref 0.44–1.00)
Glucose, Bld: 94 mg/dL (ref 70–99)
HCT: 31 % — ABNORMAL LOW (ref 36.0–46.0)
Hemoglobin: 10.5 g/dL — ABNORMAL LOW (ref 12.0–15.0)
Potassium: 4.4 mmol/L (ref 3.5–5.1)
Sodium: 137 mmol/L (ref 135–145)
TCO2: 28 mmol/L (ref 22–32)

## 2022-07-11 LAB — PROTIME-INR
INR: 1.3 — ABNORMAL HIGH (ref 0.8–1.2)
Prothrombin Time: 15.8 seconds — ABNORMAL HIGH (ref 11.4–15.2)

## 2022-07-11 LAB — ETHANOL: Alcohol, Ethyl (B): <10 mg/dL (ref <10)

## 2022-07-11 LAB — RESP PANEL BY RT-PCR (FLU A&B, COVID) ARPGX2
Influenza A by PCR: NEGATIVE
Influenza B by PCR: NEGATIVE
SARS Coronavirus 2 by RT PCR: NEGATIVE

## 2022-07-11 LAB — DIFFERENTIAL
Abs Immature Granulocytes: 0.03 10*3/uL (ref 0.00–0.07)
Basophils Absolute: 0 10*3/uL (ref 0.0–0.1)
Basophils Relative: 0 %
Eosinophils Absolute: 0.1 10*3/uL (ref 0.0–0.5)
Eosinophils Relative: 1 %
Immature Granulocytes: 0 %
Lymphocytes Relative: 30 %
Lymphs Abs: 3.1 10*3/uL (ref 0.7–4.0)
Monocytes Absolute: 1.7 10*3/uL — ABNORMAL HIGH (ref 0.1–1.0)
Monocytes Relative: 17 %
Neutro Abs: 5.3 10*3/uL (ref 1.7–7.7)
Neutrophils Relative %: 52 %

## 2022-07-11 LAB — CBG MONITORING, ED: Glucose-Capillary: 96 mg/dL (ref 70–99)

## 2022-07-11 LAB — CBC
HCT: 32.3 % — ABNORMAL LOW (ref 36.0–46.0)
Hemoglobin: 10.5 g/dL — ABNORMAL LOW (ref 12.0–15.0)
MCH: 32 pg (ref 26.0–34.0)
MCHC: 32.5 g/dL (ref 30.0–36.0)
MCV: 98.5 fL (ref 80.0–100.0)
Platelets: 145 10*3/uL — ABNORMAL LOW (ref 150–400)
RBC: 3.28 MIL/uL — ABNORMAL LOW (ref 3.87–5.11)
RDW: 13.6 % (ref 11.5–15.5)
WBC: 10.3 10*3/uL (ref 4.0–10.5)
nRBC: 0 % (ref 0.0–0.2)

## 2022-07-11 LAB — APTT: aPTT: 31 seconds (ref 24–36)

## 2022-07-11 MED ORDER — IOHEXOL 350 MG/ML SOLN
55.0000 mL | Freq: Once | INTRAVENOUS | Status: AC | PRN
  Administered 2022-07-11: 55 mL via INTRAVENOUS

## 2022-07-11 NOTE — Discharge Instructions (Addendum)
You were seen today for an episode of shortness of breath and some slurred speech.  You recovered fully by my evaluation.  Your work-up today is reassuring.  Make sure you continue your incentive spirometer to prevent pneumonia and atelectasis given your recent surgery.  Only use your pain and muscle relaxer as needed.  This can increase somnolence and create breathing issues.  If you develop fevers, any new or worsening symptoms, you should be reevaluated.

## 2022-07-11 NOTE — ED Triage Notes (Signed)
BIB EMS per facility patient was hard to wake up and when she woke up the patient had slurred speech

## 2022-07-11 NOTE — ED Provider Notes (Signed)
[facility] EMERGENCY DEPARTMENT Provider Note   CSN: [id number] Arrival date & time: 07/11/22  0101  An emergency department physician performed an initial assessment on this suspected stroke patient at [facility].  History  Chief Complaint  Patient presents with   Aphasia    [name] is a 81 y.o. female.  HPI     This is an 81 year old female who presents with an episode of slurred speech and altered mental status.  Patient was discharged from the hospital yesterday after having a left knee replacement.  She was at home with her caregivers.  She was last seen normal around 7:45 PM.  Per caregivers, she had slurred speech.  Upon EMS arrival they noted slurred speech and called a code stroke.  She was evaluated by neurology upon arrival.  She had no focal deficits at that time.  Additionally, EMS noted that her O2 sats were in the mid 70s.  Patient states that she did feel short of breath at that time but no longer feels short of breath.  She is requesting to go home.  She denies any symptoms at this time.  She is able to provide history.  She does report that she was started on oxycodone today as an outpatient.  This is a new medication for her.  She states that it does make her feel quite sleepy.  Home Medications Prior to Admission medications   Medication Sig Start Date End Date Taking? Authorizing Provider  acetaminophen (TYLENOL) 500 MG tablet Take 2 tablets (1,000 mg total) by mouth every 8 (eight) hours. 07/09/22   Irving Copas, PA-C  albuterol (VENTOLIN HFA) 108 (90 Base) MCG/ACT inhaler Inhale 2 puffs into the lungs every 6 (six) hours as needed for wheezing or shortness of breath. Patient not taking: Reported on 03/04/2022 03/01/22   Damita Lack, MD  amLODipine (NORVASC) 2.5 MG tablet Take 2.5 mg by mouth at bedtime.    [provider]  Ascorbic Acid (VITAMIN C PO) Take 500 mg by mouth every morning.    [provider]  aspirin 81 MG  chewable tablet Chew 1 tablet (81 mg total) by mouth 2 (two) times daily for 28 days. 07/09/22 08/06/22  Irving Copas, PA-C  atorvastatin (LIPITOR) 40 MG tablet Take 40 mg by mouth at bedtime.    [provider]  Biotin 10 MG TABS Take 10,000 mcg by mouth every morning.    [provider]  Calcium Carbonate Antacid (CALCIUM CARBONATE PO) Take 600 mg by mouth every morning.    [provider]  Cholecalciferol (VITAMIN D) 50 MCG (2000 UT) tablet Take 2,000 Units by mouth daily.    [provider]  co-enzyme Q-10 30 MG capsule Take 30 mg by mouth daily.    [provider]  Cyanocobalamin (VITAMIN B-12 PO) Take 2,500 mcg by mouth every morning.    [provider]  docusate sodium (COLACE) 100 MG capsule Take 1 capsule (100 mg total) by mouth 2 (two) times daily. 07/09/22   Irving Copas, PA-C  gabapentin (NEURONTIN) 100 MG capsule TAKE 2 CAPSULES BY MOUTH AT BEDTIME Patient taking differently: 200 mg 3 (three) times daily. 01/24/22   Pieter Partridge, DO  hydrocortisone (CORTEF) 5 MG tablet Take 1 tablet (5 mg total) by mouth as directed. 2 tabs with breakfast, and 1 tablet in the afternoon Patient taking differently: Take 10 mg by mouth daily. 03/20/22   Shamleffer, Melanie Crazier, MD  lamoTRIgine (  LAMICTAL) 200 MG tablet Take 200 mg by mouth at bedtime. 04/06/16   [provider]  levothyroxine (SYNTHROID) 125 MCG tablet Take 1 tablet (125 mcg total) by mouth as directed. 1.5 tablet on Sundays, 1 tablet Monday through Saturday Patient taking differently: Take 125 mcg by mouth daily before breakfast. 10/11/21   Shamleffer, Melanie Crazier, MD  memantine (NAMENDA) 10 MG tablet Take 10 mg by mouth 2 (two) times daily. 06/21/20   [provider]  methocarbamol (ROBAXIN) 500 MG tablet Take 1 tablet (500 mg total) by mouth every 6 (six) hours as needed for muscle spasms (muscle pain). 07/09/22   Irving Copas, PA-C  metoprolol tartrate  (LOPRESSOR) 25 MG tablet Take 25 mg by mouth 2 (two) times daily.    [provider]  Multiple Vitamin (MULTIVITAMIN WITH MINERALS) TABS tablet Take 1 tablet by mouth every morning.    [provider]  OLANZapine (ZYPREXA) 15 MG tablet Take 30 mg by mouth at bedtime.    [provider]  Omega-3 Fatty Acids (FISH OIL) 1200 MG CAPS Take 1,200 mg by mouth every morning.    [provider]  omeprazole (PRILOSEC) 40 MG capsule Take 40 mg by mouth in the morning.    [provider]  oxyCODONE (OXY IR/ROXICODONE) 5 MG immediate release tablet Take 1 tablet (5 mg total) by mouth every 4 (four) hours as needed for severe pain. 07/09/22   Irving Copas, PA-C  polyethylene glycol (MIRALAX / GLYCOLAX) 17 g packet Take 17 g by mouth daily as needed for mild constipation. 07/09/22   Irving Copas, PA-C  polyvinyl alcohol (LIQUIFILM TEARS) 1.4 % ophthalmic solution Place 1 drop into both eyes as needed for dry eyes.    [provider]  ramelteon (ROZEREM) 8 MG tablet Take 8 mg by mouth at bedtime.    [provider]  rosuvastatin (CRESTOR) 20 MG tablet Take 1 tablet (20 mg total) by mouth daily. Need labs from PCP Patient not taking: Reported on 06/19/2022 05/30/22   Skeet Latch, MD  spironolactone (ALDACTONE) 25 MG tablet TAKE 1 TABLET (25 MG TOTAL) BY MOUTH DAILY. 03/13/22   Skeet Latch, MD  traZODone (DESYREL) 50 MG tablet Take 150 mg by mouth at bedtime.    [provider]      Allergies    Lithium, Fluoxetine, Macrodantin, Mirabegron, Nitrofurantoin, Paxil [paroxetine], Prednisone, Prozac [fluoxetine hcl], Sertraline hcl, Solifenacin, Sulfa antibiotics, and Wellbutrin [bupropion hcl]    Review of Systems   Review of Systems  Constitutional:  Negative for fever.  Respiratory:  Positive for shortness of breath.   Neurological:  Positive for speech difficulty.  All other systems reviewed and are negative.   Physical  Exam Updated Vital Signs BP (!) 123/50   Pulse 95   Temp 100.3 F (37.9 C) (Oral)   Resp (!) 23   Ht 1.575 m ('5\' 2"'$ )   Wt 84.4 kg   SpO2 96%   BMI 34.03 kg/m  Physical Exam Vitals and nursing note reviewed.  Constitutional:      Appearance: She is well-developed. She is obese. She is not ill-appearing.  HENT:     Head: Normocephalic and atraumatic.  Eyes:     Pupils: Pupils are equal, round, and reactive to light.  Cardiovascular:     Rate and Rhythm: Normal rate and regular rhythm.     Heart sounds: Normal heart sounds.  Pulmonary:     Effort: Pulmonary effort is normal. No respiratory  distress.     Breath sounds: No wheezing.  Abdominal:     Palpations: Abdomen is soft.     Tenderness: There is no abdominal tenderness.  Musculoskeletal:     Cervical back: Neck supple.     Comments: Dressing noted left lower extremity with swelling adjacent to the left knee  Skin:    General: Skin is warm and dry.  Neurological:     Mental Status: She is alert and oriented to person, place, and time.     Comments: Cranial nerves II through XII intact, 5 out of 5 strength in all extremities with the exception of the left lower extremity which is difficult to assess secondary to recent surgery, she appears to have good proximal strength, fluent speech  Psychiatric:        Mood and Affect: Mood normal.     ED Results / Procedures / Treatments   Labs (all labs ordered are listed, but only abnormal results are displayed) Labs Reviewed  CBC - Abnormal; Notable for the following components:      Result Value   RBC 3.28 (*)    Hemoglobin 10.5 (*)    HCT 32.3 (*)    Platelets 145 (*)    All other components within normal limits  DIFFERENTIAL - Abnormal; Notable for the following components:   Monocytes Absolute 1.7 (*)    All other components within normal limits  COMPREHENSIVE METABOLIC PANEL - Abnormal; Notable for the following components:   Total Protein 5.9 (*)    Albumin 3.0  (*)    AST 13 (*)    Alkaline Phosphatase 36 (*)    All other components within normal limits  PROTIME-INR - Abnormal; Notable for the following components:   Prothrombin Time 15.8 (*)    INR 1.3 (*)    All other components within normal limits  I-STAT CHEM 8, ED - Abnormal; Notable for the following components:   Calcium, Ion 1.14 (*)    Hemoglobin 10.5 (*)    HCT 31.0 (*)    All other components within normal limits  RESP PANEL BY RT-PCR (FLU A&B, COVID) ARPGX2  ETHANOL  APTT  RAPID URINE DRUG SCREEN, HOSP PERFORMED  URINALYSIS, ROUTINE W REFLEX MICROSCOPIC  CBG MONITORING, ED    EKG EKG Interpretation  Date/Time:  Friday July 11 2022 01:22:33 EDT Ventricular Rate:  80 PR Interval:  149 QRS Duration: 126 QT Interval:  413 QTC Calculation: 477 R Axis:   49 Text Interpretation: Sinus rhythm Right bundle branch block Abnormal T, consider ischemia, lateral leads Confirmed by Thayer Jew [phone]) on 07/11/2022 3:03:31 AM  Radiology CT Angio Chest PE W and/or Wo Contrast  Result Date: 07/11/2022 CLINICAL DATA:  Rule out pulmonary embolism. Aphasia. Recent knee replacement. EXAM: CT ANGIOGRAPHY CHEST WITH CONTRAST TECHNIQUE: Multidetector CT imaging of the chest was performed using the standard protocol during bolus administration of intravenous contrast. Multiplanar CT image reconstructions and MIPs were obtained to evaluate the vascular anatomy. RADIATION DOSE REDUCTION: This exam was performed according to the departmental dose-optimization program which includes automated exposure control, adjustment of the mA and/or kV according to patient size and/or use of iterative reconstruction technique. CONTRAST:  70m OMNIPAQUE IOHEXOL 350 MG/ML SOLN COMPARISON:  Chest radiograph of earlier today. CTA chest 08/17/2020 FINDINGS: Cardiovascular: The quality of this exam for evaluation of pulmonary embolism is good. The bolus is well timed. No evidence of pulmonary embolism. Aortic  atherosclerosis. No dissection. Moderate cardiomegaly. Left ventricular hypertrophy. Example 88/5. Three  vessel coronary artery calcification. Mediastinum/Nodes: 1.2 cm node within the azygoesophageal recess measures 1.1 cm on the prior exam. No hilar adenopathy. Prominent prevascular nodes of up to 6 mm are not pathologic by size criteria. Lungs/Pleura: Trace bilateral pleural fluid. Dependent bibasilar atelectasis. Upper Abdomen: Cholecystectomy. Normal imaged portions of the liver, spleen, stomach, pancreas, adrenal glands, left kidney. Musculoskeletal: Thoracolumbar spondylosis. Review of the MIP images confirms the above findings. IMPRESSION: 1.  No evidence of pulmonary embolism. 2. Coronary artery atherosclerosis. Aortic Atherosclerosis (ICD10-I70.0). 3. Cardiomegaly and left ventricular hypertrophy. 4. Prominent anterior and middle mediastinal nodes are favored to be reactive. 5. Trace bilateral pleural fluid. Electronically Signed   By: Abigail Miyamoto M.D.   On: 07/11/2022 06:06   DG Chest Portable 1 View  Result Date: 07/11/2022 CLINICAL DATA:  Shortness of breath, hypoxia. EXAM: PORTABLE CHEST 1 VIEW COMPARISON:  03/03/2022. FINDINGS: The heart is enlarged the mediastinal contour is stable. Atherosclerotic calcification of the aorta is noted. Lung volumes are low with mild atelectasis at the left lung base. No consolidation, effusion, or pneumothorax. Degenerative changes are present in the thoracic spine. IMPRESSION: 1. Low lung volumes with mild atelectasis at the left lung base. 2. Cardiomegaly. Electronically Signed   By: Brett Fairy M.D.   On: 07/11/2022 02:16   CT HEAD CODE STROKE WO CONTRAST  Result Date: 07/11/2022 CLINICAL DATA:  Code stroke.  Acute neurologic deficit EXAM: CT HEAD WITHOUT CONTRAST TECHNIQUE: Contiguous axial images were obtained from the base of the skull through the vertex without intravenous contrast. RADIATION DOSE REDUCTION: This exam was performed according to the  departmental dose-optimization program which includes automated exposure control, adjustment of the mA and/or kV according to patient size and/or use of iterative reconstruction technique. COMPARISON:  None Available. FINDINGS: Brain: There is no mass, hemorrhage or extra-axial collection. There is generalized atrophy without lobar predilection. There is hypoattenuation of the periventricular white matter, most commonly indicating chronic ischemic microangiopathy. Vascular: No abnormal hyperdensity of the major intracranial arteries or dural venous sinuses. No intracranial atherosclerosis. Skull: The visualized skull base, calvarium and extracranial soft tissues are normal. Sinuses/Orbits: No fluid levels or advanced mucosal thickening of the visualized paranasal sinuses. No mastoid or middle ear effusion. The orbits are normal. ASPECTS [facility] Stroke Program Early CT Score) - Ganglionic level infarction (caudate, lentiform nuclei, internal capsule, insula, M1-M3 cortex): 7 - Supraganglionic infarction (M4-M6 cortex): 3 Total score (0-10 with 10 being normal): 10 IMPRESSION: 1. No acute intracranial abnormality. 2. ASPECTS is 10. These results were communicated to Dr. Donnetta Simpers at 1:19 am on 07/11/2022 by text page via the [facility] messaging system. Electronically Signed   By: Ulyses Jarred M.D.   On: 07/11/2022 01:19    Procedures Procedures    Medications Ordered in ED Medications  iohexol (OMNIPAQUE) 350 MG/ML injection 55 mL (55 mLs Intravenous Contrast Given 07/11/22 0549)    ED Course/ Medical Decision Making/ A&P                           Medical Decision Making Amount and/or Complexity of Data Reviewed Labs: ordered. Radiology: ordered.  Risk Prescription drug management.   This patient presents to the ED for concern of altered mental status, this involves an extensive number of treatment options, and is a complaint that carries with it a high risk of complications and morbidity.  I  considered the following differential and admission for this acute, potentially life threatening condition.  The differential diagnosis includes stroke, polypharmacy, hypoxia, metabolic encephalopathy, acute infection  MDM:    This is an 81 year old female with recent total knee replacement who presents as a code stroke.  Per EMS she was hypoxic upon arrival and altered.  Patient does report that she just started taking her pain medication and muscle relaxer today.  These are not normal medications for her.  She is awake, alert, and oriented upon my evaluation.  She has fluent speech.  There is no obvious focal defect.  Her left lower extremity is difficult to assess secondary to her recent surgery; however, she states that it is at her baseline.  She was evaluated by neurology.  Low suspicion for stroke.  I also have low suspicion for stroke and feel that her left lower extremity limited evaluation is likely secondary to her recent surgery.  Do not feel she needs MRI.  CT scan is reassuring.  Lab works reassuring.  She was titrated off oxygen.  O2 sats 93 to 94%.  At 1 point she did desat to 87% and was placed back on oxygen.  Given recent surgery, will obtain CTA to rule out PE.  CTA is negative for acute blood clot.  Patient states that she is using her incentive spirometer at home.  She has a temperature here of 100.3.  However, she denies fevers or infectious symptoms.  No leukocytosis.  There is no obvious pneumonia on her chest x-ray.  Suspect this may be postoperative related.  She was again titrated off oxygen.  She reports that she is at her baseline and would like to go home.  Feel this is reasonable.  Reinforced use of incentive spirometry at home.  (Labs, imaging, consults)  Labs: I Ordered, and personally interpreted labs.  The pertinent results include: CBC, ethanol, UDS, CMP, PT/INR  Imaging Studies ordered: I ordered imaging studies including CT head, CT chest I independently visualized  and interpreted imaging. I agree with the radiologist interpretation  Additional history obtained from caregiver at bedside.  External records from outside source obtained and reviewed including recent admission for total knee replacement  Cardiac Monitoring: The patient was maintained on a cardiac monitor.  I personally viewed and interpreted the cardiac monitored which showed an underlying rhythm of: Normal sinus rhythm  Reevaluation: After the interventions noted above, I reevaluated the patient and found that they have :improved  Social Determinants of Health: Recent hip replacement, receiving in home care  Disposition: Discharge  Co morbidities that complicate the patient evaluation  Past Medical History:  Diagnosis Date   Adrenal insufficiency ([place]) 07/25/2021   Anxiety    Arrhythmia    right bundle branch block   Bipolar 1 disorder ([place])    Cancer ([place])    right breast cancer   Depression    Dyspnea    with exertion   GERD (gastroesophageal reflux disease)    Headache    Heart murmur    Hyperlipidemia    Hypertension    Hypothyroidism    Morbid obesity ([place])    Osteoarthritis    Pneumonia    PONV (postoperative nausea and vomiting)    Schizo-affective schizophrenia ([place])    Thyroid disease    hypothyroidism     Medicines Meds ordered this encounter  Medications   iohexol (OMNIPAQUE) 350 MG/ML injection 55 mL    I have reviewed the patients home medicines and have made adjustments as needed  Problem List / ED Course: Problem List Items Addressed This Visit  None Visit Diagnoses     Slurred speech    -  Primary   SOB (shortness of breath)                       Final Clinical Impression(s) / ED Diagnoses Final diagnoses:  Slurred speech  SOB (shortness of breath)    Rx / DC Orders ED Discharge Orders     None         Giulia Hickey, Barbette Hair, MD 07/11/22 [phone]

## 2022-07-11 NOTE — Consult Note (Signed)
NEUROLOGY CONSULTATION NOTE   Date of service: July 11, 2022 Patient Name: [name] MRN:  [id number] DOB:  10/31/1941 Reason for consult: "Stroke code for slurred speech" Requesting Provider: Merryl Hacker, MD _ _ _   _ __   _ __ _ _  __ __   _ __   __ _  History of Present Illness  [name] is a 81 y.o. female with PMH significant for GERD, headache, hypertension, hyperlipidemia, hypothyroidism, morbid obesity, osteoarthritis who underwent left TKR on 07/08/2022 and was discharged home.  She was last seen by her caretakers at her baseline at 44.  She was noted to be hard to arouse and unresponsive and so they called EMS.  On arrival, EMS noted that sats were 79 and she had slurred speech and seemed confused.  She was brought in as a code stroke.  On my evaluation at the bridge, patient is awake, alert, oriented x3.  She does have slurred speech but also noted to have dry mild and slurred speech resolved after wet cotton swabs in her mouth.  She was put on oxygen and a stat CT head without contrast was obtained which was negative for a large territory stroke or ICH.  LKW: 1945 mRS: 3 tNKASE: not offered, outsid window and low suspicion for stroke. Thrombectomy: Not offered, low suspicion for LVO. NIHSS components Score: Comment  1a Level of Conscious 0'[x]'$  1'[]'$  2'[]'$  3'[]'$      1b LOC Questions 0'[x]'$  1'[]'$  2'[]'$       1c LOC Commands 0'[x]'$  1'[]'$  2'[]'$       2 Best Gaze 0'[x]'$  1'[]'$  2'[]'$       3 Visual 0'[x]'$  1'[]'$  2'[]'$  3'[]'$      4 Facial Palsy 0'[x]'$  1'[]'$  2'[]'$  3'[]'$      5a Motor Arm - left 0'[x]'$  1'[]'$  2'[]'$  3'[]'$  4'[]'$  UN'[]'$    5b Motor Arm - Right 0'[x]'$  1'[]'$  2'[]'$  3'[]'$  4'[]'$  UN'[]'$    6a Motor Leg - Left 0'[]'$  1'[]'$  2'[]'$  3'[x]'$  4'[]'$  UN'[]'$  Left TKR on 8/1 and pain with lifting her leg. Able to wiggle her toes on the left foot.  6b Motor Leg - Right 0'[x]'$  1'[]'$  2'[]'$  3'[]'$  4'[]'$  UN'[]'$    7 Limb Ataxia 0'[x]'$  1'[]'$  2'[]'$  3'[]'$  UN'[]'$     8 Sensory 0'[x]'$  1'[]'$  2'[]'$  UN'[]'$      9 Best Language 0'[x]'$  1'[]'$  2'[]'$  3'[]'$      10 Dysarthria 0'[]'$  1'[x]'$  2'[]'$  UN'[]'$      11 Extinct. and  Inattention 0'[x]'$  1'[]'$  2'[]'$       TOTAL:        ROS   Constitutional Denies weight loss, fever and chills.   HEENT Denies changes in vision and hearing.   Respiratory Denies SOB and cough.   CV Denies palpitations and CP   GI Denies abdominal pain, nausea, vomiting and diarrhea.   GU Denies dysuria and urinary frequency.   MSK Denies myalgia and joint pain.   Skin Denies rash and pruritus.   Neurological Denies headache and syncope.   Psychiatric Denies recent changes in mood. Denies anxiety and depression.    Past History   Past Medical History:  Diagnosis Date   Adrenal insufficiency ([place]) 07/25/2021   Anxiety    Arrhythmia    right bundle branch block   Bipolar 1 disorder (HCC)    Cancer (HCC)    right breast cancer   Depression    Dyspnea    with exertion   GERD (gastroesophageal reflux disease)    Headache    Heart  murmur    Hyperlipidemia    Hypertension    Hypothyroidism    Morbid obesity ([place])    Osteoarthritis    Pneumonia    PONV (postoperative nausea and vomiting)    Schizo-affective schizophrenia ([place])    Thyroid disease    hypothyroidism   Past Surgical History:  Procedure Laterality Date   BREAST SURGERY     CHOLECYSTECTOMY     DILATION AND CURETTAGE OF UTERUS     ESOPHAGEAL MANOMETRY N/A 01/29/2018   Procedure: ESOPHAGEAL MANOMETRY (EM);  Surgeon: Wonda Horner, MD;  Location: [facility];  Service: Endoscopy;  Laterality: N/A;   HAND SURGERY     Right-trigger finger   IR THORACENTESIS ASP PLEURAL SPACE W/IMG GUIDE  06/28/2018   IR THORACENTESIS ASP PLEURAL SPACE W/IMG GUIDE  07/19/2018   KNEE SURGERY     Left   TONSILLECTOMY     TOTAL KNEE ARTHROPLASTY Left 07/08/2022   Procedure: TOTAL KNEE ARTHROPLASTY;  Surgeon: Paralee Cancel, MD;  Location: [facility];  Service: Orthopedics;  Laterality: Left;   Family History  Problem Relation Age of Onset   Hypertension Mother    Lung cancer Brother    Schizophrenia Brother    Social History    Socioeconomic History   Marital status: Divorced    Spouse name: Not on file   Number of children: Not on file   Years of education: Not on file   Highest education level: Not on file  Occupational History   Not on file  Tobacco Use   Smoking status: Former    Types: Cigarettes    Quit date: 12/08/1978    Years since quitting: 43.6   Smokeless tobacco: Never  Vaping Use   Vaping Use: Never used  Substance and Sexual Activity   Alcohol use: Never   Drug use: No   Sexual activity: Not on file  Other Topics Concern   Not on file  Social History Narrative   Lives in [place] (has been there 10 years) - states in independent living / has a Marine scientist helps her with meds      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      No caffeine      Social Determinants of Health   Financial Resource Strain: Not on file  Food Insecurity: Not on file  Transportation Needs: Not on file  Physical Activity: Not on file  Stress: Not on file  Social Connections: Not on file   Allergies  Allergen Reactions   Lithium Nausea Only   Fluoxetine Other (See Comments)    Pt felt crazy   Macrodantin Other (See Comments)    Unknown reaction   Mirabegron Other (See Comments)    ineffective   Nitrofurantoin Other (See Comments)    Other reaction(s): Did not agree with me   Paxil [Paroxetine] Other (See Comments)    Made pt feel crazy    Prednisone Other (See Comments)    Makes her feel very jittery   Prozac [Fluoxetine Hcl] Other (See Comments)    Pt felt crazy    Sertraline Hcl Other (See Comments)    Unknown reaction   Solifenacin Other (See Comments)    Ineffective    Sulfa Antibiotics Other (See Comments)    Unknown reaction   Wellbutrin [Bupropion Hcl] Other (See Comments)    Unknown reaction    Medications  (Not in a hospital admission)    Vitals   Vitals:   07/11/22 0100 07/11/22 0124  BP:  Marland Kitchen)  126/45  Pulse:  80  Resp:  (!) 21  Temp:  98.9 F (37.2 C)  TempSrc:  Oral   SpO2:  97%  Weight: 84.4 kg   Height: '5\' 2"'$  (1.575 m)      Body mass index is 34.03 kg/m.  Physical Exam   General: Laying comfortably in bed; in no acute distress.  HENT: Normal oropharynx and mucosa. Normal external appearance of ears and nose.  Neck: Supple, no pain or tenderness CV: No JVD. No peripheral edema.  Pulmonary: Symmetric Chest rise. Normal respiratory effort.  Abdomen: Soft to touch, non-tender.  Ext: No cyanosis, edema, or deformity  Skin: No rash. Normal palpation of skin.   Musculoskeletal: Normal digits and nails by inspection. No clubbing.  Neurologic Examination  Mental status/Cognition: Alert, oriented to self, place, month and year, good attention.  Speech/language: dysarthric speech that resolved with pplying wet cotton swabs to her mouth, Fluent, comprehension intact, object naming intact, repetition intact.  Cranial nerves:   CN II Pupils equal and reactive to light, no VF deficits    CN III,IV,VI EOM intact, no gaze preference or deviation, no nystagmus    CN V normal sensation in V1, V2, and V3 segments bilaterally    CN VII no asymmetry, no nasolabial fold flattening    CN VIII normal hearing to speech    CN IX & X normal palatal elevation, no uvular deviation    CN XI 5/5 head turn and 5/5 shoulder shrug bilaterally    CN XII midline tongue protrusion    Motor:  Muscle bulk: normal, tone normal, pronator drift none. Is shivering but that resolved with some warm towels. Mvmt Root Nerve  Muscle Right Left Comments  SA C5/6 Ax Deltoid 5 5   EF C5/6 Mc Biceps 5 5   EE C6/7/8 Rad Triceps 5 5   WF C6/7 Med FCR     WE C7/8 PIN ECU     F Ab C8/T1 U ADM/FDI 5 5   HF L1/2/3 Fem Illopsoas 5 - Left TKA 2 days ago and thus deferred detailed L leg evaluation. She can wiggle her toes on left.  KE L2/3/4 Fem Quad 5 -   DF L4/5 D Peron Tib Ant 5 2   PF S1/2 Tibial Grc/Sol 5 2    Reflexes:  Right Left Comments  Pectoralis      Biceps (C5/6) 2 2    Brachioradialis (C5/6) 2 2    Triceps (C6/7) 2 2    Patellar (L3/4) 2     Achilles (S1)      Hoffman      Plantar     Jaw jerk    Sensation:  Light touch Intact throughout   Pin prick    Temperature    Vibration   Proprioception    Coordination/Complex Motor:  - Finger to Nose intact BL - Heel to shin unable to do - Rapid alternating movement are slowed. - Gait: Deferred for patient safety.  Labs   CBC:  Recent Labs  Lab 07/09/22 0250 07/11/22 0104 07/11/22 0107  WBC 9.8 10.3  --   NEUTROABS  --  5.3  --   HGB 11.1* 10.5* 10.5*  HCT 33.6* 32.3* 31.0*  MCV 95.7 98.5  --   PLT 178 145*  --     Basic Metabolic Panel:  Lab Results  Component Value Date   NA 137 07/11/2022   K 4.4 07/11/2022   CO2 25 07/09/2022   GLUCOSE 94  07/11/2022   BUN 14 07/11/2022   CREATININE 0.70 07/11/2022   CALCIUM 8.8 (L) 07/09/2022   GFRNONAA >60 07/09/2022   GFRAA >60 08/20/2020   Lipid Panel:  Lab Results  Component Value Date   LDLCALC 88 10/10/2015   HgbA1c: No results found for: "HGBA1C" Urine Drug Screen:     Component Value Date/Time   LABOPIA NONE DETECTED 08/17/2020 1226   COCAINSCRNUR NONE DETECTED 08/17/2020 1226   LABBENZ POSITIVE (A) 08/17/2020 1226   AMPHETMU NONE DETECTED 08/17/2020 1226   THCU NONE DETECTED 08/17/2020 1226   LABBARB NONE DETECTED 08/17/2020 1226    Alcohol Level No results found for: "ETH"  CT Head without contrast(Personally reviewed): CTH was negative for a large hypodensity concerning for a large territory infarct or hyperdensity concerning for an ICH  MRI Brain(Personally reviewed): pending  Impression   [name] is a 81 y.o. female with PMH significant for GERD, headache, hypertension, hyperlipidemia, hypothyroidism, morbid obesity, osteoarthritis who underwent left TKR on 07/08/2022 and was discharged home.  She was last seen by her caretakers at her baseline at 35.  She was noted to be hard to arouse and unresponsive and  so they called EMS.  On arrival, EMS noted that sats were 79 and she had slurred speech and seemed confused.  She was brought in as a code stroke. Her neurologic examination is notable for L leg weakness but suspect that this is likely due to pain in her left knee after TKR 2 days ago.  Recommendations  - Overall, low suspicion for stroke but if felt that Left leg weakness is out of proportion to pain from TKR, recommend getting MRI Brain without contrast. Suspect that hypoxia and dry mouth probably contributed to her confusion and slurred speech. ______________________________________________________________________   Thank you for the opportunity to take part in the care of this patient. If you have any further questions, please contact the neurology consultation attending.  Signed,  Jordan Pager Number [phone] _ _ _   _ __   _ __ _ _  __ __   _ __   __ _

## 2022-07-11 NOTE — ED Notes (Signed)
Pt found to be soaked in urine. Bed changed and report given to ptar

## 2022-07-11 NOTE — Code Documentation (Signed)
Stroke Response Nurse Documentation Code Documentation  [name] is a 81 y.o. female arriving to [facility]  via [facility] on 8/4 with past medical hx of Bipolar, HTN, Schizophrenia, HLD, knee surgery. On aspirin 81 mg daily. Code stroke was activated by EMS.   Patient from SNF where she was LKW at [facility] and now complaining of slurred speech.  Stroke team at the bedside on patient arrival. Labs drawn and patient cleared for CT by Dr. Dina Rich. Patient to CT with team. NIHSS 3, see documentation for details and code stroke times. Patient with left leg weakness on exam. The following imaging was completed:  CT Head. Patient is not a candidate for IV Thrombolytic due to Out of window. Patient is not a candidate for IR due to low suspicion for LVO.    Bedside handoff with ED RN [place].    Madelynn Done  Rapid Response RN

## 2022-07-11 NOTE — ED Notes (Signed)
Ptar called 

## 2022-07-14 DIAGNOSIS — M6281 Muscle weakness (generalized): Secondary | ICD-10-CM | POA: Diagnosis not present

## 2022-07-14 DIAGNOSIS — M25562 Pain in left knee: Secondary | ICD-10-CM | POA: Diagnosis not present

## 2022-07-14 DIAGNOSIS — R262 Difficulty in walking, not elsewhere classified: Secondary | ICD-10-CM | POA: Diagnosis not present

## 2022-07-14 DIAGNOSIS — Z96652 Presence of left artificial knee joint: Secondary | ICD-10-CM | POA: Diagnosis not present

## 2022-07-15 DIAGNOSIS — R262 Difficulty in walking, not elsewhere classified: Secondary | ICD-10-CM | POA: Diagnosis not present

## 2022-07-15 DIAGNOSIS — M25562 Pain in left knee: Secondary | ICD-10-CM | POA: Diagnosis not present

## 2022-07-15 DIAGNOSIS — Z96652 Presence of left artificial knee joint: Secondary | ICD-10-CM | POA: Diagnosis not present

## 2022-07-15 DIAGNOSIS — M6281 Muscle weakness (generalized): Secondary | ICD-10-CM | POA: Diagnosis not present

## 2022-07-16 DIAGNOSIS — M6281 Muscle weakness (generalized): Secondary | ICD-10-CM | POA: Diagnosis not present

## 2022-07-16 DIAGNOSIS — Z96652 Presence of left artificial knee joint: Secondary | ICD-10-CM | POA: Diagnosis not present

## 2022-07-16 DIAGNOSIS — R262 Difficulty in walking, not elsewhere classified: Secondary | ICD-10-CM | POA: Diagnosis not present

## 2022-07-16 DIAGNOSIS — M25562 Pain in left knee: Secondary | ICD-10-CM | POA: Diagnosis not present

## 2022-07-17 ENCOUNTER — Encounter: Payer: Medicare HMO | Admitting: Physical Medicine & Rehabilitation

## 2022-07-18 DIAGNOSIS — R262 Difficulty in walking, not elsewhere classified: Secondary | ICD-10-CM | POA: Diagnosis not present

## 2022-07-18 DIAGNOSIS — M6281 Muscle weakness (generalized): Secondary | ICD-10-CM | POA: Diagnosis not present

## 2022-07-18 DIAGNOSIS — Z96652 Presence of left artificial knee joint: Secondary | ICD-10-CM | POA: Diagnosis not present

## 2022-07-18 DIAGNOSIS — M25562 Pain in left knee: Secondary | ICD-10-CM | POA: Diagnosis not present

## 2022-07-21 DIAGNOSIS — Z96652 Presence of left artificial knee joint: Secondary | ICD-10-CM | POA: Diagnosis not present

## 2022-07-21 DIAGNOSIS — R262 Difficulty in walking, not elsewhere classified: Secondary | ICD-10-CM | POA: Diagnosis not present

## 2022-07-21 DIAGNOSIS — M6281 Muscle weakness (generalized): Secondary | ICD-10-CM | POA: Diagnosis not present

## 2022-07-21 DIAGNOSIS — M25562 Pain in left knee: Secondary | ICD-10-CM | POA: Diagnosis not present

## 2022-07-22 DIAGNOSIS — M25562 Pain in left knee: Secondary | ICD-10-CM | POA: Diagnosis not present

## 2022-07-22 DIAGNOSIS — R262 Difficulty in walking, not elsewhere classified: Secondary | ICD-10-CM | POA: Diagnosis not present

## 2022-07-22 DIAGNOSIS — Z96652 Presence of left artificial knee joint: Secondary | ICD-10-CM | POA: Diagnosis not present

## 2022-07-22 DIAGNOSIS — M6281 Muscle weakness (generalized): Secondary | ICD-10-CM | POA: Diagnosis not present

## 2022-07-24 DIAGNOSIS — M25562 Pain in left knee: Secondary | ICD-10-CM | POA: Diagnosis not present

## 2022-07-24 DIAGNOSIS — R262 Difficulty in walking, not elsewhere classified: Secondary | ICD-10-CM | POA: Diagnosis not present

## 2022-07-24 DIAGNOSIS — Z96652 Presence of left artificial knee joint: Secondary | ICD-10-CM | POA: Diagnosis not present

## 2022-07-24 DIAGNOSIS — M6281 Muscle weakness (generalized): Secondary | ICD-10-CM | POA: Diagnosis not present

## 2022-07-25 DIAGNOSIS — M6281 Muscle weakness (generalized): Secondary | ICD-10-CM | POA: Diagnosis not present

## 2022-07-25 DIAGNOSIS — M25562 Pain in left knee: Secondary | ICD-10-CM | POA: Diagnosis not present

## 2022-07-25 DIAGNOSIS — Z96652 Presence of left artificial knee joint: Secondary | ICD-10-CM | POA: Diagnosis not present

## 2022-07-25 DIAGNOSIS — R262 Difficulty in walking, not elsewhere classified: Secondary | ICD-10-CM | POA: Diagnosis not present

## 2022-07-28 DIAGNOSIS — M6281 Muscle weakness (generalized): Secondary | ICD-10-CM | POA: Diagnosis not present

## 2022-07-28 DIAGNOSIS — Z96652 Presence of left artificial knee joint: Secondary | ICD-10-CM | POA: Diagnosis not present

## 2022-07-28 DIAGNOSIS — R262 Difficulty in walking, not elsewhere classified: Secondary | ICD-10-CM | POA: Diagnosis not present

## 2022-07-28 DIAGNOSIS — M25562 Pain in left knee: Secondary | ICD-10-CM | POA: Diagnosis not present

## 2022-07-29 DIAGNOSIS — M25562 Pain in left knee: Secondary | ICD-10-CM | POA: Diagnosis not present

## 2022-07-29 DIAGNOSIS — M6281 Muscle weakness (generalized): Secondary | ICD-10-CM | POA: Diagnosis not present

## 2022-07-29 DIAGNOSIS — R262 Difficulty in walking, not elsewhere classified: Secondary | ICD-10-CM | POA: Diagnosis not present

## 2022-07-29 DIAGNOSIS — Z96652 Presence of left artificial knee joint: Secondary | ICD-10-CM | POA: Diagnosis not present

## 2022-07-30 DIAGNOSIS — R262 Difficulty in walking, not elsewhere classified: Secondary | ICD-10-CM | POA: Diagnosis not present

## 2022-07-30 DIAGNOSIS — M6281 Muscle weakness (generalized): Secondary | ICD-10-CM | POA: Diagnosis not present

## 2022-07-30 DIAGNOSIS — Z96652 Presence of left artificial knee joint: Secondary | ICD-10-CM | POA: Diagnosis not present

## 2022-07-30 DIAGNOSIS — M25562 Pain in left knee: Secondary | ICD-10-CM | POA: Diagnosis not present

## 2022-07-31 DIAGNOSIS — M6281 Muscle weakness (generalized): Secondary | ICD-10-CM | POA: Diagnosis not present

## 2022-07-31 DIAGNOSIS — R262 Difficulty in walking, not elsewhere classified: Secondary | ICD-10-CM | POA: Diagnosis not present

## 2022-07-31 DIAGNOSIS — M25562 Pain in left knee: Secondary | ICD-10-CM | POA: Diagnosis not present

## 2022-07-31 DIAGNOSIS — Z96652 Presence of left artificial knee joint: Secondary | ICD-10-CM | POA: Diagnosis not present

## 2022-08-03 NOTE — Progress Notes (Unsigned)
Name: [name]  MRN/ DOB: [id number], 10/14/1941    Age/ Sex: 81 y.o., female     PCP: Charlane Ferretti, MD   Reason for Endocrinology Evaluation: Adrenal insufficiency      Initial Endocrinology Clinic Visit: 07/10/2021    PATIENT IDENTIFIER: [name] is a 81 y.o., female with a past medical history of CAD, CHF, IBS, hypothyroidism and Hx of breast ca ( S/P lumpectomy and radiation 2010) . She has followed with [facility] since 07/10/2021 for consultative assistance with management of her adrenal insufficiency.   HISTORICAL SUMMARY:  She was diagnosed with Secondary adrenal insufficiency based on abnormal Cosyntropin Stim Test 60 minute cortisol of 11.8 ug/dL in 03/2021. ACTH at the lower end of normal 7 pg/mL.   Of note, the pt  has been on long term left knee intra-articular injections, declines surgery She is on Hydrocodone for pain   We started [facility] due to chronic use of intra-articular injection    THYROID HISTORY: She has also been noted with low TSH with a nadir of 0.01 uIU/mL in 11/2020. Pt on levothyroxine       HYPERCALCEMIA HISTORY: She has been noted to have hypercalcemia on 04/19/2021 with a corrected serum calcium of 10.4 (reference 8.6-10.3). Repeat labs at our clinic 07/1999 showed normal calcium at 10.3 mg, normal ionized calcium 5.45 mg/dl , normal Vit D 47.63 ng/mL as well as normal 1,25 dihydroxy vitamin D 53 pg/mL but low PTH at 12 pg/mL and low PTHrP 10 pg/mL      SUBJECTIVE:    Today (08/04/2022):  [name] is here for adrenal insufficiency, hypothyroidism.    Pt presented to the ED with aphasia 07/11/2022, CT angio did not reveal PE   She is accompanied by her friend Ivin Booty   She is going through PT for left knee  She accidentally was off HC sometime in March and part of Apri   Weight has decreased  Denies dizziness  Denies vomiting nor diarrhea  No local neck symptoms   No facial  puffiness   Hydrocortisone 5 mg, 2  tablets with breakfast and 1 tablet in the afternoon levothyroxine 125  MCG , 1 tablet daily    HISTORY:  Past Medical History:  Past Medical History:  Diagnosis Date   Adrenal insufficiency ([place]) 07/25/2021   Anxiety    Arrhythmia    right bundle branch block   Bipolar 1 disorder ([place])    Cancer ([place])    right breast cancer   Depression    Dyspnea    with exertion   GERD (gastroesophageal reflux disease)    Headache    Heart murmur    Hyperlipidemia    Hypertension    Hypothyroidism    Morbid obesity ([place])    Osteoarthritis    Pneumonia    PONV (postoperative nausea and vomiting)    Schizo-affective schizophrenia ([place])    Thyroid disease    hypothyroidism   Past Surgical History:  Past Surgical History:  Procedure Laterality Date   BREAST SURGERY     CHOLECYSTECTOMY     DILATION AND CURETTAGE OF UTERUS     ESOPHAGEAL MANOMETRY N/A 01/29/2018   Procedure: ESOPHAGEAL MANOMETRY (EM);  Surgeon: Wonda Horner, MD;  [facility];  Service: Endoscopy;  Laterality: N/A;   HAND SURGERY     Right-trigger finger   IR THORACENTESIS ASP PLEURAL SPACE W/IMG GUIDE  06/28/2018   IR THORACENTESIS ASP PLEURAL SPACE W/IMG GUIDE  07/19/2018   KNEE SURGERY     Left   TONSILLECTOMY     TOTAL KNEE ARTHROPLASTY Left 07/08/2022   Procedure: TOTAL KNEE ARTHROPLASTY;  Surgeon: Paralee Cancel, MD;  Location: [facility];  Service: Orthopedics;  Laterality: Left;   Social History:  reports that she quit smoking about 43 years ago. Her smoking use included cigarettes. She has never used smokeless tobacco. She reports that she does not drink alcohol and does not use drugs. Family History:  Family History  Problem Relation Age of Onset   Hypertension Mother    Lung cancer Brother    Schizophrenia Brother      HOME MEDICATIONS: Allergies as of 08/04/2022       Reactions   Lithium Nausea Only   Fluoxetine Other (See Comments)   Pt felt crazy   Macrodantin Other (See Comments)    Unknown reaction   Mirabegron Other (See Comments)   ineffective   Nitrofurantoin Other (See Comments)   Other reaction(s): Did not agree with me   Paxil [paroxetine] Other (See Comments)   Made pt feel crazy   Prednisone Other (See Comments)   Makes her feel very jittery   Prozac [fluoxetine Hcl] Other (See Comments)   Pt felt crazy   Sertraline Hcl Other (See Comments)   Unknown reaction   Solifenacin Other (See Comments)   Ineffective    Sulfa Antibiotics Other (See Comments)   Unknown reaction   Wellbutrin [bupropion Hcl] Other (See Comments)   Unknown reaction        Medication List        Accurate as of August 04, 2022  9:37 AM. If you have any questions, ask your nurse or doctor.          acetaminophen 500 MG tablet Commonly known as: TYLENOL Take 2 tablets (1,000 mg total) by mouth every 8 (eight) hours.   albuterol 108 (90 Base) MCG/ACT inhaler Commonly known as: VENTOLIN HFA Inhale 2 puffs into the lungs every 6 (six) hours as needed for wheezing or shortness of breath.   amLODipine 2.5 MG tablet Commonly known as: NORVASC Take 2.5 mg by mouth at bedtime.   aspirin 81 MG chewable tablet Chew 1 tablet (81 mg total) by mouth 2 (two) times daily for 28 days.   atorvastatin 40 MG tablet Commonly known as: LIPITOR Take 40 mg by mouth at bedtime.   Biotin 10 MG Tabs Take 10,000 mcg by mouth every morning.   CALCIUM CARBONATE PO Take 600 mg by mouth every morning.   co-enzyme Q-10 30 MG capsule Take 30 mg by mouth daily.   docusate sodium 100 MG capsule Commonly known as: COLACE Take 1 capsule (100 mg total) by mouth 2 (two) times daily.   Fish Oil 1200 MG Caps Take 1,200 mg by mouth every morning.   gabapentin 100 MG capsule Commonly known as: NEURONTIN TAKE 2 CAPSULES BY MOUTH AT BEDTIME What changed:  how to take this when to take this   hydrocortisone 5 MG tablet Commonly known as: CORTEF Take 1 tablet (5 mg total) by mouth as  directed. 2 tabs with breakfast, and 1 tablet in the afternoon What changed:  how much to take when to take this additional instructions   lamoTRIgine 200 MG tablet Commonly known as: LAMICTAL Take 200 mg by mouth at bedtime.   levothyroxine 125 MCG tablet Commonly known as: SYNTHROID Take 1 tablet (125 mcg total) by mouth as directed. 1.5 tablet on Sundays, 1 tablet  Monday through Saturday What changed:  when to take this additional instructions   memantine 10 MG tablet Commonly known as: NAMENDA Take 10 mg by mouth 2 (two) times daily.   methocarbamol 500 MG tablet Commonly known as: ROBAXIN Take 1 tablet (500 mg total) by mouth every 6 (six) hours as needed for muscle spasms (muscle pain).   metoprolol tartrate 25 MG tablet Commonly known as: LOPRESSOR Take 25 mg by mouth 2 (two) times daily.   multivitamin with minerals Tabs tablet Take 1 tablet by mouth every morning.   OLANZapine 15 MG tablet Commonly known as: ZYPREXA Take 30 mg by mouth at bedtime.   omeprazole 40 MG capsule Commonly known as: PRILOSEC Take 40 mg by mouth in the morning.   oxyCODONE 5 MG immediate release tablet Commonly known as: Oxy IR/ROXICODONE Take 1 tablet (5 mg total) by mouth every 4 (four) hours as needed for severe pain.   polyethylene glycol 17 g packet Commonly known as: MIRALAX / GLYCOLAX Take 17 g by mouth daily as needed for mild constipation.   polyvinyl alcohol 1.4 % ophthalmic solution Commonly known as: LIQUIFILM TEARS Place 1 drop into both eyes as needed for dry eyes.   ramelteon 8 MG tablet Commonly known as: ROZEREM Take 8 mg by mouth at bedtime.   rosuvastatin 20 MG tablet Commonly known as: CRESTOR Take 1 tablet (20 mg total) by mouth daily. Need labs from PCP   spironolactone 25 MG tablet Commonly known as: ALDACTONE TAKE 1 TABLET (25 MG TOTAL) BY MOUTH DAILY.   traMADol 50 MG tablet Commonly known as: ULTRAM Take 50-100 mg by mouth every 6 (six)  hours as needed.   traZODone 50 MG tablet Commonly known as: DESYREL Take 150 mg by mouth at bedtime.   VITAMIN B-12 PO Take 2,500 mcg by mouth every morning.   VITAMIN C PO Take 500 mg by mouth every morning.   Vitamin D 50 MCG (2000 UT) tablet Take 2,000 Units by mouth daily.          OBJECTIVE:   PHYSICAL EXAM: VS: BP 114/70 (BP Location: Left Arm, Patient Position: Sitting, Cuff Size: Large)   Pulse 66   Ht '5\' 2"'$  (1.575 m)   Wt 174 lb 6.4 oz (79.1 kg)   SpO2 94%   BMI 31.90 kg/m    EXAM: General: Pt appears well and is in NAD  Lungs: Clear with good BS bilat with no rales, rhonchi, or wheezes  Heart: Auscultation: RRR.  Abdomen: Normoactive bowel sounds, soft, nontender, without masses or organomegaly palpable  Extremities:  BL LE: No pretibial edema normal ROM and strength.  Mental Status: Judgment, insight: Intact Orientation: Oriented to time, place, and person Mood and affect: No depression, anxiety, or agitation     DATA REVIEWED: 06/04/2022 TSH 3.830  GFR 83 BUN 14 Cr. 0.700     ASSESSMENT / PLAN / RECOMMENDATIONS:    Secondary Adrenal Insufficiency:   -Patient had an abnormal cosyntropin stimulation test in 04/2021 -I suspect this is secondary adrenal insufficiency due to chronic intra-articular injections as well as chronic opiate use -Patient did not take hydrocortisone sometime between March and April of this year, and she did not feel any different.  I suspect that the patient has some form of endogenous cortisol production.  We discussed that by her next visit we will do further testing to see if we can wean her off the hydrocortisone -She is currently recovering from a left knee replacement and  undergoing physical therapy - we discussed sick day rules        Medication Hydrocortisone 5 mg, 2 tablets with breakfast and 1 tablet in the afternoon   2. Hypothyroidism   -I had increased her levothyroxine in the past, but the patient  continues to take levothyroxine daily  -Most recent TSH at PCPs office was normal    Continue levothyroxine 125 MCG , daily       F/U in 6 months     Signed electronically by: Mack Guise, MD  [facility]  [address] [place], [place] [address] Phone: [phone] FAX: [phone]      CC: Charlane Ferretti, MD [address] [place] [address] Phone: [phone]  Fax: [phone]   Return to Endocrinology clinic as below: Future Appointments  Date Time Provider [place]  08/04/2022  9:50 AM Charon Smedberg, Melanie Crazier, MD [facility] None  09/11/2022 10:30 AM Pieter Partridge, DO [facility] None  12/31/2022  9:30 AM Benay Pike, MD [facility] None

## 2022-08-04 ENCOUNTER — Encounter: Payer: Self-pay | Admitting: Internal Medicine

## 2022-08-04 ENCOUNTER — Ambulatory Visit (INDEPENDENT_AMBULATORY_CARE_PROVIDER_SITE_OTHER): Payer: Medicare HMO | Admitting: Internal Medicine

## 2022-08-04 VITALS — BP 114/70 | HR 66 | Ht 62.0 in | Wt 174.4 lb

## 2022-08-04 DIAGNOSIS — E039 Hypothyroidism, unspecified: Secondary | ICD-10-CM

## 2022-08-04 DIAGNOSIS — E2749 Other adrenocortical insufficiency: Secondary | ICD-10-CM

## 2022-08-04 MED ORDER — HYDROCORTISONE 5 MG PO TABS
5.0000 mg | ORAL_TABLET | ORAL | 3 refills | Status: DC
Start: 2022-08-04 — End: 2023-02-09

## 2022-08-04 NOTE — Patient Instructions (Signed)
-  Continue  Hydrocortisone 5 mg, TWO tablets every morning and ONE tablet every afternoon between  2-4 pm  - Continue Levothyroxine 125 mcg daily   ADRENAL INSUFFICIENCY SICK DAY RULES:  Should you face an extreme emotional or physical stress such as trauma, surgery or acute illness, this will require extra steroid coverage so that the body can meet that stress.   Without increasing the steroid dose you may experience severe weakness, headache, dizziness, nausea and vomiting and possibly a more serious deterioration in health.  Typically the dose of steroids will only need to be increased for a couple of days if you have an illness that is transient and managed in the community.   If you are unable to take/absorb an increased dose of steroids orally because of vomiting or diarrhea, you will urgently require steroid injections and should present to an Emergency Department.  The general advice for any serious illness is as follows: Double the normal daily steroid dose for up to 3 days if you have a temperature of more than 37.50C (99.49F) with signs of sickness, or severe emotional or physical distress Contact your primary care doctor and Endocrinologist if the illness worsens or it lasts for more than 3 days.  In cases of severe illness, urgent medical assistance should be promptly sought. If you experience vomiting/diarrhea or are unable to take steroids by mouth, please administer the Hydrocortisone injection kit and seek urgent medical help.

## 2022-08-05 DIAGNOSIS — Z96652 Presence of left artificial knee joint: Secondary | ICD-10-CM | POA: Diagnosis not present

## 2022-08-05 DIAGNOSIS — M25562 Pain in left knee: Secondary | ICD-10-CM | POA: Diagnosis not present

## 2022-08-05 DIAGNOSIS — R262 Difficulty in walking, not elsewhere classified: Secondary | ICD-10-CM | POA: Diagnosis not present

## 2022-08-05 DIAGNOSIS — M6281 Muscle weakness (generalized): Secondary | ICD-10-CM | POA: Diagnosis not present

## 2022-08-06 DIAGNOSIS — Z96652 Presence of left artificial knee joint: Secondary | ICD-10-CM | POA: Diagnosis not present

## 2022-08-06 DIAGNOSIS — M25562 Pain in left knee: Secondary | ICD-10-CM | POA: Diagnosis not present

## 2022-08-06 DIAGNOSIS — M6281 Muscle weakness (generalized): Secondary | ICD-10-CM | POA: Diagnosis not present

## 2022-08-06 DIAGNOSIS — R262 Difficulty in walking, not elsewhere classified: Secondary | ICD-10-CM | POA: Diagnosis not present

## 2022-08-07 DIAGNOSIS — M6281 Muscle weakness (generalized): Secondary | ICD-10-CM | POA: Diagnosis not present

## 2022-08-07 DIAGNOSIS — Z96652 Presence of left artificial knee joint: Secondary | ICD-10-CM | POA: Diagnosis not present

## 2022-08-07 DIAGNOSIS — R262 Difficulty in walking, not elsewhere classified: Secondary | ICD-10-CM | POA: Diagnosis not present

## 2022-08-07 DIAGNOSIS — M25562 Pain in left knee: Secondary | ICD-10-CM | POA: Diagnosis not present

## 2022-08-11 DIAGNOSIS — Z96652 Presence of left artificial knee joint: Secondary | ICD-10-CM | POA: Diagnosis not present

## 2022-08-11 DIAGNOSIS — R262 Difficulty in walking, not elsewhere classified: Secondary | ICD-10-CM | POA: Diagnosis not present

## 2022-08-11 DIAGNOSIS — M25562 Pain in left knee: Secondary | ICD-10-CM | POA: Diagnosis not present

## 2022-08-11 DIAGNOSIS — M6281 Muscle weakness (generalized): Secondary | ICD-10-CM | POA: Diagnosis not present

## 2022-08-13 DIAGNOSIS — R262 Difficulty in walking, not elsewhere classified: Secondary | ICD-10-CM | POA: Diagnosis not present

## 2022-08-13 DIAGNOSIS — M25562 Pain in left knee: Secondary | ICD-10-CM | POA: Diagnosis not present

## 2022-08-13 DIAGNOSIS — Z96652 Presence of left artificial knee joint: Secondary | ICD-10-CM | POA: Diagnosis not present

## 2022-08-13 DIAGNOSIS — M6281 Muscle weakness (generalized): Secondary | ICD-10-CM | POA: Diagnosis not present

## 2022-08-15 DIAGNOSIS — R262 Difficulty in walking, not elsewhere classified: Secondary | ICD-10-CM | POA: Diagnosis not present

## 2022-08-15 DIAGNOSIS — Z96652 Presence of left artificial knee joint: Secondary | ICD-10-CM | POA: Diagnosis not present

## 2022-08-15 DIAGNOSIS — M25562 Pain in left knee: Secondary | ICD-10-CM | POA: Diagnosis not present

## 2022-08-15 DIAGNOSIS — M6281 Muscle weakness (generalized): Secondary | ICD-10-CM | POA: Diagnosis not present

## 2022-08-15 IMAGING — CT CT HEAD W/O CM
4 series · 15 of 47 positions shown, 17 images · non-contrast
Comparison: Head CT 08/28/2013

CLINICAL DATA: Head trauma.  Unwitnessed fall.

EXAM:
CT HEAD WITHOUT CONTRAST
TECHNIQUE: Contiguous axial images were obtained from the base of the skull
through the vertex without intravenous contrast.

[Series 503: head without · axial · non-contrast · 0.41mm/px · z∈[-113,+12]mm · 7 of 35 slices shown, 9 images]
[im 5/35  brain]
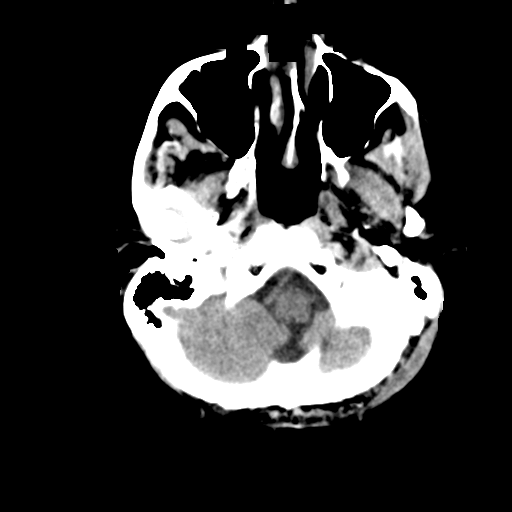
[im 5/35  bone]
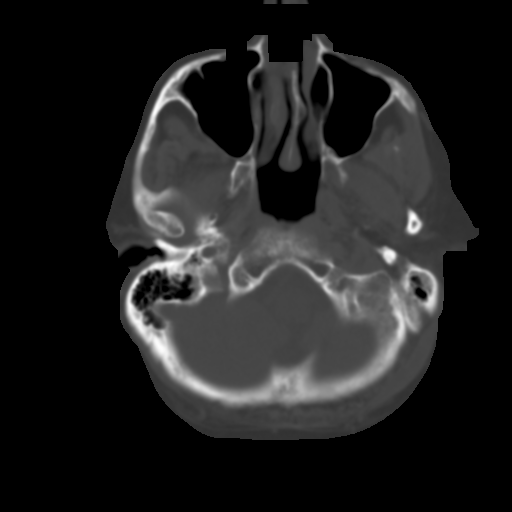
[im 9/35  brain]
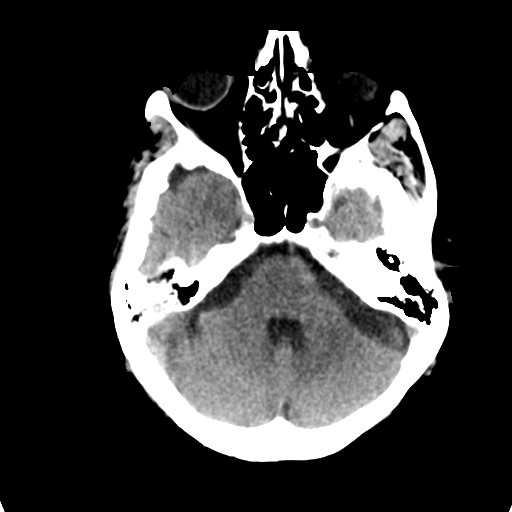
[im 13/35  brain]
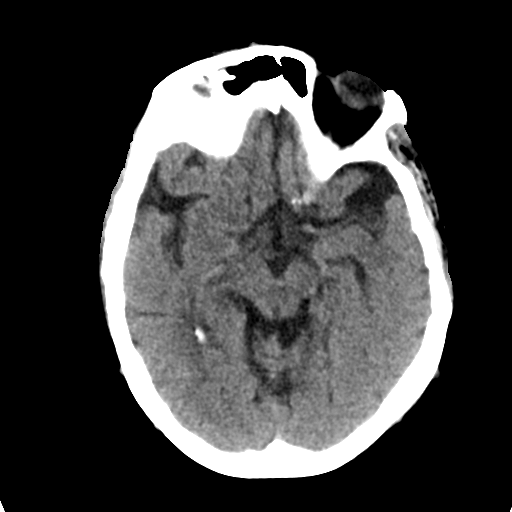
[im 18/35  brain]
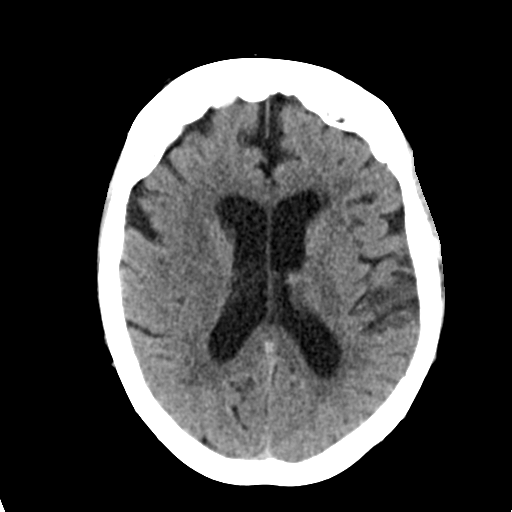
[im 22/35  brain]
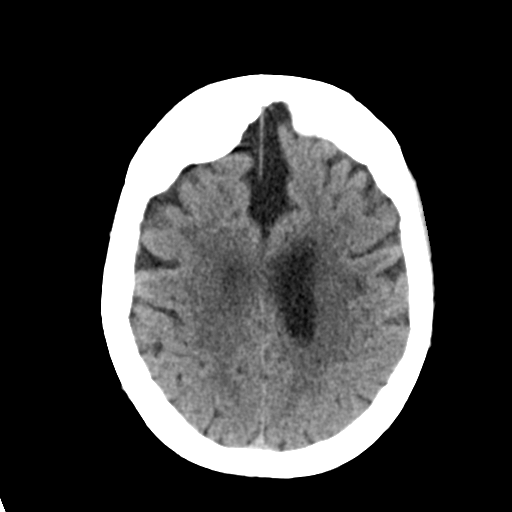
[im 22/35  bone]
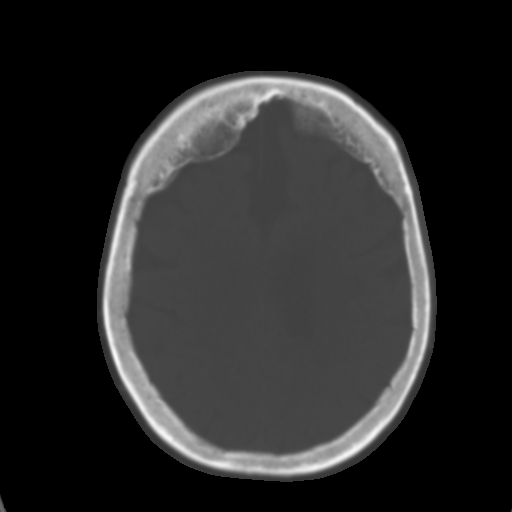
[im 26/35  brain]
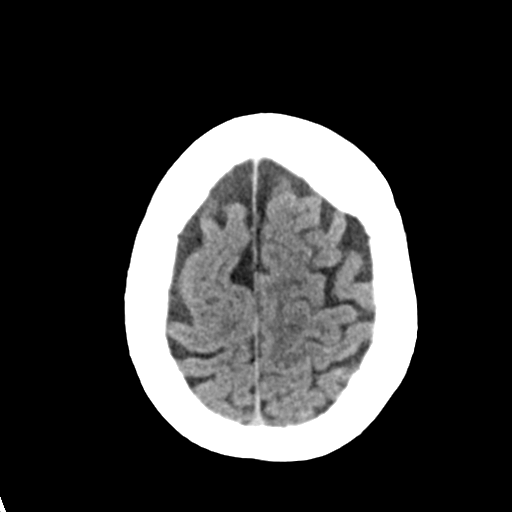
[im 30/35  brain]
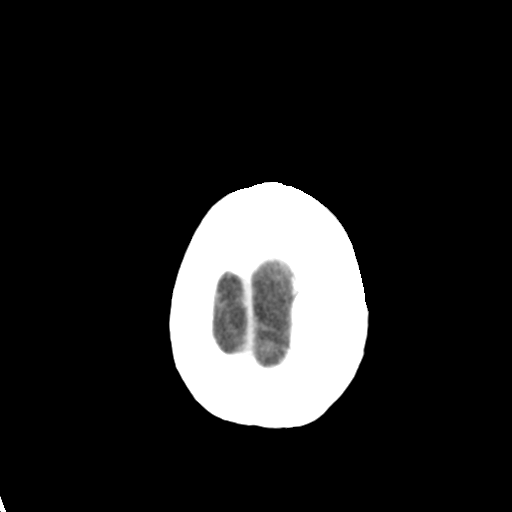

[Series 504: head bone · axial · 0.41mm/px · z∈[-117,-99]mm · 2 of 86 slices shown]
[im 9/86  bone]
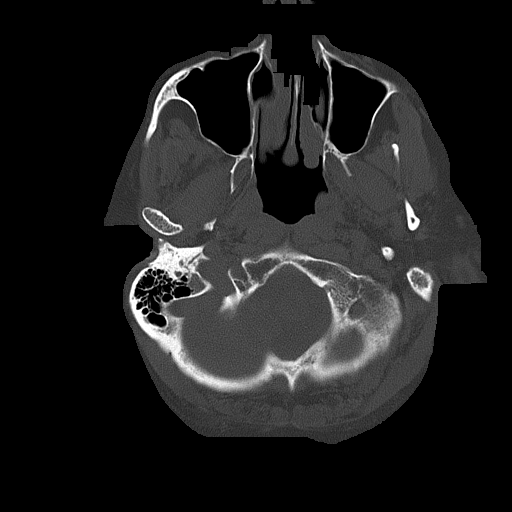
[im 18/86  bone]
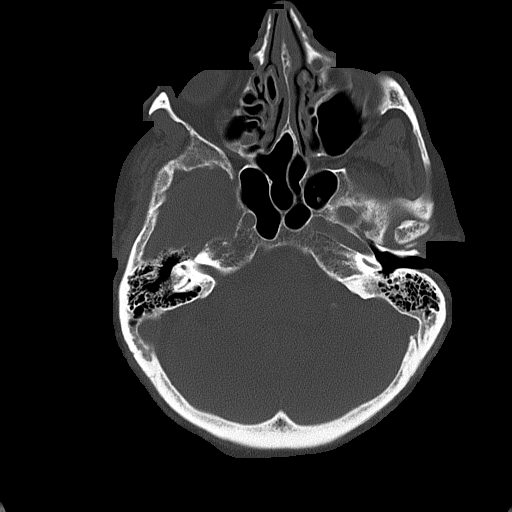

[Series 505: head without cor · coronal · non-contrast · 0.33mm/px · 3 of 67 slices shown]
[im 23/67  brain]
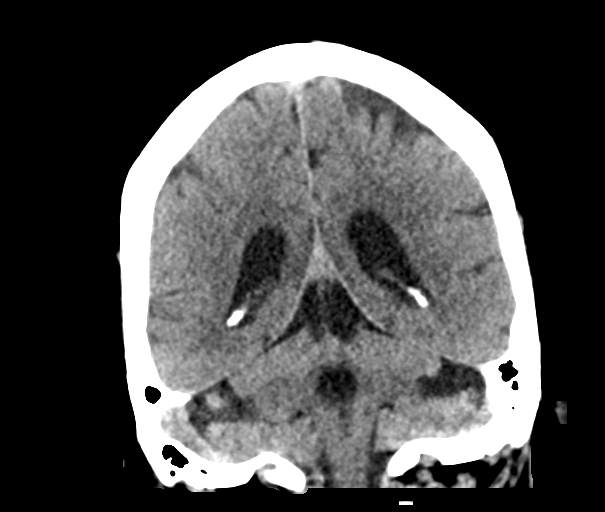
[im 30/67  brain]
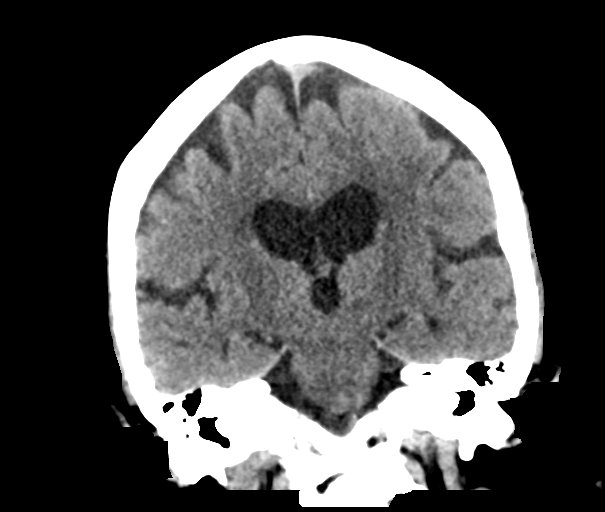
[im 37/67  brain]
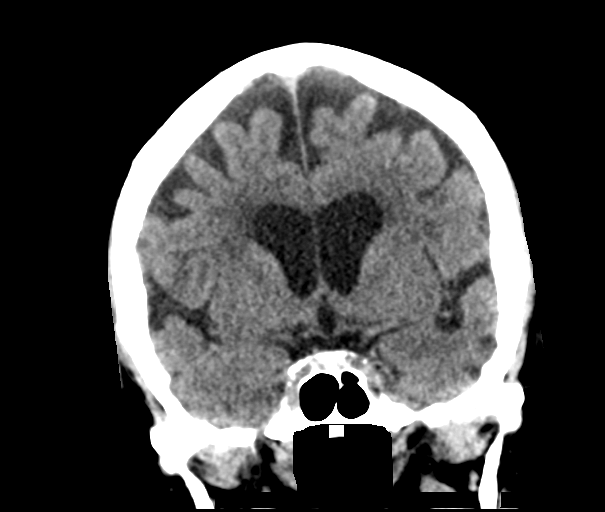

[Series 506: head without sag · sagittal · non-contrast · 0.33mm/px · 3 of 62 slices shown]
[im 21/62  brain]
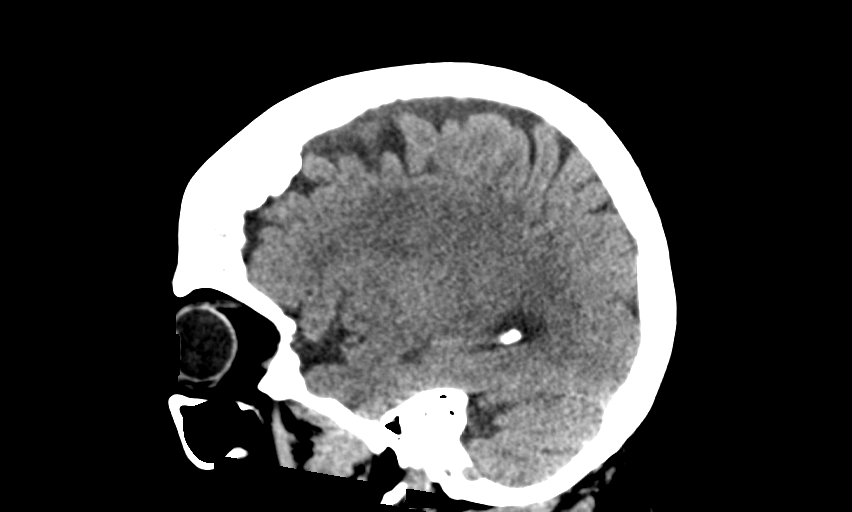
[im 31/62  brain]
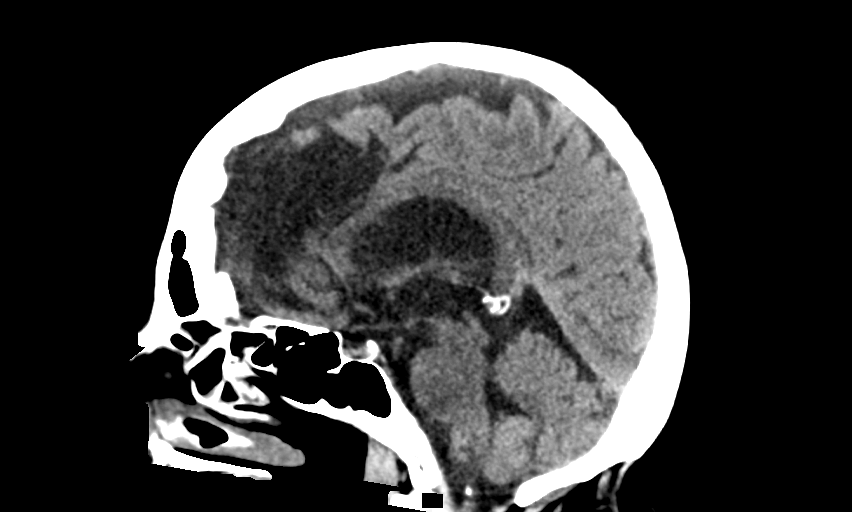
[im 41/62  brain]
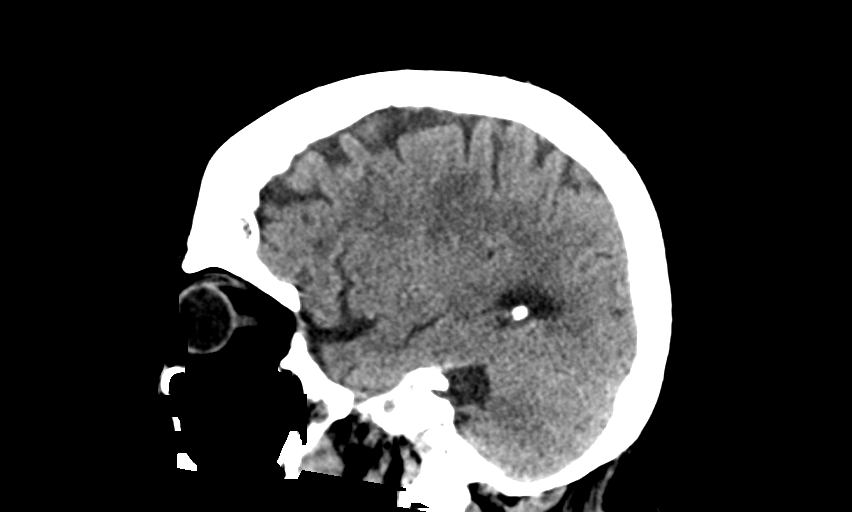

[15 of 47 positions shown; findings below may reference images not displayed]

FINDINGS: Brain: Generalized atrophy with mild chronic small vessel ischemia.
No intracranial hemorrhage, mass effect, or midline shift. No
hydrocephalus. The basilar cisterns are patent. No evidence of
territorial infarct or acute ischemia. No extra-axial or
intracranial fluid collection.

Vascular: Atherosclerosis of skullbase vasculature without
hyperdense vessel or abnormal calcification.

Skull: No fracture or focal lesion.  Calvarial hyperostosis.

Sinuses/Orbits: No acute findings. Minor mucosal thickening of the
ethmoid air cells. Bilateral cataract resection. Mastoid air cells
are clear.

Other: None.
IMPRESSION: 1. No acute intracranial abnormality. No skull fracture.
2. Generalized atrophy and chronic small vessel ischemia.

## 2022-08-15 IMAGING — DX DG CHEST 1V PORT
1 series · 1 of 1 positions shown · non-contrast
Comparison: 02/12/2019

CLINICAL DATA: Hypoxia

EXAM:
PORTABLE CHEST 1 VIEW

[chest ap]
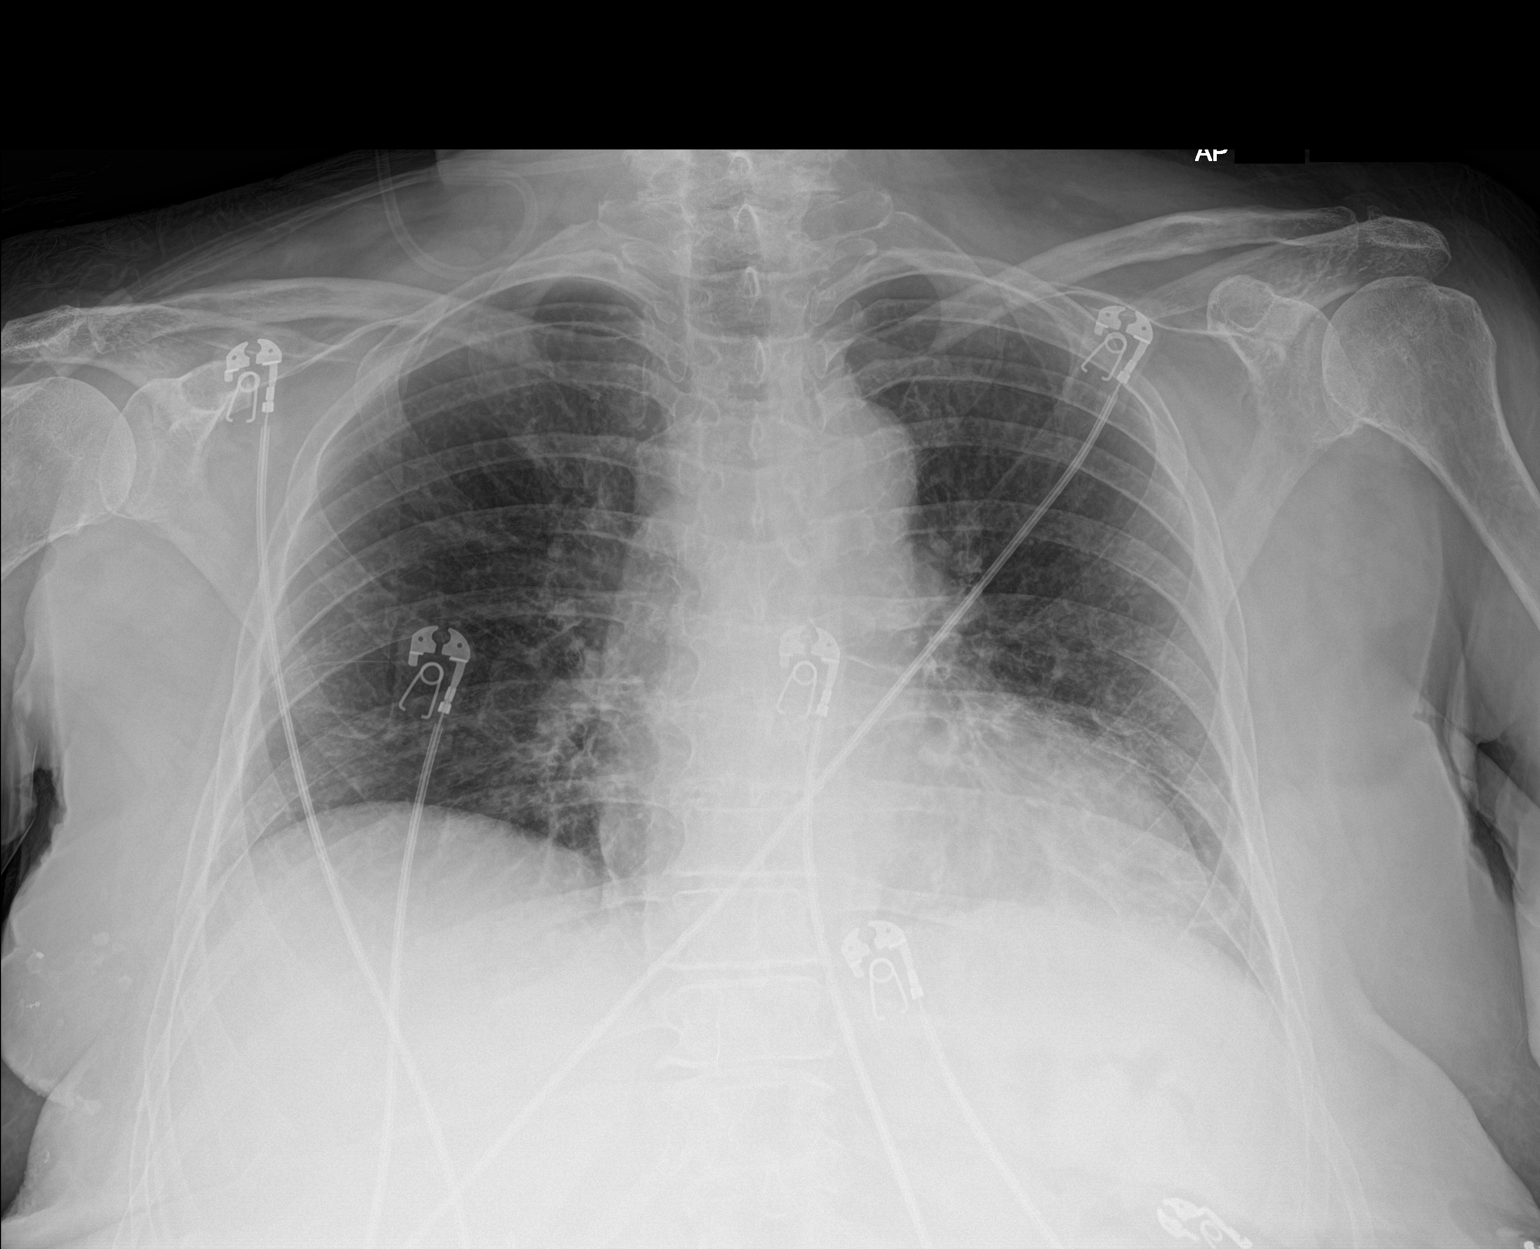

[1 of 1 positions shown; findings below may reference images not displayed]

FINDINGS: Low volume AP portable examination. Mild cardiomegaly. Mild diffuse
interstitial opacity most conspicuous at the lung bases. The
visualized skeletal structures are unremarkable.
IMPRESSION: Mild cardiomegaly with mild diffuse interstitial opacity most
conspicuous at the lung bases, likely mild edema or atypical/viral
infection. No focal airspace opacity.

## 2022-08-18 ENCOUNTER — Telehealth: Payer: Self-pay

## 2022-08-18 NOTE — Patient Outreach (Signed)
  Care Coordination   08/18/2022 Name: [name] MRN: [id number] DOB: [birth year]   Care Coordination Outreach Attempts:  An unsuccessful telephone outreach was attempted today to offer the patient information about available care coordination services as a benefit of their health plan.   Follow Up Plan:  Additional outreach attempts will be made to offer the patient care coordination information and services.   Encounter Outcome:  No Answer  Care Coordination Interventions Activated:  No   Care Coordination Interventions:  No, not indicated    [facility] [phone]

## 2022-08-19 DIAGNOSIS — M25562 Pain in left knee: Secondary | ICD-10-CM | POA: Diagnosis not present

## 2022-08-19 DIAGNOSIS — Z96652 Presence of left artificial knee joint: Secondary | ICD-10-CM | POA: Diagnosis not present

## 2022-08-19 DIAGNOSIS — M6281 Muscle weakness (generalized): Secondary | ICD-10-CM | POA: Diagnosis not present

## 2022-08-19 DIAGNOSIS — R262 Difficulty in walking, not elsewhere classified: Secondary | ICD-10-CM | POA: Diagnosis not present

## 2022-08-21 DIAGNOSIS — M6281 Muscle weakness (generalized): Secondary | ICD-10-CM | POA: Diagnosis not present

## 2022-08-21 DIAGNOSIS — M25562 Pain in left knee: Secondary | ICD-10-CM | POA: Diagnosis not present

## 2022-08-21 DIAGNOSIS — R262 Difficulty in walking, not elsewhere classified: Secondary | ICD-10-CM | POA: Diagnosis not present

## 2022-08-21 DIAGNOSIS — Z96652 Presence of left artificial knee joint: Secondary | ICD-10-CM | POA: Diagnosis not present

## 2022-08-22 DIAGNOSIS — R262 Difficulty in walking, not elsewhere classified: Secondary | ICD-10-CM | POA: Diagnosis not present

## 2022-08-22 DIAGNOSIS — M6281 Muscle weakness (generalized): Secondary | ICD-10-CM | POA: Diagnosis not present

## 2022-08-22 DIAGNOSIS — Z96652 Presence of left artificial knee joint: Secondary | ICD-10-CM | POA: Diagnosis not present

## 2022-08-22 DIAGNOSIS — M25562 Pain in left knee: Secondary | ICD-10-CM | POA: Diagnosis not present

## 2022-08-25 DIAGNOSIS — M25562 Pain in left knee: Secondary | ICD-10-CM | POA: Diagnosis not present

## 2022-08-25 DIAGNOSIS — M6281 Muscle weakness (generalized): Secondary | ICD-10-CM | POA: Diagnosis not present

## 2022-08-25 DIAGNOSIS — R262 Difficulty in walking, not elsewhere classified: Secondary | ICD-10-CM | POA: Diagnosis not present

## 2022-08-25 DIAGNOSIS — Z96652 Presence of left artificial knee joint: Secondary | ICD-10-CM | POA: Diagnosis not present

## 2022-08-27 DIAGNOSIS — Z471 Aftercare following joint replacement surgery: Secondary | ICD-10-CM | POA: Diagnosis not present

## 2022-08-27 DIAGNOSIS — Z96652 Presence of left artificial knee joint: Secondary | ICD-10-CM | POA: Diagnosis not present

## 2022-08-28 ENCOUNTER — Telehealth: Payer: Self-pay

## 2022-08-28 NOTE — Patient Outreach (Signed)
  Care Coordination   Initial Visit Note   08/28/2022 Name: [name] MRN: [id number] DOB: Oct 26, 1941  [name] is a 81 y.o. year old female who sees Charlane Ferretti, MD for primary care. I spoke with  [name] by phone today.  What matters to the patients health and wellness today?  No Concerns Expressed    Goals Addressed             This Visit's Progress    COMPLETED: Care Coordination Activities       Care Coordination Interventions: Reviewed plan for disease management. Reports adhering to plan. Reports attending medical appointments as scheduled. Reports knee replacement in August 2023. Reports following activity restrictions and using assistive device with ambulation as advised. Discussed ED visit on 07/11/22 d/t slurred speech and altered mental status. Reports doing well since being evaluated. Denies experienced symptoms since ED visit. Aware of indications for notifying EMS. Reviewed medications. Reports managing well. Denies concerns r/t medication management or prescription cost. Assessed social determinant of health barriers. AWV completed on 11/20/21. Reports pending AWV appointment in December.         SDOH assessments and interventions completed:  Yes  SDOH Interventions    Flowsheet Row Telephone from 08/28/2022 in [facility] Coordination  SDOH Interventions   Food Insecurity Interventions Intervention Not Indicated  Transportation Interventions Intervention Not Indicated         Care Coordination Interventions Activated:    Care Coordination Interventions:     Follow up plan:  [name] agreed to call for care coordination assistance if needed.    Encounter Outcome:  Pt. Visit Completed   [facility] [phone]

## 2022-08-28 NOTE — Patient Instructions (Signed)
Visit Information  Thank you for taking time to speak with me today. Please do not hesitate to call if you require assistance.  Following are the goals we discussed today:   Goals Addressed             This Visit's Progress    COMPLETED: Care Coordination Activities       Care Coordination Interventions: Reviewed plan for disease management. Reports adhering to plan. Reports attending medical appointments as scheduled. Reports knee replacement in August 2023. Reports following activity restrictions and using assistive device with ambulation as advised. Discussed ED visit on 07/11/22 d/t slurred speech and altered mental status. Reports doing well since being evaluated. Denies experienced symptoms since ED visit. Aware of indications for notifying EMS. Reviewed medications. Reports managing well. Denies concerns r/t medication management or prescription cost. Assessed social determinant of health barriers. AWV completed on 11/20/21. Reports pending AWV appointment in December.         [name] verbalized understanding of the information discussed during the telephonic outreach. Declined need for mailed instructions or resources.   [name] agreed to call for care coordination assistance if needed.  [facility] [phone]

## 2022-09-04 NOTE — Progress Notes (Deleted)
Virtual Visit via Video Note  Consent was obtained for video visit:  Yes.   Answered questions that patient had about telehealth interaction:  Yes.   I discussed the limitations, risks, security and privacy concerns of performing an evaluation and management service by telemedicine. I also discussed with the patient that there may be a patient responsible charge related to this service. The patient expressed understanding and agreed to proceed.  Pt location: Home Physician Location: office Name of referring provider:  [name], MD I connected with [name] at patients initiation/request on 09/08/2022 at  8:50 AM EDT by video enabled telemedicine application and verified that I am speaking with the correct person using two identifiers. Pt MRN:  [id number] Pt DOB:  08-02-1941 Video Participants:  [name]  Assessment and Plan:   1.  Cervicogenic headache 2.  Cervical spondylosis   1.  Gabapentin '200mg'$  at bedtime 2.  Follow up one year    History of Present Illness:  [name] is an 76 year right-handed old female with HTN, HLD, hypothyroidism, Bipolar 1 disorder, schizoaffective disorder, Alzheimer's disease and history of breast cancer who follows up for headache.   UPDATE: ***   Current NSAIDS:  Ibuprofen '400mg'$  twice daily with food Current analgesics:  Tylenol, hydrocodone-acetaminophen Current triptans:  none Current ergotamine:  none Current anti-emetic:  Zofran '4mg'$  Current muscle relaxants:  none Current anti-anxiolytic:  alprazolam Current sleep aide:  ramelteon '8mg'$  Current Antihypertensive medications:  Metoprolol tartrate '25mg'$  daily, spironolactone Current Antidepressant/antipsychotic medications:  Olanzapine '15mg'$  twice daily, Seroquel '25mg'$  daily Current Anticonvulsant medications:   Gabapentin 200 mg at bedtime; lamotrigine '150mg'$  daily Current anti-CGRP:  none Current Vitamins/Herbal/Supplements:  D, Fish  Oil, MVI Current  Antihistamines/Decongestants:  none Other therapy:  none Hormone/birth control:  none Other medications:  Synthroid, memantine.  She stopped donepezil and nausea resolved.     HISTORY:  Onset:  Around April 2020.  No preceding event. Location:  Back of head, usually occurs at night, not during the day.  Non-radiating. Quality:  Non-throbbing Initial intensity:  Mild to moderate.  She denies thunderclap headache Aura:  none Premonitory Phase:  none Postdrome:  none Associated symptoms:  She denies associated scalp paresthesias/allodynia, nausea, vomiting, photophobia, phonophobia, visual disturbance or unilateral numbness or weakness. Initial duration:  Feels it as she goes to bed and falls asleep. If she wakes up throughout the night, she may feel it.  Once she gets up in the morning, she feels okay.  No headaches during the day.   Initial Frequency:  Almost every night Triggers:  Laying down Relieving factors:  Getting up. Activity:  Does not aggravate She followed up with her orthopedist, Dr. Gladstone Lighter, who ordered a cervical X-ray and was told she had multilevel arthritis.  She was prescribed ibuprofen.       Past imaging (personally reviewed): 04/27/2013 CT Head:  Atrophy and mild chronic small vessel ischemic changes in white matter. No acute intracranial abnormality. 04/27/2013 CT Cervical Spine:  Moderate to advanced cervical degenerative disease with spondylosis and facet hypertrophy as well as multilevel spinal stenosis most prominent at C5-6 and C6-7, as well as foraminal stenosis bilaterally most prominent at C3-4 and C4-5 and on right at C5-6. 07/05/2009 MRI Brain w/o:  Chronic small vessel ischemic changes in hemispheric white matter and frontal and temporal atrophy.  No acute intracranial abnormality. 10/11/2008 MRI Brain w/o:  Frontal and temporal atrophy and chronic small vessel ischemic changes in hemispheric white matter.  No  acute intracranial abnormality.   Past NSAIDS:   Ibuprofen '800mg'$  twice daily (made her feel unsteady with slurred speech), naproxen '220mg'$  Past analgesics: none Past abortive triptans:  none Past abortive ergotamine:  none Past muscle relaxants:  Tizanidine '4mg'$  at bedtime (ineffective) Past anti-emetic:  none Past antihypertensive medications:  none Past antidepressant medications:  none Past anticonvulsant medications:  none Past anti-CGRP:  none Past vitamins/Herbal/Supplements:  none Past antihistamines/decongestants:  none Other past therapies:  none  Past Medical History: Past Medical History:  Diagnosis Date   Adrenal insufficiency (HCC) 07/25/2021   Anxiety    Arrhythmia    right bundle branch block   Bipolar 1 disorder (HCC)    Cancer ([place])    right breast cancer   Depression    Dyspnea    with exertion   GERD (gastroesophageal reflux disease)    Headache    Heart murmur    Hyperlipidemia    Hypertension    Hypothyroidism    Morbid obesity (HCC)    Osteoarthritis    Pneumonia    PONV (postoperative nausea and vomiting)    Schizo-affective schizophrenia ([place])    Thyroid disease    hypothyroidism    Medications: Outpatient Encounter Medications as of 09/08/2022  Medication Sig   acetaminophen (TYLENOL) 500 MG tablet Take 2 tablets (1,000 mg total) by mouth every 8 (eight) hours.   albuterol (VENTOLIN HFA) 108 (90 Base) MCG/ACT inhaler Inhale 2 puffs into the lungs every 6 (six) hours as needed for wheezing or shortness of breath. (Patient not taking: Reported on 03/04/2022)   amLODipine (NORVASC) 2.5 MG tablet Take 2.5 mg by mouth at bedtime.   Ascorbic Acid (VITAMIN C PO) Take 500 mg by mouth every morning.   atorvastatin (LIPITOR) 40 MG tablet Take 40 mg by mouth at bedtime.   Biotin 10 MG TABS Take 10,000 mcg by mouth every morning.   Calcium Carbonate Antacid (CALCIUM CARBONATE PO) Take 600 mg by mouth every morning.   Cholecalciferol (VITAMIN D) 50 MCG (2000 UT) tablet Take 2,000 Units by mouth daily.    co-enzyme Q-10 30 MG capsule Take 30 mg by mouth daily.   Cyanocobalamin (VITAMIN B-12 PO) Take 2,500 mcg by mouth every morning.   docusate sodium (COLACE) 100 MG capsule Take 1 capsule (100 mg total) by mouth 2 (two) times daily.   gabapentin (NEURONTIN) 100 MG capsule TAKE 2 CAPSULES BY MOUTH AT BEDTIME (Patient taking differently: 200 mg 3 (three) times daily.)   hydrocortisone (CORTEF) 5 MG tablet Take 1 tablet (5 mg total) by mouth as directed. 2 tabs with breakfast, and 1 tablet in the afternoon   lamoTRIgine (LAMICTAL) 200 MG tablet Take 200 mg by mouth at bedtime.   levothyroxine (SYNTHROID) 125 MCG tablet Take 1 tablet (125 mcg total) by mouth as directed. 1.5 tablet on Sundays, 1 tablet Monday through Saturday (Patient taking differently: Take 125 mcg by mouth daily before breakfast.)   memantine (NAMENDA) 10 MG tablet Take 10 mg by mouth 2 (two) times daily.   methocarbamol (ROBAXIN) 500 MG tablet Take 1 tablet (500 mg total) by mouth every 6 (six) hours as needed for muscle spasms (muscle pain).   metoprolol tartrate (LOPRESSOR) 25 MG tablet Take 25 mg by mouth 2 (two) times daily.   Multiple Vitamin (MULTIVITAMIN WITH MINERALS) TABS tablet Take 1 tablet by mouth every morning.   OLANZapine (ZYPREXA) 15 MG tablet Take 30 mg by mouth at bedtime.   Omega-3 Fatty Acids (FISH OIL) 1200  MG CAPS Take 1,200 mg by mouth every morning.   omeprazole (PRILOSEC) 40 MG capsule Take 40 mg by mouth in the morning.   oxyCODONE (OXY IR/ROXICODONE) 5 MG immediate release tablet Take 1 tablet (5 mg total) by mouth every 4 (four) hours as needed for severe pain. (Patient not taking: Reported on 08/04/2022)   polyethylene glycol (MIRALAX / GLYCOLAX) 17 g packet Take 17 g by mouth daily as needed for mild constipation.   polyvinyl alcohol (LIQUIFILM TEARS) 1.4 % ophthalmic solution Place 1 drop into both eyes as needed for dry eyes.   ramelteon (ROZEREM) 8 MG tablet Take 8 mg by mouth at bedtime.    rosuvastatin (CRESTOR) 20 MG tablet Take 1 tablet (20 mg total) by mouth daily. Need labs from PCP   spironolactone (ALDACTONE) 25 MG tablet TAKE 1 TABLET (25 MG TOTAL) BY MOUTH DAILY.   traMADol (ULTRAM) 50 MG tablet Take 50-100 mg by mouth every 6 (six) hours as needed.   traZODone (DESYREL) 50 MG tablet Take 150 mg by mouth at bedtime.   No facility-administered encounter medications on file as of 09/08/2022.    Allergies: Allergies  Allergen Reactions   Lithium Nausea Only   Fluoxetine Other (See Comments)    Pt felt crazy   Macrodantin Other (See Comments)    Unknown reaction   Mirabegron Other (See Comments)    ineffective   Nitrofurantoin Other (See Comments)    Other reaction(s): Did not agree with me   Paxil [Paroxetine] Other (See Comments)    Made pt feel crazy    Prednisone Other (See Comments)    Makes her feel very jittery   Prozac [Fluoxetine Hcl] Other (See Comments)    Pt felt crazy    Sertraline Hcl Other (See Comments)    Unknown reaction   Solifenacin Other (See Comments)    Ineffective    Sulfa Antibiotics Other (See Comments)    Unknown reaction   Wellbutrin [Bupropion Hcl] Other (See Comments)    Unknown reaction    Family History: Family History  Problem Relation Age of Onset   Hypertension Mother    Lung cancer Brother    Schizophrenia Brother     Observations/Objective:   *** No acute distress.  Alert and oriented.  Speech fluent and not dysarthric.  Language intact.     Follow Up Instructions:    -I discussed the assessment and treatment plan with the patient. The patient was provided an opportunity to ask questions and all were answered. The patient agreed with the plan and demonstrated an understanding of the instructions.   The patient was advised to call back or seek an in-person evaluation if the symptoms worsen or if the condition fails to improve as anticipated.   Dudley Major, DO

## 2022-09-08 ENCOUNTER — Telehealth: Payer: Medicare HMO | Admitting: Neurology

## 2022-09-09 NOTE — Progress Notes (Signed)
Virtual Visit via Video Note  Consent was obtained for video visit:  Yes.   Answered questions that patient had about telehealth interaction:  Yes.   I discussed the limitations, risks, security and privacy concerns of performing an evaluation and management service by telemedicine. I also discussed with the patient that there may be a patient responsible charge related to this service. The patient expressed understanding and agreed to proceed.  Pt location: Home Physician Location: office Name of referring provider:  Charlane Ferretti, MD I connected with Eden Emms Rister at patients initiation/request on 09/12/2022 at 10:30 AM EDT by video enabled telemedicine application and verified that I am speaking with the correct person using two identifiers. Pt MRN:  [id number] Pt DOB:  1941-02-09 Video Participants:  Eden Emms Hackert  Assessment and Plan:   1.  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  Cervical spondylosis   1.  Increase gabapentin to '300mg'$  at bedtime to achieve further reduction of headache frequency. 2.  May use Tylenol or ibuprofen for rescue treatment, just limit use of pain relievers to no more than 2 days out of week to prevent risk of rebound or medication-overuse headache. 3.  Follow up one year    History of Present Illness:  [name] is an 19 year right-handed old female with HTN, HLD, hypothyroidism, Bipolar 1 disorder, schizoaffective disorder, Alzheimer's disease and history of breast cancer who follows up for headache.   UPDATE: Headaches are 3 times a week. They last several hours but does not take any pain reliever   Current NSAIDS:  Ibuprofen '400mg'$  twice daily with food Current analgesics:  Tylenol Current triptans:  none Current ergotamine:  none Current anti-emetic:  Zofran '4mg'$  Current muscle relaxants:  Robaxin Current anti-anxiolytic:  alprazolam Current sleep aide:  ramelteon '8mg'$  Current Antihypertensive medications:  Metoprolol tartrate '25mg'$   daily, spironolactone Current Antidepressant/antipsychotic medications:  Olanzapine '15mg'$  twice daily, Seroquel '25mg'$  daily Current Anticonvulsant medications:   Gabapentin 200 mg at bedtime; lamotrigine '150mg'$  daily Current anti-CGRP:  none Current Vitamins/Herbal/Supplements:  D, Fish  Oil, MVI Current Antihistamines/Decongestants:  none Other therapy:  none Hormone/birth control:  none Other medications:  Synthroid, memantine.  She stopped donepezil and nausea resolved.     HISTORY:  Onset:  Around April 2020.  No preceding event. Location:  Back of head, usually occurs at night, not during the day.  Non-radiating. Quality:  Non-throbbing Initial intensity:  Mild to moderate.  She denies thunderclap headache Aura:  none Premonitory Phase:  none Postdrome:  none Associated symptoms:  She denies associated scalp paresthesias/allodynia, nausea, vomiting, photophobia, phonophobia, visual disturbance or unilateral numbness or weakness. Initial duration:  Feels it as she goes to bed and falls asleep. If she wakes up throughout the night, she may feel it.  Once she gets up in the morning, she feels okay.  No headaches during the day.   Initial Frequency:  Almost every night Triggers:  Laying down Relieving factors:  Getting up. Activity:  Does not aggravate She followed up with her orthopedist, Dr. Gladstone Lighter, who ordered a cervical X-ray and was told she had multilevel arthritis.  She was prescribed ibuprofen.       Past imaging (personally reviewed): 04/27/2013 CT Head:  Atrophy and mild chronic small vessel ischemic changes in white matter. No acute intracranial abnormality. 04/27/2013 CT Cervical Spine:  Moderate to advanced cervical degenerative disease with spondylosis and facet hypertrophy as well as multilevel spinal stenosis most prominent at C5-6 and C6-7, as  well as foraminal stenosis bilaterally most prominent at C3-4 and C4-5 and on right at C5-6. 07/05/2009 MRI Brain w/o:  Chronic  small vessel ischemic changes in hemispheric white matter and frontal and temporal atrophy.  No acute intracranial abnormality. 10/11/2008 MRI Brain w/o:  Frontal and temporal atrophy and chronic small vessel ischemic changes in hemispheric white matter.  No acute intracranial abnormality.   Past NSAIDS:  Ibuprofen '800mg'$  twice daily (made her feel unsteady with slurred speech), naproxen '220mg'$  Past analgesics: none Past abortive triptans:  none Past abortive ergotamine:  none Past muscle relaxants:  Tizanidine '4mg'$  at bedtime (ineffective) Past anti-emetic:  none Past antihypertensive medications:  none Past antidepressant medications:  none Past anticonvulsant medications:  none Past anti-CGRP:  none Past vitamins/Herbal/Supplements:  none Past antihistamines/decongestants:  none Other past therapies:  none  Past Medical History: Past Medical History:  Diagnosis Date   Adrenal insufficiency (HCC) 07/25/2021   Anxiety    Arrhythmia    right bundle branch block   Bipolar 1 disorder ([place])    Cancer ([place])    right breast cancer   Depression    Dyspnea    with exertion   GERD (gastroesophageal reflux disease)    Headache    Heart murmur    Hyperlipidemia    Hypertension    Hypothyroidism    Morbid obesity (HCC)    Osteoarthritis    Pneumonia    PONV (postoperative nausea and vomiting)    Schizo-affective schizophrenia ([place])    Thyroid disease    hypothyroidism    Medications: Outpatient Encounter Medications as of 09/12/2022  Medication Sig   acetaminophen (TYLENOL) 500 MG tablet Take 2 tablets (1,000 mg total) by mouth every 8 (eight) hours.   albuterol (VENTOLIN HFA) 108 (90 Base) MCG/ACT inhaler Inhale 2 puffs into the lungs every 6 (six) hours as needed for wheezing or shortness of breath. (Patient not taking: Reported on 03/04/2022)   amLODipine (NORVASC) 2.5 MG tablet Take 2.5 mg by mouth at bedtime.   Ascorbic Acid (VITAMIN C PO) Take 500 mg by mouth every morning.    atorvastatin (LIPITOR) 40 MG tablet Take 40 mg by mouth at bedtime.   Biotin 10 MG TABS Take 10,000 mcg by mouth every morning.   Calcium Carbonate Antacid (CALCIUM CARBONATE PO) Take 600 mg by mouth every morning.   Cholecalciferol (VITAMIN D) 50 MCG (2000 UT) tablet Take 2,000 Units by mouth daily.   co-enzyme Q-10 30 MG capsule Take 30 mg by mouth daily.   Cyanocobalamin (VITAMIN B-12 PO) Take 2,500 mcg by mouth every morning.   docusate sodium (COLACE) 100 MG capsule Take 1 capsule (100 mg total) by mouth 2 (two) times daily.   gabapentin (NEURONTIN) 100 MG capsule TAKE 2 CAPSULES BY MOUTH AT BEDTIME (Patient taking differently: 200 mg 3 (three) times daily.)   hydrocortisone (CORTEF) 5 MG tablet Take 1 tablet (5 mg total) by mouth as directed. 2 tabs with breakfast, and 1 tablet in the afternoon   lamoTRIgine (LAMICTAL) 200 MG tablet Take 200 mg by mouth at bedtime.   levothyroxine (SYNTHROID) 125 MCG tablet Take 1 tablet (125 mcg total) by mouth as directed. 1.5 tablet on Sundays, 1 tablet Monday through Saturday (Patient taking differently: Take 125 mcg by mouth daily before breakfast.)   memantine (NAMENDA) 10 MG tablet Take 10 mg by mouth 2 (two) times daily.   methocarbamol (ROBAXIN) 500 MG tablet Take 1 tablet (500 mg total) by mouth every 6 (six) hours as  needed for muscle spasms (muscle pain).   metoprolol tartrate (LOPRESSOR) 25 MG tablet Take 25 mg by mouth 2 (two) times daily.   Multiple Vitamin (MULTIVITAMIN WITH MINERALS) TABS tablet Take 1 tablet by mouth every morning.   OLANZapine (ZYPREXA) 15 MG tablet Take 30 mg by mouth at bedtime.   Omega-3 Fatty Acids (FISH OIL) 1200 MG CAPS Take 1,200 mg by mouth every morning.   omeprazole (PRILOSEC) 40 MG capsule Take 40 mg by mouth in the morning.   oxyCODONE (OXY IR/ROXICODONE) 5 MG immediate release tablet Take 1 tablet (5 mg total) by mouth every 4 (four) hours as needed for severe pain. (Patient not taking: Reported on 08/04/2022)    polyethylene glycol (MIRALAX / GLYCOLAX) 17 g packet Take 17 g by mouth daily as needed for mild constipation.   polyvinyl alcohol (LIQUIFILM TEARS) 1.4 % ophthalmic solution Place 1 drop into both eyes as needed for dry eyes.   ramelteon (ROZEREM) 8 MG tablet Take 8 mg by mouth at bedtime.   rosuvastatin (CRESTOR) 20 MG tablet Take 1 tablet (20 mg total) by mouth daily. Need labs from PCP   spironolactone (ALDACTONE) 25 MG tablet TAKE 1 TABLET (25 MG TOTAL) BY MOUTH DAILY.   traMADol (ULTRAM) 50 MG tablet Take 50-100 mg by mouth every 6 (six) hours as needed.   traZODone (DESYREL) 50 MG tablet Take 150 mg by mouth at bedtime.   No facility-administered encounter medications on file as of 09/12/2022.    Allergies: Allergies  Allergen Reactions   Lithium Nausea Only   Fluoxetine Other (See Comments)    Pt felt crazy   Macrodantin Other (See Comments)    Unknown reaction   Mirabegron Other (See Comments)    ineffective   Nitrofurantoin Other (See Comments)    Other reaction(s): Did not agree with me   Paxil [Paroxetine] Other (See Comments)    Made pt feel crazy    Prednisone Other (See Comments)    Makes her feel very jittery   Prozac [Fluoxetine Hcl] Other (See Comments)    Pt felt crazy    Sertraline Hcl Other (See Comments)    Unknown reaction   Solifenacin Other (See Comments)    Ineffective    Sulfa Antibiotics Other (See Comments)    Unknown reaction   Wellbutrin [Bupropion Hcl] Other (See Comments)    Unknown reaction    Family History: Family History  Problem Relation Age of Onset   Hypertension Mother    Lung cancer Brother    Schizophrenia Brother     Observations/Objective:   Height '5\' 2"'$  (1.575 m), weight 170 lb (77.1 kg). No acute distress.  Alert and oriented.  Speech fluent and not dysarthric.  Language intact.     Follow Up Instructions:    -I discussed the assessment and treatment plan with the patient. The patient was provided an opportunity  to ask questions and all were answered. The patient agreed with the plan and demonstrated an understanding of the instructions.   The patient was advised to call back or seek an in-person evaluation if the symptoms worsen or if the condition fails to improve as anticipated.   Dudley Major, DO

## 2022-09-11 ENCOUNTER — Telehealth: Payer: Medicare HMO | Admitting: Neurology

## 2022-09-11 ENCOUNTER — Ambulatory Visit: Payer: Medicare HMO | Admitting: Neurology

## 2022-09-12 ENCOUNTER — Telehealth (INDEPENDENT_AMBULATORY_CARE_PROVIDER_SITE_OTHER): Payer: Medicare HMO | Admitting: Neurology

## 2022-09-12 ENCOUNTER — Encounter: Payer: Self-pay | Admitting: Neurology

## 2022-09-12 VITALS — Ht 62.0 in | Wt 170.0 lb

## 2022-09-12 DIAGNOSIS — G4486 Cervicogenic headache: Secondary | ICD-10-CM | POA: Diagnosis not present

## 2022-09-12 MED ORDER — GABAPENTIN 100 MG PO CAPS: 300.0000 mg | ORAL_CAPSULE | Freq: Every day | ORAL | 5 refills | Status: DC

## 2022-09-12 NOTE — Patient Instructions (Addendum)
Increase gabapentin to '300mg'$  at bedtime May take Tylenol or ibuprofen as needed for headache.  Just limit use of pain relievers to no more than 2 days out of week to prevent risk of rebound or medication-overuse headache. Follow up in one year

## 2022-09-15 DIAGNOSIS — Z96652 Presence of left artificial knee joint: Secondary | ICD-10-CM | POA: Diagnosis not present

## 2022-09-15 DIAGNOSIS — Z471 Aftercare following joint replacement surgery: Secondary | ICD-10-CM | POA: Diagnosis not present

## 2022-09-18 DIAGNOSIS — Z85828 Personal history of other malignant neoplasm of skin: Secondary | ICD-10-CM | POA: Diagnosis not present

## 2022-09-18 DIAGNOSIS — C44529 Squamous cell carcinoma of skin of other part of trunk: Secondary | ICD-10-CM | POA: Diagnosis not present

## 2022-09-18 DIAGNOSIS — C44622 Squamous cell carcinoma of skin of right upper limb, including shoulder: Secondary | ICD-10-CM | POA: Diagnosis not present

## 2022-09-18 DIAGNOSIS — L821 Other seborrheic keratosis: Secondary | ICD-10-CM | POA: Diagnosis not present

## 2022-09-26 DIAGNOSIS — G8929 Other chronic pain: Secondary | ICD-10-CM | POA: Diagnosis not present

## 2022-09-26 DIAGNOSIS — M25512 Pain in left shoulder: Secondary | ICD-10-CM | POA: Diagnosis not present

## 2022-09-26 DIAGNOSIS — H6122 Impacted cerumen, left ear: Secondary | ICD-10-CM | POA: Diagnosis not present

## 2022-09-26 DIAGNOSIS — R110 Nausea: Secondary | ICD-10-CM | POA: Diagnosis not present

## 2022-09-26 DIAGNOSIS — K121 Other forms of stomatitis: Secondary | ICD-10-CM | POA: Diagnosis not present

## 2022-10-02 DIAGNOSIS — M25512 Pain in left shoulder: Secondary | ICD-10-CM | POA: Diagnosis not present

## 2022-10-02 DIAGNOSIS — M6281 Muscle weakness (generalized): Secondary | ICD-10-CM | POA: Diagnosis not present

## 2022-10-06 DIAGNOSIS — M25512 Pain in left shoulder: Secondary | ICD-10-CM | POA: Diagnosis not present

## 2022-10-06 DIAGNOSIS — M6281 Muscle weakness (generalized): Secondary | ICD-10-CM | POA: Diagnosis not present

## 2022-10-07 DIAGNOSIS — Z6832 Body mass index (BMI) 32.0-32.9, adult: Secondary | ICD-10-CM | POA: Diagnosis not present

## 2022-10-07 DIAGNOSIS — Z1211 Encounter for screening for malignant neoplasm of colon: Secondary | ICD-10-CM | POA: Diagnosis not present

## 2022-10-07 DIAGNOSIS — M8580 Other specified disorders of bone density and structure, unspecified site: Secondary | ICD-10-CM | POA: Diagnosis not present

## 2022-10-07 DIAGNOSIS — Z01419 Encounter for gynecological examination (general) (routine) without abnormal findings: Secondary | ICD-10-CM | POA: Diagnosis not present

## 2022-10-07 DIAGNOSIS — Z853 Personal history of malignant neoplasm of breast: Secondary | ICD-10-CM | POA: Diagnosis not present

## 2022-10-08 DIAGNOSIS — M6281 Muscle weakness (generalized): Secondary | ICD-10-CM | POA: Diagnosis not present

## 2022-10-08 DIAGNOSIS — M25512 Pain in left shoulder: Secondary | ICD-10-CM | POA: Diagnosis not present

## 2022-10-13 DIAGNOSIS — M25512 Pain in left shoulder: Secondary | ICD-10-CM | POA: Diagnosis not present

## 2022-10-13 DIAGNOSIS — M6281 Muscle weakness (generalized): Secondary | ICD-10-CM | POA: Diagnosis not present

## 2022-10-15 DIAGNOSIS — M25512 Pain in left shoulder: Secondary | ICD-10-CM | POA: Diagnosis not present

## 2022-10-15 DIAGNOSIS — M6281 Muscle weakness (generalized): Secondary | ICD-10-CM | POA: Diagnosis not present

## 2022-10-20 DIAGNOSIS — M25512 Pain in left shoulder: Secondary | ICD-10-CM | POA: Diagnosis not present

## 2022-10-20 DIAGNOSIS — M6281 Muscle weakness (generalized): Secondary | ICD-10-CM | POA: Diagnosis not present

## 2022-10-20 DIAGNOSIS — R928 Other abnormal and inconclusive findings on diagnostic imaging of breast: Secondary | ICD-10-CM | POA: Diagnosis not present

## 2022-10-20 DIAGNOSIS — R921 Mammographic calcification found on diagnostic imaging of breast: Secondary | ICD-10-CM | POA: Diagnosis not present

## 2022-10-20 DIAGNOSIS — Z853 Personal history of malignant neoplasm of breast: Secondary | ICD-10-CM | POA: Diagnosis not present

## 2022-10-22 ENCOUNTER — Encounter: Payer: Self-pay | Admitting: Hematology and Oncology

## 2022-10-22 DIAGNOSIS — C50911 Malignant neoplasm of unspecified site of right female breast: Secondary | ICD-10-CM | POA: Diagnosis not present

## 2022-10-22 DIAGNOSIS — M6281 Muscle weakness (generalized): Secondary | ICD-10-CM | POA: Diagnosis not present

## 2022-10-22 DIAGNOSIS — M25512 Pain in left shoulder: Secondary | ICD-10-CM | POA: Diagnosis not present

## 2022-10-28 DIAGNOSIS — M6281 Muscle weakness (generalized): Secondary | ICD-10-CM | POA: Diagnosis not present

## 2022-10-28 DIAGNOSIS — M25512 Pain in left shoulder: Secondary | ICD-10-CM | POA: Diagnosis not present

## 2022-10-31 DIAGNOSIS — M25512 Pain in left shoulder: Secondary | ICD-10-CM | POA: Diagnosis not present

## 2022-10-31 DIAGNOSIS — M6281 Muscle weakness (generalized): Secondary | ICD-10-CM | POA: Diagnosis not present

## 2022-11-04 DIAGNOSIS — M25512 Pain in left shoulder: Secondary | ICD-10-CM | POA: Diagnosis not present

## 2022-11-04 DIAGNOSIS — M6281 Muscle weakness (generalized): Secondary | ICD-10-CM | POA: Diagnosis not present

## 2022-11-06 DIAGNOSIS — M6281 Muscle weakness (generalized): Secondary | ICD-10-CM | POA: Diagnosis not present

## 2022-11-06 DIAGNOSIS — M25512 Pain in left shoulder: Secondary | ICD-10-CM | POA: Diagnosis not present

## 2022-11-11 DIAGNOSIS — M6281 Muscle weakness (generalized): Secondary | ICD-10-CM | POA: Diagnosis not present

## 2022-11-11 DIAGNOSIS — M25512 Pain in left shoulder: Secondary | ICD-10-CM | POA: Diagnosis not present

## 2022-11-13 DIAGNOSIS — M25512 Pain in left shoulder: Secondary | ICD-10-CM | POA: Diagnosis not present

## 2022-11-13 DIAGNOSIS — M6281 Muscle weakness (generalized): Secondary | ICD-10-CM | POA: Diagnosis not present

## 2022-11-18 DIAGNOSIS — M25512 Pain in left shoulder: Secondary | ICD-10-CM | POA: Diagnosis not present

## 2022-11-18 DIAGNOSIS — M6281 Muscle weakness (generalized): Secondary | ICD-10-CM | POA: Diagnosis not present

## 2022-11-21 DIAGNOSIS — H40013 Open angle with borderline findings, low risk, bilateral: Secondary | ICD-10-CM | POA: Diagnosis not present

## 2022-11-21 DIAGNOSIS — H4323 Crystalline deposits in vitreous body, bilateral: Secondary | ICD-10-CM | POA: Diagnosis not present

## 2022-11-21 DIAGNOSIS — H35373 Puckering of macula, bilateral: Secondary | ICD-10-CM | POA: Diagnosis not present

## 2022-11-21 DIAGNOSIS — H00021 Hordeolum internum right upper eyelid: Secondary | ICD-10-CM | POA: Diagnosis not present

## 2022-11-21 DIAGNOSIS — H353132 Nonexudative age-related macular degeneration, bilateral, intermediate dry stage: Secondary | ICD-10-CM | POA: Diagnosis not present

## 2022-12-11 ENCOUNTER — Ambulatory Visit (INDEPENDENT_AMBULATORY_CARE_PROVIDER_SITE_OTHER): Payer: Medicare HMO | Admitting: Podiatry

## 2022-12-11 ENCOUNTER — Encounter: Payer: Self-pay | Admitting: Podiatry

## 2022-12-11 DIAGNOSIS — L600 Ingrowing nail: Secondary | ICD-10-CM | POA: Diagnosis not present

## 2022-12-11 NOTE — Progress Notes (Signed)
Subjective:   Patient ID: [name], female   DOB: 82 y.o.   MRN: [id number]   HPI Patient presents with caregiver with painful ingrown toenail deformity left hallux that is hard to wear shoe gear with and states that she has tried to trim it without relief.  Presents with no current history of smoking and tries to be active if possible   Review of Systems  All other systems reviewed and are negative.       Objective:  Physical Exam Vitals and nursing note reviewed.  Constitutional:      Appearance: She is well-developed.  Pulmonary:     Effort: Pulmonary effort is normal.  Musculoskeletal:        General: Normal range of motion.  Skin:    General: Skin is warm.  Neurological:     Mental Status: She is alert.     Neurovascular status intact muscle strength found to be adequate range of motion within normal limits with incurvated medial border left hallux painful when pressed moderate thickness of the nailbed with no active drainage or redness noted.  Good digital perfusion well-oriented x 3     Assessment:  Ingrown toenail deformity left hallux medial border with pain     Plan:  H&P reviewed condition explained to her and caregiver of the condition and treatment and I do think removal of the ingrown toenail would be best for her.  Patient wants to have this done read then signed consent form and today I infiltrated the left hallux 60 mg like Marcaine mixture sterile prep done using sterile instrumentation remove the medial border exposed matrix applied phenol 3 applications 30 seconds followed by alcohol lavage sterile dressing gave instructions on soaks and to wear dressing 24 hours but take it off earlier if throbbing were to occur.  Encouraged to call questions concerns

## 2022-12-11 NOTE — Patient Instructions (Signed)

## 2022-12-13 DIAGNOSIS — R1032 Left lower quadrant pain: Secondary | ICD-10-CM | POA: Diagnosis not present

## 2022-12-13 DIAGNOSIS — M25552 Pain in left hip: Secondary | ICD-10-CM | POA: Diagnosis not present

## 2022-12-13 DIAGNOSIS — M79652 Pain in left thigh: Secondary | ICD-10-CM | POA: Diagnosis not present

## 2022-12-13 DIAGNOSIS — Z79899 Other long term (current) drug therapy: Secondary | ICD-10-CM | POA: Insufficient documentation

## 2022-12-13 DIAGNOSIS — R1030 Lower abdominal pain, unspecified: Secondary | ICD-10-CM | POA: Insufficient documentation

## 2022-12-13 DIAGNOSIS — R457 State of emotional shock and stress, unspecified: Secondary | ICD-10-CM | POA: Diagnosis not present

## 2022-12-13 NOTE — ED Triage Notes (Signed)
Patient arrived with EMS from [facility] , reports left groin pain and left foot numbness onset this evening , history of left knee replacement / neuropathy .

## 2022-12-14 ENCOUNTER — Emergency Department (HOSPITAL_COMMUNITY): Payer: Medicare HMO

## 2022-12-14 ENCOUNTER — Other Ambulatory Visit: Payer: Self-pay

## 2022-12-14 ENCOUNTER — Emergency Department (HOSPITAL_BASED_OUTPATIENT_CLINIC_OR_DEPARTMENT_OTHER): Payer: Medicare HMO

## 2022-12-14 ENCOUNTER — Emergency Department (HOSPITAL_COMMUNITY)
Admission: EM | Admit: 2022-12-14 | Discharge: 2022-12-14 | Disposition: A | Discharge status: Home / Self Care | Payer: Medicare HMO | Attending: Emergency Medicine | Admitting: Emergency Medicine

## 2022-12-14 DIAGNOSIS — R52 Pain, unspecified: Secondary | ICD-10-CM | POA: Diagnosis not present

## 2022-12-14 DIAGNOSIS — R1032 Left lower quadrant pain: Secondary | ICD-10-CM

## 2022-12-14 DIAGNOSIS — M25552 Pain in left hip: Secondary | ICD-10-CM | POA: Diagnosis not present

## 2022-12-14 LAB — CBC
HCT: 41.3 % (ref 36.0–46.0)
Hemoglobin: 13.2 g/dL (ref 12.0–15.0)
MCH: 30.4 pg (ref 26.0–34.0)
MCHC: 32 g/dL (ref 30.0–36.0)
MCV: 95.2 fL (ref 80.0–100.0)
Platelets: 197 10*3/uL (ref 150–400)
RBC: 4.34 MIL/uL (ref 3.87–5.11)
RDW: 14.8 % (ref 11.5–15.5)
WBC: 5.5 10*3/uL (ref 4.0–10.5)
nRBC: 0 % (ref 0.0–0.2)

## 2022-12-14 LAB — COMPREHENSIVE METABOLIC PANEL
ALT: 25 U/L (ref 0–44)
AST: 22 U/L (ref 15–41)
Albumin: 3.9 g/dL (ref 3.5–5.0)
Alkaline Phosphatase: 53 U/L (ref 38–126)
Anion gap: 11 (ref 5–15)
BUN: 18 mg/dL (ref 8–23)
CO2: 27 mmol/L (ref 22–32)
Calcium: 9.6 mg/dL (ref 8.9–10.3)
Chloride: 102 mmol/L (ref 98–111)
Creatinine, Ser: 0.72 mg/dL (ref 0.44–1.00)
GFR, Estimated: >60 mL/min (ref >60)
Glucose, Bld: 142 mg/dL — ABNORMAL HIGH (ref 70–99)
Potassium: 4.4 mmol/L (ref 3.5–5.1)
Sodium: 140 mmol/L (ref 135–145)
Total Bilirubin: 0.5 mg/dL (ref 0.3–1.2)
Total Protein: 6.7 g/dL (ref 6.5–8.1)

## 2022-12-14 LAB — MAGNESIUM: Magnesium: 2.1 mg/dL (ref 1.7–2.4)

## 2022-12-14 NOTE — ED Provider Notes (Signed)
[facility] EMERGENCY DEPARTMENT Provider Note   CSN: [id number] Arrival date & time: 12/13/22  2301     History  Chief Complaint  Patient presents with   Left Groin Pain     [name] is a 82 y.o. female.  82 year old female with prior medical history as detailed below presents for evaluation.  Patient reports acute onset of left groin and left medial thigh pain last night.  Symptoms began around 9 PM.  Symptoms lasted approximately 5 to 10 minutes.  Patient reports no pain now.  She denies any associated numbness or weakness in her lower extremities.  Patient is approximately 66-monthstatus post left knee replacement.  She denies prior history of DVT.  She reports that she does at least 3 sessions weekly of lower extremity rehab to increase strength and mobility of her left knee.  She reports that she had her last rehab session on Friday.  She reports that it is possible that she may have strained or injured a groin muscle.  The history is provided by the patient and medical records.       Home Medications Prior to Admission medications   Medication Sig Start Date End Date Taking? Authorizing Provider  acetaminophen (TYLENOL) 500 MG tablet Take 2 tablets (1,000 mg total) by mouth every 8 (eight) hours. 07/09/22   DIrving Copas PA-C  albuterol (VENTOLIN HFA) 108 (90 Base) MCG/ACT inhaler Inhale 2 puffs into the lungs every 6 (six) hours as needed for wheezing or shortness of breath. Patient not taking: Reported on 03/04/2022 03/01/22   ADamita Lack MD  amLODipine (NORVASC) 2.5 MG tablet Take 2.5 mg by mouth at bedtime.    [provider]  Ascorbic Acid (VITAMIN C PO) Take 500 mg by mouth every morning.    [provider]  atorvastatin (LIPITOR) 40 MG tablet Take 40 mg by mouth at bedtime.    [provider]  Biotin 10 MG TABS Take 10,000 mcg by mouth every morning.    [provider]  Calcium Carbonate Antacid  (CALCIUM CARBONATE PO) Take 600 mg by mouth every morning.    [provider]  Cholecalciferol (VITAMIN D) 50 MCG (2000 UT) tablet Take 2,000 Units by mouth daily.    [provider]  clonazePAM (KLONOPIN) 1 MG tablet Take 1 mg by mouth at bedtime. 07/16/22   [provider]  co-enzyme Q-10 30 MG capsule Take 30 mg by mouth daily.    [provider]  Cyanocobalamin (VITAMIN B-12 PO) Take 2,500 mcg by mouth every morning.    [provider]  docusate sodium (COLACE) 100 MG capsule Take 1 capsule (100 mg total) by mouth 2 (two) times daily. Patient taking differently: Take 100 mg by mouth daily. 07/09/22   DIrving Copas PA-C  gabapentin (NEURONTIN) 100 MG capsule Take 3 capsules (300 mg total) by mouth at bedtime. 09/12/22   JPieter Partridge DO  hydrocortisone (CORTEF) 5 MG tablet Take 1 tablet (5 mg total) by mouth as directed. 2 tabs with breakfast, and 1 tablet in the afternoon 08/04/22   Shamleffer, IMelanie Crazier MD  ibuprofen (ADVIL) 400 MG tablet Take 400 mg by mouth 3 (three) times daily.    [provider]  lamoTRIgine (LAMICTAL) 200 MG tablet Take 200 mg by mouth at bedtime. 04/06/16   [provider]  levothyroxine (SYNTHROID) 125 MCG tablet Take 1 tablet (125 mcg total) by mouth as directed. 1.5 tablet on Sundays,  1 tablet Monday through Saturday Patient taking differently: Take 125 mcg by mouth daily before breakfast. 10/11/21   Shamleffer, Melanie Crazier, MD  memantine (NAMENDA) 10 MG tablet Take 10 mg by mouth 2 (two) times daily. 06/21/20   [provider]  methocarbamol (ROBAXIN) 500 MG tablet Take 1 tablet (500 mg total) by mouth every 6 (six) hours as needed for muscle spasms (muscle pain). 07/09/22   Irving Copas, PA-C  metoprolol tartrate (LOPRESSOR) 25 MG tablet Take 25 mg by mouth 2 (two) times daily.    [provider]  Multiple Vitamin (MULTIVITAMIN WITH MINERALS) TABS tablet Take 1 tablet by mouth  every morning.    [provider]  OLANZapine (ZYPREXA) 15 MG tablet Take 30 mg by mouth at bedtime.    [provider]  Omega-3 Fatty Acids (FISH OIL) 1200 MG CAPS Take 1,200 mg by mouth every morning.    [provider]  omeprazole (PRILOSEC) 40 MG capsule Take 40 mg by mouth in the morning.    [provider]  oxyCODONE (OXY IR/ROXICODONE) 5 MG immediate release tablet Take 1 tablet (5 mg total) by mouth every 4 (four) hours as needed for severe pain. Patient not taking: Reported on 08/04/2022 07/09/22   Irving Copas, PA-C  polyethylene glycol (MIRALAX / GLYCOLAX) 17 g packet Take 17 g by mouth daily as needed for mild constipation. 07/09/22   Irving Copas, PA-C  polyvinyl alcohol (LIQUIFILM TEARS) 1.4 % ophthalmic solution Place 1 drop into both eyes as needed for dry eyes.    [provider]  ramelteon (ROZEREM) 8 MG tablet Take 8 mg by mouth at bedtime.    [provider]  rosuvastatin (CRESTOR) 20 MG tablet Take 1 tablet (20 mg total) by mouth daily. Need labs from PCP 05/30/22   Skeet Latch, MD  spironolactone (ALDACTONE) 25 MG tablet TAKE 1 TABLET (25 MG TOTAL) BY MOUTH DAILY. 03/13/22   Skeet Latch, MD  traMADol (ULTRAM) 50 MG tablet Take 50-100 mg by mouth every 6 (six) hours as needed. 07/30/22   [provider]  traZODone (DESYREL) 50 MG tablet Take 150 mg by mouth at bedtime.    [provider]      Allergies    Lithium, Fluoxetine, Macrodantin, Mirabegron, Nitrofurantoin, Paxil [paroxetine], Prednisone, Prozac [fluoxetine hcl], Sertraline hcl, Solifenacin, Sulfa antibiotics, and Wellbutrin [bupropion hcl]    Review of Systems   Review of Systems  All other systems reviewed and are negative.   Physical Exam Updated Vital Signs BP (!) 137/51 (BP Location: Right Arm)   Pulse 88   Temp 98.2 F (36.8 C) (Oral)   Resp 16   SpO2 94%  Physical Exam Vitals and nursing note reviewed.   Constitutional:      General: She is not in acute distress.    Appearance: Normal appearance. She is well-developed.  HENT:     Head: Normocephalic and atraumatic.  Eyes:     Conjunctiva/sclera: Conjunctivae normal.     Pupils: Pupils are equal, round, and reactive to light.  Cardiovascular:     Rate and Rhythm: Normal rate and regular rhythm.     Heart sounds: Normal heart sounds.  Pulmonary:     Effort: Pulmonary effort is normal. No respiratory distress.     Breath sounds: Normal breath sounds.  Abdominal:     General: There is no distension.     Palpations: Abdomen is soft.     Tenderness: There is no abdominal  tenderness.  Musculoskeletal:        General: No deformity. Normal range of motion.     Cervical back: Normal range of motion and neck supple.     Comments: No appreciable tenderness or mass or abnormality found in the left groin or left medial thigh with exam.  No overlying erythema or rash noted.  Distal left lower extremity is neurovascular intact.  Skin:    General: Skin is warm and dry.  Neurological:     General: No focal deficit present.     Mental Status: She is alert and oriented to person, place, and time.     ED Results / Procedures / Treatments   Labs (all labs ordered are listed, but only abnormal results are displayed) Labs Reviewed  COMPREHENSIVE METABOLIC PANEL - Abnormal; Notable for the following components:      Result Value   Glucose, Bld 142 (*)    All other components within normal limits  CBC  MAGNESIUM    EKG None  Radiology DG Hip Unilat W or Wo Pelvis 2-3 Views Left  Result Date: 12/14/2022 CLINICAL DATA:  Left hip pain EXAM: DG HIP (WITH OR WITHOUT PELVIS) 2-3V LEFT COMPARISON:  None Available. FINDINGS: There is no evidence of hip fracture or dislocation. There is no evidence of arthropathy or other focal bone abnormality. IMPRESSION: Negative. Electronically Signed   By: Fidela Salisbury M.D.   On: 12/14/2022 00:40     Procedures Procedures    Medications Ordered in ED Medications - No data to display  ED Course/ Medical Decision Making/ A&P                           Medical Decision Making   Medical Screen Complete  This patient presented to the ED with complaint of transient left groin pain.  This complaint involves an extensive number of treatment options. The initial differential diagnosis includes, but is not limited to, muscular strain, muscular spasm, metabolic abnormality, DVT, etc.  This presentation is: Acute, Self-Limited, Previously Undiagnosed, Uncertain Prognosis, Complicated, Systemic Symptoms, and Threat to Life/Bodily Function  Patient presents after experiencing transient left groin pain.  Patient's symptoms lasted approximately at most 10 minutes last night.  She has been without symptoms since.  Screening labs and ultrasound obtained are without significant abnormality.  Described symptoms and exam are most consistent with likely muscular spasm.  Patient is comfortable with plan for discharge home.  She does understand need for close outpatient follow-up.  Strict return precautions given and understood. Patient reports that she may have strained her groin while doing her " vigorous exercise" early this week.  She is advised to maintain her exercise but to moderate it so that she doesn't ijure herself.  Additional history obtained: External records from outside sources obtained and reviewed including prior ED visits and prior Inpatient records.    Lab Tests:  I ordered and personally interpreted labs.  The pertinent results include: CBC, CMP, magnesium   Imaging Studies ordered:  I ordered imaging studies including plain films of left hip, ultrasound of left leg I independently visualized and interpreted obtained imaging which showed no acute pathology I agree with the radiologist interpretation.   Cardiac Monitoring:  The patient was maintained on a  cardiac monitor.  I personally viewed and interpreted the cardiac monitor which showed an underlying rhythm of: NSR  Problem List / ED Course:  Left groin strain   Reevaluation:  After the  interventions noted above, I reevaluated the patient and found that they have: improved  Disposition:  After consideration of the diagnostic results and the patients response to treatment, I feel that the patent would benefit from close outpatient follow-up.          Final Clinical Impression(s) / ED Diagnoses Final diagnoses:  Left inguinal pain    Rx / DC Orders ED Discharge Orders     None         Valarie Merino, MD 12/14/22 1045

## 2022-12-14 NOTE — ED Provider Triage Note (Signed)
Emergency Medicine Provider Triage Evaluation Note  [name] , a 82 y.o. female  was evaluated in triage.  Pt complains of L groin pain that began at 9pm and has been severe and lasted 5 or so minutes. Has improved since. States she does feel the leg feels "funny" maybe somewhat less sensitive.   Not hurting now. No falls.   Review of Systems  Positive: L groin pain Negative: Fever   Physical Exam  There were no vitals taken for this visit. Gen:   Awake, no distress   Resp:  Normal effort  MSK:   Moves extremities without difficulty  Other:  Sensation intact in BL legs/feet. Pulses nml.   Medical Decision Making  Medically screening exam initiated at 12:04 AM.  Appropriate orders placed.  [name] was informed that the remainder of the evaluation will be completed by another provider, this initial triage assessment does not replace that evaluation, and the importance of remaining in the ED until their evaluation is complete.  Labs, xray. Sx not consistent w DVT and pulse presence makes acute thrombus arterial occlusion less likely.    [name], [place] 12/14/22 0010

## 2022-12-14 NOTE — Discharge Instructions (Addendum)
Return for any problem.  ?

## 2022-12-14 NOTE — Progress Notes (Signed)
VASCULAR LAB    Left lower extremity venous duplex has been performed.  See CV proc for preliminary results.   Jaymian Bogart, RVT 12/14/2022, 10:27 AM

## 2022-12-14 NOTE — ED Notes (Signed)
[facility] called for pt 

## 2022-12-14 NOTE — ED Notes (Signed)
DC instructions reviewed with pt. PT verbalized understanding. PT DC °

## 2022-12-25 DIAGNOSIS — C50911 Malignant neoplasm of unspecified site of right female breast: Secondary | ICD-10-CM | POA: Diagnosis not present

## 2022-12-30 ENCOUNTER — Inpatient Hospital Stay: Payer: Medicare HMO | Attending: Hematology and Oncology | Admitting: Hematology and Oncology

## 2022-12-30 ENCOUNTER — Other Ambulatory Visit: Payer: Self-pay

## 2022-12-30 VITALS — BP 144/51 | HR 92 | Temp 97.9°F | Resp 18 | Ht 62.0 in | Wt 193.4 lb

## 2022-12-30 DIAGNOSIS — Z801 Family history of malignant neoplasm of trachea, bronchus and lung: Secondary | ICD-10-CM | POA: Insufficient documentation

## 2022-12-30 DIAGNOSIS — Z170 Estrogen receptor positive status [ER+]: Secondary | ICD-10-CM | POA: Diagnosis not present

## 2022-12-30 DIAGNOSIS — Z87891 Personal history of nicotine dependence: Secondary | ICD-10-CM | POA: Diagnosis not present

## 2022-12-30 DIAGNOSIS — D0512 Intraductal carcinoma in situ of left breast: Secondary | ICD-10-CM

## 2022-12-30 DIAGNOSIS — C50811 Malignant neoplasm of overlapping sites of right female breast: Secondary | ICD-10-CM | POA: Diagnosis not present

## 2022-12-30 DIAGNOSIS — I10 Essential (primary) hypertension: Secondary | ICD-10-CM | POA: Diagnosis not present

## 2022-12-30 NOTE — Progress Notes (Signed)
[facility]  [phone]) [phone] [phone]    ID: [name] DOB: 15-Feb-1941  MR#: [id number]  OZH#:[id number]  Patient Care Team: Charlane Ferretti, MD as PCP - General (Internal Medicine) Pieter Partridge, DO as Consulting Physician (Neurology) Delsa Bern, MD as Consulting Physician (Obstetrics and Gynecology) Magrinat, Virgie Dad, MD (Inactive) as Consulting Physician (Oncology) Skeet Latch, MD as Attending Physician (Cardiology) Wonda Horner, MD as Consulting Physician (Gastroenterology) Chucky May, MD as Consulting Physician (Psychiatry) Latanya Maudlin, MD as Consulting Physician (Orthopedic Surgery) Rolm Bookbinder, MD as Consulting Physician (Dermatology) Veatrice Bourbon, RT (Inactive) as Technician (Radiology) Benay Pike, MD OTHER MD:  CHIEF COMPLAINT: noninvasive breast cancer, estrogen receptor positive  CURRENT TREATMENT: Observation  INTERVAL HISTORY:  Shambhavi was contacted today for follow up of her noninvasive breast cancer.  She opted for observation alone (no surgery, radiation or anti-estrogens). Since her last visit, she underwent left diagnostic mammography in Nov 2023, stable appearance of biopsy-proven DCIS calcifications in the left breast upper outer quadrant. Repeat mammogram due in May 2024. She is still healing from knee pain, s/p knee replacement. Rest of the pertinent 10 point ROS reviewed and negative    COVID 19 VACCINATION STATUS: Moderna x2 with booster in July.   RIGHT BREAST CANCER HISTORY: From the original intake note:   Ms. Basley had routine screening mammography at the [facility] on September 06, 2009. There was a possible abnormality in the upper outer quadrant of the right breast so she was brought back for diagnostic studies September 12, 2009.  Magnification views confirmed a 1.4 spiculated mass in the upper outer quadrant of the right breast with a few microcalcifications associated with  it.  By ultrasound, this measured 1.5 cm and was highly suggestive of malignancy.  Biopsy was performed the same day and showed ([id number] and [id number]) an invasive lobular carcinoma which was 92% ER and 44% PR positive.  The proliferation marker was 13%.  The tumor did not overexpress Her-2 with a ratio of 1.31.     With this information, the patient was referred to Dr. Brantley Stage and after appropriate discussion, she underwent definitive right lumpectomy and axillary sentinel lymph node sampling October 17, 2009.  The final pathology there ([id number]) showed a 1.5 cm invasive lobular carcinoma, grade 1, with negative though closed margins (the in situ component was at 1 mm from the superior, inferior and medial margins) with no evidence of lymphovascular invasion and the single sentinel lymph node clear.  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     HISTORY OF CURRENT ILLNESS: Chamara Dyck Brandi has a history of right breast invasive lobular carcinoma, for which she underwent lumpectomy and radiation in 10/2009.  She did not receive chemo and she did not tolerate antiestrogens.  I followed the patient, and she was subsequently released from follow up in 05/2014. See full history below.  More recently, she had routine screening mammography on 11/29/2020 showing a possible abnormality in the left breast. She underwent left diagnostic mammography with tomography at [facility] on 12/06/2020 showing: breast density category B; 1 cm grouped fine punctate calcifications in upper-outer left breast.  Accordingly on 12/20/2020 she proceeded to biopsy of the left breast area in question.  Post procedure mammography showed the clip at the targeted area.  The pathology from this procedure ([id number]) showed: ductal carcinoma in situ, low grade, with calcifications. Prognostic indicators significant for: estrogen receptor, >95% positive with strong staining intensity and progesterone receptor, 90%  positive with moderate-strong  staining intensity.   Cancer Staging  Ductal carcinoma in situ (DCIS) of left breast Staging form: Breast, AJCC 8th Edition - Clinical: Stage 0 (cTis (DCIS), cN0, cM0, ER+, PR+) - Signed by Chauncey Cruel, MD on 01/01/2021 Nuclear grade: G1  Malignant neoplasm of overlapping sites of right breast in female, estrogen receptor positive ([place]) Staging form: Breast, AJCC 8th Edition - Clinical: No Stage Recommended (ycT1c, cN0, cM0, G1, ER+, PR+, HER2-) - Signed by Chauncey Cruel, MD on 01/01/2021 Stage prefix: Post-therapy    The patient's subsequent history is as detailed below.   PAST MEDICAL HISTORY: Past Medical History:  Diagnosis Date   Adrenal insufficiency ([place]) 07/25/2021   Anxiety    Arrhythmia    right bundle branch block   Bipolar 1 disorder (HCC)    Cancer ([place])    right breast cancer   Depression    Dyspnea    with exertion   GERD (gastroesophageal reflux disease)    Headache    Heart murmur    Hyperlipidemia    Hypertension    Hypothyroidism    Morbid obesity (HCC)    Osteoarthritis    Pneumonia    PONV (postoperative nausea and vomiting)    Schizo-affective schizophrenia ([place])    Thyroid disease    hypothyroidism  History of: dilated cardiomyopathy, bundle branch block, heart murmur, rosacea, psychiatric hospitalization in 1999 for bipolar disorder, chronic schizophrenia, and early Alzheimer's disease   PAST SURGICAL HISTORY: Past Surgical History:  Procedure Laterality Date   BREAST SURGERY     CHOLECYSTECTOMY     DILATION AND CURETTAGE OF UTERUS     ESOPHAGEAL MANOMETRY N/A 01/29/2018   Procedure: ESOPHAGEAL MANOMETRY (EM);  Surgeon: Wonda Horner, MD;  Location: [facility] ENDOSCOPY;  Service: Endoscopy;  Laterality: N/A;   HAND SURGERY     Right-trigger finger   IR THORACENTESIS ASP PLEURAL SPACE W/IMG GUIDE  06/28/2018   IR THORACENTESIS ASP PLEURAL SPACE W/IMG GUIDE  07/19/2018   KNEE SURGERY     Left   TONSILLECTOMY     TOTAL KNEE  ARTHROPLASTY Left 07/08/2022   Procedure: TOTAL KNEE ARTHROPLASTY;  Surgeon: Paralee Cancel, MD;  Location: [facility] ORS;  Service: Orthopedics;  Laterality: Left;    FAMILY HISTORY: Family History  Problem Relation Age of Onset   Hypertension Mother    Lung cancer Brother    Schizophrenia Brother    The patient's mother died from pneumonia at the age of 22 in the setting of Parkinson's disease.  The patient's father died at the age of 50.  The patient's only sibling was a brother who died from lung cancer in his fifties.    She is not aware of any breast ovarian or prostate cancer in the family   GYNECOLOGIC HISTORY:  No LMP recorded. Patient is postmenopausal. Menarche: 82 years old [place] P 0 LMP ~age 27 Contraceptive HRT used for about 2 years  Hysterectomy? no BSO? 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  She is now retired.  She has lived in [place] about 30 years.  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  She is divorced. She lives in [place].  She attends [employer]    ADVANCED DIRECTIVES: The patient has named Verne Grain  as her healthcare power of attorney   HEALTH MAINTENANCE: Social History   Tobacco Use   Smoking status: Former    Types: Cigarettes    Quit date: 12/08/1978    Years since quitting: 44.0   Smokeless tobacco: Never  Vaping Use   Vaping Use: Never used  Substance Use Topics   Alcohol use: Never   Drug use: No      Allergies  Allergen Reactions   Lithium Nausea Only   Fluoxetine Other (See Comments)    Pt felt crazy   Macrodantin Other (See Comments)    Unknown reaction   Mirabegron Other (See Comments)    ineffective   Nitrofurantoin Other (See Comments)    Other reaction(s): Did not agree with me   Paxil [Paroxetine] Other  (See Comments)    Made pt feel crazy    Prednisone Other (See Comments)    Makes her feel very jittery   Prozac [Fluoxetine Hcl] Other (See Comments)    Pt felt crazy    Sertraline Hcl Other (See Comments)    Unknown reaction   Solifenacin Other (See Comments)    Ineffective    Sulfa Antibiotics Other (See Comments)    Unknown reaction   Wellbutrin [Bupropion Hcl] Other (See Comments)    Unknown reaction    Current Outpatient Medications  Medication Sig Dispense Refill   acetaminophen (TYLENOL) 500 MG tablet Take 2 tablets (1,000 mg total) by mouth every 8 (eight) hours. 30 tablet 0   albuterol (VENTOLIN HFA) 108 (90 Base) MCG/ACT inhaler Inhale 2 puffs into the lungs every 6 (six) hours as needed for wheezing or shortness of breath. (Patient not taking: Reported on 03/04/2022) 8 g 2   amLODipine (NORVASC) 2.5 MG tablet Take 2.5 mg by mouth at bedtime.     Ascorbic Acid (VITAMIN C PO) Take 500 mg by mouth every morning.     atorvastatin (LIPITOR) 40 MG tablet Take 40 mg by mouth at bedtime.     Biotin 10 MG TABS Take 10,000 mcg by mouth every morning.     Calcium Carbonate Antacid (CALCIUM CARBONATE PO) Take 600 mg by mouth every morning.     Cholecalciferol (VITAMIN D) 50 MCG (2000 UT) tablet Take 2,000 Units by mouth daily.     clonazePAM (KLONOPIN) 1 MG tablet Take 1 mg by mouth at bedtime.     co-enzyme Q-10 30 MG capsule Take 30 mg by mouth daily.     Cyanocobalamin (VITAMIN B-12 PO) Take 2,500 mcg by mouth every morning.     docusate sodium (COLACE) 100 MG capsule Take 1 capsule (100 mg total) by mouth 2 (two) times daily. (Patient taking differently: Take 100 mg by mouth daily.) 10 capsule 0   gabapentin (NEURONTIN) 100 MG capsule Take 3 capsules (300 mg total) by mouth at bedtime. 90 capsule 5   hydrocortisone (CORTEF) 5 MG tablet Take 1 tablet (5 mg total) by mouth as directed. 2 tabs with breakfast, and 1 tablet in the afternoon 300 tablet 3   ibuprofen (ADVIL) 400 MG  tablet Take 400 mg by mouth 3 (three) times daily.     lamoTRIgine (LAMICTAL) 200 MG tablet Take 200 mg by mouth at bedtime.  12   levothyroxine (SYNTHROID) 125 MCG tablet Take 1 tablet (125 mcg total) by mouth as directed. 1.5 tablet on Sundays, 1 tablet Monday through Saturday (Patient taking differently: Take 125 mcg by mouth daily before breakfast.) 98 tablet 2   memantine (NAMENDA) 10 MG  tablet Take 10 mg by mouth 2 (two) times daily.     methocarbamol (ROBAXIN) 500 MG tablet Take 1 tablet (500 mg total) by mouth every 6 (six) hours as needed for muscle spasms (muscle pain). 40 tablet 0   metoprolol tartrate (LOPRESSOR) 25 MG tablet Take 25 mg by mouth 2 (two) times daily.     Multiple Vitamin (MULTIVITAMIN WITH MINERALS) TABS tablet Take 1 tablet by mouth every morning.     OLANZapine (ZYPREXA) 15 MG tablet Take 30 mg by mouth at bedtime.     Omega-3 Fatty Acids (FISH OIL) 1200 MG CAPS Take 1,200 mg by mouth every morning.     omeprazole (PRILOSEC) 40 MG capsule Take 40 mg by mouth in the morning.     oxyCODONE (OXY IR/ROXICODONE) 5 MG immediate release tablet Take 1 tablet (5 mg total) by mouth every 4 (four) hours as needed for severe pain. (Patient not taking: Reported on 08/04/2022) 42 tablet 0   polyethylene glycol (MIRALAX / GLYCOLAX) 17 g packet Take 17 g by mouth daily as needed for mild constipation. 14 each 0   polyvinyl alcohol (LIQUIFILM TEARS) 1.4 % ophthalmic solution Place 1 drop into both eyes as needed for dry eyes.     ramelteon (ROZEREM) 8 MG tablet Take 8 mg by mouth at bedtime.     rosuvastatin (CRESTOR) 20 MG tablet Take 1 tablet (20 mg total) by mouth daily. Need labs from PCP 90 tablet 1   spironolactone (ALDACTONE) 25 MG tablet TAKE 1 TABLET (25 MG TOTAL) BY MOUTH DAILY. 90 tablet 0   traMADol (ULTRAM) 50 MG tablet Take 50-100 mg by mouth every 6 (six) hours as needed.     traZODone (DESYREL) 50 MG tablet Take 150 mg by mouth at bedtime.     No current  facility-administered medications for this visit.    OBJECTIVE:   There were no vitals filed for this visit.    There is no height or weight on file to calculate BMI.   Wt Readings from Last 3 Encounters:  09/12/22 170 lb (77.1 kg)  08/04/22 174 lb 6.4 oz (79.1 kg)  07/11/22 186 lb 1.1 oz (84.4 kg)     ECOG FS:2 - Symptomatic, <50% confined to bed  Physical Exam Constitutional:      Appearance: Normal appearance.  Cardiovascular:     Rate and Rhythm: Normal rate and regular rhythm.  Chest:     Comments: Bilateral breasts exam deferred. No ccervical or axillary LN Musculoskeletal:     Cervical back: Normal range of motion.  Lymphadenopathy:     Cervical: No cervical adenopathy.  Neurological:     Mental Status: She is alert.    Chronic venous stasis BLE.  LAB RESULTS:  CMP     Component Value Date/Time   NA 140 12/14/2022 0017   NA 142 05/11/2014 0911   K 4.4 12/14/2022 0017   K 4.1 05/11/2014 0911   CL 102 12/14/2022 0017   CL 105 05/12/2013 1452   CO2 27 12/14/2022 0017   CO2 19 (L) 05/11/2014 0911   GLUCOSE 142 (H) 12/14/2022 0017   GLUCOSE 106 05/11/2014 0911   GLUCOSE 100 (H) 05/12/2013 1452   BUN 18 12/14/2022 0017   BUN 14.9 05/11/2014 0911   CREATININE 0.72 12/14/2022 0017   CREATININE 0.79 01/01/2021 1508   CREATININE 0.89 10/10/2015 0933   CREATININE 0.9 05/11/2014 0911   CALCIUM 9.6 12/14/2022 0017   CALCIUM 9.9 05/11/2014 0911   PROT  6.7 12/14/2022 0017   PROT 6.8 05/11/2014 0911   ALBUMIN 3.9 12/14/2022 0017   ALBUMIN 3.9 05/11/2014 0911   AST 22 12/14/2022 0017   AST 18 01/01/2021 1508   AST 37 (H) 05/11/2014 0911   ALT 25 12/14/2022 0017   ALT 33 01/01/2021 1508   ALT 40 05/11/2014 0911   ALKPHOS 53 12/14/2022 0017   ALKPHOS 52 05/11/2014 0911   BILITOT 0.5 12/14/2022 0017   BILITOT 0.3 01/01/2021 1508   BILITOT 0.33 05/11/2014 0911   GFRNONAA >60 12/14/2022 0017   GFRNONAA >60 01/01/2021 1508   GFRAA >60 08/20/2020 0333    No  results found for: "TOTALPROTELP", "ALBUMINELP", "A1GS", "A2GS", "BETS", "BETA2SER", "GAMS", "MSPIKE", "SPEI"  Lab Results  Component Value Date   WBC 5.5 12/14/2022   NEUTROABS 5.3 07/11/2022   HGB 13.2 12/14/2022   HCT 41.3 12/14/2022   MCV 95.2 12/14/2022   PLT 197 12/14/2022    Lab Results  Component Value Date   LABCA2 12 04/01/2012    No components found for: "QMVHQI696"  No results for input(s): "INR" in the last 168 hours.  Lab Results  Component Value Date   LABCA2 12 04/01/2012    No results found for: "CAN199"  No results found for: "CAN125"  No results found for: "CAN153"  No results found for: "CA2729"  No components found for: "HGQUANT"  No results found for: "CEA1", "CEA" / No results found for: "CEA1", "CEA"   No results found for: "AFPTUMOR"  No results found for: "CHROMOGRNA"  No results found for: "KPAFRELGTCHN", "LAMBDASER", "KAPLAMBRATIO" (kappa/lambda light chains)  No results found for: "HGBA", "HGBA2QUANT", "HGBFQUANT", "HGBSQUAN" (Hemoglobinopathy evaluation)   Lab Results  Component Value Date   LDH 207 (H) 08/17/2020    No results found for: "IRON", "TIBC", "IRONPCTSAT" (Iron and TIBC)  Lab Results  Component Value Date   FERRITIN 97 08/17/2020    Urinalysis    Component Value Date/Time   COLORURINE STRAW (A) 03/03/2022 1310   APPEARANCEUR CLEAR 03/03/2022 1310   LABSPEC 1.006 03/03/2022 1310   PHURINE 8.0 03/03/2022 1310   GLUCOSEU NEGATIVE 03/03/2022 1310   HGBUR NEGATIVE 03/03/2022 1310   BILIRUBINUR NEGATIVE 03/03/2022 1310   KETONESUR NEGATIVE 03/03/2022 1310   PROTEINUR NEGATIVE 03/03/2022 1310   NITRITE NEGATIVE 03/03/2022 1310   LEUKOCYTESUR NEGATIVE 03/03/2022 1310    STUDIES: VAS [place] LOWER EXTREMITY VENOUS (DVT) (7a-7p)  Result Date: 12/14/2022  Lower Venous DVT Study Patient Name:  CILICIA BORDEN  Date of Exam:   12/14/2022 Medical Rec #: [id number]      Accession #:    [id number] Date of Birth: November 14, 1941        Patient Gender: F Patient Age:   5 years Exam Location:  [facility] Procedure:      VAS [place] LOWER EXTREMITY VENOUS (DVT) Referring Phys: Collier Salina MESSICK --------------------------------------------------------------------------------  Indications: Pain. Other Indications: Neuropathy. Total knee replacement 07/08/22. Comparison Study: Prior negative bilateral LEV done 06/19/18 Performing Technologist: Sharion Dove RVS  Examination Guidelines: A complete evaluation includes B-mode imaging, spectral Doppler, color Doppler, and power Doppler as needed of all accessible portions of each vessel. Bilateral testing is considered an integral part of a complete examination. Limited examinations for reoccurring indications may be performed as noted. The reflux portion of the exam is performed with the patient in reverse Trendelenburg.  +-----+---------------+---------+-----------+--------------+--------------+ RIGHTCompressibilityPhasicitySpontaneityProperties    Thrombus Aging +-----+---------------+---------+-----------+--------------+--------------+ CFV  Full  pulsatile flow               +-----+---------------+---------+-----------+--------------+--------------+   +---------+---------------+---------+-----------+--------------+--------------+ LEFT     CompressibilityPhasicitySpontaneityProperties    Thrombus Aging +---------+---------------+---------+-----------+--------------+--------------+ CFV      Full                               pulsatile flow               +---------+---------------+---------+-----------+--------------+--------------+ SFJ      Full                                                            +---------+---------------+---------+-----------+--------------+--------------+ FV Prox  Full                                                            +---------+---------------+---------+-----------+--------------+--------------+  FV Mid   Full                                                            +---------+---------------+---------+-----------+--------------+--------------+ FV DistalFull                                                            +---------+---------------+---------+-----------+--------------+--------------+ PFV      Full                                                            +---------+---------------+---------+-----------+--------------+--------------+ POP      Full                               pulsatile flow               +---------+---------------+---------+-----------+--------------+--------------+ PTV      Full                                                            +---------+---------------+---------+-----------+--------------+--------------+ PERO     Full                                                            +---------+---------------+---------+-----------+--------------+--------------+  Summary: RIGHT: - No evidence of common femoral vein obstruction.  LEFT: - There is no evidence of deep vein thrombosis in the lower extremity.  Pulsatile waveforms.  *See table(s) above for measurements and observations. Electronically signed by Orlie Pollen on 12/14/2022 at 3:01:40 PM.    Final    DG Hip Unilat W or Wo Pelvis 2-3 Views Left  Result Date: 12/14/2022 CLINICAL DATA:  Left hip pain EXAM: DG HIP (WITH OR WITHOUT PELVIS) 2-3V LEFT COMPARISON:  None Available. FINDINGS: There is no evidence of hip fracture or dislocation. There is no evidence of arthropathy or other focal bone abnormality. IMPRESSION: Negative. Electronically Signed   By: Fidela Salisbury M.D.   On: 12/14/2022 00:40     ELIGIBLE FOR AVAILABLE RESEARCH PROTOCOL: no  ASSESSMENT: 82 y.o. [place] woman with  RIGHT BREAST CANCER:  (1) status post right lumpectomy and sentinel lymph node sampling in November 2010 for a T1cN0, stage IA invasive lobular carcinoma, grade 1, strongly  estrogen and progesterone receptor positive, HER-2/neu negative, with low MIB-1.     (2) Status post ADJUVANT radiation therapy, completed in February 2011.   (3) Did not tolerate aromatase anastrozole or letrozole and WAs not felt to be a candidate for tamoxifen--opted for observation alone and was discharged from follow-up 2015  LEFT BREAST CANCER (4) status post left breast biopsy 12/20/2020 for a clinically 1 cm ductal carcinoma in situ, grade 1, strongly estrogen and progesterone receptor positive  (5) refused standard of care lumpectomy, opted for observation alone  (6) she will have bi-annual mammograms in May and November indefinitely   PLAN:  Most recent mammogram from November 2023 with stable appearance of biopsy-proven DCIS calcifications in the left breast upper outer quadrant. No concerns on physical examination today, no lymphadenopathy noted. Repeat mammogram in 6 months due May 2024, ordered and will be faxed to [facility]. She would like to RTC in one yr in person, will do telephone visit in May Encouraged regular walks.  Total time spent: 30 minutes.  *Total Encounter Time as defined by the Centers for Medicare and Medicaid Services includes, in addition to the face-to-face time of a patient visit (documented in the note above) non-face-to-face time: obtaining and reviewing outside history, ordering and reviewing medications, tests or procedures, care coordination (communications with other health care professionals or caregivers) and documentation in the medical record.

## 2022-12-31 ENCOUNTER — Ambulatory Visit: Payer: Medicare HMO | Admitting: Hematology and Oncology

## 2023-01-02 ENCOUNTER — Emergency Department (HOSPITAL_COMMUNITY)
Admission: EM | Admit: 2023-01-02 | Discharge: 2023-01-03 | Disposition: A | Discharge status: Skilled Nursing Facility | Payer: Medicare HMO | Attending: Emergency Medicine | Admitting: Emergency Medicine

## 2023-01-02 ENCOUNTER — Emergency Department (HOSPITAL_COMMUNITY): Payer: Medicare HMO

## 2023-01-02 ENCOUNTER — Encounter (HOSPITAL_COMMUNITY): Payer: Self-pay

## 2023-01-02 ENCOUNTER — Other Ambulatory Visit: Payer: Self-pay

## 2023-01-02 DIAGNOSIS — E039 Hypothyroidism, unspecified: Secondary | ICD-10-CM | POA: Diagnosis not present

## 2023-01-02 DIAGNOSIS — Z0389 Encounter for observation for other suspected diseases and conditions ruled out: Secondary | ICD-10-CM | POA: Diagnosis not present

## 2023-01-02 DIAGNOSIS — R600 Localized edema: Secondary | ICD-10-CM | POA: Insufficient documentation

## 2023-01-02 DIAGNOSIS — R609 Edema, unspecified: Secondary | ICD-10-CM | POA: Diagnosis not present

## 2023-01-02 DIAGNOSIS — Z79899 Other long term (current) drug therapy: Secondary | ICD-10-CM | POA: Insufficient documentation

## 2023-01-02 DIAGNOSIS — R0602 Shortness of breath: Secondary | ICD-10-CM | POA: Diagnosis not present

## 2023-01-02 DIAGNOSIS — Z20822 Contact with and (suspected) exposure to covid-19: Secondary | ICD-10-CM | POA: Insufficient documentation

## 2023-01-02 DIAGNOSIS — R7309 Other abnormal glucose: Secondary | ICD-10-CM | POA: Insufficient documentation

## 2023-01-02 DIAGNOSIS — I2510 Atherosclerotic heart disease of native coronary artery without angina pectoris: Secondary | ICD-10-CM | POA: Diagnosis not present

## 2023-01-02 DIAGNOSIS — J9811 Atelectasis: Secondary | ICD-10-CM | POA: Diagnosis not present

## 2023-01-02 DIAGNOSIS — Z853 Personal history of malignant neoplasm of breast: Secondary | ICD-10-CM | POA: Diagnosis not present

## 2023-01-02 DIAGNOSIS — I700 Atherosclerosis of aorta: Secondary | ICD-10-CM | POA: Diagnosis not present

## 2023-01-02 DIAGNOSIS — I10 Essential (primary) hypertension: Secondary | ICD-10-CM | POA: Insufficient documentation

## 2023-01-02 DIAGNOSIS — R0600 Dyspnea, unspecified: Secondary | ICD-10-CM

## 2023-01-02 LAB — RESP PANEL BY RT-PCR (RSV, FLU A&B, COVID)  RVPGX2
Influenza A by PCR: NEGATIVE
Influenza B by PCR: NEGATIVE
Resp Syncytial Virus by PCR: NEGATIVE
SARS Coronavirus 2 by RT PCR: NEGATIVE

## 2023-01-02 LAB — I-STAT VENOUS BLOOD GAS, ED
Acid-Base Excess: 0 mmol/L (ref 0.0–2.0)
Bicarbonate: 23.1 mmol/L (ref 20.0–28.0)
Calcium, Ion: 1.1 mmol/L — ABNORMAL LOW (ref 1.15–1.40)
HCT: 37 % (ref 36.0–46.0)
Hemoglobin: 12.6 g/dL (ref 12.0–15.0)
O2 Saturation: 99 %
Potassium: 4.3 mmol/L (ref 3.5–5.1)
Sodium: 139 mmol/L (ref 135–145)
TCO2: 24 mmol/L (ref 22–32)
pCO2, Ven: 32.2 mmHg — ABNORMAL LOW (ref 44–60)
pH, Ven: 7.465 — ABNORMAL HIGH (ref 7.25–7.43)
pO2, Ven: 123 mmHg — ABNORMAL HIGH (ref 32–45)

## 2023-01-02 LAB — CBC WITH DIFFERENTIAL/PLATELET
Abs Immature Granulocytes: 0.05 10*3/uL (ref 0.00–0.07)
Basophils Absolute: 0 10*3/uL (ref 0.0–0.1)
Basophils Relative: 1 %
Eosinophils Absolute: 0 10*3/uL (ref 0.0–0.5)
Eosinophils Relative: 1 %
HCT: 39.4 % (ref 36.0–46.0)
Hemoglobin: 13.2 g/dL (ref 12.0–15.0)
Immature Granulocytes: 1 %
Lymphocytes Relative: 24 %
Lymphs Abs: 1.4 10*3/uL (ref 0.7–4.0)
MCH: 31.6 pg (ref 26.0–34.0)
MCHC: 33.5 g/dL (ref 30.0–36.0)
MCV: 94.3 fL (ref 80.0–100.0)
Monocytes Absolute: 0.7 10*3/uL (ref 0.1–1.0)
Monocytes Relative: 12 %
Neutro Abs: 3.5 10*3/uL (ref 1.7–7.7)
Neutrophils Relative %: 61 %
Platelets: 204 10*3/uL (ref 150–400)
RBC: 4.18 MIL/uL (ref 3.87–5.11)
RDW: 14.6 % (ref 11.5–15.5)
WBC: 5.8 10*3/uL (ref 4.0–10.5)
nRBC: 0 % (ref 0.0–0.2)

## 2023-01-02 LAB — LACTIC ACID, PLASMA
Lactic Acid, Venous: 2 mmol/L (ref 0.5–1.9)
Lactic Acid, Venous: 1.6 mmol/L (ref 0.5–1.9)

## 2023-01-02 LAB — COMPREHENSIVE METABOLIC PANEL
ALT: 25 U/L (ref 0–44)
AST: 31 U/L (ref 15–41)
Albumin: 3.9 g/dL (ref 3.5–5.0)
Alkaline Phosphatase: 54 U/L (ref 38–126)
Anion gap: 12 (ref 5–15)
BUN: 22 mg/dL (ref 8–23)
CO2: 23 mmol/L (ref 22–32)
Calcium: 9.5 mg/dL (ref 8.9–10.3)
Chloride: 102 mmol/L (ref 98–111)
Creatinine, Ser: 0.88 mg/dL (ref 0.44–1.00)
GFR, Estimated: >60 mL/min (ref >60)
Glucose, Bld: 121 mg/dL — ABNORMAL HIGH (ref 70–99)
Potassium: 4.5 mmol/L (ref 3.5–5.1)
Sodium: 137 mmol/L (ref 135–145)
Total Bilirubin: 0.5 mg/dL (ref 0.3–1.2)
Total Protein: 6.8 g/dL (ref 6.5–8.1)

## 2023-01-02 LAB — TROPONIN I (HIGH SENSITIVITY)
Troponin I (High Sensitivity): 62 ng/L — ABNORMAL HIGH (ref <18)
Troponin I (High Sensitivity): 60 ng/L — ABNORMAL HIGH (ref <18)

## 2023-01-02 LAB — BRAIN NATRIURETIC PEPTIDE: B Natriuretic Peptide: 72.8 pg/mL (ref 0.0–100.0)

## 2023-01-02 LAB — TSH: TSH: 5.118 u[IU]/mL — ABNORMAL HIGH (ref 0.350–4.500)

## 2023-01-02 LAB — T4, FREE: Free T4: 1.17 ng/dL — ABNORMAL HIGH (ref 0.61–1.12)

## 2023-01-02 LAB — MAGNESIUM: Magnesium: 2 mg/dL (ref 1.7–2.4)

## 2023-01-02 NOTE — ED Provider Notes (Cosign Needed Addendum)
[facility] Provider Note   CSN: [id number] Arrival date & time: 01/02/23  1840     History  Chief Complaint  Patient presents with   Shortness of Breath    [name] is a 82 y.o. female.  82 year old female with past medical history that is significant for hypothyroid on Synthroid presents today for evaluation of several day duration of progressively worsening shortness of breath.  She states today has been the worst in terms of her dyspnea.  She states minimal activity causes significant shortness of breath.  Has chronic 3 pillow orthopnea.  Denies PND. No prior diagnosis of CHF.  Endorses some peripheral edema.  Not on any diuretic medicine.  Patient throughout the interview noted to be licking her lips.  When asked about her thirst she states all day today she has been significantly thirsty.  Since Tuesday night she has been having night sweats despite having AC on.  Reports weight gain.  After further discussion with patient she states the night sweats has been going on for several weeks.  The constant thirst is new as of today.  She is also had increase in her appetite over the past couple weeks.  The history is provided by the patient. No language interpreter was used.       Home Medications Prior to Admission medications   Medication Sig Start Date End Date Taking? Authorizing Provider  acetaminophen (TYLENOL) 500 MG tablet Take 2 tablets (1,000 mg total) by mouth every 8 (eight) hours. 07/09/22   Irving Copas, PA-C  albuterol (VENTOLIN HFA) 108 (90 Base) MCG/ACT inhaler Inhale 2 puffs into the lungs every 6 (six) hours as needed for wheezing or shortness of breath. Patient not taking: Reported on 03/04/2022 03/01/22   Damita Lack, MD  amLODipine (NORVASC) 2.5 MG tablet Take 2.5 mg by mouth at bedtime.    [provider]  Ascorbic Acid (VITAMIN C PO) Take 500 mg by mouth every morning.    [provider]  atorvastatin (LIPITOR) 40 MG tablet Take 40 mg by mouth at bedtime.    [provider]  Biotin 10 MG TABS Take 10,000 mcg by mouth every morning.    [provider]  Calcium Carbonate Antacid (CALCIUM CARBONATE PO) Take 600 mg by mouth every morning.    [provider]  Cholecalciferol (VITAMIN D) 50 MCG (2000 UT) tablet Take 2,000 Units by mouth daily.    [provider]  clonazePAM (KLONOPIN) 1 MG tablet Take 1 mg by mouth at bedtime. 07/16/22   [provider]  co-enzyme Q-10 30 MG capsule Take 30 mg by mouth daily.    [provider]  Cyanocobalamin (VITAMIN B-12 PO) Take 2,500 mcg by mouth every morning.    [provider]  docusate sodium (COLACE) 100 MG capsule Take 1 capsule (100 mg total) by mouth 2 (two) times daily. Patient taking differently: Take 100 mg by mouth daily. 07/09/22   Irving Copas, PA-C  gabapentin (NEURONTIN) 100 MG capsule Take 3 capsules (300 mg total) by mouth at bedtime. 09/12/22   Pieter Partridge, DO  hydrocortisone (CORTEF) 5 MG tablet Take 1 tablet (5 mg total) by mouth as directed. 2 tabs with breakfast, and 1 tablet in the afternoon 08/04/22   Shamleffer, Melanie Crazier, MD  ibuprofen (ADVIL) 400 MG tablet Take 400 mg by mouth 3 (three) times daily.    [provider]  lamoTRIgine (LAMICTAL) 200  MG tablet Take 200 mg by mouth at bedtime. 04/06/16   [provider]  levothyroxine (SYNTHROID) 125 MCG tablet Take 1 tablet (125 mcg total) by mouth as directed. 1.5 tablet on Sundays, 1 tablet Monday through Saturday Patient taking differently: Take 125 mcg by mouth daily before breakfast. 10/11/21   Shamleffer, Melanie Crazier, MD  memantine (NAMENDA) 10 MG tablet Take 10 mg by mouth 2 (two) times daily. 06/21/20   [provider]  methocarbamol (ROBAXIN) 500 MG tablet Take 1 tablet (500 mg total) by mouth every 6 (six) hours as needed for muscle spasms (muscle pain). 07/09/22    Irving Copas, PA-C  metoprolol tartrate (LOPRESSOR) 25 MG tablet Take 25 mg by mouth 2 (two) times daily.    [provider]  Multiple Vitamin (MULTIVITAMIN WITH MINERALS) TABS tablet Take 1 tablet by mouth every morning.    [provider]  OLANZapine (ZYPREXA) 15 MG tablet Take 30 mg by mouth at bedtime.    [provider]  Omega-3 Fatty Acids (FISH OIL) 1200 MG CAPS Take 1,200 mg by mouth every morning.    [provider]  omeprazole (PRILOSEC) 40 MG capsule Take 40 mg by mouth in the morning.    [provider]  oxyCODONE (OXY IR/ROXICODONE) 5 MG immediate release tablet Take 1 tablet (5 mg total) by mouth every 4 (four) hours as needed for severe pain. Patient not taking: Reported on 08/04/2022 07/09/22   Irving Copas, PA-C  polyethylene glycol (MIRALAX / GLYCOLAX) 17 g packet Take 17 g by mouth daily as needed for mild constipation. 07/09/22   Irving Copas, PA-C  polyvinyl alcohol (LIQUIFILM TEARS) 1.4 % ophthalmic solution Place 1 drop into both eyes as needed for dry eyes.    [provider]  ramelteon (ROZEREM) 8 MG tablet Take 8 mg by mouth at bedtime.    [provider]  rosuvastatin (CRESTOR) 20 MG tablet Take 1 tablet (20 mg total) by mouth daily. Need labs from PCP 05/30/22   Skeet Latch, MD  spironolactone (ALDACTONE) 25 MG tablet TAKE 1 TABLET (25 MG TOTAL) BY MOUTH DAILY. 03/13/22   Skeet Latch, MD  traMADol (ULTRAM) 50 MG tablet Take 50-100 mg by mouth every 6 (six) hours as needed. 07/30/22   [provider]  traZODone (DESYREL) 50 MG tablet Take 150 mg by mouth at bedtime.    [provider]      Allergies    Lithium, Fluoxetine, Macrodantin, Mirabegron, Nitrofurantoin, Paxil [paroxetine], Prednisone, Prozac [fluoxetine hcl], Sertraline hcl, Solifenacin, Sulfa antibiotics, and Wellbutrin [bupropion hcl]    Review of Systems   Review of Systems  Constitutional:  Positive for  diaphoresis (occasionally). Negative for chills and fever.  Respiratory:  Positive for shortness of breath. Negative for cough.   Cardiovascular:  Positive for leg swelling. Negative for chest pain and palpitations.  Gastrointestinal:  Negative for abdominal pain, nausea and vomiting.  Endocrine: Positive for polydipsia.  Genitourinary:  Negative for dysuria and flank pain.  Neurological:  Negative for light-headedness.  All other systems reviewed and are negative.   Physical Exam Updated Vital Signs BP (!) 185/72   Pulse 100   Temp 98.2 F (36.8 C) (Oral)   Resp 19   SpO2 97%  Physical Exam Vitals and nursing note reviewed.  Constitutional:      General: She is not in acute distress.    Appearance: Normal appearance. She is not ill-appearing.  HENT:     Head:  Normocephalic and atraumatic.     Nose: Nose normal.  Eyes:     General: No scleral icterus.    Extraocular Movements: Extraocular movements intact.     Conjunctiva/sclera: Conjunctivae normal.  Cardiovascular:     Rate and Rhythm: Normal rate and regular rhythm.     Pulses: Normal pulses.     Heart sounds: Normal heart sounds.  Pulmonary:     Effort: Pulmonary effort is normal. No respiratory distress.     Breath sounds: Normal breath sounds. No wheezing or rales.  Abdominal:     General: There is no distension.     Tenderness: There is no abdominal tenderness.  Musculoskeletal:        General: Normal range of motion.     Cervical back: Normal range of motion.  Skin:    General: Skin is warm and dry.  Neurological:     General: No focal deficit present.     Mental Status: She is alert. Mental status is at baseline.     ED Results / Procedures / Treatments   Labs (all labs ordered are listed, but only abnormal results are displayed) Labs Reviewed  I-STAT VENOUS BLOOD GAS, ED - Abnormal; Notable for the following components:      Result Value   pH, Ven 7.465 (*)    pCO2, Ven 32.2 (*)    pO2, Ven 123 (*)     Calcium, Ion 1.10 (*)    All other components within normal limits  CBC WITH DIFFERENTIAL/PLATELET  COMPREHENSIVE METABOLIC PANEL  MAGNESIUM  BRAIN NATRIURETIC PEPTIDE  TSH  LACTIC ACID, PLASMA  LACTIC ACID, PLASMA  TROPONIN I (HIGH SENSITIVITY)    EKG EKG Interpretation  Date/Time:  Friday January 02 2023 18:49:42 EST Ventricular Rate:  101 PR Interval:  181 QRS Duration: 137 QT Interval:  372 QTC Calculation: 483 R Axis:   9 Text Interpretation: Sinus tachycardia Probable left atrial enlargement Right bundle branch block LVH with IVCD and secondary repol abnrm No significant change since last tracing Confirmed by Isla Pence [phone]) on 01/02/2023 7:52:03 PM  Radiology DG Chest Portable 1 View  Result Date: 01/02/2023 CLINICAL DATA:  Dyspnea. EXAM: PORTABLE CHEST 1 VIEW COMPARISON:  July 11, 2022. FINDINGS: The heart size and mediastinal contours are within normal limits. Right lung is clear. Minimal left basilar subsegmental atelectasis is noted. The visualized skeletal structures are unremarkable. IMPRESSION: Minimal left basilar subsegmental atelectasis. Electronically Signed   By: Marijo Conception M.D.   On: 01/02/2023 19:53    Procedures Procedures    Medications Ordered in ED Medications - No data to display  ED Course/ Medical Decision Making/ A&P                             Medical Decision Making Amount and/or Complexity of Data Reviewed Labs: ordered. Radiology: ordered.   Medical Decision Making / ED Course   This patient presents to the ED for concern of shortness of breath, this involves an extensive number of treatment options, and is a complaint that carries with it a high risk of complications and morbidity.  The differential diagnosis includes ACS, pneumonia, PE, viral URI, thyroid storm  MDM: 82 year old female presents today for evaluation of shortness of breath along with excessive thirst, weight gain, night sweats and is unable several  weeks.  Shortness of breath she states it has been progressively worsening over the past 5 days.  Significant exertional dyspnea.  Without abdominal distention.  Workup reveals VBG without acute concerns.  CBC which is unremarkable.  CMP shows glucose 121 otherwise unremarkable.  Respiratory panel unremarkable.  Magnesium 2.0.  BNP within normal limits.  Without lactic acidosis.  TSH elevated at 5.1.  Will further evaluate by ordering CT angio.  Patient's tachycardia, hypertension self resolved throughout the emergency room stay.  Patient signed out to Rob PA-c to follow up on CTA chest. If negative and patient can ambulate without desat suspect she can be safely discharged.   Case discussed with attending.   Lab Tests: -I ordered, reviewed, and interpreted labs.   The pertinent results include:   Labs Reviewed  COMPREHENSIVE METABOLIC PANEL - Abnormal; Notable for the following components:      Result Value   Glucose, Bld 121 (*)    All other components within normal limits  TSH - Abnormal; Notable for the following components:   TSH 5.118 (*)    All other components within normal limits  LACTIC ACID, PLASMA - Abnormal; Notable for the following components:   Lactic Acid, Venous 2.0 (*)    All other components within normal limits  I-STAT VENOUS BLOOD GAS, ED - Abnormal; Notable for the following components:   pH, Ven 7.465 (*)    pCO2, Ven 32.2 (*)    pO2, Ven 123 (*)    Calcium, Ion 1.10 (*)    All other components within normal limits  TROPONIN I (HIGH SENSITIVITY) - Abnormal; Notable for the following components:   Troponin I (High Sensitivity) 62 (*)    All other components within normal limits  RESP PANEL BY RT-PCR (RSV, FLU A&B, COVID)  RVPGX2  CBC WITH DIFFERENTIAL/PLATELET  MAGNESIUM  BRAIN NATRIURETIC PEPTIDE  LACTIC ACID, PLASMA  T4, FREE  TROPONIN I (HIGH SENSITIVITY)      EKG  EKG Interpretation  Date/Time:  Friday January 02 2023 18:49:42 EST Ventricular  Rate:  101 PR Interval:  181 QRS Duration: 137 QT Interval:  372 QTC Calculation: 483 R Axis:   9 Text Interpretation: Sinus tachycardia Probable left atrial enlargement Right bundle branch block LVH with IVCD and secondary repol abnrm No significant change since last tracing Confirmed by Isla Pence [phone]) on 01/02/2023 7:52:03 PM         Imaging Studies ordered: I ordered imaging studies including cxr, cta chest PE study I independently visualized and interpreted imaging. I agree with the radiologist interpretation   Medicines ordered and prescription drug management: No orders of the defined types were placed in this encounter.   -I have reviewed the patients home medicines and have made adjustments as needed   Cardiac Monitoring: The patient was maintained on a cardiac monitor.  I personally viewed and interpreted the cardiac monitored which showed an underlying rhythm of: sinus tach initially improved to NSR  Reevaluation: After the interventions noted above, I reevaluated the patient and found that they have :improved  Co morbidities that complicate the patient evaluation  Past Medical History:  Diagnosis Date   Adrenal insufficiency ([place]) 07/25/2021   Anxiety    Arrhythmia    right bundle branch block   Bipolar 1 disorder ([place])    Cancer ([place])    right breast cancer   Depression    Dyspnea    with exertion   GERD (gastroesophageal reflux disease)    Headache    Heart murmur    Hyperlipidemia    Hypertension    Hypothyroidism    Morbid obesity ([place])  Osteoarthritis    Pneumonia    PONV (postoperative nausea and vomiting)    Schizo-affective schizophrenia ([place])    Thyroid disease    hypothyroidism      Dispostion: Patient signed out to Rob PA-C to follow up on CTA chest.  Final Clinical Impression(s) / ED Diagnoses Final diagnoses:  None    Rx / DC Orders ED Discharge Orders     None         Evlyn Courier, PA-C 01/02/23 2349     Evlyn Courier, PA-C 01/02/23 2350    Isla Pence, MD 01/07/23 [phone]

## 2023-01-02 NOTE — ED Triage Notes (Signed)
Pt bib [facility] with shob X5-6 days, with worsening today along with worsening bil edema. Pt placed on CPAP en route and took off on arrival due to pt request. Rales to L lower lobe with decreased air movement to the L. Pt 93-95% on RA.

## 2023-01-03 ENCOUNTER — Emergency Department (HOSPITAL_COMMUNITY): Payer: Medicare HMO

## 2023-01-03 DIAGNOSIS — I2510 Atherosclerotic heart disease of native coronary artery without angina pectoris: Secondary | ICD-10-CM | POA: Diagnosis not present

## 2023-01-03 DIAGNOSIS — I10 Essential (primary) hypertension: Secondary | ICD-10-CM | POA: Diagnosis not present

## 2023-01-03 DIAGNOSIS — J9811 Atelectasis: Secondary | ICD-10-CM | POA: Diagnosis not present

## 2023-01-03 DIAGNOSIS — Z7401 Bed confinement status: Secondary | ICD-10-CM | POA: Diagnosis not present

## 2023-01-03 DIAGNOSIS — Z0389 Encounter for observation for other suspected diseases and conditions ruled out: Secondary | ICD-10-CM | POA: Diagnosis not present

## 2023-01-03 DIAGNOSIS — I700 Atherosclerosis of aorta: Secondary | ICD-10-CM | POA: Diagnosis not present

## 2023-01-03 MED ORDER — IOHEXOL 350 MG/ML SOLN
75.0000 mL | Freq: Once | INTRAVENOUS | Status: AC | PRN
  Administered 2023-01-03: 75 mL via INTRAVENOUS

## 2023-01-03 NOTE — ED Notes (Signed)
Pt ambulated fairly well with a front wheel walker, aprox 20 ft.  Pulse ox was around 90-94 the entire time without oxygen on.  Tolerated well, just stated that she was shaky.  No complaints about breathing.

## 2023-01-03 NOTE — ED Provider Notes (Signed)
Patient BIB for worsening dyspnea over the past few days.  She is signed out to me at shift change pending CTA PE study.   CT negative for PE.  Trops are flat in the 60s.  Thought to be demand.  No longer having any difficulty breathing.  Speaks in complete sentences.  Ambulates without SOB.  O2 sat remains above 90%.    On reassessment she states that she would like to be discharged.  She has follow-up with her doctor on Monday.  She is encouraged to return if symptoms change or worsen.   Montine Circle, PA-C 01/03/23 [place], MD 01/03/23 [phone]

## 2023-01-03 NOTE — ED Notes (Signed)
Patient transported to CT 

## 2023-01-03 NOTE — ED Notes (Signed)
Pt lives on the independent living side but left her key at the front desk. This RN spoke with Designer, television/film set at the front desk who stated she would be waiting on her and have her key ready. [facility] has been called.

## 2023-01-05 DIAGNOSIS — R61 Generalized hyperhidrosis: Secondary | ICD-10-CM | POA: Diagnosis not present

## 2023-01-05 DIAGNOSIS — R635 Abnormal weight gain: Secondary | ICD-10-CM | POA: Diagnosis not present

## 2023-01-05 DIAGNOSIS — R0602 Shortness of breath: Secondary | ICD-10-CM | POA: Diagnosis not present

## 2023-01-19 ENCOUNTER — Institutional Professional Consult (permissible substitution): Payer: Medicare HMO | Admitting: Pulmonary Disease

## 2023-01-22 ENCOUNTER — Encounter: Payer: Self-pay | Admitting: Pulmonary Disease

## 2023-01-22 ENCOUNTER — Ambulatory Visit (INDEPENDENT_AMBULATORY_CARE_PROVIDER_SITE_OTHER): Payer: Medicare HMO | Admitting: Pulmonary Disease

## 2023-01-22 VITALS — BP 150/70 | HR 88 | Ht 62.5 in | Wt 191.8 lb

## 2023-01-22 DIAGNOSIS — I10 Essential (primary) hypertension: Secondary | ICD-10-CM

## 2023-01-22 DIAGNOSIS — R0602 Shortness of breath: Secondary | ICD-10-CM

## 2023-01-22 MED ORDER — AEROCHAMBER MV MISC: 0 refills | Status: DC

## 2023-01-22 MED ORDER — FLUTICASONE-SALMETEROL 115-21 MCG/ACT IN AERO: 2.0000 | INHALATION_SPRAY | Freq: Two times a day (BID) | RESPIRATORY_TRACT | 2 refills | Status: DC

## 2023-01-22 NOTE — Progress Notes (Signed)
Synopsis: Referred in February 2024 for shortness of breath by Thayer Ohm, PA  Subjective:   PATIENT ID: [name] GENDER: female DOB: February 20, 1941, MRN: [id number]  HPI  Chief Complaint  Patient presents with   Consult    SOB during exertion and rest   [name] is an 82 year old woman, former smoker with history adrenal insufficiency, bipolar disorder, GERD, hypertension, hyperlipidemia, obesity and hypothyroidism who is referred to pulmonary clinic for shortness of breath.   She developed shortness of breath over the past couple of weeks. She went to the ER 01/02/23. CTA was negative for pulmonary emboli and EKG did no show ischemic changes. Her troponins were flat. Blood pressure was 185/41mHg. She continues to have exertional dyspnea with wheezing, cough and production of white mucous. She has had exertional dyspnea for along time, but feels her symptoms are different now. No history of wheezing. She denies any history of sleep apnea or waking up gasping.   She quit smoking 40 years ago. She smoked 2 packs per day for 14 years. She has history of second hand smoke exposure in childhood. No history of dust or chemical exposures. 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  Past Medical History:  Diagnosis Date   [place]al [place] 07/25/20[place]   Anxiety    Arrhythmia    right bundle branch block   Bipolar 1 disorder (HCC)    Cancer (HCC)    right breast cancer   Depression    Dyspnea    with exertion   GERD (gastroesophageal reflux disease)    Headache    Heart murmur    Hyperlipidemia    Hypertension    Hypothyroidism    Morbid obesity (HCC)    Osteoarthritis    Pneumonia    PONV (postoperative nausea and vomiting)    Sch[place]ive [place]    Thyroid disease    hypothyroidism     Family History  Problem Relation Age of Onset   Hypertension Mother    Lung cancer Brother    Schizophrenia Brother      Social  History   Socioeconomic History   Marital status: Divorced    Spouse name: Not on file   Number of children: Not on file   Years of education: Not on file   Highest education level: Not on file  Occupational History   Not on file  Tobacco Use   Smoking status: Former    Types: Cigarettes    Quit date: 12/08/1978    Years since quitting: 44.1   Smokeless tobacco: Never  Vaping Use   Vaping Use: Never used  Substance and Sexual Activity   Alcohol use: Never   Drug use: No   Sexual activity: Not on file  Other Topics Concern   Not on file  Social History Narrative   [place] - states in independent living / has a nMarine scientisthelps her with meds      Degrees in [place]      No caffeine   Right handed   One story      Social Determinants of Health   Financial Resource Strain: Not on file  Food Insecurity: No Food Insecurity (08/28/2022)   Hunger Vital Sign    Worried About Running Out of Food in the Last Year: Never true    Ran Out of Food in the Last Year: Never true  Transportation Needs:  No Transportation Needs (08/28/2022)   PRAPARE - Hydrologist (Medical): No    Lack of Transportation (Non-Medical): No  Physical Activity: Not on file  Stress: Not on file  Social Connections: Not on file  Intimate Partner Violence: Not on file     Allergies  Allergen Reactions   Lithium Nausea Only   Fluoxetine Other (See Comments)    Pt felt crazy   Macrodantin Other (See Comments)    Unknown reaction   Mirabegron Other (See Comments)    ineffective   Nitrofurantoin Other (See Comments)    Other reaction(s): Did not agree with me   Paxil [Paroxetine] Other (See Comments)    Made pt feel crazy    Prednisone Other (See Comments)    Makes her feel very jittery   Prozac [Fluoxetine Hcl] Other (See Comments)    Pt felt crazy    Sertraline Hcl Other (See Comments)    Unknown reaction    Solifenacin Other (See Comments)    Ineffective    Sulfa Antibiotics Other (See Comments)    Unknown reaction   Wellbutrin [Bupropion Hcl] Other (See Comments)    Unknown reaction     Outpatient Medications Prior to Visit  Medication Sig Dispense Refill   acetaminophen (TYLENOL) 500 MG tablet Take 2 tablets (1,000 mg total) by mouth every 8 (eight) hours. 30 tablet 0   albuterol (VENTOLIN HFA) 108 (90 Base) MCG/ACT inhaler Inhale 2 puffs into the lungs every 6 (six) hours as needed for wheezing or shortness of breath. 8 g 2   amLODipine (NORVASC) 2.5 MG tablet Take 2.5 mg by mouth at bedtime.     Ascorbic Acid (VITAMIN C PO) Take 500 mg by mouth every morning.     atorvastatin (LIPITOR) 40 MG tablet Take 40 mg by mouth at bedtime.     Biotin 10 MG TABS Take 10,000 mcg by mouth every morning.     Calcium Carbonate Antacid (CALCIUM CARBONATE PO) Take 600 mg by mouth every morning.     Cholecalciferol (VITAMIN D) 50 MCG (2000 UT) tablet Take 2,000 Units by mouth daily.     clonazePAM (KLONOPIN) 1 MG tablet Take 1 mg by mouth at bedtime.     co-enzyme Q-10 30 MG capsule Take 30 mg by mouth daily.     Cyanocobalamin (VITAMIN B-12 PO) Take 2,500 mcg by mouth every morning.     docusate sodium (COLACE) 100 MG capsule Take 1 capsule (100 mg total) by mouth 2 (two) times daily. (Patient taking differently: Take 100 mg by mouth daily.) 10 capsule 0   gabapentin (NEURONTIN) 100 MG capsule Take 3 capsules (300 mg total) by mouth at bedtime. 90 capsule 5   hydrocortisone (CORTEF) 5 MG tablet Take 1 tablet (5 mg total) by mouth as directed. 2 tabs with breakfast, and 1 tablet in the afternoon 300 tablet 3   ibuprofen (ADVIL) 400 MG tablet Take 400 mg by mouth 3 (three) times daily.     lamoTRIgine (LAMICTAL) 200 MG tablet Take 200 mg by mouth at bedtime.  12   levothyroxine (SYNTHROID) 125 MCG tablet Take 1 tablet (125 mcg total) by mouth as directed. 1.5 tablet on Sundays, 1 tablet Monday through  Saturday (Patient taking differently: Take 125 mcg by mouth daily before breakfast.) 98 tablet 2   memantine (NAMENDA) 10 MG tablet Take 10 mg by mouth 2 (two) times daily.     metoprolol tartrate (LOPRESSOR) 25 MG tablet Take 25  mg by mouth 2 (two) times daily.     Multiple Vitamin (MULTIVITAMIN WITH MINERALS) TABS tablet Take 1 tablet by mouth every morning.     OLANZapine (ZYPREXA) 15 MG tablet Take 30 mg by mouth at bedtime.     Omega-3 Fatty Acids (FISH OIL) 1200 MG CAPS Take 1,200 mg by mouth every morning.     omeprazole (PRILOSEC) 40 MG capsule Take 40 mg by mouth in the morning.     polyethylene glycol (MIRALAX / GLYCOLAX) 17 g packet Take 17 g by mouth daily as needed for mild constipation. 14 each 0   polyvinyl alcohol (LIQUIFILM TEARS) 1.4 % ophthalmic solution Place 1 drop into both eyes as needed for dry eyes.     ramelteon (ROZEREM) 8 MG tablet Take 8 mg by mouth at bedtime.     rosuvastatin (CRESTOR) 20 MG tablet Take 1 tablet (20 mg total) by mouth daily. Need labs from PCP 90 tablet 1   spironolactone (ALDACTONE) 25 MG tablet TAKE 1 TABLET (25 MG TOTAL) BY MOUTH DAILY. 90 tablet 0   traZODone (DESYREL) 50 MG tablet Take 150 mg by mouth at bedtime.     methocarbamol (ROBAXIN) 500 MG tablet Take 1 tablet (500 mg total) by mouth every 6 (six) hours as needed for muscle spasms (muscle pain). (Patient not taking: Reported on 01/22/2023) 40 tablet 0   oxyCODONE (OXY IR/ROXICODONE) 5 MG immediate release tablet Take 1 tablet (5 mg total) by mouth every 4 (four) hours as needed for severe pain. (Patient not taking: Reported on 08/04/2022) 42 tablet 0   traMADol (ULTRAM) 50 MG tablet Take 50-100 mg by mouth every 6 (six) hours as needed. (Patient not taking: Reported on 01/22/2023)     No facility-administered medications prior to visit.   Review of Systems  Constitutional:  Negative for chills, fever, malaise/fatigue and weight loss.  HENT:  Negative for congestion, sinus pain and sore  throat.   Eyes: Negative.   Respiratory:  Positive for cough, shortness of breath and wheezing. Negative for hemoptysis and sputum production.   Cardiovascular:  Negative for chest pain, palpitations, orthopnea, claudication and leg swelling.  Gastrointestinal:  Negative for abdominal pain, heartburn, nausea and vomiting.  Genitourinary: Negative.   Musculoskeletal:  Negative for joint pain and myalgias.  Skin:  Negative for rash.  Neurological:  Negative for weakness.  Endo/Heme/Allergies: Negative.   Psychiatric/Behavioral: Negative.     Objective:   Vitals:   01/22/23 0842 01/22/23 0900  BP: (!) 170/90 (!) 150/70  Pulse: 88   SpO2: 96%   Weight: 191 lb 12.8 oz (87 kg)   Height: 5' 2.5" (1.588 m)    Physical Exam Constitutional:      General: She is not in acute distress.    Appearance: She is not ill-appearing.  HENT:     Head: Normocephalic and atraumatic.  Eyes:     General: No scleral icterus.    Conjunctiva/sclera: Conjunctivae normal.     Pupils: Pupils are equal, round, and reactive to light.  Cardiovascular:     Rate and Rhythm: Normal rate and regular rhythm.     Pulses: Normal pulses.     Heart sounds: Normal heart sounds. No murmur heard. Pulmonary:     Effort: Pulmonary effort is normal.     Breath sounds: Normal breath sounds. No wheezing, rhonchi or rales.  Abdominal:     General: Bowel sounds are normal.     Palpations: Abdomen is soft.  Musculoskeletal:  Right lower leg: No edema.     Left lower leg: No edema.  Lymphadenopathy:     Cervical: No cervical adenopathy.  Skin:    General: Skin is warm and dry.  Neurological:     General: No focal deficit present.     Mental Status: She is alert.  Psychiatric:        Mood and Affect: Mood normal.        Behavior: Behavior normal.        Thought Content: Thought content normal.        Judgment: Judgment normal.    CBC    Component Value Date/Time   WBC 5.8 01/02/2023 1900   RBC 4.18  01/02/2023 1900   HGB 12.6 01/02/2023 1952   HGB 14.4 01/01/2021 1508   HGB 13.1 05/11/2014 0910   HCT 37.0 01/02/2023 1952   HCT 39.1 05/11/2014 0910   PLT 204 01/02/2023 1900   PLT 258 01/01/2021 1508   PLT 250 05/11/2014 0910   MCV 94.3 01/02/2023 1900   MCV 91.5 05/11/2014 0910   MCH 31.6 01/02/2023 1900   MCHC 33.5 01/02/2023 1900   RDW 14.6 01/02/2023 1900   RDW 14.0 05/11/2014 0910   LYMPHSABS 1.4 01/02/2023 1900   LYMPHSABS 2.4 05/11/2014 0910   MONOABS 0.7 01/02/2023 1900   MONOABS 0.5 05/11/2014 0910   EOSABS 0.0 01/02/2023 1900   EOSABS 0.1 05/11/2014 0910   BASOSABS 0.0 01/02/2023 1900   BASOSABS 0.0 05/11/2014 0910      Latest Ref Rng & Units 01/02/2023    7:52 PM 01/02/2023    7:00 PM 12/14/2022   12:17 AM  BMP  Glucose 70 - 99 mg/dL  121  142   BUN 8 - 23 mg/dL  22  18   Creatinine 0.44 - 1.00 mg/dL  0.88  0.72   Sodium 135 - 145 mmol/L 139  137  140   Potassium 3.5 - 5.1 mmol/L 4.3  4.5  4.4   Chloride 98 - 111 mmol/L  102  102   CO2 22 - 32 mmol/L  23  27   Calcium 8.9 - 10.3 mg/dL  9.5  9.6    Chest imaging: CTA Chest PE 01/03/23 Cardiovascular: Satisfactory opacification of the pulmonary arteries to the segmental level. No evidence of pulmonary embolism. Mild cardiomegaly. Coronary artery and aortic atherosclerotic calcification. No pericardial effusion.   Mediastinum/Nodes: Stable mild mediastinal adenopathy. For example a right paratracheal node measures 1.1 cm (8/84). Unremarkable esophagus.   Lungs/Pleura: Respiratory motion obscures fine detail. Expiratory phase exam. No focal consolidation, pleural effusion, or pneumothorax. Bibasilar atelectasis/scarring. No pleural effusion or pneumothorax.  PFT:     No data to display          Labs:  Path:  Echo 08/18/20: LV EF 70-75%. LV is hyperdynamic. RV sis is mildly enlarged with normal function.  Heart Catheterization:  Assessment & Plan:   Shortness of breath - Plan:  ECHOCARDIOGRAM COMPLETE, Pulmonary Function Test, fluticasone-salmeterol (ADVAIR HFA) 115-21 MCG/ACT inhaler  Hypertension, unspecified type - Plan: Home sleep test  Discussion: [name] is an 82 year old woman, former smoker with history adrenal insufficiency, bipolar disorder, GERD, hypertension, hyperlipidemia, obesity and hypothyroidism who is referred to pulmonary clinic for shortness of breath.   Her shortness of breath could be related to obstructive airways disease given her smoking history and second hand smoke exposure. Her blood pressure is uncontrolled on two separate occasions which could be contributing.   She  is to try advair 115-29mg 2 puffs twice daily with spacer.   We will schedule her for an echo and a home sleep study.  Follow up in 1 month with pulmonary function tests.  JFreda Jackson MD [facility] Office: [phone]   Current Outpatient Medications:    acetaminophen (TYLENOL) 500 MG tablet, Take 2 tablets (1,000 mg total) by mouth every 8 (eight) hours., Disp: 30 tablet, Rfl: 0   albuterol (VENTOLIN HFA) 108 (90 Base) MCG/ACT inhaler, Inhale 2 puffs into the lungs every 6 (six) hours as needed for wheezing or shortness of breath., Disp: 8 g, Rfl: 2   amLODipine (NORVASC) 2.5 MG tablet, Take 2.5 mg by mouth at bedtime., Disp: , Rfl:    Ascorbic Acid (VITAMIN C PO), Take 500 mg by mouth every morning., Disp: , Rfl:    atorvastatin (LIPITOR) 40 MG tablet, Take 40 mg by mouth at bedtime., Disp: , Rfl:    Biotin 10 MG TABS, Take 10,000 mcg by mouth every morning., Disp: , Rfl:    Calcium Carbonate Antacid (CALCIUM CARBONATE PO), Take 600 mg by mouth every morning., Disp: , Rfl:    Cholecalciferol (VITAMIN D) 50 MCG (2000 UT) tablet, Take 2,000 Units by mouth daily., Disp: , Rfl:    clonazePAM (KLONOPIN) 1 MG tablet, Take 1 mg by mouth at bedtime., Disp: , Rfl:    co-enzyme Q-10 30 MG capsule, Take 30 mg by mouth daily., Disp: , Rfl:     Cyanocobalamin (VITAMIN B-12 PO), Take 2,500 mcg by mouth every morning., Disp: , Rfl:    docusate sodium (COLACE) 100 MG capsule, Take 1 capsule (100 mg total) by mouth 2 (two) times daily. (Patient taking differently: Take 100 mg by mouth daily.), Disp: 10 capsule, Rfl: 0   fluticasone-salmeterol (ADVAIR HFA) 115-21 MCG/ACT inhaler, Inhale 2 puffs into the lungs 2 (two) times daily., Disp: 1 each, Rfl: 2   gabapentin (NEURONTIN) 100 MG capsule, Take 3 capsules (300 mg total) by mouth at bedtime., Disp: 90 capsule, Rfl: 5   hydrocortisone (CORTEF) 5 MG tablet, Take 1 tablet (5 mg total) by mouth as directed. 2 tabs with breakfast, and 1 tablet in the afternoon, Disp: 300 tablet, Rfl: 3   ibuprofen (ADVIL) 400 MG tablet, Take 400 mg by mouth 3 (three) times daily., Disp: , Rfl:    lamoTRIgine (LAMICTAL) 200 MG tablet, Take 200 mg by mouth at bedtime., Disp: , Rfl: 12   levothyroxine (SYNTHROID) 125 MCG tablet, Take 1 tablet (125 mcg total) by mouth as directed. 1.5 tablet on Sundays, 1 tablet Monday through Saturday (Patient taking differently: Take 125 mcg by mouth daily before breakfast.), Disp: 98 tablet, Rfl: 2   memantine (NAMENDA) 10 MG tablet, Take 10 mg by mouth 2 (two) times daily., Disp: , Rfl:    metoprolol tartrate (LOPRESSOR) 25 MG tablet, Take 25 mg by mouth 2 (two) times daily., Disp: , Rfl:    Multiple Vitamin (MULTIVITAMIN WITH MINERALS) TABS tablet, Take 1 tablet by mouth every morning., Disp: , Rfl:    OLANZapine (ZYPREXA) 15 MG tablet, Take 30 mg by mouth at bedtime., Disp: , Rfl:    Omega-3 Fatty Acids (FISH OIL) 1200 MG CAPS, Take 1,200 mg by mouth every morning., Disp: , Rfl:    omeprazole (PRILOSEC) 40 MG capsule, Take 40 mg by mouth in the morning., Disp: , Rfl:    polyethylene glycol (MIRALAX / GLYCOLAX) 17 g packet, Take 17 g by mouth daily as  needed for mild constipation., Disp: 14 each, Rfl: 0   polyvinyl alcohol (LIQUIFILM TEARS) 1.4 % ophthalmic solution, Place 1 drop  into both eyes as needed for dry eyes., Disp: , Rfl:    ramelteon (ROZEREM) 8 MG tablet, Take 8 mg by mouth at bedtime., Disp: , Rfl:    rosuvastatin (CRESTOR) 20 MG tablet, Take 1 tablet (20 mg total) by mouth daily. Need labs from PCP, Disp: 90 tablet, Rfl: 1   Spacer/Aero-Holding Chambers (AEROCHAMBER MV) inhaler, Use as instructed, Disp: 1 each, Rfl: 0   spironolactone (ALDACTONE) 25 MG tablet, TAKE 1 TABLET (25 MG TOTAL) BY MOUTH DAILY., Disp: 90 tablet, Rfl: 0   traZODone (DESYREL) 50 MG tablet, Take 150 mg by mouth at bedtime., Disp: , Rfl:    methocarbamol (ROBAXIN) 500 MG tablet, Take 1 tablet (500 mg total) by mouth every 6 (six) hours as needed for muscle spasms (muscle pain). (Patient not taking: Reported on 01/22/2023), Disp: 40 tablet, Rfl: 0   oxyCODONE (OXY IR/ROXICODONE) 5 MG immediate release tablet, Take 1 tablet (5 mg total) by mouth every 4 (four) hours as needed for severe pain. (Patient not taking: Reported on 08/04/2022), Disp: 42 tablet, Rfl: 0   traMADol (ULTRAM) 50 MG tablet, Take 50-100 mg by mouth every 6 (six) hours as needed. (Patient not taking: Reported on 01/22/2023), Disp: , Rfl:

## 2023-01-22 NOTE — Patient Instructions (Signed)
Try advair inhaler 2 puffs twice daily - rinse mouth out after each use  We will order you a spacer to use with the inhaler  We will schedule you for an echocardiogram and reach out to Dr. Oval Linsey for follow up appointment.  We will schedule you for a sleep study.  Follow up in 1 month with pulmonary function tests.

## 2023-01-23 DIAGNOSIS — M25511 Pain in right shoulder: Secondary | ICD-10-CM | POA: Diagnosis not present

## 2023-01-23 DIAGNOSIS — M545 Low back pain, unspecified: Secondary | ICD-10-CM | POA: Diagnosis not present

## 2023-02-01 ENCOUNTER — Encounter: Payer: Self-pay | Admitting: Pulmonary Disease

## 2023-02-02 DIAGNOSIS — Z9181 History of falling: Secondary | ICD-10-CM | POA: Diagnosis not present

## 2023-02-02 DIAGNOSIS — R632 Polyphagia: Secondary | ICD-10-CM | POA: Diagnosis not present

## 2023-02-02 DIAGNOSIS — E2740 Unspecified adrenocortical insufficiency: Secondary | ICD-10-CM | POA: Diagnosis not present

## 2023-02-02 DIAGNOSIS — R946 Abnormal results of thyroid function studies: Secondary | ICD-10-CM | POA: Diagnosis not present

## 2023-02-02 DIAGNOSIS — I700 Atherosclerosis of aorta: Secondary | ICD-10-CM | POA: Diagnosis not present

## 2023-02-02 DIAGNOSIS — F259 Schizoaffective disorder, unspecified: Secondary | ICD-10-CM | POA: Diagnosis not present

## 2023-02-09 ENCOUNTER — Encounter: Payer: Self-pay | Admitting: Internal Medicine

## 2023-02-09 ENCOUNTER — Ambulatory Visit (INDEPENDENT_AMBULATORY_CARE_PROVIDER_SITE_OTHER): Payer: Medicare HMO | Admitting: Internal Medicine

## 2023-02-09 ENCOUNTER — Telehealth: Payer: Self-pay | Admitting: Internal Medicine

## 2023-02-09 VITALS — BP 120/80 | HR 74 | Ht 62.5 in | Wt 190.0 lb

## 2023-02-09 DIAGNOSIS — E2749 Other adrenocortical insufficiency: Secondary | ICD-10-CM | POA: Diagnosis not present

## 2023-02-09 DIAGNOSIS — E039 Hypothyroidism, unspecified: Secondary | ICD-10-CM | POA: Diagnosis not present

## 2023-02-09 LAB — BASIC METABOLIC PANEL
BUN: 27 mg/dL — ABNORMAL HIGH (ref 6–23)
CO2: 30 mEq/L (ref 19–32)
Calcium: 10.3 mg/dL (ref 8.4–10.5)
Chloride: 100 mEq/L (ref 96–112)
Creatinine, Ser: 0.85 mg/dL (ref 0.40–1.20)
GFR: 63.62 mL/min (ref >60.00)
Glucose, Bld: 103 mg/dL — ABNORMAL HIGH (ref 70–99)
Potassium: 4.8 mEq/L (ref 3.5–5.1)
Sodium: 141 mEq/L (ref 135–145)

## 2023-02-09 LAB — TSH: TSH: 6.88 u[IU]/mL — ABNORMAL HIGH (ref 0.35–5.50)

## 2023-02-09 LAB — ALBUMIN: Albumin: 4.1 g/dL (ref 3.5–5.2)

## 2023-02-09 MED ORDER — LEVOTHYROXINE SODIUM 137 MCG PO TABS: 137.0000 ug | ORAL_TABLET | Freq: Every day | ORAL | 3 refills | Status: DC

## 2023-02-09 MED ORDER — HYDROCORTISONE 5 MG PO TABS: 5.0000 mg | ORAL_TABLET | ORAL | 3 refills | Status: DC

## 2023-02-09 NOTE — Progress Notes (Signed)
Name: [name]  MRN/ DOB: [id number], Jul 02, 1941    Age/ Sex: 82 y.o., female     PCP: Charlane Ferretti, MD   Reason for Endocrinology Evaluation: Adrenal insufficiency      Initial Endocrinology Clinic Visit: 07/10/2021    PATIENT IDENTIFIER: [name] is a 82 y.o., female with a past medical history of CAD, CHF, IBS, hypothyroidism and Hx of breast ca ( S/P lumpectomy and radiation 2010) . She has followed with [facility] since 07/10/2021 for consultative assistance with management of her adrenal insufficiency.   HISTORICAL SUMMARY:  She was diagnosed with Secondary adrenal insufficiency based on abnormal Cosyntropin Stim Test 60 minute cortisol of 11.8 ug/dL in 03/2021. ACTH at the lower end of normal 7 pg/mL.   Of note, the pt  has been on long term left knee intra-articular injections, declines surgery She is on Hydrocodone for pain   We started [facility] due to chronic use of intra-articular injection    THYROID HISTORY: She has also been noted with low TSH with a nadir of 0.01 uIU/mL in 11/2020. Pt on levothyroxine       HYPERCALCEMIA HISTORY: She has been noted to have hypercalcemia on 04/19/2021 with a corrected serum calcium of 10.4 (reference 8.6-10.3). Repeat labs at our clinic 07/1999 showed normal calcium at 10.3 mg, normal ionized calcium 5.45 mg/dl , normal Vit D 47.63 ng/mL as well as normal 1,25 dihydroxy vitamin D 53 pg/mL but low PTH at 12 pg/mL and low PTHrP 10 pg/mL      SUBJECTIVE:    Today (02/09/2023):  [name] is here for adrenal insufficiency, hypothyroidism.    She is accompanied by her friend Ivin Booty   Left knee pain has improved  She was evaluated by pulmonary for shortness of breath 01/2023, she was started on a trial of Advair  She continues to follow-up with oncology for Hx noninvasive breast cancer 12/2022 Denies  local neck swelling  Did not tolerate advair due to SOB  Has recent constipation that she attributes Saphris   She has excessive sweating at night  She recently received a shoulder and a back glucocorticoid injections     Hydrocortisone 5 mg, 2 tablets with breakfast and 1 tablet in the afternoon levothyroxine 125  MCG , 1 tablet daily    HISTORY:  Past Medical History:  Past Medical History:  Diagnosis Date   Adrenal insufficiency ([place]) 07/25/2021   Anxiety    Arrhythmia    right bundle branch block   Bipolar 1 disorder ([place])    Cancer ([place])    right breast cancer   Depression    Dyspnea    with exertion   GERD (gastroesophageal reflux disease)    Headache    Heart murmur    Hyperlipidemia    Hypertension    Hypothyroidism    Morbid obesity ([place])    Osteoarthritis    Pneumonia    PONV (postoperative nausea and vomiting)    Schizo-affective schizophrenia ([place])    Thyroid disease    hypothyroidism   Past Surgical History:  Past Surgical History:  Procedure Laterality Date   BREAST SURGERY     CHOLECYSTECTOMY     DILATION AND CURETTAGE OF UTERUS     ESOPHAGEAL MANOMETRY N/A 01/29/2018   Procedure: ESOPHAGEAL MANOMETRY (EM);  Surgeon: Wonda Horner, MD;  Location: [facility];  Service: Endoscopy;  Laterality: N/A;   HAND SURGERY     Right-trigger finger   IR  THORACENTESIS ASP PLEURAL SPACE W/IMG GUIDE  06/28/2018   IR THORACENTESIS ASP PLEURAL SPACE W/IMG GUIDE  07/19/2018   KNEE SURGERY     Left   TONSILLECTOMY     TOTAL KNEE ARTHROPLASTY Left 07/08/2022   Procedure: TOTAL KNEE ARTHROPLASTY;  Surgeon: Paralee Cancel, MD;  Location: [facility];  Service: Orthopedics;  Laterality: Left;   Social History:  reports that she quit smoking about 44 years ago. Her smoking use included cigarettes. She has never used smokeless tobacco. She reports that she does not drink alcohol and does not use drugs. Family History:  Family History  Problem Relation Age of Onset   Hypertension Mother    Lung cancer Brother    Schizophrenia Brother      HOME MEDICATIONS: Allergies as of  02/09/2023       Reactions   Lithium Nausea Only   Fluoxetine Other (See Comments)   Pt felt crazy   Macrodantin Other (See Comments)   Unknown reaction   Mirabegron Other (See Comments)   ineffective   Nitrofurantoin Other (See Comments)   Other reaction(s): Did not agree with me   Paxil [paroxetine] Other (See Comments)   Made pt feel crazy   Prednisone Other (See Comments)   Makes her feel very jittery   Prozac [fluoxetine Hcl] Other (See Comments)   Pt felt crazy   Sertraline Hcl Other (See Comments)   Unknown reaction   Solifenacin Other (See Comments)   Ineffective    Sulfa Antibiotics Other (See Comments)   Unknown reaction   Wellbutrin [bupropion Hcl] Other (See Comments)   Unknown reaction        Medication List        Accurate as of February 09, 2023  9:16 AM. If you have any questions, ask your nurse or doctor.          STOP taking these medications    oxyCODONE 5 MG immediate release tablet Commonly known as: Oxy IR/ROXICODONE Stopped by: Dorita Sciara, MD   traMADol 50 MG tablet Commonly known as: ULTRAM Stopped by: Dorita Sciara, MD       TAKE these medications    acetaminophen 500 MG tablet Commonly known as: TYLENOL Take 2 tablets (1,000 mg total) by mouth every 8 (eight) hours.   AeroChamber MV inhaler Use as instructed   albuterol 108 (90 Base) MCG/ACT inhaler Commonly known as: VENTOLIN HFA Inhale 2 puffs into the lungs every 6 (six) hours as needed for wheezing or shortness of breath.   amLODipine 2.5 MG tablet Commonly known as: NORVASC Take 2.5 mg by mouth at bedtime.   atorvastatin 40 MG tablet Commonly known as: LIPITOR Take 40 mg by mouth at bedtime.   Biotin 10 MG Tabs Take 10,000 mcg by mouth every morning.   CALCIUM CARBONATE PO Take 600 mg by mouth every morning.   clonazePAM 1 MG tablet Commonly known as: KLONOPIN Take 1 mg by mouth at bedtime.   co-enzyme Q-10 30 MG capsule Take 30 mg by mouth  daily.   docusate sodium 100 MG capsule Commonly known as: COLACE Take 1 capsule (100 mg total) by mouth 2 (two) times daily. What changed: when to take this   Fish Oil 1200 MG Caps Take 1,200 mg by mouth every morning.   fluticasone-salmeterol 115-21 MCG/ACT inhaler Commonly known as: Advair HFA Inhale 2 puffs into the lungs 2 (two) times daily.   gabapentin 100 MG capsule Commonly known as: Neurontin Take 3 capsules (300  mg total) by mouth at bedtime.   hydrocortisone 5 MG tablet Commonly known as: CORTEF Take 1 tablet (5 mg total) by mouth as directed. 2 tabs with breakfast, and 1 tablet in the afternoon   ibuprofen 400 MG tablet Commonly known as: ADVIL Take 400 mg by mouth 3 (three) times daily.   lamoTRIgine 200 MG tablet Commonly known as: LAMICTAL Take 200 mg by mouth at bedtime.   levothyroxine 125 MCG tablet Commonly known as: SYNTHROID Take 1 tablet (125 mcg total) by mouth as directed. 1.5 tablet on Sundays, 1 tablet Monday through Saturday What changed:  when to take this additional instructions   memantine 10 MG tablet Commonly known as: NAMENDA Take 10 mg by mouth 2 (two) times daily.   methocarbamol 500 MG tablet Commonly known as: ROBAXIN Take 1 tablet (500 mg total) by mouth every 6 (six) hours as needed for muscle spasms (muscle pain).   metoprolol tartrate 25 MG tablet Commonly known as: LOPRESSOR Take 25 mg by mouth 2 (two) times daily.   multivitamin with minerals Tabs tablet Take 1 tablet by mouth every morning.   OLANZapine 15 MG tablet Commonly known as: ZYPREXA Take 30 mg by mouth at bedtime.   omeprazole 40 MG capsule Commonly known as: PRILOSEC Take 40 mg by mouth in the morning.   polyethylene glycol 17 g packet Commonly known as: MIRALAX / GLYCOLAX Take 17 g by mouth daily as needed for mild constipation.   polyvinyl alcohol 1.4 % ophthalmic solution Commonly known as: LIQUIFILM TEARS Place 1 drop into both eyes as needed  for dry eyes.   ramelteon 8 MG tablet Commonly known as: ROZEREM Take 8 mg by mouth at bedtime.   rosuvastatin 20 MG tablet Commonly known as: CRESTOR Take 1 tablet (20 mg total) by mouth daily. Need labs from PCP   Saphris 10 MG Subl Generic drug: Asenapine Maleate Take 10 mg by mouth daily at 6 (six) AM.   spironolactone 25 MG tablet Commonly known as: ALDACTONE TAKE 1 TABLET (25 MG TOTAL) BY MOUTH DAILY.   traZODone 50 MG tablet Commonly known as: DESYREL Take 150 mg by mouth at bedtime.   VITAMIN B-12 PO Take 2,500 mcg by mouth every morning.   VITAMIN C PO Take 500 mg by mouth every morning.   Vitamin D 50 MCG (2000 UT) tablet Take 2,000 Units by mouth daily.          OBJECTIVE:   PHYSICAL EXAM: VS: BP 120/80 (BP Location: Left Arm, Patient Position: Sitting, Cuff Size: Large)   Pulse 74   Ht 5' 2.5" (1.588 m)   Wt 190 lb (86.2 kg)   SpO2 95%   BMI 34.20 kg/m    EXAM: General: Pt appears well and is in NAD  Lungs: Clear with good BS bilat with no rales, rhonchi, or wheezes  Heart: Auscultation: RRR.  Abdomen: Normoactive bowel sounds, soft, nontender, without masses or organomegaly palpable  Extremities:  BL LE: No pretibial edema normal ROM and strength.  Mental Status: Judgment, insight: Intact Orientation: Oriented to time, place, and person Mood and affect: No depression, anxiety, or agitation     DATA REVIEWED: 01/02/2023 TSH 5.118 uIU/mL    Latest Reference Range & Units 02/09/23 09:33  Sodium 135 - 145 mEq/L 141  Potassium 3.5 - 5.1 mEq/L 4.8  Chloride 96 - 112 mEq/L 100  CO2 19 - 32 mEq/L 30  Glucose 70 - 99 mg/dL 103 (H)  BUN 6 - 23  mg/dL 27 (H)  Creatinine 0.40 - 1.20 mg/dL 0.85  Calcium 8.4 - 10.5 mg/dL 10.3  Albumin 3.5 - 5.2 g/dL 4.1  GFR >60.00 mL/min 63.62    Latest Reference Range & Units 02/09/23 09:33  TSH 0.35 - 5.50 uIU/mL 6.88 (H)     ASSESSMENT / PLAN / RECOMMENDATIONS:    Secondary Adrenal Insufficiency:    -Patient had an abnormal cosyntropin stimulation test in 04/2021 -I suspect this is secondary adrenal insufficiency due to chronic intra-articular injections as well as chronic opiate use -She continues to receive intra-articular injections -We had discussed sick day rules   Medication Continue hydrocortisone 5 mg, 2 tablets with breakfast and 1 tablet in the afternoon   2. Hypothyroidism   -Her TSH continues to be above goal -She is in a senior living facility and she believes that she is getting her medications on regular basis -Will increase levothyroxine  Medication Stop levothyroxine 125 MCG , daily Start levothyroxine 137 mcg daily       F/U in 6 months     Signed electronically by: Mack Guise, MD  [facility]  [address] [place], [place] [address] Phone: [phone] FAX: [phone]      CC: Charlane Ferretti, MD [address] [place] [address] Phone: [phone]  Fax: [phone]   Return to Endocrinology clinic as below: Future Appointments  Date Time Provider [place]  02/12/2023 11:00 AM [place] None  03/06/2023  9:00 AM [facility] None  03/09/2023  9:00 AM Freddi Starr, MD [facility] None  05/01/2023  9:45 AM Benay Pike, MD [facility] None  09/14/2023  9:30 AM Pieter Partridge, DO [facility] None  12/31/2023  9:45 AM Benay Pike, MD [facility] None

## 2023-02-09 NOTE — Telephone Encounter (Signed)
Please let the patient know that her thyroid continues to be underactive and we need to increase levothyroxine from 125 mcg to 137 mcg daily.   Please remind the patient to hold biotin 3 days before any thyroid blood work is done on her  Her kidney function is normal but she will need to increase her water intake as there is some evidence of dehydration   Thanks

## 2023-02-09 NOTE — Patient Instructions (Signed)
-   Continue  Hydrocortisone 5 mg, TWO tablets every morning and ONE tablet every afternoon between  2-4 pm  - Continue Levothyroxine 125 mcg daily   ADRENAL INSUFFICIENCY SICK DAY RULES:  Should you face an extreme emotional or physical stress such as trauma, surgery or acute illness, this will require extra steroid coverage so that the body can meet that stress.   Without increasing the steroid dose you may experience severe weakness, headache, dizziness, nausea and vomiting and possibly a more serious deterioration in health.  Typically the dose of steroids will only need to be increased for a couple of days if you have an illness that is transient and managed in the community.   If you are unable to take/absorb an increased dose of steroids orally because of vomiting or diarrhea, you will urgently require steroid injections and should present to an Emergency Department.  The general advice for any serious illness is as follows: Double the normal daily steroid dose for up to 3 days if you have a temperature of more than 37.50C (99.50F) with signs of sickness, or severe emotional or physical distress Contact your primary care doctor and Endocrinologist if the illness worsens or it lasts for more than 3 days.  In cases of severe illness, urgent medical assistance should be promptly sought. If you experience vomiting/diarrhea or are unable to take steroids by mouth, please administer the Hydrocortisone injection kit and seek urgent medical help.      

## 2023-02-09 NOTE — Telephone Encounter (Signed)
Attempted to contact patient no answer and no vm

## 2023-02-10 NOTE — Telephone Encounter (Signed)
LMTCB

## 2023-02-10 NOTE — Telephone Encounter (Signed)
Patient advised and will pick up new dose

## 2023-02-12 ENCOUNTER — Ambulatory Visit: Payer: Medicare HMO

## 2023-02-12 ENCOUNTER — Telehealth (HOSPITAL_BASED_OUTPATIENT_CLINIC_OR_DEPARTMENT_OTHER): Payer: Self-pay

## 2023-02-12 DIAGNOSIS — B0089 Other herpesviral infection: Secondary | ICD-10-CM | POA: Diagnosis not present

## 2023-02-12 DIAGNOSIS — R0683 Snoring: Secondary | ICD-10-CM

## 2023-02-12 DIAGNOSIS — Z0189 Encounter for other specified special examinations: Secondary | ICD-10-CM | POA: Diagnosis not present

## 2023-02-12 DIAGNOSIS — B009 Herpesviral infection, unspecified: Secondary | ICD-10-CM | POA: Diagnosis not present

## 2023-02-12 DIAGNOSIS — I10 Essential (primary) hypertension: Secondary | ICD-10-CM

## 2023-02-12 DIAGNOSIS — L298 Other pruritus: Secondary | ICD-10-CM | POA: Diagnosis not present

## 2023-02-12 DIAGNOSIS — Z85828 Personal history of other malignant neoplasm of skin: Secondary | ICD-10-CM | POA: Diagnosis not present

## 2023-02-12 DIAGNOSIS — L308 Other specified dermatitis: Secondary | ICD-10-CM | POA: Diagnosis not present

## 2023-02-12 NOTE — Telephone Encounter (Addendum)
Contacted patient, scheduled for April 11th at 915am. Patient has limited transport so Thursdays work best for her. Address given and patient verified appointment time and address.   ----- Message from Skeet Latch, MD sent at 02/07/2023  2:23 PM EST ----- Regarding: RE: Hypertension Thank you very much.  [place], can we please get her in clinic for BP follow up?  Thanks, Tiffany ----- Message ----- From: Freddi Starr, MD Sent: 02/01/2023   8:15 PM EST To: Skeet Latch, MD Subject: Hypertension                                   Hi Dr. Oval Linsey,  I recently saw patient for shortness of breath in clinic. I am trying her on ICS/LABA inhaler therapy and getting PFTs on return visit. She last saw you in 2022 and I wanted to see if she could be evaluated for her high blood pressure as her BP has been running high at her recent ER visit and in our clinic. I have ordered an echo and home sleep study.  Thanks, Wille Glaser

## 2023-02-18 DIAGNOSIS — R0683 Snoring: Secondary | ICD-10-CM | POA: Diagnosis not present

## 2023-03-06 ENCOUNTER — Ambulatory Visit (INDEPENDENT_AMBULATORY_CARE_PROVIDER_SITE_OTHER): Payer: Medicare HMO | Admitting: Pulmonary Disease

## 2023-03-06 DIAGNOSIS — R0602 Shortness of breath: Secondary | ICD-10-CM | POA: Diagnosis not present

## 2023-03-06 LAB — PULMONARY FUNCTION TEST
DL/VA % pred: 106 %
DL/VA: 4.36 ml/min/mmHg/L
DLCO cor % pred: 81 %
DLCO cor: 14.52 ml/min/mmHg
DLCO unc % pred: 81 %
DLCO unc: 14.52 ml/min/mmHg
FEF 25-75 Post: 3.04 L/sec
FEF 25-75 Pre: 2.19 L/sec
FEF2575-%Change-Post: 38 %
FEF2575-%Pred-Post: 249 %
FEF2575-%Pred-Pre: 179 %
FEV1-%Change-Post: 8 %
FEV1-%Pred-Post: 95 %
FEV1-%Pred-Pre: 88 %
FEV1-Post: 1.65 L
FEV1-Pre: 1.53 L
FEV1FVC-%Change-Post: 2 %
FEV1FVC-%Pred-Pre: 117 %
FEV6-%Change-Post: 5 %
FEV6-%Pred-Post: 84 %
FEV6-%Pred-Pre: 80 %
FEV6-Post: 1.86 L
FEV6-Pre: 1.76 L
FEV6FVC-%Pred-Post: 106 %
FEV6FVC-%Pred-Pre: 106 %
FVC-%Change-Post: 5 %
FVC-%Pred-Post: 79 %
FVC-%Pred-Pre: 75 %
FVC-Post: 1.86 L
FVC-Pre: 1.76 L
Post FEV1/FVC ratio: 89 %
Post FEV6/FVC ratio: 100 %
Pre FEV1/FVC ratio: 87 %
Pre FEV6/FVC Ratio: 100 %
RV % pred: 50 %
RV: 1.18 L
TLC % pred: 67 %
TLC: 3.28 L

## 2023-03-06 NOTE — Progress Notes (Signed)
Full PFT performed today. °

## 2023-03-06 NOTE — Patient Instructions (Signed)
Full PFT performed today. °

## 2023-03-09 ENCOUNTER — Encounter: Payer: Self-pay | Admitting: Pulmonary Disease

## 2023-03-09 ENCOUNTER — Ambulatory Visit (INDEPENDENT_AMBULATORY_CARE_PROVIDER_SITE_OTHER): Payer: Medicare HMO | Admitting: Pulmonary Disease

## 2023-03-09 VITALS — BP 140/72 | HR 100 | Ht 62.5 in | Wt 191.0 lb

## 2023-03-09 DIAGNOSIS — J984 Other disorders of lung: Secondary | ICD-10-CM | POA: Diagnosis not present

## 2023-03-09 DIAGNOSIS — R0602 Shortness of breath: Secondary | ICD-10-CM

## 2023-03-09 NOTE — Patient Instructions (Addendum)
Your breathing tests show mild restrictive defect which could be due to your weight.   Recommend adding an extra work out class per week to 3 classes per week.  Recommend walking on the other days to increase your weekly physical activity  We will check your oxygen levels while walking today  We will consider a high resolution CT chest scan in the future if your Echocardiogram looks normal  Follow up in 3 months

## 2023-03-09 NOTE — Progress Notes (Signed)
Synopsis: Referred in February 2024 for shortness of breath by Thayer Ohm, PA  Subjective:   PATIENT ID: [name] GENDER: female DOB: May 03, 1941, MRN: [id number]  HPI  Chief Complaint  Patient presents with   Follow-up    F/U after PFT. States the SOB has gotten worse since last visit. Denies any increase in coughing or wheezing.    [name] is an 82 year old woman, former smoker with history adrenal insufficiency, bipolar disorder, GERD, hypertension, hyperlipidemia, obesity and hypothyroidism who returns to pulmonary clinic for shortness of breath.   She was trialed on advair 115-40mcg 2 puffs twice daily without any improvement in her breathing.   She feels her breathing has been worse these past few days. She has increased anxiety which can also lead to shortness of breath.   She is scheduled for echo on 4/11.   PFTs show mild restriction.   Home sleep study shows mild sleep apnea, AHI 5.1/hr with O2 nadir o 83%, avg SpO2 92%.   Initial OV 01/22/23 She developed shortness of breath over the past couple of weeks. She went to the ER 01/02/23. CTA was negative for pulmonary emboli and EKG did no show ischemic changes. Her troponins were flat. Blood pressure was 185/33mmHg. She continues to have exertional dyspnea with wheezing, cough and production of white mucous. She has had exertional dyspnea for along time, but feels her symptoms are different now. No history of wheezing. She denies any history of sleep apnea or waking up gasping.   She quit smoking 40 years ago. She smoked 2 packs per day for 14 years. She has history of second hand smoke exposure in childhood. No history of dust or chemical exposures. 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  Past Medical History:  Diagnosis Date   Adrenal insufficiency 07/25/2021   Anxiety    Arrhythmia    right bundle branch block   Bipolar 1 disorder    Cancer    right breast cancer   Depression     Dyspnea    with exertion   GERD (gastroesophageal reflux disease)    Headache    Heart murmur    Hyperlipidemia    Hypertension    Hypothyroidism    Morbid obesity    Osteoarthritis    Pneumonia    PONV (postoperative nausea and vomiting)    Schizo-affective schizophrenia    Thyroid disease    hypothyroidism     Family History  Problem Relation Age of Onset   Hypertension Mother    Lung cancer Brother    Schizophrenia Brother      Social History   Socioeconomic History   Marital status: Divorced    Spouse name: Not on file   Number of children: Not on file   Years of education: Not on file   Highest education level: Not on file  Occupational History   Not on file  Tobacco Use   Smoking status: Former    Types: Cigarettes    Quit date: 12/08/1978    Years since quitting: 44.2   Smokeless tobacco: Never  Vaping Use   Vaping Use: Never used  Substance and Sexual Activity   Alcohol use: Never   Drug use: No   Sexual activity: Not on file  Other Topics Concern   Not on file  Social History Narrative   Lives in [place] (has been there 10 years) - states in independent living / has a Marine scientist helps  her with meds      Degrees in English Lit and nursing      No caffeine   Right handed   One story      Social Determinants of Health   Financial Resource Strain: Not on file  Food Insecurity: No Food Insecurity (08/28/2022)   Hunger Vital Sign    Worried About Running Out of Food in the Last Year: Never true    Ran Out of Food in the Last Year: Never true  Transportation Needs: No Transportation Needs (08/28/2022)   PRAPARE - Hydrologist (Medical): No    Lack of Transportation (Non-Medical): No  Physical Activity: Not on file  Stress: Not on file  Social Connections: Not on file  Intimate Partner Violence: Not on file     Allergies  Allergen Reactions   Lithium Nausea Only   Fluoxetine Other (See Comments)    Pt felt crazy    Macrodantin Other (See Comments)    Unknown reaction   Mirabegron Other (See Comments)    ineffective   Nitrofurantoin Other (See Comments)    Other reaction(s): Did not agree with me   Paxil [Paroxetine] Other (See Comments)    Made pt feel crazy    Prednisone Other (See Comments)    Makes her feel very jittery   Prozac [Fluoxetine Hcl] Other (See Comments)    Pt felt crazy    Sertraline Hcl Other (See Comments)    Unknown reaction   Solifenacin Other (See Comments)    Ineffective    Sulfa Antibiotics Other (See Comments)    Unknown reaction   Wellbutrin [Bupropion Hcl] Other (See Comments)    Unknown reaction     Outpatient Medications Prior to Visit  Medication Sig Dispense Refill   acetaminophen (TYLENOL) 500 MG tablet Take 2 tablets (1,000 mg total) by mouth every 8 (eight) hours. 30 tablet 0   albuterol (VENTOLIN HFA) 108 (90 Base) MCG/ACT inhaler Inhale 2 puffs into the lungs every 6 (six) hours as needed for wheezing or shortness of breath. 8 g 2   amLODipine (NORVASC) 2.5 MG tablet Take 2.5 mg by mouth at bedtime.     Ascorbic Acid (VITAMIN C PO) Take 500 mg by mouth every morning.     atorvastatin (LIPITOR) 40 MG tablet Take 40 mg by mouth at bedtime.     Biotin 10 MG TABS Take 10,000 mcg by mouth every morning.     Calcium Carbonate Antacid (CALCIUM CARBONATE PO) Take 600 mg by mouth every morning.     Cholecalciferol (VITAMIN D) 50 MCG (2000 UT) tablet Take 2,000 Units by mouth daily.     clonazePAM (KLONOPIN) 1 MG tablet Take 1 mg by mouth at bedtime.     co-enzyme Q-10 30 MG capsule Take 30 mg by mouth daily.     Cyanocobalamin (VITAMIN B-12 PO) Take 2,500 mcg by mouth every morning.     docusate sodium (COLACE) 100 MG capsule Take 1 capsule (100 mg total) by mouth 2 (two) times daily. (Patient taking differently: Take 100 mg by mouth daily.) 10 capsule 0   gabapentin (NEURONTIN) 100 MG capsule Take 3 capsules (300 mg total) by mouth at bedtime. 90 capsule 5    hydrocortisone (CORTEF) 5 MG tablet Take 1 tablet (5 mg total) by mouth as directed. 2 tabs with breakfast, and 1 tablet in the afternoon 300 tablet 3   ibuprofen (ADVIL) 400 MG tablet Take 400 mg by mouth 3 (  three) times daily.     lamoTRIgine (LAMICTAL) 200 MG tablet Take 200 mg by mouth at bedtime.  12   levothyroxine (SYNTHROID) 137 MCG tablet Take 1 tablet (137 mcg total) by mouth daily before breakfast. 90 tablet 3   memantine (NAMENDA) 10 MG tablet Take 10 mg by mouth 2 (two) times daily.     methocarbamol (ROBAXIN) 500 MG tablet Take 1 tablet (500 mg total) by mouth every 6 (six) hours as needed for muscle spasms (muscle pain). 40 tablet 0   metoprolol tartrate (LOPRESSOR) 25 MG tablet Take 25 mg by mouth 2 (two) times daily.     Multiple Vitamin (MULTIVITAMIN WITH MINERALS) TABS tablet Take 1 tablet by mouth every morning.     OLANZapine (ZYPREXA) 15 MG tablet Take 30 mg by mouth at bedtime.     Omega-3 Fatty Acids (FISH OIL) 1200 MG CAPS Take 1,200 mg by mouth every morning.     omeprazole (PRILOSEC) 40 MG capsule Take 40 mg by mouth in the morning.     polyethylene glycol (MIRALAX / GLYCOLAX) 17 g packet Take 17 g by mouth daily as needed for mild constipation. 14 each 0   polyvinyl alcohol (LIQUIFILM TEARS) 1.4 % ophthalmic solution Place 1 drop into both eyes as needed for dry eyes.     ramelteon (ROZEREM) 8 MG tablet Take 8 mg by mouth at bedtime.     rosuvastatin (CRESTOR) 20 MG tablet Take 1 tablet (20 mg total) by mouth daily. Need labs from PCP 90 tablet 1   Spacer/Aero-Holding Chambers (AEROCHAMBER MV) inhaler Use as instructed 1 each 0   spironolactone (ALDACTONE) 25 MG tablet TAKE 1 TABLET (25 MG TOTAL) BY MOUTH DAILY. 90 tablet 0   traZODone (DESYREL) 50 MG tablet Take 150 mg by mouth at bedtime.     fluticasone-salmeterol (ADVAIR HFA) 115-21 MCG/ACT inhaler Inhale 2 puffs into the lungs 2 (two) times daily. 1 each 2   SAPHRIS 10 MG SUBL Take 10 mg by mouth daily at 6 (six)  AM.     No facility-administered medications prior to visit.   Review of Systems  Constitutional:  Negative for chills, fever, malaise/fatigue and weight loss.  HENT:  Negative for congestion, sinus pain and sore throat.   Eyes: Negative.   Respiratory:  Positive for shortness of breath. Negative for cough, hemoptysis, sputum production and wheezing.   Cardiovascular:  Negative for chest pain, palpitations, orthopnea, claudication and leg swelling.  Gastrointestinal:  Negative for abdominal pain, heartburn, nausea and vomiting.  Genitourinary: Negative.   Musculoskeletal:  Negative for joint pain and myalgias.  Skin:  Negative for rash.  Neurological:  Negative for weakness.  Endo/Heme/Allergies: Negative.   Psychiatric/Behavioral: Negative.     Objective:   Vitals:   03/09/23 0857  BP: (!) 140/72  Pulse: 100  SpO2: 96%  Weight: 191 lb (86.6 kg)  Height: 5' 2.5" (1.588 m)   Physical Exam Constitutional:      General: She is not in acute distress.    Appearance: She is obese. She is not ill-appearing.  HENT:     Head: Normocephalic and atraumatic.  Eyes:     Conjunctiva/sclera: Conjunctivae normal.  Cardiovascular:     Rate and Rhythm: Normal rate and regular rhythm.     Pulses: Normal pulses.     Heart sounds: Normal heart sounds. No murmur heard. Pulmonary:     Effort: Pulmonary effort is normal.     Breath sounds: Normal breath sounds. No wheezing,  rhonchi or rales.  Musculoskeletal:     Right lower leg: No edema.     Left lower leg: No edema.  Skin:    General: Skin is warm and dry.  Neurological:     General: No focal deficit present.     Mental Status: She is alert.    CBC    Component Value Date/Time   WBC 5.8 01/02/2023 1900   RBC 4.18 01/02/2023 1900   HGB 12.6 01/02/2023 1952   HGB 14.4 01/01/2021 1508   HGB 13.1 05/11/2014 0910   HCT 37.0 01/02/2023 1952   HCT 39.1 05/11/2014 0910   PLT 204 01/02/2023 1900   PLT 258 01/01/2021 1508   PLT 250  05/11/2014 0910   MCV 94.3 01/02/2023 1900   MCV 91.5 05/11/2014 0910   MCH 31.6 01/02/2023 1900   MCHC 33.5 01/02/2023 1900   RDW 14.6 01/02/2023 1900   RDW 14.0 05/11/2014 0910   LYMPHSABS 1.4 01/02/2023 1900   LYMPHSABS 2.4 05/11/2014 0910   MONOABS 0.7 01/02/2023 1900   MONOABS 0.5 05/11/2014 0910   EOSABS 0.0 01/02/2023 1900   EOSABS 0.1 05/11/2014 0910   BASOSABS 0.0 01/02/2023 1900   BASOSABS 0.0 05/11/2014 0910      Latest Ref Rng & Units 02/09/2023    9:33 AM 01/02/2023    7:52 PM 01/02/2023    7:00 PM  BMP  Glucose 70 - 99 mg/dL 103   121   BUN 6 - 23 mg/dL 27   22   Creatinine 0.40 - 1.20 mg/dL 0.85   0.88   Sodium 135 - 145 mEq/L 141  139  137   Potassium 3.5 - 5.1 mEq/L 4.8  4.3  4.5   Chloride 96 - 112 mEq/L 100   102   CO2 19 - 32 mEq/L 30   23   Calcium 8.4 - 10.5 mg/dL 10.3   9.5    Chest imaging: CTA Chest PE 01/03/23 Cardiovascular: Satisfactory opacification of the pulmonary arteries to the segmental level. No evidence of pulmonary embolism. Mild cardiomegaly. Coronary artery and aortic atherosclerotic calcification. No pericardial effusion.   Mediastinum/Nodes: Stable mild mediastinal adenopathy. For example a right paratracheal node measures 1.1 cm (8/84). Unremarkable esophagus.   Lungs/Pleura: Respiratory motion obscures fine detail. Expiratory phase exam. No focal consolidation, pleural effusion, or pneumothorax. Bibasilar atelectasis/scarring. No pleural effusion or pneumothorax.  PFT:    Latest Ref Rng & Units 03/06/2023    8:49 AM  PFT Results  FVC-Pre L 1.76  P  FVC-Predicted Pre % 75  P  FVC-Post L 1.86  P  FVC-Predicted Post % 79  P  Pre FEV1/FVC % % 87  P  Post FEV1/FCV % % 89  P  FEV1-Pre L 1.53  P  FEV1-Predicted Pre % 88  P  FEV1-Post L 1.65  P  DLCO uncorrected ml/min/mmHg 14.52  P  DLCO UNC% % 81  P  DLCO corrected ml/min/mmHg 14.52  P  DLCO COR %Predicted % 81  P  DLVA Predicted % 106  P  TLC L 3.28  P  TLC % Predicted  % 67  P  RV % Predicted % 50  P    P Preliminary result    Labs:  Path:  Echo 08/18/20: LV EF 70-75%. LV is hyperdynamic. RV sis is mildly enlarged with normal function.  Heart Catheterization:  Assessment & Plan:   Shortness of breath  Restrictive lung disease  Discussion: [name] is an 82 year  old woman, former smoker with history adrenal insufficiency, bipolar disorder, GERD, hypertension, hyperlipidemia, obesity and hypothyroidism who returns to pulmonary clinic for shortness of breath.   She did not have response to Advair inhaler trial. PFTs show mild restrictive defect which is likely due to her obesity. She has gained 25lbs in the past year. At this time her dyspnea appears related to weight gain and deconditioning. We are awaiting echocardiogram for further evaluation of her heart. She does not have significant sleep apnea contributing to her dyspnea.   If echo is unremarkable, we will consider getting a high resolution CT chest to rule out ILD. She denies diffuse joint pains or skin rashes.   Recommend increasing her physical activity level. She will go to her facilities exercise classes 3 days per week instead of 2 times per week. Encouraged her to walk on the days she does not go to exercise class.   Follow up in 3 months.   Freda Jackson, MD [facility] Office: [phone]    Current Outpatient Medications:    acetaminophen (TYLENOL) 500 MG tablet, Take 2 tablets (1,000 mg total) by mouth every 8 (eight) hours., Disp: 30 tablet, Rfl: 0   albuterol (VENTOLIN HFA) 108 (90 Base) MCG/ACT inhaler, Inhale 2 puffs into the lungs every 6 (six) hours as needed for wheezing or shortness of breath., Disp: 8 g, Rfl: 2   amLODipine (NORVASC) 2.5 MG tablet, Take 2.5 mg by mouth at bedtime., Disp: , Rfl:    Ascorbic Acid (VITAMIN C PO), Take 500 mg by mouth every morning., Disp: , Rfl:    atorvastatin (LIPITOR) 40 MG tablet, Take 40 mg by mouth at  bedtime., Disp: , Rfl:    Biotin 10 MG TABS, Take 10,000 mcg by mouth every morning., Disp: , Rfl:    Calcium Carbonate Antacid (CALCIUM CARBONATE PO), Take 600 mg by mouth every morning., Disp: , Rfl:    Cholecalciferol (VITAMIN D) 50 MCG (2000 UT) tablet, Take 2,000 Units by mouth daily., Disp: , Rfl:    clonazePAM (KLONOPIN) 1 MG tablet, Take 1 mg by mouth at bedtime., Disp: , Rfl:    co-enzyme Q-10 30 MG capsule, Take 30 mg by mouth daily., Disp: , Rfl:    Cyanocobalamin (VITAMIN B-12 PO), Take 2,500 mcg by mouth every morning., Disp: , Rfl:    docusate sodium (COLACE) 100 MG capsule, Take 1 capsule (100 mg total) by mouth 2 (two) times daily. (Patient taking differently: Take 100 mg by mouth daily.), Disp: 10 capsule, Rfl: 0   gabapentin (NEURONTIN) 100 MG capsule, Take 3 capsules (300 mg total) by mouth at bedtime., Disp: 90 capsule, Rfl: 5   hydrocortisone (CORTEF) 5 MG tablet, Take 1 tablet (5 mg total) by mouth as directed. 2 tabs with breakfast, and 1 tablet in the afternoon, Disp: 300 tablet, Rfl: 3   ibuprofen (ADVIL) 400 MG tablet, Take 400 mg by mouth 3 (three) times daily., Disp: , Rfl:    lamoTRIgine (LAMICTAL) 200 MG tablet, Take 200 mg by mouth at bedtime., Disp: , Rfl: 12   levothyroxine (SYNTHROID) 137 MCG tablet, Take 1 tablet (137 mcg total) by mouth daily before breakfast., Disp: 90 tablet, Rfl: 3   memantine (NAMENDA) 10 MG tablet, Take 10 mg by mouth 2 (two) times daily., Disp: , Rfl:    methocarbamol (ROBAXIN) 500 MG tablet, Take 1 tablet (500 mg total) by mouth every 6 (six) hours as needed for muscle spasms (muscle pain)., Disp: 40 tablet,  Rfl: 0   metoprolol tartrate (LOPRESSOR) 25 MG tablet, Take 25 mg by mouth 2 (two) times daily., Disp: , Rfl:    Multiple Vitamin (MULTIVITAMIN WITH MINERALS) TABS tablet, Take 1 tablet by mouth every morning., Disp: , Rfl:    OLANZapine (ZYPREXA) 15 MG tablet, Take 30 mg by mouth at bedtime., Disp: , Rfl:    Omega-3 Fatty Acids (FISH  OIL) 1200 MG CAPS, Take 1,200 mg by mouth every morning., Disp: , Rfl:    omeprazole (PRILOSEC) 40 MG capsule, Take 40 mg by mouth in the morning., Disp: , Rfl:    polyethylene glycol (MIRALAX / GLYCOLAX) 17 g packet, Take 17 g by mouth daily as needed for mild constipation., Disp: 14 each, Rfl: 0   polyvinyl alcohol (LIQUIFILM TEARS) 1.4 % ophthalmic solution, Place 1 drop into both eyes as needed for dry eyes., Disp: , Rfl:    ramelteon (ROZEREM) 8 MG tablet, Take 8 mg by mouth at bedtime., Disp: , Rfl:    rosuvastatin (CRESTOR) 20 MG tablet, Take 1 tablet (20 mg total) by mouth daily. Need labs from PCP, Disp: 90 tablet, Rfl: 1   Spacer/Aero-Holding Chambers (AEROCHAMBER MV) inhaler, Use as instructed, Disp: 1 each, Rfl: 0   spironolactone (ALDACTONE) 25 MG tablet, TAKE 1 TABLET (25 MG TOTAL) BY MOUTH DAILY., Disp: 90 tablet, Rfl: 0   traZODone (DESYREL) 50 MG tablet, Take 150 mg by mouth at bedtime., Disp: , Rfl:

## 2023-03-12 ENCOUNTER — Encounter (HOSPITAL_BASED_OUTPATIENT_CLINIC_OR_DEPARTMENT_OTHER): Payer: Self-pay

## 2023-03-19 ENCOUNTER — Ambulatory Visit (INDEPENDENT_AMBULATORY_CARE_PROVIDER_SITE_OTHER): Payer: Medicare HMO

## 2023-03-19 ENCOUNTER — Ambulatory Visit (INDEPENDENT_AMBULATORY_CARE_PROVIDER_SITE_OTHER): Payer: Medicare HMO | Admitting: Family

## 2023-03-19 ENCOUNTER — Encounter (HOSPITAL_BASED_OUTPATIENT_CLINIC_OR_DEPARTMENT_OTHER): Payer: Self-pay | Admitting: Family

## 2023-03-19 VITALS — BP 124/73 | HR 71 | Ht 62.5 in | Wt 188.0 lb

## 2023-03-19 DIAGNOSIS — E785 Hyperlipidemia, unspecified: Secondary | ICD-10-CM

## 2023-03-19 DIAGNOSIS — I5032 Chronic diastolic (congestive) heart failure: Secondary | ICD-10-CM

## 2023-03-19 DIAGNOSIS — R0602 Shortness of breath: Secondary | ICD-10-CM

## 2023-03-19 DIAGNOSIS — I2510 Atherosclerotic heart disease of native coronary artery without angina pectoris: Secondary | ICD-10-CM

## 2023-03-19 DIAGNOSIS — E039 Hypothyroidism, unspecified: Secondary | ICD-10-CM | POA: Diagnosis not present

## 2023-03-19 DIAGNOSIS — E2740 Unspecified adrenocortical insufficiency: Secondary | ICD-10-CM | POA: Diagnosis not present

## 2023-03-19 DIAGNOSIS — R0609 Other forms of dyspnea: Secondary | ICD-10-CM | POA: Diagnosis not present

## 2023-03-19 DIAGNOSIS — I10 Essential (primary) hypertension: Secondary | ICD-10-CM

## 2023-03-19 DIAGNOSIS — R110 Nausea: Secondary | ICD-10-CM | POA: Diagnosis not present

## 2023-03-19 LAB — ECHOCARDIOGRAM COMPLETE
Area-P 1/2: 3.37 cm2
Height: 62.5 in
MV M vel: 5.39 m/s
MV Peak grad: 116.2 mmHg
MV VTI: 1.91 cm2
P 1/2 time: 413 msec
S' Lateral: 2.4 cm
Weight: 3008 oz

## 2023-03-19 MED ORDER — PROMETHAZINE HCL 12.5 MG PO TABS: 12.5000 mg | ORAL_TABLET | Freq: Four times a day (QID) | ORAL | 0 refills | Status: AC | PRN

## 2023-03-19 NOTE — Patient Instructions (Signed)
Medication Instructions:  Your physician recommends that you continue on your current medications as directed. Please refer to the Current Medication list given to you today.   Follow-Up: 3-4 months with Dr. Duke Salvia or Gillian Shields, NP   Special Instructions:  Use the hypertension book given to you today to record blood pressures 2-3 times per week.

## 2023-03-19 NOTE — Progress Notes (Signed)
Office Visit    Patient Name: [name] Date of Encounter: 03/19/2023  PCP:  Thana Atesaju, Sneha P, MD   [facility]  Cardiologist:  Chilton Siiffany [place], MD  Advanced Practice Provider:  No care team member to display Electrophysiologist:  None      Chief Complaint    [name] is a 82 y.o. female presents today for hypertension follow up   Past Medical History    Past Medical History:  Diagnosis Date   Adrenal insufficiency 07/25/2021   Anxiety    Arrhythmia    right bundle branch block   Bipolar 1 disorder    Cancer    right breast cancer   Depression    Dyspnea    with exertion   GERD (gastroesophageal reflux disease)    Headache    Heart murmur    Hyperlipidemia    Hypertension    Hypothyroidism    Morbid obesity    Osteoarthritis    Pneumonia    PONV (postoperative nausea and vomiting)    Schizo-affective schizophrenia    Thyroid disease    hypothyroidism   Past Surgical History:  Procedure Laterality Date   BREAST SURGERY     CHOLECYSTECTOMY     DILATION AND CURETTAGE OF UTERUS     ESOPHAGEAL MANOMETRY N/A 01/29/2018   Procedure: ESOPHAGEAL MANOMETRY (EM);  Surgeon: Graylin ShiverGanem, Salem F, MD;  Location: [facility];  Service: Endoscopy;  Laterality: N/A;   HAND SURGERY     Right-trigger finger   IR THORACENTESIS ASP PLEURAL SPACE W/IMG GUIDE  06/28/2018   IR THORACENTESIS ASP PLEURAL SPACE W/IMG GUIDE  07/19/2018   KNEE SURGERY     Left   TONSILLECTOMY     TOTAL KNEE ARTHROPLASTY Left 07/08/2022   Procedure: TOTAL KNEE ARTHROPLASTY;  Surgeon: Durene Romanslin, Matthew, MD;  Location: [facility];  Service: Orthopedics;  Laterality: Left;    Allergies  Allergies  Allergen Reactions   Lithium Nausea Only   Fluoxetine Other (See Comments)    Pt felt crazy   Macrodantin Other (See Comments)    Unknown reaction   Mirabegron Other (See Comments)    ineffective   Nitrofurantoin Other (See Comments)    Other reaction(s): Did not agree with me    Paxil [Paroxetine] Other (See Comments)    Made pt feel crazy    Prednisone Other (See Comments)    Makes her feel very jittery   Prozac [Fluoxetine Hcl] Other (See Comments)    Pt felt crazy    Sertraline Hcl Other (See Comments)    Unknown reaction   Solifenacin Other (See Comments)    Ineffective    Sulfa Antibiotics Other (See Comments)    Unknown reaction   Wellbutrin [Bupropion Hcl] Other (See Comments)    Unknown reaction    History of Present Illness    [name] is a 82 y.o. female with a hx of hypertension, HLD, chronic shortness of breath, chronic diastolic heart failure, morbid obesity, prior breast cancer, Alzheimer's disease, schizo-affective disorder and bipolar disorder last seen 07/25/21 by Dr. Duke Salviaandolph. Previous patient of Dr. Patty SermonsBrackbill.  Echo 04/2008 normal LVEF, mild AS, trace MR/TR. Myoview 05/2014 negative for ischemia.  Admitted 08/2020 with fall and fevere. Chest CT with coronary calcifications. 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 Symptoms felt to be due to demand ischemia. Myoview 09/2020 normal LVEF 60%, no perfusion deficits. In outpatient setting Crestor added. Concern for apical hypertrophy but as not  ICD candidate and asymtpomatic, no further testing performed. 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   Last saw Dr. Duke Salvia 07/25/21. BP was well controlled, hair growth improved with hydrocortisone for adrenal insufficiency felt to be due to frequent steroid injections.   ED visit 01/02/23 with dyspnea. CT negative for PE, troponin flat in the 60s, with symptoms improved and discharged home. She saw Dr. Francine Graven 03/09/23 with PFTs revealing mild restrictive defect. She was recommended for echocardiogram, sleep study, and consideration of CT if workup unremarkable.  Presents today for follow up with her friend [name]. Lives at [facility] and participates in exercise classes 3 times per week. BP at home  120s-130s. Ntes since early January has had difficulty breathing with exertion. She has adjusted her diet significantly to help lose weight. She has lost 3 pounds in the last week by focusing on more fresh fruits and vegetables. Since adjusted dose of thyroid medication her night sweats have resolved. Does note difficulties with nausea.   Echo performed after clinic visit normal LVEF 60-65%, severe concentric LVH, gr2DD, RV dilated, mildly elevated PASP (RVSP 39 mmHg), mild MR/AI. Slight increase in RSVP compared to prior.   EKGs/Labs/Other Studies Reviewed:   The following studies were reviewed today: Cardiac Studies & Procedures     STRESS TESTS  MYOCARDIAL PERFUSION IMAGING 09/07/2020  Narrative  Nuclear stress EF: 60%.  There was no ST segment deviation noted during stress.  The study is normal.  This is a low risk study.  The left ventricular ejection fraction is normal (55-65%).  Normal stress nuclear study with no ischemia or infarction; EF 60 with normal wall motion.   ECHOCARDIOGRAM  ECHOCARDIOGRAM LIMITED 08/18/2020  Narrative ECHOCARDIOGRAM LIMITED REPORT    Patient Name:   [name] Date of Exam: 08/18/2020 Medical Rec #:  [id number]     Height:       64.0 in Accession #:    [id number]    Weight:       159.4 lb Date of Birth:  1941-10-17      BSA:          1.777 m Patient Age:    79 years      BP:           127/46 mmHg Patient Gender: F             HR:           76 bpm. Exam Location:  Inpatient  Procedure: Limited Color Doppler and Intracardiac Opacification Agent  Indications:    elevated troponin  History:        Patient has prior history of Echocardiogram examinations.  Sonographer:    Delcie Roch Referring Phys: [phone] TRACI R TURNER  IMPRESSIONS   1. Left ventricular ejection fraction, by estimation, is 70 to 75%. The left ventricle has hyperdynamic function. The left ventricle has no regional wall motion abnormalities. 2. Right  ventricular systolic function is normal. The right ventricular size is mildly enlarged. 3. Limited study with use of Definity echocontrast, LVEF is hyperdynamic. There is significant eccentric LVH predominantly in the apical segments. A cardiac MRI is recommended to evaluate for apical form of hypertrophic cardiomyopathy.  FINDINGS Left Ventricle: Left ventricular ejection fraction, by estimation, is 70 to 75%. The left ventricle has hyperdynamic function. The left ventricle has no regional wall motion abnormalities. Definity contrast agent was given IV to delineate the left ventricular endocardial borders. The left ventricular internal cavity size was normal  in size. There is no left ventricular hypertrophy.  Right Ventricle: The right ventricular size is mildly enlarged. No increase in right ventricular wall thickness. Right ventricular systolic function is normal.    Tobias Alexander MD Electronically signed by Tobias Alexander MD Signature Date/Time: 08/18/2020/12:54:13 PM    Final              EKG:  EKG is not ordered today.    Recent Labs: 01/02/2023: ALT 25; B Natriuretic Peptide 72.8; Hemoglobin 12.6; Magnesium 2.0; Platelets 204 02/09/2023: BUN 27; Creatinine, Ser 0.85; Potassium 4.8; Sodium 141; TSH 6.88  Recent Lipid Panel    Component Value Date/Time   CHOL 187 10/10/2015 0933   TRIG 138 08/17/2020 1135   HDL 36 (L) 10/10/2015 0933   CHOLHDL 5.2 (H) 10/10/2015 0933   VLDL 63 (H) 10/10/2015 0933   LDLCALC 88 10/10/2015 0933   LDLDIRECT 118.0 11/09/2014 0853     Home Medications   Current Meds  Medication Sig   acetaminophen (TYLENOL) 500 MG tablet Take 2 tablets (1,000 mg total) by mouth every 8 (eight) hours. (Patient taking differently: Take 1,000 mg by mouth every 12 (twelve) hours as needed.)   amLODipine (NORVASC) 2.5 MG tablet Take 2.5 mg by mouth at bedtime.   Ascorbic Acid (VITAMIN C PO) Take 500 mg by mouth every morning.   Biotin 10 MG TABS Take 10,000 mcg  by mouth every morning.   Calcium Carbonate Antacid (CALCIUM CARBONATE PO) Take 600 mg by mouth every morning.   Cholecalciferol (VITAMIN D) 50 MCG (2000 UT) tablet Take 2,000 Units by mouth daily.   clonazePAM (KLONOPIN) 1 MG tablet Take 1 mg by mouth at bedtime.   co-enzyme Q-10 30 MG capsule Take 30 mg by mouth daily.   Cyanocobalamin (VITAMIN B-12 PO) Take 2,500 mcg by mouth every morning.   docusate sodium (COLACE) 100 MG capsule Take 1 capsule (100 mg total) by mouth 2 (two) times daily. (Patient taking differently: Take 100 mg by mouth daily as needed.)   gabapentin (NEURONTIN) 100 MG capsule Take 3 capsules (300 mg total) by mouth at bedtime.   hydrocortisone (CORTEF) 5 MG tablet Take 1 tablet (5 mg total) by mouth as directed. 2 tabs with breakfast, and 1 tablet in the afternoon   ibuprofen (ADVIL) 400 MG tablet Take 400 mg by mouth every 8 (eight) hours as needed.   lamoTRIgine (LAMICTAL) 200 MG tablet Take 200 mg by mouth at bedtime.   levothyroxine (SYNTHROID) 137 MCG tablet Take 1 tablet (137 mcg total) by mouth daily before breakfast.   memantine (NAMENDA) 10 MG tablet Take 10 mg by mouth 2 (two) times daily.   metoprolol tartrate (LOPRESSOR) 25 MG tablet Take 25 mg by mouth 2 (two) times daily.   Multiple Vitamin (MULTIVITAMIN WITH MINERALS) TABS tablet Take 1 tablet by mouth every morning.   OLANZapine (ZYPREXA) 15 MG tablet Take 30 mg by mouth at bedtime.   Omega-3 Fatty Acids (FISH OIL) 1200 MG CAPS Take 1,200 mg by mouth every morning.   omeprazole (PRILOSEC) 40 MG capsule Take 40 mg by mouth in the morning.   polyethylene glycol (MIRALAX / GLYCOLAX) 17 g packet Take 17 g by mouth daily as needed for mild constipation.   polyvinyl alcohol (LIQUIFILM TEARS) 1.4 % ophthalmic solution Place 1 drop into both eyes as needed for dry eyes.   promethazine (PHENERGAN) 12.5 MG tablet Take 1 tablet (12.5 mg total) by mouth every 6 (six) hours as needed for nausea  or vomiting.   ramelteon  (ROZEREM) 8 MG tablet Take 8 mg by mouth at bedtime.   spironolactone (ALDACTONE) 25 MG tablet TAKE 1 TABLET (25 MG TOTAL) BY MOUTH DAILY.   traZODone (DESYREL) 50 MG tablet Take 150 mg by mouth at bedtime.     Review of Systems      All other systems reviewed and are otherwise negative except as noted above.  Physical Exam    VS:  BP 124/73 Comment: right arm  Pulse 71   Ht 5' 2.5" (1.588 m)   Wt 188 lb (85.3 kg)   BMI 33.84 kg/m  , BMI Body mass index is 33.84 kg/m.  Wt Readings from Last 3 Encounters:  03/19/23 188 lb (85.3 kg)  03/09/23 191 lb (86.6 kg)  02/09/23 190 lb (86.2 kg)     GEN: Well nourished, overweight, well developed, in no acute distress. HEENT: normal. Neck: Supple, no JVD, carotid bruits, or masses. Cardiac: RRR, no murmurs, rubs, or gallops. No clubbing, cyanosis.  Bilateral nonpitting lower extremity edema.   Radials/PT 2+ and equal bilaterally.  Respiratory:  Respirations regular and unlabored, clear to auscultation bilaterally. GI: Soft, nontender, nondistended. MS: No deformity or atrophy. Skin: Warm and dry, no rash.  Varicose veins bilateral lower extremities. Neuro:  Strength and sensation are intact. Psych: Normal affect.  Assessment & Plan    DOE- Multifactorial obesity, deconditioning, diastolic heart failure, restrictive lung disease. Follows with Dr. Francine Graven of pulmonology. Heart failure management, as below.   HTN- BP well controlled. Continue current antihypertensive regimen Amlodipine 2.5mg  QD, Lopressor  BID, Spironolactone  QD.  BP at home 120s-130s. Educated to report BP consistently >130/80. Discussed to monitor BP at home at least 2 hours after medications and sitting for 5-10 minutes. If elevated in future could increase dose of Amlodipine.  Hypothyroidism - Night sweats and fatigue improved since adjusted dose of Levothyroxine. Follows with endocrinology.  Diastolic heart failure - Worsening dyspnea since 12/2022. ED visit  01/02/23 normal BNP. Exertional dyspnea since that time. LE edema stable at baseline. Echo after visit normal LVEF, known severe LVH, gr2DD, mildly elevated PASP, RA pressure . Will reach out to Dr. Francine Graven to discuss - low suspicion volume status unchanged from baseline but could trial Furosemide  QD to see if there is improvement in symptoms.  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 Will defer further evaluation. Continue Metoprolol.  Adrenal insufficiency - Follows with endocrinology.   Nausea - Rx Phenergan 12.5mg  PRN for nausea. Further follow up per primary care.   Coronary calcification on CT / HLD - Stable with no anginal symptoms. No indication for ischemic evaluation.  Continue Rosuvastatin.          Disposition: Follow up  in 3- 4 months  with Chilton Si, MD or APP.  Signed, Alver Sorrow, NP 03/19/2023, 1:13 PM [facility]

## 2023-03-20 ENCOUNTER — Telehealth: Payer: Self-pay | Admitting: Family

## 2023-03-20 ENCOUNTER — Telehealth (HOSPITAL_BASED_OUTPATIENT_CLINIC_OR_DEPARTMENT_OTHER): Payer: Self-pay

## 2023-03-20 MED ORDER — ATORVASTATIN CALCIUM 40 MG PO TABS: 40.0000 mg | ORAL_TABLET | Freq: Every day | ORAL | 3 refills | Status: AC

## 2023-03-20 NOTE — Telephone Encounter (Signed)
Patient is requesting call back in regards to echo.

## 2023-03-20 NOTE — Telephone Encounter (Signed)
Returned call to patient and reminded her that results will be called to her when they are ready.

## 2023-03-20 NOTE — Telephone Encounter (Addendum)
Returned call to patient and let her know that Atorvastatin 40mg  is the correct medication and dose. Refills sent to pharmacy and medication list updated. Assured patient I would call her with her echo results when we got them. She states it is okay to leave a message on her answering machine as she is headed to a workout class.    ----- Message from Alver Sorrow, NP sent at 03/19/2023  8:10 PM EDT ----- Thank you! Okay to continue Atorvastatin. Can you just update her med list and call her to let her know we made sure it was correct? Thanks so much! ----- Message ----- From: Marlene Lard, RN Sent: 03/19/2023   2:19 PM EDT To: Alver Sorrow, NP  Last picked up Atorv 40mg  30 day supply from [facility] on 3/28. They do not have rosu 20 on file for her.

## 2023-03-23 NOTE — Telephone Encounter (Signed)
Returned call to patient to let her know Dr. Duke Salvia is rounding in the hospital, and may not be able to read her ECHO until next week.  Pt verbalized understanding.  Jim Like MHA RN CCM

## 2023-03-23 NOTE — Telephone Encounter (Signed)
Patient is returning call to see if there is an update on the results of the echo. Requesting return call.

## 2023-03-26 ENCOUNTER — Telehealth (HOSPITAL_BASED_OUTPATIENT_CLINIC_OR_DEPARTMENT_OTHER): Payer: Self-pay | Admitting: Family

## 2023-03-26 DIAGNOSIS — R0609 Other forms of dyspnea: Secondary | ICD-10-CM

## 2023-03-26 DIAGNOSIS — I5032 Chronic diastolic (congestive) heart failure: Secondary | ICD-10-CM

## 2023-03-26 MED ORDER — FUROSEMIDE 20 MG PO TABS: 20.0000 mg | ORAL_TABLET | Freq: Every day | ORAL | 0 refills | Status: DC

## 2023-03-26 NOTE — Telephone Encounter (Signed)
Discussed with Dr. Francine Graven of pulmonology.  She follows with cardiology and pulmonology regarding her dyspnea.  Echocardiogram 03/19/23 revealed diastolic dysfunction and mildly elevated pulmonary artery pressure.  This can contribute to dyspnea (shortness of breath).   Recommend trial of furosemide (Lasix) 20 mg daily with BMP in 1 week for monitoring.  Please provide 30-day supply with no refills.  This is a low-dose of a fluid pill helps her to reduce fluid and reduce workload on the lungs and therefore improves shortness of breath.  We will then plan to call her in a week or two to check in on whether this improves her symptoms.  Will ask nursing team to call her with these recommendations.   Alver Sorrow, NP

## 2023-03-26 NOTE — Telephone Encounter (Signed)
Returned call to patient and informed her of the following recommendations. Patient verbalizes understanding.     "Yes, please. Continue Spironolactone. Add Furosemide.   Alver Sorrow, NP"

## 2023-03-26 NOTE — Telephone Encounter (Signed)
Yes, please. Continue Spironolactone. Add Furosemide.  Alver Sorrow, NP

## 2023-03-26 NOTE — Addendum Note (Signed)
Addended by: Marlene Lard on: 03/26/2023 01:52 PM   Modules accepted: Orders

## 2023-03-26 NOTE — Telephone Encounter (Signed)
Called patient and provided the following recommendations, patient is agreeable to new medication and labs. She does state she is currently on spirolactone and wants to know if she should continue that with the lasix. Rx to pharm and labs mailed to patient.      "Discussed with Dr. Francine Graven of pulmonology.  She follows with cardiology and pulmonology regarding her dyspnea.  Echocardiogram 03/19/23 revealed diastolic dysfunction and mildly elevated pulmonary artery pressure.  This can contribute to dyspnea (shortness of breath).    Recommend trial of furosemide (Lasix) 20 mg daily with BMP in 1 week for monitoring.  Please provide 30-day supply with no refills.  This is a low-dose of a fluid pill helps her to reduce fluid and reduce workload on the lungs and therefore improves shortness of breath.   We will then plan to call her in a week or two to check in on whether this improves her symptoms.   Will ask nursing team to call her with these recommendations.    Alver Sorrow, NP"

## 2023-04-06 ENCOUNTER — Telehealth (HOSPITAL_BASED_OUTPATIENT_CLINIC_OR_DEPARTMENT_OTHER): Payer: Self-pay | Admitting: *Deleted

## 2023-04-06 DIAGNOSIS — I5032 Chronic diastolic (congestive) heart failure: Secondary | ICD-10-CM | POA: Diagnosis not present

## 2023-04-06 DIAGNOSIS — R0609 Other forms of dyspnea: Secondary | ICD-10-CM | POA: Diagnosis not present

## 2023-04-06 LAB — BASIC METABOLIC PANEL
BUN/Creatinine Ratio: 18 (ref 12–28)
BUN: 12 mg/dL (ref 8–27)
CO2: 23 mmol/L (ref 20–29)
Calcium: 10.4 mg/dL — ABNORMAL HIGH (ref 8.7–10.3)
Chloride: 101 mmol/L (ref 96–106)
Creatinine, Ser: 0.67 mg/dL (ref 0.57–1.00)
Glucose: 143 mg/dL — ABNORMAL HIGH (ref 70–99)
Potassium: 4.7 mmol/L (ref 3.5–5.2)
Sodium: 141 mmol/L (ref 134–144)
eGFR: 87 mL/min/{1.73_m2} (ref >59)

## 2023-04-06 NOTE — Telephone Encounter (Signed)
Patient came to office today to weigh, was in building getting lab work Weight was 188.3 today, was 188 at last visit Has been on Lasix 20 mg for 10 days and has been eating better Denies much of an increase in urinary output Per patient not much of a change in the way she is feeling, about the same but no worse.  Unsure if any swelling   Will forward to Ronn Melena NP for review

## 2023-04-07 NOTE — Telephone Encounter (Signed)
Results called to patient who verbalizes understanding!   Labs resulted as follows, "Kidney function and potassium.  Good result!  Would recommend we give Lasix at least 2 to 3 weeks to see if it makes any improvement in her dyspnea.  Continue Lasix 20 mg daily. "   Nursing team will call with results.    [name] results are in your basket   Alver Sorrow, NP

## 2023-04-07 NOTE — Telephone Encounter (Signed)
Labs resulted as follows, "Kidney function and potassium.  Good result!  Would recommend we give Lasix at least 2 to 3 weeks to see if it makes any improvement in her dyspnea.  Continue Lasix 20 mg daily. "  Nursing team will call with results.   [name] results are in your basket  Alver Sorrow, NP

## 2023-04-09 ENCOUNTER — Telehealth (HOSPITAL_BASED_OUTPATIENT_CLINIC_OR_DEPARTMENT_OTHER): Payer: Self-pay

## 2023-04-09 DIAGNOSIS — R0609 Other forms of dyspnea: Secondary | ICD-10-CM

## 2023-04-09 DIAGNOSIS — I5032 Chronic diastolic (congestive) heart failure: Secondary | ICD-10-CM

## 2023-04-09 DIAGNOSIS — I10 Essential (primary) hypertension: Secondary | ICD-10-CM

## 2023-04-09 NOTE — Telephone Encounter (Signed)
Appreciate update.  As discussed on lab results this week we will continue present dose of Lasix for another 2 to 3 weeks to assess impact on dyspnea.  Alver Sorrow, NP

## 2023-04-09 NOTE — Telephone Encounter (Addendum)
Called patient to check on shortness of breath, she states the shortness of breath is about the same.    ----- Message from Marlene Lard, RN sent at 03/26/2023  1:49 PM EDT ----- Check on shortness of breath

## 2023-04-13 NOTE — Telephone Encounter (Signed)
Please advise 

## 2023-04-13 NOTE — Telephone Encounter (Signed)
Provider out of office, should patient continue increased lasix dosing?

## 2023-04-13 NOTE — Telephone Encounter (Signed)
Pharmacy has not seen pt before, hard to comment on her loop dosing without evaluating her and evaluating SOB is out of our scope, is there a covering APP while Luther Parody is off?

## 2023-04-13 NOTE — Telephone Encounter (Signed)
Pt c/o medication issue:  1. Name of Medication:   furosemide (LASIX) 20 MG tablet   2. How are you currently taking this medication (dosage and times per day)?   40 mg per day  3. Are you having a reaction (difficulty breathing--STAT)?   No  4. What is your medication issue?    Patient stated she had not been losing the fluid and yesterday and today she took 40 mg instead of 20 and she has been able to lose fluid.  Patient wants to know if she should continue taking this increased dose.  Patient stated her recent blood work showed her kidneys and potassium have been good.  Patient stated she is still having the same SOB.

## 2023-04-14 NOTE — Addendum Note (Signed)
Addended by: Marlene Lard on: 04/14/2023 08:24 AM   Modules accepted: Orders

## 2023-04-14 NOTE — Telephone Encounter (Signed)
Returned call to patient,   Reviewed the following information with the patient who verbalizes understanding. She will complete labs early next week.    Consulted with Dr. Cristal Deer, okay to continue 40mg  lasix for 2 more days then return to 20mg  and have repeat BMP done.

## 2023-04-14 NOTE — Telephone Encounter (Signed)
Patient called again wanting to know if she should continue taking 40 mg dosage of furosemide (LASIX) 20 MG tablet.  Patient stated she will be away from her phone between 10:00 am and 1:00 pm.

## 2023-04-14 NOTE — Telephone Encounter (Signed)
Previously routed, awaiting review, will also print for DOD review.

## 2023-04-15 ENCOUNTER — Other Ambulatory Visit (HOSPITAL_BASED_OUTPATIENT_CLINIC_OR_DEPARTMENT_OTHER): Payer: Self-pay | Admitting: Family

## 2023-04-15 DIAGNOSIS — R0609 Other forms of dyspnea: Secondary | ICD-10-CM

## 2023-04-15 DIAGNOSIS — I5032 Chronic diastolic (congestive) heart failure: Secondary | ICD-10-CM

## 2023-04-21 DIAGNOSIS — D0512 Intraductal carcinoma in situ of left breast: Secondary | ICD-10-CM | POA: Diagnosis not present

## 2023-04-21 DIAGNOSIS — Z853 Personal history of malignant neoplasm of breast: Secondary | ICD-10-CM | POA: Diagnosis not present

## 2023-04-24 DIAGNOSIS — M25511 Pain in right shoulder: Secondary | ICD-10-CM | POA: Diagnosis not present

## 2023-04-28 DIAGNOSIS — R0609 Other forms of dyspnea: Secondary | ICD-10-CM | POA: Diagnosis not present

## 2023-04-28 DIAGNOSIS — I5032 Chronic diastolic (congestive) heart failure: Secondary | ICD-10-CM | POA: Diagnosis not present

## 2023-04-28 DIAGNOSIS — I10 Essential (primary) hypertension: Secondary | ICD-10-CM | POA: Diagnosis not present

## 2023-04-28 LAB — BASIC METABOLIC PANEL
BUN/Creatinine Ratio: 25 (ref 12–28)
BUN: 20 mg/dL (ref 8–27)
CO2: 24 mmol/L (ref 20–29)
Calcium: 10.2 mg/dL (ref 8.7–10.3)
Chloride: 100 mmol/L (ref 96–106)
Creatinine, Ser: 0.81 mg/dL (ref 0.57–1.00)
Glucose: 124 mg/dL — ABNORMAL HIGH (ref 70–99)
Potassium: 4.7 mmol/L (ref 3.5–5.2)
Sodium: 138 mmol/L (ref 134–144)
eGFR: 72 mL/min/{1.73_m2} (ref >59)

## 2023-05-01 ENCOUNTER — Inpatient Hospital Stay: Payer: Medicare HMO | Attending: Hematology and Oncology | Admitting: Hematology and Oncology

## 2023-05-01 ENCOUNTER — Telehealth: Payer: Self-pay | Admitting: Neurology

## 2023-05-01 DIAGNOSIS — Z170 Estrogen receptor positive status [ER+]: Secondary | ICD-10-CM

## 2023-05-01 DIAGNOSIS — C50811 Malignant neoplasm of overlapping sites of right female breast: Secondary | ICD-10-CM | POA: Diagnosis not present

## 2023-05-01 NOTE — Progress Notes (Signed)
[facility]  Telephone:([phone]) [phone] [phone]    ID: [name] DOB: Dec 26, 1982  MR#: [id number]  JYN#:[id number]  Patient Care Team: Thana Ates, MD as PCP - General (Internal Medicine) Chilton Si, MD as PCP - Cardiology (Cardiology) Drema Dallas, DO as Consulting Physician (Neurology) Silverio Lay, MD as Consulting Physician (Obstetrics and Gynecology) Magrinat, Valentino Hue, MD (Inactive) as Consulting Physician (Oncology) Chilton Si, MD as Attending Physician (Cardiology) Graylin Shiver, MD as Consulting Physician (Gastroenterology) Milagros Evener, MD as Consulting Physician (Psychiatry) Ranee Gosselin, MD as Consulting Physician (Orthopedic Surgery) Venancio Poisson, MD as Consulting Physician (Dermatology) Jodene Nam, RT (Inactive) as Technician (Radiology) Rachel Moulds, MD OTHER MD:  CHIEF COMPLAINT: noninvasive breast cancer, estrogen receptor positive  CURRENT TREATMENT: Observation  INTERVAL HISTORY:  Zafira was contacted today for follow up of her noninvasive breast cancer.  She opted for observation alone (no surgery, radiation or anti-estrogens). Since her last visit, she underwent mammogram in May, stable calcs in the left breast. She is more active, doing exercise and walking regularly. Repeat mammogram in Nov 2024 which according to the patient is already scheduled.   Rest of the pertinent 10 point ROS reviewed and negative    COVID 19 VACCINATION STATUS: Moderna x2 with booster in July.   RIGHT BREAST CANCER HISTORY: From the original intake note:   Ms. Connaway had routine screening mammography at the [facility] on September 06, 2009. There was a possible abnormality in the upper outer quadrant of the right breast so she was brought back for diagnostic studies September 12, 2009.  Magnification views confirmed a 1.4 spiculated mass in the upper outer quadrant of the right breast with a few  microcalcifications associated with it.  By ultrasound, this measured 1.5 cm and was highly suggestive of malignancy.  Biopsy was performed the same day and showed ([id number] [phone]) an invasive lobular carcinoma which was 92% ER and 44% PR positive.  The proliferation marker was 13%.  The tumor did not overexpress Her-2 with a ratio of 1.31.     With this information, the patient was referred to Dr. Luisa Hart and after appropriate discussion, she underwent definitive right lumpectomy and axillary sentinel lymph node sampling October 17, 2009.  The final pathology there ([id number]) showed a 1.5 cm invasive lobular carcinoma, grade 1, with negative though closed margins (the in situ component was at 1 mm from the superior, inferior and medial margins) with no evidence of lymphovascular invasion and the single sentinel lymph node clear.  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     HISTORY OF CURRENT ILLNESS: Priyanka Gordley Crossin has a history of right breast invasive lobular carcinoma, for which she underwent lumpectomy and radiation in 10/2009.  She did not receive chemo and she did not tolerate antiestrogens.  I followed the patient, and she was subsequently released from follow up in 05/2014. See full history below.  More recently, she had routine screening mammography on 11/29/2020 showing a possible abnormality in the left breast. She underwent left diagnostic mammography with tomography at [facility] on 12/06/2020 showing: breast density category B; 1 cm grouped fine punctate calcifications in upper-outer left breast.  Accordingly on 12/20/2020 she proceeded to biopsy of the left breast area in question.  Post procedure mammography showed the clip at the targeted area.  The pathology from this procedure ([id number]) showed: ductal carcinoma in situ, low grade, with calcifications. Prognostic indicators significant for: estrogen receptor, >95% positive with strong  staining intensity and progesterone receptor,  90% positive with moderate-strong staining intensity.   Cancer Staging  Ductal carcinoma in situ (DCIS) of left breast Staging form: Breast, AJCC 8th Edition - Clinical: Stage 0 (cTis (DCIS), cN0, cM0, ER+, PR+) - Signed by Lowella Dell, MD on 01/01/2021 Nuclear grade: G1  Malignant neoplasm of overlapping sites of right breast in female, estrogen receptor positive (HCC) Staging form: Breast, AJCC 8th Edition - Clinical: No Stage Recommended (ycT1c, cN0, cM0, G1, ER+, PR+, HER2-) - Signed by Lowella Dell, MD on 01/01/2021 Stage prefix: Post-therapy    The patient's subsequent history is as detailed below.   PAST MEDICAL HISTORY: Past Medical History:  Diagnosis Date   Adrenal insufficiency (HCC) 07/25/2021   Anxiety    Arrhythmia    right bundle branch block   Bipolar 1 disorder (HCC)    Cancer (HCC)    right breast cancer   Depression    Dyspnea    with exertion   GERD (gastroesophageal reflux disease)    Headache    Heart murmur    Hyperlipidemia    Hypertension    Hypothyroidism    Morbid obesity (HCC)    Osteoarthritis    Pneumonia    PONV (postoperative nausea and vomiting)    Schizo-affective schizophrenia (HCC)    Thyroid disease    hypothyroidism  History of: dilated cardiomyopathy, bundle branch block, heart murmur, rosacea, psychiatric hospitalization in 1999 for bipolar disorder, chronic schizophrenia, and early Alzheimer's disease   PAST SURGICAL HISTORY: Past Surgical History:  Procedure Laterality Date   BREAST SURGERY     CHOLECYSTECTOMY     DILATION AND CURETTAGE OF UTERUS     ESOPHAGEAL MANOMETRY N/A 01/29/2018   Procedure: ESOPHAGEAL MANOMETRY (EM);  Surgeon: Graylin Shiver, MD;  Location: [facility];  Service: Endoscopy;  Laterality: N/A;   HAND SURGERY     Right-trigger finger   IR THORACENTESIS ASP PLEURAL SPACE W/IMG GUIDE  06/28/2018   IR THORACENTESIS ASP PLEURAL SPACE W/IMG GUIDE  07/19/2018   KNEE SURGERY     Left    TONSILLECTOMY     TOTAL KNEE ARTHROPLASTY Left 07/08/2022   Procedure: TOTAL KNEE ARTHROPLASTY;  Surgeon: Durene Romans, MD;  Location: [facility];  Service: Orthopedics;  Laterality: Left;    FAMILY HISTORY: Family History  Problem Relation Age of Onset   Hypertension Mother    Lung cancer Brother    Schizophrenia Brother    The patient's mother died from pneumonia at the age of 31 in the setting of Parkinson's disease.  The patient's father died at the age of 8.  The patient's only sibling was a brother who died from lung cancer in his fifties.    She is not aware of any breast ovarian or prostate cancer in the family   GYNECOLOGIC HISTORY:  No LMP recorded. Patient is postmenopausal. Menarche: 82 years old GX P 0 LMP ~age 47 Contraceptive HRT used for about 2 years  Hysterectomy? no BSO? 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  She is now retired.  She has lived in [place] about [place] years.  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  She is divorced. She lives in [place].  She attends [employer].    ADVANCED DIRECTIVES:  The patient has named Paula Compton as her healthcare power of attorney   HEALTH MAINTENANCE: Social History   Tobacco Use   Smoking status: Former    Types: Cigarettes    Quit date: 12/08/1978    Years since quitting: 44.4   Smokeless tobacco: Never  Vaping Use   Vaping Use: Never used  Substance Use Topics   Alcohol use: Never   Drug use: No      Allergies  Allergen Reactions   Lithium Nausea Only   Fluoxetine Other (See Comments)    Pt felt crazy   Macrodantin Other (See Comments)    Unknown reaction   Mirabegron Other (See Comments)    ineffective   Nitrofurantoin Other (See Comments)    Other reaction(s): Did not agree with  me   Paxil [Paroxetine] Other (See Comments)    Made pt feel crazy    Prednisone Other (See Comments)    Makes her feel very jittery   Prozac [Fluoxetine Hcl] Other (See Comments)    Pt felt crazy    Sertraline Hcl Other (See Comments)    Unknown reaction   Solifenacin Other (See Comments)    Ineffective    Sulfa Antibiotics Other (See Comments)    Unknown reaction   Wellbutrin [Bupropion Hcl] Other (See Comments)    Unknown reaction    Current Outpatient Medications  Medication Sig Dispense Refill   acetaminophen (TYLENOL) 500 MG tablet Take 2 tablets (1,000 mg total) by mouth every 8 (eight) hours. (Patient taking differently: Take 1,000 mg by mouth every 12 (twelve) hours as needed.) 30 tablet 0   amLODipine (NORVASC) 2.5 MG tablet Take 2.5 mg by mouth at bedtime.     Ascorbic Acid (VITAMIN C PO) Take 500 mg by mouth every morning.     atorvastatin (LIPITOR) 40 MG tablet Take 1 tablet (40 mg total) by mouth at bedtime. 90 tablet 3   Biotin 10 MG TABS Take 10,000 mcg by mouth every morning.     Calcium Carbonate Antacid (CALCIUM CARBONATE PO) Take 600 mg by mouth every morning.     Cholecalciferol (VITAMIN D) 50 MCG (2000 UT) tablet Take 2,000 Units by mouth daily.     clonazePAM (KLONOPIN) 1 MG tablet Take 1 mg by mouth at bedtime.     co-enzyme Q-10 30 MG capsule Take 30 mg by mouth daily.     Cyanocobalamin (VITAMIN B-12 PO) Take 2,500 mcg by mouth every morning.     docusate sodium (COLACE) 100 MG capsule Take 1 capsule (100 mg total) by mouth 2 (two) times daily. (Patient taking differently: Take 100 mg by mouth daily as needed.) 10 capsule 0   furosemide (LASIX) 20 MG tablet TAKE ONE TABLET BY MOUTH DAILY 30 tablet 0   gabapentin (NEURONTIN) 100 MG capsule Take 3 capsules (300 mg total) by mouth at bedtime. 90 capsule 5   hydrocortisone (CORTEF) 5 MG tablet Take 1 tablet (5 mg total) by mouth as directed. 2 tabs with breakfast, and 1 tablet in the afternoon 300 tablet 3    ibuprofen (ADVIL) 400 MG tablet Take 400 mg by mouth every 8 (eight) hours as needed.     lamoTRIgine (LAMICTAL) 200 MG tablet Take 200 mg by mouth at bedtime.  12   levothyroxine (SYNTHROID) 137 MCG tablet Take 1 tablet (137 mcg total) by mouth daily before breakfast. 90 tablet 3   memantine (NAMENDA) 10 MG tablet Take 10 mg by mouth 2 (two) times daily.  metoprolol tartrate (LOPRESSOR) 25 MG tablet Take 25 mg by mouth 2 (two) times daily.     Multiple Vitamin (MULTIVITAMIN WITH MINERALS) TABS tablet Take 1 tablet by mouth every morning.     OLANZapine (ZYPREXA) 15 MG tablet Take 30 mg by mouth at bedtime.     Omega-3 Fatty Acids (FISH OIL) 1200 MG CAPS Take 1,200 mg by mouth every morning.     omeprazole (PRILOSEC) 40 MG capsule Take 40 mg by mouth in the morning.     polyethylene glycol (MIRALAX / GLYCOLAX) 17 g packet Take 17 g by mouth daily as needed for mild constipation. 14 each 0   polyvinyl alcohol (LIQUIFILM TEARS) 1.4 % ophthalmic solution Place 1 drop into both eyes as needed for dry eyes.     promethazine (PHENERGAN) 12.5 MG tablet Take 1 tablet (12.5 mg total) by mouth every 6 (six) hours as needed for nausea or vomiting. 15 tablet 0   ramelteon (ROZEREM) 8 MG tablet Take 8 mg by mouth at bedtime.     spironolactone (ALDACTONE) 25 MG tablet TAKE 1 TABLET (25 MG TOTAL) BY MOUTH DAILY. 90 tablet 0   traZODone (DESYREL) 50 MG tablet Take 150 mg by mouth at bedtime.     No current facility-administered medications for this visit.    OBJECTIVE:   There were no vitals filed for this visit.    There is no height or weight on file to calculate BMI.   Wt Readings from Last 3 Encounters:  03/19/23 188 lb (85.3 kg)  03/09/23 191 lb (86.6 kg)  02/09/23 190 lb (86.2 kg)     ECOG FS:2 - Symptomatic, <50% confined to bed  Physical exam deferred, telephone visit Chronic venous stasis BLE.  LAB RESULTS:  CMP     Component Value Date/Time   NA 138 04/28/2023 1050   NA 142  05/11/2014 0911   K 4.7 04/28/2023 1050   K 4.1 05/11/2014 0911   CL 100 04/28/2023 1050   CL 105 05/12/2013 1452   CO2 24 04/28/2023 1050   CO2 19 (L) 05/11/2014 0911   GLUCOSE 124 (H) 04/28/2023 1050   GLUCOSE 103 (H) 02/09/2023 0933   GLUCOSE 106 05/11/2014 0911   GLUCOSE 100 (H) 05/12/2013 1452   BUN 20 04/28/2023 1050   BUN 14.9 05/11/2014 0911   CREATININE 0.81 04/28/2023 1050   CREATININE 0.79 01/01/2021 1508   CREATININE 0.89 10/10/2015 0933   CREATININE 0.9 05/11/2014 0911   CALCIUM 10.2 04/28/2023 1050   CALCIUM 9.9 05/11/2014 0911   PROT 6.8 01/02/2023 1900   PROT 6.8 05/11/2014 0911   ALBUMIN 4.1 02/09/2023 0933   ALBUMIN 3.9 05/11/2014 0911   AST 31 01/02/2023 1900   AST 18 01/01/2021 1508   AST 37 (H) 05/11/2014 0911   ALT 25 01/02/2023 1900   ALT 33 01/01/2021 1508   ALT 40 05/11/2014 0911   ALKPHOS 54 01/02/2023 1900   ALKPHOS 52 05/11/2014 0911   BILITOT 0.5 01/02/2023 1900   BILITOT 0.3 01/01/2021 1508   BILITOT 0.33 05/11/2014 0911   GFRNONAA >60 01/02/2023 1900   GFRNONAA >60 01/01/2021 1508   GFRAA >60 08/20/2020 0333    No results found for: "TOTALPROTELP", "ALBUMINELP", "A1GS", "A2GS", "BETS", "BETA2SER", "GAMS", "MSPIKE", "SPEI"  Lab Results  Component Value Date   WBC 5.8 01/02/2023   NEUTROABS 3.5 01/02/2023   HGB 12.6 01/02/2023   HCT 37.0 01/02/2023   MCV 94.3 01/02/2023   PLT 204 01/02/2023  Lab Results  Component Value Date   LABCA2 12 04/01/2012    No components found for: "ZOXWRU045"  No results for input(s): "INR" in the last 168 hours.  Lab Results  Component Value Date   LABCA2 12 04/01/2012    No results found for: "CAN199"  No results found for: "CAN125"  No results found for: "CAN153"  No results found for: "CA2729"  No components found for: "HGQUANT"  No results found for: "CEA1", "CEA" / No results found for: "CEA1", "CEA"   No results found for: "AFPTUMOR"  No results found for:  "CHROMOGRNA"  No results found for: "KPAFRELGTCHN", "LAMBDASER", "KAPLAMBRATIO" (kappa/lambda light chains)  No results found for: "HGBA", "HGBA2QUANT", "HGBFQUANT", "HGBSQUAN" (Hemoglobinopathy evaluation)   Lab Results  Component Value Date   LDH 207 (H) 08/17/2020    No results found for: "IRON", "TIBC", "IRONPCTSAT" (Iron and TIBC)  Lab Results  Component Value Date   FERRITIN 97 08/17/2020    Urinalysis    Component Value Date/Time   COLORURINE STRAW (A) 03/03/2022 1310   APPEARANCEUR CLEAR 03/03/2022 1310   LABSPEC 1.006 03/03/2022 1310   PHURINE 8.0 03/03/2022 1310   GLUCOSEU NEGATIVE 03/03/2022 1310   HGBUR NEGATIVE 03/03/2022 1310   BILIRUBINUR NEGATIVE 03/03/2022 1310   KETONESUR NEGATIVE 03/03/2022 1310   PROTEINUR NEGATIVE 03/03/2022 1310   NITRITE NEGATIVE 03/03/2022 1310   LEUKOCYTESUR NEGATIVE 03/03/2022 1310    STUDIES: No results found.   ELIGIBLE FOR AVAILABLE RESEARCH PROTOCOL: no  ASSESSMENT: 82 y.o. [place] woman with  RIGHT BREAST CANCER:  (1) status post right lumpectomy and sentinel lymph node sampling in November 2010 for a T1cN0, stage IA invasive lobular carcinoma, grade 1, strongly estrogen and progesterone receptor positive, HER-2/neu negative, with low MIB-1.     (2) Status post ADJUVANT radiation therapy, completed in February 2011.   (3) Did not tolerate aromatase anastrozole or letrozole and WAs not felt to be a candidate for tamoxifen--opted for observation alone and was discharged from follow-up 2015  LEFT BREAST CANCER (4) status post left breast biopsy 12/20/2020 for a clinically 1 cm ductal carcinoma in situ, grade 1, strongly estrogen and progesterone receptor positive  (5) refused standard of care lumpectomy, opted for observation alone  (6) she will have bi-annual mammograms in May and November indefinitely   PLAN:  Most recent mammogram from May 2024 with stable appearance of biopsy-proven DCIS calcifications  in the left breast upper outer quadrant. She has been staying active, denies any new health issues at all.  Her knees working better and she has been exercising more. She already has a mammogram scheduled for November.  She would like to come in person for her November visit.  She was strongly encouraged to call us with any new questions or concerns in the interim. Encouraged regular walks.  Total time spent: 8 min  I connected with  Joyce Copa Kozicki on 05/01/23 by a telephone application and verified that I am speaking with the correct person using two identifiers.   I discussed the limitations of evaluation and management by telemedicine. The patient expressed understanding and agreed to proceed.   *Total Encounter Time as defined by the Centers for Medicare and Medicaid Services includes, in addition to the face-to-face time of a patient visit (documented in the note above) non-face-to-face time: obtaining and reviewing outside history, ordering and reviewing medications, tests or procedures, care coordination (communications with other health care professionals or caregivers) and documentation in the medical record.

## 2023-05-01 NOTE — Telephone Encounter (Signed)
I have called the patient and discussed medication with the patient. She has no more questions at this time.

## 2023-05-01 NOTE — Telephone Encounter (Signed)
Pt called in and left a message. She stated she would like to find out how much gabapentin Dr. Everlena Cooper has her on.

## 2023-05-05 ENCOUNTER — Telehealth: Payer: Self-pay | Admitting: Hematology and Oncology

## 2023-05-05 ENCOUNTER — Telehealth: Payer: Self-pay

## 2023-05-05 ENCOUNTER — Telehealth (HOSPITAL_BASED_OUTPATIENT_CLINIC_OR_DEPARTMENT_OTHER): Payer: Self-pay

## 2023-05-05 MED ORDER — GABAPENTIN 100 MG PO CAPS: 300.0000 mg | ORAL_CAPSULE | Freq: Every day | ORAL | 5 refills | Status: DC

## 2023-05-05 NOTE — Telephone Encounter (Signed)
LMOVM  please call office back with pharmacy.

## 2023-05-05 NOTE — Telephone Encounter (Signed)
Pt called in stating she lives at [facility] and they need a fax stating the dosage and frequency of the gabapentin she takes daily. She is being given too much. Their fax number is [phone] ATTN: [facility].

## 2023-05-05 NOTE — Telephone Encounter (Addendum)
Results called to patient who verbalizes understanding!   ----- Message from Alver Sorrow, NP sent at 05/02/2023  4:52 PM EDT ----- Stable kidney function. Normal electrolytes. Good result!

## 2023-05-05 NOTE — Telephone Encounter (Signed)
Left message for patient provide fax number to the facility where she needs her medication list sent to.

## 2023-05-05 NOTE — Telephone Encounter (Signed)
Left patient a vm regarding upcoming appointment  

## 2023-05-06 ENCOUNTER — Other Ambulatory Visit: Payer: Self-pay | Admitting: Neurology

## 2023-05-06 MED ORDER — GABAPENTIN 100 MG PO CAPS: 300.0000 mg | ORAL_CAPSULE | Freq: Every day | ORAL | 5 refills | Status: DC

## 2023-05-06 NOTE — Addendum Note (Signed)
Addended by: Leida Lauth on: 05/06/2023 01:42 PM   Modules accepted: Orders

## 2023-05-08 ENCOUNTER — Telehealth: Payer: Self-pay

## 2023-05-08 NOTE — Telephone Encounter (Signed)
VM left by [facility], [facility] uses [name]. Please send Gabapentin to [facility] pharmacy and not [facility].

## 2023-05-11 ENCOUNTER — Other Ambulatory Visit: Payer: Self-pay | Admitting: Neurology

## 2023-05-11 ENCOUNTER — Other Ambulatory Visit (HOSPITAL_BASED_OUTPATIENT_CLINIC_OR_DEPARTMENT_OTHER): Payer: Self-pay | Admitting: Cardiovascular Disease

## 2023-05-11 DIAGNOSIS — I5032 Chronic diastolic (congestive) heart failure: Secondary | ICD-10-CM

## 2023-05-11 DIAGNOSIS — R0609 Other forms of dyspnea: Secondary | ICD-10-CM

## 2023-05-11 MED ORDER — GABAPENTIN 100 MG PO CAPS: 300.0000 mg | ORAL_CAPSULE | Freq: Every day | ORAL | 5 refills | Status: DC

## 2023-05-11 NOTE — Telephone Encounter (Signed)
Rx(s) sent to pharmacy electronically.  

## 2023-05-19 DIAGNOSIS — H40013 Open angle with borderline findings, low risk, bilateral: Secondary | ICD-10-CM | POA: Diagnosis not present

## 2023-05-19 DIAGNOSIS — T1500XA Foreign body in cornea, unspecified eye, initial encounter: Secondary | ICD-10-CM | POA: Diagnosis not present

## 2023-05-25 DIAGNOSIS — E039 Hypothyroidism, unspecified: Secondary | ICD-10-CM | POA: Diagnosis not present

## 2023-05-25 DIAGNOSIS — E782 Mixed hyperlipidemia: Secondary | ICD-10-CM | POA: Diagnosis not present

## 2023-05-25 DIAGNOSIS — I10 Essential (primary) hypertension: Secondary | ICD-10-CM | POA: Diagnosis not present

## 2023-05-25 DIAGNOSIS — K219 Gastro-esophageal reflux disease without esophagitis: Secondary | ICD-10-CM | POA: Diagnosis not present

## 2023-05-25 DIAGNOSIS — G301 Alzheimer's disease with late onset: Secondary | ICD-10-CM | POA: Diagnosis not present

## 2023-06-02 DIAGNOSIS — L738 Other specified follicular disorders: Secondary | ICD-10-CM | POA: Diagnosis not present

## 2023-06-02 DIAGNOSIS — B0089 Other herpesviral infection: Secondary | ICD-10-CM | POA: Diagnosis not present

## 2023-06-02 DIAGNOSIS — Z85828 Personal history of other malignant neoplasm of skin: Secondary | ICD-10-CM | POA: Diagnosis not present

## 2023-06-02 DIAGNOSIS — L570 Actinic keratosis: Secondary | ICD-10-CM | POA: Diagnosis not present

## 2023-06-02 DIAGNOSIS — L821 Other seborrheic keratosis: Secondary | ICD-10-CM | POA: Diagnosis not present

## 2023-06-02 DIAGNOSIS — L820 Inflamed seborrheic keratosis: Secondary | ICD-10-CM | POA: Diagnosis not present

## 2023-06-05 DIAGNOSIS — R7303 Prediabetes: Secondary | ICD-10-CM | POA: Diagnosis not present

## 2023-06-05 DIAGNOSIS — N1831 Chronic kidney disease, stage 3a: Secondary | ICD-10-CM | POA: Diagnosis not present

## 2023-06-05 DIAGNOSIS — Z23 Encounter for immunization: Secondary | ICD-10-CM | POA: Diagnosis not present

## 2023-06-05 DIAGNOSIS — Z1331 Encounter for screening for depression: Secondary | ICD-10-CM | POA: Diagnosis not present

## 2023-06-05 DIAGNOSIS — Z0000 Encounter for general adult medical examination without abnormal findings: Secondary | ICD-10-CM | POA: Diagnosis not present

## 2023-06-05 DIAGNOSIS — Z79899 Other long term (current) drug therapy: Secondary | ICD-10-CM | POA: Diagnosis not present

## 2023-06-05 DIAGNOSIS — I10 Essential (primary) hypertension: Secondary | ICD-10-CM | POA: Diagnosis not present

## 2023-06-05 DIAGNOSIS — G301 Alzheimer's disease with late onset: Secondary | ICD-10-CM | POA: Diagnosis not present

## 2023-06-05 DIAGNOSIS — E782 Mixed hyperlipidemia: Secondary | ICD-10-CM | POA: Diagnosis not present

## 2023-06-05 DIAGNOSIS — I700 Atherosclerosis of aorta: Secondary | ICD-10-CM | POA: Diagnosis not present

## 2023-06-05 DIAGNOSIS — E559 Vitamin D deficiency, unspecified: Secondary | ICD-10-CM | POA: Diagnosis not present

## 2023-06-05 DIAGNOSIS — F259 Schizoaffective disorder, unspecified: Secondary | ICD-10-CM | POA: Diagnosis not present

## 2023-06-05 DIAGNOSIS — E2740 Unspecified adrenocortical insufficiency: Secondary | ICD-10-CM | POA: Diagnosis not present

## 2023-06-05 DIAGNOSIS — Z9181 History of falling: Secondary | ICD-10-CM | POA: Diagnosis not present

## 2023-06-05 DIAGNOSIS — F0280 Dementia in other diseases classified elsewhere without behavioral disturbance: Secondary | ICD-10-CM | POA: Diagnosis not present

## 2023-06-08 ENCOUNTER — Ambulatory Visit (INDEPENDENT_AMBULATORY_CARE_PROVIDER_SITE_OTHER): Payer: Medicare HMO | Admitting: Podiatry

## 2023-06-08 ENCOUNTER — Other Ambulatory Visit: Payer: Self-pay | Admitting: Podiatry

## 2023-06-08 ENCOUNTER — Ambulatory Visit (INDEPENDENT_AMBULATORY_CARE_PROVIDER_SITE_OTHER): Payer: Medicare HMO

## 2023-06-08 DIAGNOSIS — M7751 Other enthesopathy of right foot: Secondary | ICD-10-CM

## 2023-06-08 DIAGNOSIS — S99921A Unspecified injury of right foot, initial encounter: Secondary | ICD-10-CM

## 2023-06-08 MED ORDER — TRIAMCINOLONE ACETONIDE 10 MG/ML IJ SUSP
10.0000 mg | Freq: Once | INTRAMUSCULAR | Status: AC
  Administered 2023-06-08: 10 mg

## 2023-06-08 NOTE — Progress Notes (Signed)
Subjective:   Patient ID: [name], female   DOB: 82 y.o.   MRN: [id number]   HPI Patient states she is getting a lot of pain in the big toe joint of the right foot and at times in the right ankle on the outside.  States has been hurting for a number of months and gradually becoming more aggravating for   ROS      Objective:  Physical Exam  Neurovascular status intact inflammation around the first MPJ right fluid buildup around the joint surface     Assessment:  Inflammatory capsulitis first MPJ right with moderate reduction range of motion     Plan:  Sterile prep injected periarticular around the first MPJ 3 mg Kenalog 5 mg Xylocaine and advised on reduced activity reappoint to recheck  X-rays indicate there is some spurring around the joint moderate narrowness of the joint surface

## 2023-07-20 ENCOUNTER — Encounter (HOSPITAL_BASED_OUTPATIENT_CLINIC_OR_DEPARTMENT_OTHER): Payer: Self-pay | Admitting: Family

## 2023-07-20 ENCOUNTER — Ambulatory Visit (HOSPITAL_BASED_OUTPATIENT_CLINIC_OR_DEPARTMENT_OTHER): Payer: Medicare HMO | Admitting: Family

## 2023-07-20 VITALS — BP 142/74 | HR 89 | Ht 62.5 in | Wt 181.0 lb

## 2023-07-20 DIAGNOSIS — I5032 Chronic diastolic (congestive) heart failure: Secondary | ICD-10-CM | POA: Diagnosis not present

## 2023-07-20 DIAGNOSIS — E785 Hyperlipidemia, unspecified: Secondary | ICD-10-CM | POA: Diagnosis not present

## 2023-07-20 DIAGNOSIS — E2740 Unspecified adrenocortical insufficiency: Secondary | ICD-10-CM

## 2023-07-20 DIAGNOSIS — I10 Essential (primary) hypertension: Secondary | ICD-10-CM

## 2023-07-20 DIAGNOSIS — I2510 Atherosclerotic heart disease of native coronary artery without angina pectoris: Secondary | ICD-10-CM | POA: Diagnosis not present

## 2023-07-20 NOTE — Progress Notes (Signed)
Cardiology Office Note:  .   Date:  07/20/2023  ID:  [name], DOB 1940-12-24, MRN [id number] PCP: Thana Ates, MD  [facility] Providers Cardiologist:  Chilton Si, MD    History of Present Illness: .   [name] is a 82 y.o. female with a hx of hypertension, HLD, chronic shortness of breath, chronic diastolic heart failure, morbid obesity, prior breast cancer, Alzheimer's disease, schizo-affective disorder and bipolar disorder. Previous patient of Dr. Patty Sermons has since established with Dr. Duke Salvia.   Echo 04/2008 normal LVEF, mild AS, trace MR/TR. Myoview 05/2014 negative for ischemia.   Admitted 08/2020 with fall and fevere. Chest CT with coronary calcifications. 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 Symptoms felt to be due to demand ischemia. Myoview 09/2020 normal LVEF 60%, no perfusion deficits. In outpatient setting Crestor added. Concern for apical hypertrophy but as not ICD candidate and asymtpomatic, no further testing performed. 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    Saw Dr. Duke Salvia 07/25/21. BP was well controlled, hair growth improved with hydrocortisone for adrenal insufficiency felt to be due to frequent steroid injections.    ED visit 01/02/23 with dyspnea. CT negative for PE, troponin flat in the 60s, with symptoms improved and discharged home. She saw Dr. Francine Graven 03/09/23 with PFTs revealing mild restrictive defect. She was recommended for echocardiogram, sleep study, and consideration of CT if workup unremarkable.   Seen 03/19/23 with BP controlled, exertional dyspnea unchanged. Echo normal LVEF 60-65%, severe concentric LVH, gr2DD, RV dilated, mildly elevated PASP (RVSP 39 mmHg), mild MR/AI. Slight increase in RSVP compared to prior.   Presents today for follow up with her friend [name]. Lives at [employer]. Continues to work to adjust her diet significantly to help lose weight. Her recent  labwork with PCP revealed prediabetes. Enjoys reading in her spare time, often 3 books per week. Recently pulled a muscle in her back so has not been to exercise class in acouple weeks. Plans to resume. She is taking Aleve BID for her back pain. Notes the Aleve makes her feel a bit sleepy. Exertional dyspnea stable at baseline. Occasional orthopnea.   ROS: Please see the history of present illness.    All other systems reviewed and are negative.   Studies Reviewed: .        Cardiac Studies & Procedures     STRESS TESTS  MYOCARDIAL PERFUSION IMAGING 09/07/2020  Narrative  Nuclear stress EF: 60%.  There was no ST segment deviation noted during stress.  The study is normal.  This is a low risk study.  The left ventricular ejection fraction is normal (55-65%).  Normal stress nuclear study with no ischemia or infarction; EF 60 with normal wall motion.   ECHOCARDIOGRAM  ECHOCARDIOGRAM COMPLETE 03/19/2023  Narrative ECHOCARDIOGRAM REPORT    Patient Name:   [name] Date of Exam: 03/19/2023 Medical Rec #:  [id number]     Height:       62.5 in Accession #:    [id number]    Weight:       191.0 lb Date of Birth:  07/30/1941      BSA:          1.886 m Patient Age:    81 years      BP:           124/73 mmHg Patient Gender: F             HR:  75 bpm. Exam Location:  Outpatient  Procedure: 2D Echo, 3D Echo, Color Doppler, Cardiac Doppler and Strain Analysis  Indications:    Shortness of Breath  History:        Patient has prior history of Echocardiogram examinations, most recent 08/18/2020. Previous Myocardial Infarction, Arrythmias:RBBB; Risk Factors:Hypertension, Former Smoker and Dyslipidemia.  Sonographer:    Jeryl Columbia RDCS Referring Phys: [id number] Martina Sinner   Sonographer Comments: Patient unable to lay on left side. Test performed supine. IMPRESSIONS   1. Left ventricular ejection fraction, by estimation, is 60 to 65%. The left ventricle has  normal function. The left ventricle has no regional wall motion abnormalities. There is severe concentric left ventricular hypertrophy. Left ventricular diastolic parameters are consistent with Grade II diastolic dysfunction (pseudonormalization). 2. Right ventricular systolic function is normal. The right ventricular size is dilated. There is mildly elevated pulmonary artery systolic pressure. The estimated right ventricular systolic pressure is 39.0 mmHg. 3. Left atrial size was mildly dilated. 4. The mitral valve is grossly normal. Mild mitral valve regurgitation. 5. The aortic valve is tricuspid. Aortic valve regurgitation is mild. No aortic stenosis is present. 6. The inferior vena cava is normal in size with greater than 50% respiratory variability, suggesting right atrial pressure of 3 mmHg.  Comparison(s): Prior images reviewed side by side. Slight increase in RVSP.  FINDINGS Left Ventricle: Left ventricular ejection fraction, by estimation, is 60 to 65%. The left ventricle has normal function. The left ventricle has no regional wall motion abnormalities. Global longitudinal strain performed but not reported based on interpreter judgement due to suboptimal tracking. 3D ejection fraction reviewed and evaluated as part of the interpretation. Alternate measurement of EF is felt to be most reflective of LV function. The left ventricular internal cavity size was normal in size. There is severe concentric left ventricular hypertrophy. Left ventricular diastolic parameters are consistent with Grade II diastolic dysfunction (pseudonormalization).  Right Ventricle: The right ventricular size is dilated. No increase in right ventricular wall thickness. Right ventricular systolic function is normal. There is mildly elevated pulmonary artery systolic pressure. The tricuspid regurgitant velocity is 3.00 m/s, and with an assumed right atrial pressure of 3 mmHg, the estimated right ventricular systolic  pressure is 39.0 mmHg.  Left Atrium: Left atrial size was mildly dilated.  Right Atrium: Right atrial size was normal in size.  Pericardium: There is no evidence of pericardial effusion.  Mitral Valve: The mitral valve is grossly normal. Mild to moderate mitral annular calcification. Mild mitral valve regurgitation. MV peak gradient, 9.4 mmHg. The mean mitral valve gradient is 3.0 mmHg.  Tricuspid Valve: The tricuspid valve is normal in structure. Tricuspid valve regurgitation is not demonstrated. No evidence of tricuspid stenosis.  Aortic Valve: The aortic valve is tricuspid. Aortic valve regurgitation is mild. Aortic regurgitation PHT measures 413 msec. No aortic stenosis is present.  Pulmonic Valve: The pulmonic valve was normal in structure. Pulmonic valve regurgitation is not visualized. No evidence of pulmonic stenosis.  Aorta: The aortic root and ascending aorta are structurally normal, with no evidence of dilitation.  Venous: The inferior vena cava is normal in size with greater than 50% respiratory variability, suggesting right atrial pressure of 3 mmHg.  IAS/Shunts: There is left bowing of the interatrial septum, suggestive of elevated right atrial pressure. There is right bowing of the interatrial septum, suggestive of elevated left atrial pressure. No atrial level shunt detected by color flow Doppler.   LEFT VENTRICLE PLAX 2D LVIDd:  4.21 cm   Diastology LVIDs:         2.40 cm   LV e' medial:    3.59 cm/s LV PW:         1.49 cm   LV E/e' medial:  21.0 LV IVS:        1.31 cm   LV e' lateral:   6.42 cm/s LVOT diam:     1.80 cm   LV E/e' lateral: 11.7 LV SV:         72 LV SV Index:   38 LVOT Area:     2.54 cm  3D Volume EF: 3D EF:        53 % LV EDV:       128 ml LV ESV:       60 ml LV SV:        68 ml  RIGHT VENTRICLE RV Basal diam:  4.65 cm RV Mid diam:    3.01 cm RV S prime:     10.40 cm/s TAPSE (M-mode): 2.2 cm  LEFT ATRIUM             Index         RIGHT ATRIUM           Index LA diam:        4.00 cm 2.12 cm/m   RA Area:     16.10 cm LA Vol (A2C):   52.4 ml 27.79 ml/m  RA Volume:   38.80 ml  20.57 ml/m LA Vol (A4C):   74.1 ml 39.29 ml/m LA Biplane Vol: 66.0 ml 35.00 ml/m AORTIC VALVE LVOT Vmax:   123.00 cm/s LVOT Vmean:  80.900 cm/s LVOT VTI:    0.281 m AI PHT:      413 msec  AORTA Ao Root diam: 3.30 cm Ao Asc diam:  3.50 cm  MITRAL VALVE                TRICUSPID VALVE MV Area (PHT): 3.37 cm     TR Peak grad:   36.0 mmHg MV Area VTI:   1.91 cm     TR Vmax:        300.00 cm/s MV Peak grad:  9.4 mmHg MV Mean grad:  3.0 mmHg     SHUNTS MV Vmax:       1.53 m/s     Systemic VTI:  0.28 m MV Vmean:      76.6 cm/s    Systemic Diam: 1.80 cm MV Decel Time: 225 msec MR Peak grad: 116.2 mmHg MR Mean grad: 67.0 mmHg MR Vmax:      539.00 cm/s MR Vmean:     383.0 cm/s MV E velocity: 75.30 cm/s MV A velocity: 144.00 cm/s MV E/A ratio:  0.52  Riley Lam MD Electronically signed by Riley Lam MD Signature Date/Time: 03/19/2023/1:57:44 PM    Final             Risk Assessment/Calculations:            Physical Exam:   VS:  BP (!) 142/74   Pulse 89   Ht 5' 2.5" (1.588 m)   Wt 181 lb (82.1 kg)   BMI 32.58 kg/m    Wt Readings from Last 3 Encounters:  07/20/23 181 lb (82.1 kg)  03/19/23 188 lb (85.3 kg)  03/09/23 191 lb (86.6 kg)    GEN: Well nourished, overweight,  well developed in no acute distress NECK: No JVD; No carotid bruits CARDIAC: RRR, no murmurs,  rubs, gallops RESPIRATORY:  Clear to auscultation without rales, wheezing or rhonchi  ABDOMEN: Soft, non-tender, non-distended EXTREMITIES:  No edema; No deformity   ASSESSMENT AND PLAN: .    DOE- Multifactorial obesity, deconditioning, diastolic heart failure, restrictive lung disease. Follows with Dr. Francine Graven of pulmonology. Heart failure management, as below.    HTN- BP elevated in clinic associated with pain from pulled back muscle.  Routinely controlled at home. She will contact us if BP persistently >130/80 by home monitoring. Continue current antihypertensive regimen Amlodipine 2.5mg  QD, Lopressor 25mg  BID, Spironolactone 25mg  QD.  BP at home 120s-130s. Discussed to monitor BP at home at least 2 hours after medications and sitting for 5-10 minutes. If elevated in future could increase dose of Amlodipine.   Hypothyroidism - Night sweats and fatigue improved since adjusted dose of Levothyroxine. Follows with endocrinology.   Diastolic heart failure - Echo 0/10/93 normal LVEF, known severe LVH, gr2DD, mildly elevated PASP, RA pressure . Continue Furosemide 20mg  every day. Exertional dyspnea stable at baseline.  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 Will defer further evaluation. Continue Metoprolol.   Adrenal insufficiency - Follows with endocrinology.    Coronary calcification on CT / HLD - Stable with no anginal symptoms. No indication for ischemic evaluation.  Continue Rosuvastatin.        Dispo: follow up in 6 months  Signed, Alver Sorrow, NP

## 2023-07-20 NOTE — Patient Instructions (Addendum)
Medication Instructions:  Your physician recommends that you continue on your current medications as directed. Please refer to the Current Medication list given to you today.  *If you need a refill on your cardiac medications before your next appointment, please call your pharmacy*  Follow-Up: At [facility], you and your health needs are our priority.  As part of our continuing mission to provide you with exceptional heart care, we have created designated Provider Care Teams.  These Care Teams include your primary Cardiologist (physician) and Advanced Practice Providers (APPs -  Physician Assistants and Nurse Practitioners) who all work together to provide you with the care you need, when you need it.  We recommend signing up for the patient portal called "MyChart".  Sign up information is provided on this After Visit Summary.  MyChart is used to connect with patients for Virtual Visits (Telemedicine).  Patients are able to view lab/test results, encounter notes, upcoming appointments, etc.  Non-urgent messages can be sent to your provider as well.   To learn more about what you can do with MyChart, go to [email or web address].    Your next appointment:   6 months with Dr. Duke Salvia or Gillian Shields, NP   Other Instructions [email or web address]  If we needed a medication for blood sugar could consider Farxiga or Jardiance as they lower blood sugar and help reduce cardiovascular risk.

## 2023-07-21 ENCOUNTER — Encounter: Payer: Self-pay | Admitting: Pulmonary Disease

## 2023-07-21 ENCOUNTER — Ambulatory Visit: Payer: Medicare HMO | Admitting: Pulmonary Disease

## 2023-07-21 VITALS — BP 128/82 | HR 70 | Temp 97.9°F | Ht 62.0 in | Wt 180.0 lb

## 2023-07-21 DIAGNOSIS — R0602 Shortness of breath: Secondary | ICD-10-CM

## 2023-07-21 DIAGNOSIS — J984 Other disorders of lung: Secondary | ICD-10-CM

## 2023-07-21 DIAGNOSIS — I5032 Chronic diastolic (congestive) heart failure: Secondary | ICD-10-CM | POA: Diagnosis not present

## 2023-07-21 NOTE — Progress Notes (Signed)
Synopsis: Referred in February 2024 for shortness of breath by Eliane Decree, PA  Subjective:   PATIENT ID: [name] GENDER: female DOB: July 25, 1941, MRN: [id number]  HPI  Chief Complaint  Patient presents with   Follow-up    SOB    [name] is an 82 year old woman, former smoker with history adrenal insufficiency, bipolar disorder, GERD, hypertension, hyperlipidemia, obesity and hypothyroidism who returns to pulmonary clinic for shortness of breath.   She continues to have exertional shortness of breath.  Echo 03/19/2023 showed grade 2 diastolic dysfunction and severe concentric LVH.  Right ventricular systolic function is normal and size is dilated.  There is mildly elevated pulmonary artery systolic pressure at 39 mmHg.  She was started on Lasix 20 mg daily by her cardiology team.  She is accompanied by her daughter.  She is working on increasing her physical activity at the facility in which she resides.  OV 03/09/23 She was trialed on advair 115-66mcg 2 puffs twice daily without any improvement in her breathing.   She feels her breathing has been worse these past few days. She has increased anxiety which can also lead to shortness of breath.   She is scheduled for echo on 4/11.   PFTs show mild restriction.   Home sleep study shows mild sleep apnea, AHI 5.1/hr with O2 nadir of 83%, avg SpO2 92%.   Initial OV 01/22/23 She developed shortness of breath over the past couple of weeks. She went to the ER 01/02/23. CTA was negative for pulmonary emboli and EKG did no show ischemic changes. Her troponins were flat. Blood pressure was 185/26mmHg. She continues to have exertional dyspnea with wheezing, cough and production of white mucous. She has had exertional dyspnea for along time, but feels her symptoms are different now. No history of wheezing. She denies any history of sleep apnea or waking up gasping.   She quit smoking 40 years ago. She smoked 2 packs per day for 14 years.  She has history of second hand smoke exposure in childhood. No history of dust or chemical exposures. 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  Past Medical History:  Diagnosis Date   Adrenal insufficiency (HCC) 07/25/2021   Anxiety    Arrhythmia    right bundle branch block   Bipolar 1 disorder (HCC)    Cancer (HCC)    right breast cancer   Depression    Dyspnea    with exertion   GERD (gastroesophageal reflux disease)    Headache    Heart murmur    Hyperlipidemia    Hypertension    Hypothyroidism    Morbid obesity (HCC)    Osteoarthritis    Pneumonia    PONV (postoperative nausea and vomiting)    Schizo-affective schizophrenia (HCC)    Thyroid disease    hypothyroidism     Family History  Problem Relation Age of Onset   Hypertension Mother    Lung cancer Brother    Schizophrenia Brother      Social History   Socioeconomic History   Marital status: Divorced    Spouse name: Not on file   Number of children: Not on file   Years of education: Not on file   Highest education level: Not on file  Occupational History   Not on file  Tobacco Use   Smoking status: Former    Current packs/day: 0.00    Types: Cigarettes    Quit date: 12/08/1978  Years since quitting: 44.6   Smokeless tobacco: Never  Vaping Use   Vaping status: Never Used  Substance and Sexual Activity   Alcohol use: Never   Drug use: No   Sexual activity: Not on file  Other Topics Concern   Not on file  Social History Narrative   Lives in [place] (has been there 10 years) - states in independent living / has a nurse helps her with meds      Degrees in English Lit and nursing      No caffeine   Right handed   One story      Social Determinants of Health   Financial Resource Strain: Low Risk  (03/19/2023)   Overall Financial Resource Strain (CARDIA)    Difficulty of Paying Living Expenses: Not hard at all  Food Insecurity: No Food Insecurity (03/19/2023)    Hunger Vital Sign    Worried About Running Out of Food in the Last Year: Never true    Ran Out of Food in the Last Year: Never true  Transportation Needs: No Transportation Needs (03/19/2023)   PRAPARE - Administrator, Civil Service (Medical): No    Lack of Transportation (Non-Medical): No  Physical Activity: Insufficiently Active (03/19/2023)   Exercise Vital Sign    Days of Exercise per Week: 3 days    Minutes of Exercise per Session: 30 min  Stress: Not on file  Social Connections: Not on file  Intimate Partner Violence: Not on file     Allergies  Allergen Reactions   Lithium Nausea Only   Fluoxetine Other (See Comments)    Pt felt crazy   Macrodantin Other (See Comments)    Unknown reaction   Mirabegron Other (See Comments)    ineffective   Nitrofurantoin Other (See Comments)    Other reaction(s): Did not agree with me   Paxil [Paroxetine] Other (See Comments)    Made pt feel crazy    Prednisone Other (See Comments)    Makes her feel very jittery   Prozac [Fluoxetine Hcl] Other (See Comments)    Pt felt crazy    Sertraline Hcl Other (See Comments)    Unknown reaction   Solifenacin Other (See Comments)    Ineffective    Sulfa Antibiotics Other (See Comments)    Unknown reaction   Wellbutrin [Bupropion Hcl] Other (See Comments)    Unknown reaction     Outpatient Medications Prior to Visit  Medication Sig Dispense Refill   amLODipine (NORVASC) 2.5 MG tablet Take 2.5 mg by mouth at bedtime.     Ascorbic Acid (VITAMIN C PO) Take 500 mg by mouth every morning.     atorvastatin (LIPITOR) 40 MG tablet Take 1 tablet (40 mg total) by mouth at bedtime. 90 tablet 3   Biotin 10 MG TABS Take 10,000 mcg by mouth every morning.     Calcium Carbonate Antacid (CALCIUM CARBONATE PO) Take 600 mg by mouth every morning.     Cholecalciferol (VITAMIN D) 50 MCG (2000 UT) tablet Take 2,000 Units by mouth daily.     clonazePAM (KLONOPIN) 0.5 MG tablet Take 0.5 mg by mouth at  bedtime.     co-enzyme Q-10 30 MG capsule Take 30 mg by mouth daily.     Cyanocobalamin (VITAMIN B-12 PO) Take 2,500 mcg by mouth every morning.     docusate sodium (COLACE) 100 MG capsule Take 1 capsule (100 mg total) by mouth 2 (two) times daily. (Patient taking differently: Take 100 mg  by mouth daily as needed.) 10 capsule 0   furosemide (LASIX) 20 MG tablet TAKE ONE TABLET BY MOUTH DAILY 90 tablet 2   gabapentin (NEURONTIN) 100 MG capsule Take 3 capsules (300 mg total) by mouth at bedtime. 90 capsule 5   hydrocortisone (CORTEF) 5 MG tablet Take 1 tablet (5 mg total) by mouth as directed. 2 tabs with breakfast, and 1 tablet in the afternoon 300 tablet 3   lamoTRIgine (LAMICTAL) 200 MG tablet Take 200 mg by mouth at bedtime.  12   levothyroxine (SYNTHROID) 137 MCG tablet Take 1 tablet (137 mcg total) by mouth daily before breakfast. 90 tablet 3   memantine (NAMENDA) 10 MG tablet Take 10 mg by mouth 2 (two) times daily.     metoprolol tartrate (LOPRESSOR) 25 MG tablet Take 25 mg by mouth 2 (two) times daily.     Multiple Vitamin (MULTIVITAMIN WITH MINERALS) TABS tablet Take 1 tablet by mouth every morning.     Naproxen Sodium (ALEVE) 220 MG CAPS Take 1 capsule by mouth 2 (two) times daily.     OLANZapine (ZYPREXA) 15 MG tablet Take 30 mg by mouth at bedtime.     Omega-3 Fatty Acids (FISH OIL) 1200 MG CAPS Take 1,200 mg by mouth every morning.     omeprazole (PRILOSEC) 40 MG capsule Take 40 mg by mouth in the morning.     polyethylene glycol (MIRALAX / GLYCOLAX) 17 g packet Take 17 g by mouth daily as needed for mild constipation. 14 each 0   polyvinyl alcohol (LIQUIFILM TEARS) 1.4 % ophthalmic solution Place 1 drop into both eyes as needed for dry eyes.     promethazine (PHENERGAN) 12.5 MG tablet Take 1 tablet (12.5 mg total) by mouth every 6 (six) hours as needed for nausea or vomiting. 15 tablet 0   ramelteon (ROZEREM) 8 MG tablet Take 8 mg by mouth at bedtime.     spironolactone (ALDACTONE)  25 MG tablet TAKE 1 TABLET (25 MG TOTAL) BY MOUTH DAILY. 90 tablet 0   traZODone (DESYREL) 50 MG tablet Take 150 mg by mouth at bedtime.     No facility-administered medications prior to visit.   Review of Systems  Constitutional:  Negative for chills, fever, malaise/fatigue and weight loss.  HENT:  Negative for congestion, sinus pain and sore throat.   Eyes: Negative.   Respiratory:  Positive for shortness of breath. Negative for cough, hemoptysis, sputum production and wheezing.   Cardiovascular:  Negative for chest pain, palpitations, orthopnea, claudication and leg swelling.  Gastrointestinal:  Negative for abdominal pain, heartburn, nausea and vomiting.  Genitourinary: Negative.   Musculoskeletal:  Negative for joint pain and myalgias.  Skin:  Negative for rash.  Neurological:  Negative for weakness.  Endo/Heme/Allergies: Negative.   Psychiatric/Behavioral: Negative.     Objective:   Vitals:   07/21/23 1128  BP: 128/82  Pulse: 70  Temp: 97.9 F (36.6 C)  TempSrc: Temporal  SpO2: 96%  Weight: 180 lb (81.6 kg)  Height: 5\' 2"  (1.575 m)   Physical Exam Constitutional:      General: She is not in acute distress.    Appearance: She is obese. She is not ill-appearing.  HENT:     Head: Normocephalic and atraumatic.  Eyes:     Conjunctiva/sclera: Conjunctivae normal.  Cardiovascular:     Rate and Rhythm: Normal rate and regular rhythm.     Pulses: Normal pulses.     Heart sounds: Normal heart sounds. No murmur heard. Pulmonary:  Effort: Pulmonary effort is normal.     Breath sounds: Normal breath sounds. No wheezing, rhonchi or rales.  Musculoskeletal:     Right lower leg: No edema.     Left lower leg: No edema.  Skin:    General: Skin is warm and dry.  Neurological:     General: No focal deficit present.     Mental Status: She is alert.    CBC    Component Value Date/Time   WBC 5.8 01/02/2023 1900   RBC 4.18 01/02/2023 1900   HGB 12.6 01/02/2023 1952    HGB 14.4 01/01/2021 1508   HGB 13.1 05/11/2014 0910   HCT 37.0 01/02/2023 1952   HCT 39.1 05/11/2014 0910   PLT 204 01/02/2023 1900   PLT 258 01/01/2021 1508   PLT 250 05/11/2014 0910   MCV 94.3 01/02/2023 1900   MCV 91.5 05/11/2014 0910   MCH 31.6 01/02/2023 1900   MCHC 33.5 01/02/2023 1900   RDW 14.6 01/02/2023 1900   RDW 14.0 05/11/2014 0910   LYMPHSABS 1.4 01/02/2023 1900   LYMPHSABS 2.4 05/11/2014 0910   MONOABS 0.7 01/02/2023 1900   MONOABS 0.5 05/11/2014 0910   EOSABS 0.0 01/02/2023 1900   EOSABS 0.1 05/11/2014 0910   BASOSABS 0.0 01/02/2023 1900   BASOSABS 0.0 05/11/2014 0910      Latest Ref Rng & Units 04/28/2023   10:50 AM 04/06/2023    9:48 AM 02/09/2023    9:33 AM  BMP  Glucose 70 - 99 mg/dL 161  096  045   BUN 8 - 27 mg/dL 20  12  27    Creatinine 0.57 - 1.00 mg/dL 4.09  8.11  9.14   BUN/Creat Ratio 12 - 28 25  18     Sodium 134 - 144 mmol/L 138  141  141   Potassium 3.5 - 5.2 mmol/L 4.7  4.7  4.8   Chloride 96 - 106 mmol/L 100  101  100   CO2 20 - 29 mmol/L 24  23  30    Calcium 8.7 - 10.3 mg/dL 78.2  95.6  21.3    Chest imaging: CTA Chest PE 01/03/23 Cardiovascular: Satisfactory opacification of the pulmonary arteries to the segmental level. No evidence of pulmonary embolism. Mild cardiomegaly. Coronary artery and aortic atherosclerotic calcification. No pericardial effusion.   Mediastinum/Nodes: Stable mild mediastinal adenopathy. For example a right paratracheal node measures 1.1 cm (8/84). Unremarkable esophagus.   Lungs/Pleura: Respiratory motion obscures fine detail. Expiratory phase exam. No focal consolidation, pleural effusion, or pneumothorax. Bibasilar atelectasis/scarring. No pleural effusion or pneumothorax.  PFT:    Latest Ref Rng & Units 03/06/2023    8:49 AM  PFT Results  FVC-Pre L 1.76   FVC-Predicted Pre % 75   FVC-Post L 1.86   FVC-Predicted Post % 79   Pre FEV1/FVC % % 87   Post FEV1/FCV % % 89   FEV1-Pre L 1.53    FEV1-Predicted Pre % 88   FEV1-Post L 1.65   DLCO uncorrected ml/min/mmHg 14.52   DLCO UNC% % 81   DLCO corrected ml/min/mmHg 14.52   DLCO COR %Predicted % 81   DLVA Predicted % 106   TLC L 3.28   TLC % Predicted % 67   RV % Predicted % 50     Labs:  Path:  Echo 08/18/20: LV EF 70-75%. LV is hyperdynamic. RV sis is mildly enlarged with normal function.  Echo 03/19/2023 LVEF 60 to 65%.  Severe concentric LVH.  Grade 2 diastolic  dysfunction.  RV systolic function is normal.  RV size is dilated.  Mildly elevated pulmonary artery systolic pressure 39 mmHg.  Left atrium mildly dilated.  Heart Catheterization:  Assessment & Plan:   Chronic diastolic CHF (congestive heart failure) (HCC)  Restrictive lung disease  Shortness of breath  Discussion: Chevon Marier is an 82 year old woman, former smoker with history adrenal insufficiency, bipolar disorder, GERD, hypertension, hyperlipidemia, obesity and hypothyroidism who returns to pulmonary clinic for shortness of breath.   She has diastolic congestive heart failure based on recent echo and has been started on 20 mg of Lasix daily for her cardiology team.  She did not have response to Advair inhaler trial. PFTs show mild restrictive defect which is likely due to her obesity and kyphosis. Deconditioning is also contributing to her dyspnea. She does not have significant sleep apnea contributing to her dyspnea.   Recommend increasing her physical activity level. She will go to her facilities exercise classes. Encouraged her to walk on the days she does not go to exercise class.   Follow up in 6 months.   Melody Comas, MD [facility] Office: [phone]    Current Outpatient Medications:    amLODipine (NORVASC) 2.5 MG tablet, Take 2.5 mg by mouth at bedtime., Disp: , Rfl:    Ascorbic Acid (VITAMIN C PO), Take 500 mg by mouth every morning., Disp: , Rfl:    atorvastatin (LIPITOR) 40 MG tablet, Take 1 tablet  (40 mg total) by mouth at bedtime., Disp: 90 tablet, Rfl: 3   Biotin 10 MG TABS, Take 10,000 mcg by mouth every morning., Disp: , Rfl:    Calcium Carbonate Antacid (CALCIUM CARBONATE PO), Take 600 mg by mouth every morning., Disp: , Rfl:    Cholecalciferol (VITAMIN D) 50 MCG (2000 UT) tablet, Take 2,000 Units by mouth daily., Disp: , Rfl:    clonazePAM (KLONOPIN) 0.5 MG tablet, Take 0.5 mg by mouth at bedtime., Disp: , Rfl:    co-enzyme Q-10 30 MG capsule, Take 30 mg by mouth daily., Disp: , Rfl:    Cyanocobalamin (VITAMIN B-12 PO), Take 2,500 mcg by mouth every morning., Disp: , Rfl:    docusate sodium (COLACE) 100 MG capsule, Take 1 capsule (100 mg total) by mouth 2 (two) times daily. (Patient taking differently: Take 100 mg by mouth daily as needed.), Disp: 10 capsule, Rfl: 0   furosemide (LASIX) 20 MG tablet, TAKE ONE TABLET BY MOUTH DAILY, Disp: 90 tablet, Rfl: 2   gabapentin (NEURONTIN) 100 MG capsule, Take 3 capsules (300 mg total) by mouth at bedtime., Disp: 90 capsule, Rfl: 5   hydrocortisone (CORTEF) 5 MG tablet, Take 1 tablet (5 mg total) by mouth as directed. 2 tabs with breakfast, and 1 tablet in the afternoon, Disp: 300 tablet, Rfl: 3   lamoTRIgine (LAMICTAL) 200 MG tablet, Take 200 mg by mouth at bedtime., Disp: , Rfl: 12   levothyroxine (SYNTHROID) 137 MCG tablet, Take 1 tablet (137 mcg total) by mouth daily before breakfast., Disp: 90 tablet, Rfl: 3   memantine (NAMENDA) 10 MG tablet, Take 10 mg by mouth 2 (two) times daily., Disp: , Rfl:    metoprolol tartrate (LOPRESSOR) 25 MG tablet, Take 25 mg by mouth 2 (two) times daily., Disp: , Rfl:    Multiple Vitamin (MULTIVITAMIN WITH MINERALS) TABS tablet, Take 1 tablet by mouth every morning., Disp: , Rfl:    Naproxen Sodium (ALEVE) 220 MG CAPS, Take 1 capsule by mouth 2 (two) times  daily., Disp: , Rfl:    OLANZapine (ZYPREXA) 15 MG tablet, Take 30 mg by mouth at bedtime., Disp: , Rfl:    Omega-3 Fatty Acids (FISH OIL) 1200 MG CAPS,  Take 1,200 mg by mouth every morning., Disp: , Rfl:    omeprazole (PRILOSEC) 40 MG capsule, Take 40 mg by mouth in the morning., Disp: , Rfl:    polyethylene glycol (MIRALAX / GLYCOLAX) 17 g packet, Take 17 g by mouth daily as needed for mild constipation., Disp: 14 each, Rfl: 0   polyvinyl alcohol (LIQUIFILM TEARS) 1.4 % ophthalmic solution, Place 1 drop into both eyes as needed for dry eyes., Disp: , Rfl:    promethazine (PHENERGAN) 12.5 MG tablet, Take 1 tablet (12.5 mg total) by mouth every 6 (six) hours as needed for nausea or vomiting., Disp: 15 tablet, Rfl: 0   ramelteon (ROZEREM) 8 MG tablet, Take 8 mg by mouth at bedtime., Disp: , Rfl:    spironolactone (ALDACTONE) 25 MG tablet, TAKE 1 TABLET (25 MG TOTAL) BY MOUTH DAILY., Disp: 90 tablet, Rfl: 0   traZODone (DESYREL) 50 MG tablet, Take 150 mg by mouth at bedtime., Disp: , Rfl:

## 2023-07-21 NOTE — Patient Instructions (Addendum)
Continue to work on weight loss and increasing your physical activity and monitor if the shortness of breath improves  We can consider a night time oxygen test in the future to determine if you need supplemental oxygen when sleeping.   Follow up in 6 months

## 2023-07-24 DIAGNOSIS — M791 Myalgia, unspecified site: Secondary | ICD-10-CM | POA: Diagnosis not present

## 2023-07-24 DIAGNOSIS — M545 Low back pain, unspecified: Secondary | ICD-10-CM | POA: Diagnosis not present

## 2023-07-29 ENCOUNTER — Encounter: Payer: Self-pay | Admitting: Pulmonary Disease

## 2023-08-17 ENCOUNTER — Ambulatory Visit (INDEPENDENT_AMBULATORY_CARE_PROVIDER_SITE_OTHER): Payer: Medicare HMO | Admitting: Internal Medicine

## 2023-08-17 ENCOUNTER — Encounter: Payer: Self-pay | Admitting: Internal Medicine

## 2023-08-17 VITALS — BP 136/82 | HR 102 | Ht 62.0 in | Wt 180.0 lb

## 2023-08-17 DIAGNOSIS — E119 Type 2 diabetes mellitus without complications: Secondary | ICD-10-CM | POA: Insufficient documentation

## 2023-08-17 DIAGNOSIS — E039 Hypothyroidism, unspecified: Secondary | ICD-10-CM | POA: Diagnosis not present

## 2023-08-17 DIAGNOSIS — E2749 Other adrenocortical insufficiency: Secondary | ICD-10-CM | POA: Diagnosis not present

## 2023-08-17 NOTE — Patient Instructions (Addendum)
-   HOLD Biotin 2-3 days prior to any future thyroid blood work  - Continue  Hydrocortisone 5 mg, TWO tablets every morning and ONE tablet every afternoon between  2-4 pm  - Continue Levothyroxine 137 mcg daily   ADRENAL INSUFFICIENCY SICK DAY RULES:  Should you face an extreme emotional or physical stress such as trauma, surgery or acute illness, this will require extra steroid coverage so that the body can meet that stress.   Without increasing the steroid dose you may experience severe weakness, headache, dizziness, nausea and vomiting and possibly a more serious deterioration in health.  Typically the dose of steroids will only need to be increased for a couple of days if you have an illness that is transient and managed in the community.   If you are unable to take/absorb an increased dose of steroids orally because of vomiting or diarrhea, you will urgently require steroid injections and should present to an Emergency Department.  The general advice for any serious illness is as follows: Double the normal daily steroid dose for up to 3 days if you have a temperature of more than 37.50C (99.57F) with signs of sickness, or severe emotional or physical distress Contact your primary care doctor and Endocrinologist if the illness worsens or it lasts for more than 3 days.  In cases of severe illness, urgent medical assistance should be promptly sought. If you experience vomiting/diarrhea or are unable to take steroids by mouth, please administer the Hydrocortisone injection kit and seek urgent medical help.

## 2023-08-17 NOTE — Progress Notes (Signed)
Name: [name]  MRN/ DOB: [id number], [birth year]    Age/ Sex: 82 y.o., female     PCP: Thana Ates, MD   Reason for Endocrinology Evaluation: Adrenal insufficiency      Initial Endocrinology Clinic Visit: 07/10/2021    PATIENT IDENTIFIER: [name] is a 82 y.o., female with a past medical history of CAD, CHF, IBS, hypothyroidism and Hx of breast ca ( S/P lumpectomy and radiation 2010) . She has followed with [facility] since 07/10/2021 for consultative assistance with management of her adrenal insufficiency.   HISTORICAL SUMMARY:  She was diagnosed with Secondary adrenal insufficiency based on abnormal Cosyntropin Stim Test 60 minute cortisol of 11.8 ug/dL in 03/4033. ACTH at the lower end of normal 7 pg/mL.   Of note, the pt  has been on long term left knee intra-articular injections, declines surgery She is on Hydrocodone for pain   We started [facility] due to chronic use of intra-articular injection    THYROID HISTORY: She has also been noted with low TSH with a nadir of 0.01 uIU/mL in 11/2020. Pt on levothyroxine       HYPERCALCEMIA HISTORY: She has been noted to have hypercalcemia on 04/19/2021 with a corrected serum calcium of 10.4 (reference 8.6-10.3). Repeat labs at our clinic 07/1999 showed normal calcium at 10.3 mg, normal ionized calcium 5.45 mg/dl , normal Vit D 74.25 ng/mL as well as normal 1,25 dihydroxy vitamin D 53 pg/mL but low PTH at 12 pg/mL and low PTHrP 10 pg/mL    DM HISTORY: She was diagnosed with DM June/2024 with an A1c of 6.5%.  She was maintained on a low-carb diet   SUBJECTIVE:    Today (08/17/2023):  [name] is here for adrenal insufficiency, hypothyroidism.   She continues to follow-up with pulmonary for evaluation of shortness of breath, echo 03/2023 showed diastolic dysfunction and severe concentric LVH    She continues to follow-up with oncology for Hx noninvasive breast cancer  She is accompanied by her friend  [name] She has DM, with A1c 6.5% 06/05/2023, she was advised to follow a low-carb diet, no medications.  Patient would like for me to check her A1c Denies  local neck swelling  She is on Biotin  Has occasional constipation with diarrhea  Has cortisone injection in the back last month, has helped with      Hydrocortisone 5 mg, 2 tablets with breakfast and 1 tablet in the afternoon levothyroxine 137  MCG , 1 tablet daily    HISTORY:  Past Medical History:  Past Medical History:  Diagnosis Date   Adrenal insufficiency (HCC) 07/25/2021   Anxiety    Arrhythmia    right bundle branch block   Bipolar 1 disorder (HCC)    Cancer (HCC)    right breast cancer   Depression    Dyspnea    with exertion   GERD (gastroesophageal reflux disease)    Headache    Heart murmur    Hyperlipidemia    Hypertension    Hypothyroidism    Morbid obesity (HCC)    Osteoarthritis    Pneumonia    PONV (postoperative nausea and vomiting)    Schizo-affective schizophrenia (HCC)    Thyroid disease    hypothyroidism   Past Surgical History:  Past Surgical History:  Procedure Laterality Date   BREAST SURGERY     CHOLECYSTECTOMY     DILATION AND CURETTAGE OF UTERUS     ESOPHAGEAL MANOMETRY N/A 01/29/2018  Procedure: ESOPHAGEAL MANOMETRY (EM);  Surgeon: Graylin Shiver, MD;  Location: [facility];  Service: Endoscopy;  Laterality: N/A;   HAND SURGERY     Right-trigger finger   IR THORACENTESIS ASP PLEURAL SPACE W/IMG GUIDE  06/28/2018   IR THORACENTESIS ASP PLEURAL SPACE W/IMG GUIDE  07/19/2018   KNEE SURGERY     Left   TONSILLECTOMY     TOTAL KNEE ARTHROPLASTY Left 07/08/2022   Procedure: TOTAL KNEE ARTHROPLASTY;  Surgeon: Durene Romans, MD;  Location: [facility];  Service: Orthopedics;  Laterality: Left;   Social History:  reports that she quit smoking about 44 years ago. Her smoking use included cigarettes. She has never used smokeless tobacco. She reports that she does not drink alcohol and does not  use drugs. Family History:  Family History  Problem Relation Age of Onset   Hypertension Mother    Lung cancer Brother    Schizophrenia Brother      HOME MEDICATIONS: Allergies as of 08/17/2023       Reactions   Lithium Nausea Only   Fluoxetine Other (See Comments)   Pt felt crazy   Macrodantin Other (See Comments)   Unknown reaction   Mirabegron Other (See Comments)   ineffective   Nitrofurantoin Other (See Comments)   Other reaction(s): Did not agree with me   Paxil [paroxetine] Other (See Comments)   Made pt feel crazy   Prednisone Other (See Comments)   Makes her feel very jittery   Prozac [fluoxetine Hcl] Other (See Comments)   Pt felt crazy   Sertraline Hcl Other (See Comments)   Unknown reaction   Solifenacin Other (See Comments)   Ineffective    Sulfa Antibiotics Other (See Comments)   Unknown reaction   Wellbutrin [bupropion Hcl] Other (See Comments)   Unknown reaction        Medication List        Accurate as of August 17, 2023 11:06 AM. If you have any questions, ask your nurse or doctor.          Aleve 220 MG Caps Generic drug: Naproxen Sodium Take 1 capsule by mouth 2 (two) times daily.   amLODipine 2.5 MG tablet Commonly known as: NORVASC Take 2.5 mg by mouth at bedtime.   atorvastatin 40 MG tablet Commonly known as: LIPITOR Take 1 tablet (40 mg total) by mouth at bedtime.   Biotin 10 MG Tabs Take 10,000 mcg by mouth every morning.   CALCIUM CARBONATE PO Take 600 mg by mouth every morning.   clonazePAM 0.5 MG tablet Commonly known as: KLONOPIN Take 0.5 mg by mouth at bedtime.   co-enzyme Q-10 30 MG capsule Take 30 mg by mouth daily.   docusate sodium 100 MG capsule Commonly known as: COLACE Take 1 capsule (100 mg total) by mouth 2 (two) times daily. What changed:  when to take this reasons to take this   Fish Oil 1200 MG Caps Take 1,200 mg by mouth every morning.   furosemide 20 MG tablet Commonly known as:  LASIX TAKE ONE TABLET BY MOUTH DAILY   gabapentin 100 MG capsule Commonly known as: Neurontin Take 3 capsules (300 mg total) by mouth at bedtime.   hydrocortisone 5 MG tablet Commonly known as: CORTEF Take 1 tablet (5 mg total) by mouth as directed. 2 tabs with breakfast, and 1 tablet in the afternoon   lamoTRIgine 200 MG tablet Commonly known as: LAMICTAL Take 200 mg by mouth at bedtime.   levothyroxine 137 MCG tablet  Commonly known as: SYNTHROID Take 1 tablet (137 mcg total) by mouth daily before breakfast.   memantine 10 MG tablet Commonly known as: NAMENDA Take 10 mg by mouth 2 (two) times daily.   metoprolol tartrate 25 MG tablet Commonly known as: LOPRESSOR Take 25 mg by mouth 2 (two) times daily.   multivitamin with minerals Tabs tablet Take 1 tablet by mouth every morning.   OLANZapine 15 MG tablet Commonly known as: ZYPREXA Take 30 mg by mouth at bedtime.   omeprazole 40 MG capsule Commonly known as: PRILOSEC Take 40 mg by mouth in the morning.   polyethylene glycol 17 g packet Commonly known as: MIRALAX / GLYCOLAX Take 17 g by mouth daily as needed for mild constipation.   polyvinyl alcohol 1.4 % ophthalmic solution Commonly known as: LIQUIFILM TEARS Place 1 drop into both eyes as needed for dry eyes.   promethazine 12.5 MG tablet Commonly known as: PHENERGAN Take 1 tablet (12.5 mg total) by mouth every 6 (six) hours as needed for nausea or vomiting.   ramelteon 8 MG tablet Commonly known as: ROZEREM Take 8 mg by mouth at bedtime.   spironolactone 25 MG tablet Commonly known as: ALDACTONE TAKE 1 TABLET (25 MG TOTAL) BY MOUTH DAILY.   traZODone 50 MG tablet Commonly known as: DESYREL Take 150 mg by mouth at bedtime.   VITAMIN B-12 PO Take 2,500 mcg by mouth every morning.   VITAMIN C PO Take 500 mg by mouth every morning.   Vitamin D 50 MCG (2000 UT) tablet Take 2,000 Units by mouth daily.          OBJECTIVE:   PHYSICAL  EXAM: VS: BP 136/82 (BP Location: Left Arm, Patient Position: Sitting, Cuff Size: Large)   Pulse (!) 102   Ht 5\' 2"  (1.575 m)   Wt 180 lb (81.6 kg)   SpO2 95%   BMI 32.92 kg/m    EXAM: General: Pt appears well and is in NAD  Lungs: Clear with good BS bilat   Heart: Auscultation: RRR.  Extremities:  BL LE: No pretibial edema normal ROM and strength.  Mental Status: Judgment, insight: Intact Orientation: Oriented to time, place, and person Mood and affect: No depression, anxiety, or agitation     DATA REVIEWED: 06/05/2023 A1c 6.5% GFR 74 Cr. 0.790    Latest Reference Range & Units 02/09/23 09:33  Sodium 135 - 145 mEq/L 141  Potassium 3.5 - 5.1 mEq/L 4.8  Chloride 96 - 112 mEq/L 100  CO2 19 - 32 mEq/L 30  Glucose 70 - 99 mg/dL 409 (H)  BUN 6 - 23 mg/dL 27 (H)  Creatinine 8.11 - 1.20 mg/dL 9.14  Calcium 8.4 - 78.2 mg/dL 95.6  Albumin 3.5 - 5.2 g/dL 4.1  GFR >21.30 mL/min 63.62      ASSESSMENT / PLAN / RECOMMENDATIONS:    Secondary Adrenal Insufficiency:   -Patient had an abnormal cosyntropin stimulation test in 04/2021 -I suspect this is secondary adrenal insufficiency due to chronic intra-articular injections as well as chronic opiate use -She continues to receive intra-articular injections -We had discussed sick day rules   Medication Continue hydrocortisone 5 mg, 2 tablets with breakfast and 1 tablet in the afternoon   2. Hypothyroidism   -Unable to proceed with TFT checkup as the patient is on biotin -She was advised to hold biotin 2-3 days prior to any future thyroid checkup   Medication Continue  levothyroxine 137 mcg daily    3. T2DM:  -This has  been managed with low-carb diet through PCPs office, but the patient would like to have an A1c done, patient will return for repeat labs   F/U in 6 months     Signed electronically by: Lyndle Herrlich, MD  [facility]  [facility] [address] [place], [place] [address] Phone: [phone] FAX: [phone]      CC: Thana Ates, MD [address] [place] [address] Phone: [phone]  Fax: [phone]   Return to Endocrinology clinic as below: Future Appointments  Date Time Provider Department Center  09/14/2023  9:30 AM Drema Dallas, DO [facility] None  11/02/2023  1:00 PM Rachel Moulds, MD [facility] None  12/31/2023  9:45 AM Rachel Moulds, MD [facility] None

## 2023-09-11 DIAGNOSIS — M791 Myalgia, unspecified site: Secondary | ICD-10-CM | POA: Diagnosis not present

## 2023-09-11 DIAGNOSIS — M545 Low back pain, unspecified: Secondary | ICD-10-CM | POA: Diagnosis not present

## 2023-09-11 NOTE — Progress Notes (Unsigned)
NEUROLOGY FOLLOW UP OFFICE NOTE  [name] [id number]  Assessment/Plan:   1.  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  Cervical spondylosis   1.  Increase gabapentin to 300mg  at bedtime to achieve further reduction of headache frequency. 2.  May use Tylenol or ibuprofen for rescue treatment, just limit use of pain relievers to no more than 2 days out of week to prevent risk of rebound or medication-overuse headache. 3.  Follow up one year  Subjective:  [name] is an 45 year right-handed old female with HTN, HLD, hypothyroidism, Bipolar 1 disorder, schizoaffective disorder, Alzheimer's disease and history of breast cancer who follows up for headache.   UPDATE: Increased gabapentin last year.     Current NSAIDS:  Ibuprofen 400mg  twice daily with food Current analgesics:  Tylenol Current triptans:  none Current ergotamine:  none Current anti-emetic:  Zofran 4mg  Current muscle relaxants:  Robaxin Current anti-anxiolytic:  alprazolam Current sleep aide:  ramelteon 8mg  Current Antihypertensive medications:  Metoprolol tartrate 25mg  daily, spironolactone Current Antidepressant/antipsychotic medications:  Olanzapine 15mg  twice daily, Seroquel 25mg  daily Current Anticonvulsant medications:   Gabapentin 300 mg at bedtime; lamotrigine 150mg  daily Current anti-CGRP:  none Current Vitamins/Herbal/Supplements:  D, Fish  Oil, MVI Current Antihistamines/Decongestants:  none Other therapy:  none Hormone/birth control:  none Other medications:  Synthroid, memantine.  She stopped donepezil and nausea resolved.     HISTORY:  Onset:  Around April 2020.  No preceding event. Location:  Back of head, usually occurs at night, not during the day.  Non-radiating. Quality:  Non-throbbing Initial intensity:  Mild to moderate.  She denies thunderclap headache Aura:  none Premonitory Phase:  none Postdrome:  none Associated symptoms:  She denies associated scalp  paresthesias/allodynia, nausea, vomiting, photophobia, phonophobia, visual disturbance or unilateral numbness or weakness. Initial duration:  Feels it as she goes to bed and falls asleep. If she wakes up throughout the night, she may feel it.  Once she gets up in the morning, she feels okay.  No headaches during the day.   Initial Frequency:  Almost every night Triggers:  Laying down Relieving factors:  Getting up. Activity:  Does not aggravate She followed up with her orthopedist, Dr. Darrelyn Hillock, who ordered a cervical X-ray and was told she had multilevel arthritis.  She was prescribed ibuprofen.       Past imaging (personally reviewed): 04/27/2013 CT Head:  Atrophy and mild chronic small vessel ischemic changes in white matter. No acute intracranial abnormality. 04/27/2013 CT Cervical Spine:  Moderate to advanced cervical degenerative disease with spondylosis and facet hypertrophy as well as multilevel spinal stenosis most prominent at C5-6 and C6-7, as well as foraminal stenosis bilaterally most prominent at C3-4 and C4-5 and on right at C5-6. 07/05/2009 MRI Brain w/o:  Chronic small vessel ischemic changes in hemispheric white matter and frontal and temporal atrophy.  No acute intracranial abnormality. 10/11/2008 MRI Brain w/o:  Frontal and temporal atrophy and chronic small vessel ischemic changes in hemispheric white matter.  No acute intracranial abnormality.   Past NSAIDS:  Ibuprofen 800mg  twice daily (made her feel unsteady with slurred speech), naproxen 220mg  Past analgesics: none Past abortive triptans:  none Past abortive ergotamine:  none Past muscle relaxants:  Tizanidine 4mg  at bedtime (ineffective) Past anti-emetic:  none Past antihypertensive medications:  none Past antidepressant medications:  none Past anticonvulsant medications:  none Past anti-CGRP:  none Past vitamins/Herbal/Supplements:  none Past antihistamines/decongestants:  none Other past therapies:  none  PAST  MEDICAL HISTORY: Past Medical History:  Diagnosis Date   Adrenal insufficiency (HCC) 07/25/2021   Anxiety    Arrhythmia    right bundle branch block   Bipolar 1 disorder (HCC)    Cancer (HCC)    right breast cancer   Depression    Dyspnea    with exertion   GERD (gastroesophageal reflux disease)    Headache    Heart murmur    Hyperlipidemia    Hypertension    Hypothyroidism    Morbid obesity (HCC)    Osteoarthritis    Pneumonia    PONV (postoperative nausea and vomiting)    Schizo-affective schizophrenia (HCC)    Thyroid disease    hypothyroidism    MEDICATIONS: Current Outpatient Medications on File Prior to Visit  Medication Sig Dispense Refill   amLODipine (NORVASC) 2.5 MG tablet Take 2.5 mg by mouth at bedtime.     Ascorbic Acid (VITAMIN C PO) Take 500 mg by mouth every morning.     atorvastatin (LIPITOR) 40 MG tablet Take 1 tablet (40 mg total) by mouth at bedtime. 90 tablet 3   Biotin 10 MG TABS Take 10,000 mcg by mouth every morning.     Calcium Carbonate Antacid (CALCIUM CARBONATE PO) Take 600 mg by mouth every morning.     Cholecalciferol (VITAMIN D) 50 MCG (2000 UT) tablet Take 2,000 Units by mouth daily.     clonazePAM (KLONOPIN) 0.5 MG tablet Take 0.5 mg by mouth at bedtime.     co-enzyme Q-10 30 MG capsule Take 30 mg by mouth daily.     Cyanocobalamin (VITAMIN B-12 PO) Take 2,500 mcg by mouth every morning.     docusate sodium (COLACE) 100 MG capsule Take 1 capsule (100 mg total) by mouth 2 (two) times daily. (Patient taking differently: Take 100 mg by mouth daily as needed.) 10 capsule 0   furosemide (LASIX) 20 MG tablet TAKE ONE TABLET BY MOUTH DAILY 90 tablet 2   gabapentin (NEURONTIN) 100 MG capsule Take 3 capsules (300 mg total) by mouth at bedtime. 90 capsule 5   hydrocortisone (CORTEF) 5 MG tablet Take 1 tablet (5 mg total) by mouth as directed. 2 tabs with breakfast, and 1 tablet in the afternoon 300 tablet 3   lamoTRIgine (LAMICTAL) 200 MG tablet Take  200 mg by mouth at bedtime.  12   levothyroxine (SYNTHROID) 137 MCG tablet Take 1 tablet (137 mcg total) by mouth daily before breakfast. 90 tablet 3   memantine (NAMENDA) 10 MG tablet Take 10 mg by mouth 2 (two) times daily.     metoprolol tartrate (LOPRESSOR) 25 MG tablet Take 25 mg by mouth 2 (two) times daily.     Multiple Vitamin (MULTIVITAMIN WITH MINERALS) TABS tablet Take 1 tablet by mouth every morning.     Naproxen Sodium (ALEVE) 220 MG CAPS Take 1 capsule by mouth 2 (two) times daily.     OLANZapine (ZYPREXA) 15 MG tablet Take 30 mg by mouth at bedtime.     Omega-3 Fatty Acids (FISH OIL) 1200 MG CAPS Take 1,200 mg by mouth every morning.     omeprazole (PRILOSEC) 40 MG capsule Take 40 mg by mouth in the morning.     polyethylene glycol (MIRALAX / GLYCOLAX) 17 g packet Take 17 g by mouth daily as needed for mild constipation. 14 each 0   polyvinyl alcohol (LIQUIFILM TEARS) 1.4 % ophthalmic solution Place 1 drop into both eyes as needed for dry eyes.     promethazine (  PHENERGAN) 12.5 MG tablet Take 1 tablet (12.5 mg total) by mouth every 6 (six) hours as needed for nausea or vomiting. 15 tablet 0   ramelteon (ROZEREM) 8 MG tablet Take 8 mg by mouth at bedtime.     spironolactone (ALDACTONE) 25 MG tablet TAKE 1 TABLET (25 MG TOTAL) BY MOUTH DAILY. 90 tablet 0   traZODone (DESYREL) 50 MG tablet Take 150 mg by mouth at bedtime.     No current facility-administered medications on file prior to visit.    ALLERGIES: Allergies  Allergen Reactions   Lithium Nausea Only   Fluoxetine Other (See Comments)    Pt felt crazy   Macrodantin Other (See Comments)    Unknown reaction   Mirabegron Other (See Comments)    ineffective   Nitrofurantoin Other (See Comments)    Other reaction(s): Did not agree with me   Paxil [Paroxetine] Other (See Comments)    Made pt feel crazy    Prednisone Other (See Comments)    Makes her feel very jittery   Prozac [Fluoxetine Hcl] Other (See Comments)     Pt felt crazy    Sertraline Hcl Other (See Comments)    Unknown reaction   Solifenacin Other (See Comments)    Ineffective    Sulfa Antibiotics Other (See Comments)    Unknown reaction   Wellbutrin [Bupropion Hcl] Other (See Comments)    Unknown reaction    FAMILY HISTORY: Family History  Problem Relation Age of Onset   Hypertension Mother    Lung cancer Brother    Schizophrenia Brother       Objective:  *** General: No acute distress.  Patient appears well-groomed.   Head:  Normocephalic/atraumatic Eyes:  Fundi examined but not visualized Neck: supple, no paraspinal tenderness, full range of motion Heart:  Regular rate and rhythm Neurological Exam: alert and oriented.  Speech fluent and not dysarthric, language intact.  CN II-XII intact. Bulk and tone normal, muscle strength 5/5 throughout.  Sensation to light touch intact.  Deep tendon reflexes 2+ throughout, toes downgoing.  Finger to nose testing intact.  Gait normal, Romberg negative. ***   Shon Millet, DO  CC: Hillard Danker, MD

## 2023-09-14 ENCOUNTER — Encounter: Payer: Self-pay | Admitting: Neurology

## 2023-09-14 ENCOUNTER — Ambulatory Visit (INDEPENDENT_AMBULATORY_CARE_PROVIDER_SITE_OTHER): Payer: Medicare HMO | Admitting: Neurology

## 2023-09-14 VITALS — BP 168/95 | HR 73 | Ht 62.0 in

## 2023-09-14 DIAGNOSIS — G4486 Cervicogenic headache: Secondary | ICD-10-CM | POA: Diagnosis not present

## 2023-09-14 DIAGNOSIS — I10 Essential (primary) hypertension: Secondary | ICD-10-CM

## 2023-09-14 MED ORDER — GABAPENTIN 300 MG PO CAPS: 300.0000 mg | ORAL_CAPSULE | Freq: Every day | ORAL | 5 refills | Status: DC

## 2023-09-14 NOTE — Patient Instructions (Addendum)
We will clarify with [facility] regarding gabapentin.  If you are taking just one pill at bedtime (100mg ), then continue that for now.  We can always increase dose again if needed/as tolerated.  Otherwise, follow up 1 year

## 2023-09-24 ENCOUNTER — Telehealth (HOSPITAL_BASED_OUTPATIENT_CLINIC_OR_DEPARTMENT_OTHER): Payer: Self-pay | Admitting: Family

## 2023-09-24 NOTE — Telephone Encounter (Signed)
Patient is returning RN's call. Please advise.

## 2023-09-24 NOTE — Telephone Encounter (Signed)
Spoke with patient and advised to continue per last office visit with Ronn Melena NP  Potassium has always been on upper end of normal, having labs 10/28 with endocrinologist   Will have labs reviewed and call her if needs to add potassium to current regimen

## 2023-09-24 NOTE — Telephone Encounter (Signed)
New M essage:     Patient says she would like for [facility] to please give her call. She says it is about her medicine.    Pt c/o medication issue:  1. Name of Medication: Lasix and Potassium  2. How are you currently taking this medication (dosage and times per day)?   3. Are you having a reaction (difficulty breathing--STAT)?   4. What is your medication issue? Patient wants to know if she should continue taking her Lasix and if she needs to take Potassium?

## 2023-09-24 NOTE — Telephone Encounter (Signed)
No answer

## 2023-10-05 ENCOUNTER — Other Ambulatory Visit: Payer: Medicare HMO

## 2023-10-19 DIAGNOSIS — R921 Mammographic calcification found on diagnostic imaging of breast: Secondary | ICD-10-CM | POA: Diagnosis not present

## 2023-10-19 DIAGNOSIS — D0512 Intraductal carcinoma in situ of left breast: Secondary | ICD-10-CM | POA: Diagnosis not present

## 2023-10-23 DIAGNOSIS — M545 Low back pain, unspecified: Secondary | ICD-10-CM | POA: Diagnosis not present

## 2023-10-23 DIAGNOSIS — M791 Myalgia, unspecified site: Secondary | ICD-10-CM | POA: Diagnosis not present

## 2023-11-02 ENCOUNTER — Inpatient Hospital Stay: Payer: Medicare HMO | Admitting: Hematology and Oncology

## 2023-11-03 ENCOUNTER — Other Ambulatory Visit: Payer: Medicare HMO

## 2023-11-20 ENCOUNTER — Ambulatory Visit (INDEPENDENT_AMBULATORY_CARE_PROVIDER_SITE_OTHER): Payer: Medicare HMO | Admitting: Podiatry

## 2023-11-20 ENCOUNTER — Encounter: Payer: Self-pay | Admitting: Podiatry

## 2023-11-20 DIAGNOSIS — M79674 Pain in right toe(s): Secondary | ICD-10-CM | POA: Diagnosis not present

## 2023-11-20 DIAGNOSIS — M7751 Other enthesopathy of right foot: Secondary | ICD-10-CM | POA: Diagnosis not present

## 2023-11-20 DIAGNOSIS — B351 Tinea unguium: Secondary | ICD-10-CM | POA: Diagnosis not present

## 2023-11-20 DIAGNOSIS — M79675 Pain in left toe(s): Secondary | ICD-10-CM

## 2023-11-20 MED ORDER — TRIAMCINOLONE ACETONIDE 10 MG/ML IJ SUSP
10.0000 mg | Freq: Once | INTRAMUSCULAR | Status: AC
Start: 2023-11-20 — End: 2023-11-20
  Administered 2023-11-20: 10 mg via INTRA_ARTICULAR

## 2023-11-23 ENCOUNTER — Other Ambulatory Visit: Payer: Self-pay

## 2023-11-23 DIAGNOSIS — E039 Hypothyroidism, unspecified: Secondary | ICD-10-CM

## 2023-11-23 DIAGNOSIS — E2749 Other adrenocortical insufficiency: Secondary | ICD-10-CM

## 2023-11-23 DIAGNOSIS — E119 Type 2 diabetes mellitus without complications: Secondary | ICD-10-CM

## 2023-11-23 NOTE — Progress Notes (Signed)
Subjective:   Patient ID: [name], female   DOB: 82 y.o.   MRN: [id number]   HPI Patient presents stating the big toe joint right has become sore again and I would like an injection as it did well and my nails have gotten elongated and sore   ROS      Objective:  Physical Exam  Fluid buildup around the first MPJ right very painful when I try to move the joint and thickened yellow brittle nailbeds 1-5 both feet     Assessment:  Inflammatory capsulitis first MPJ right along with nail disease 1-5 both feet painful     Plan:  H&P reviewed debrided nailbeds 1-5 both feet no intra genic bleeding and sterile prep injected the first MPJ 3 mg dexamethasone Kenalog 5 mg Xylocaine

## 2023-11-25 ENCOUNTER — Other Ambulatory Visit: Payer: Medicare HMO

## 2023-11-25 DIAGNOSIS — E119 Type 2 diabetes mellitus without complications: Secondary | ICD-10-CM | POA: Diagnosis not present

## 2023-11-25 DIAGNOSIS — E039 Hypothyroidism, unspecified: Secondary | ICD-10-CM | POA: Diagnosis not present

## 2023-11-25 DIAGNOSIS — E2749 Other adrenocortical insufficiency: Secondary | ICD-10-CM | POA: Diagnosis not present

## 2023-11-26 DIAGNOSIS — Z853 Personal history of malignant neoplasm of breast: Secondary | ICD-10-CM | POA: Diagnosis not present

## 2023-11-26 DIAGNOSIS — Z1211 Encounter for screening for malignant neoplasm of colon: Secondary | ICD-10-CM | POA: Diagnosis not present

## 2023-11-26 DIAGNOSIS — Z6832 Body mass index (BMI) 32.0-32.9, adult: Secondary | ICD-10-CM | POA: Diagnosis not present

## 2023-11-26 DIAGNOSIS — Z01419 Encounter for gynecological examination (general) (routine) without abnormal findings: Secondary | ICD-10-CM | POA: Diagnosis not present

## 2023-11-26 DIAGNOSIS — M8580 Other specified disorders of bone density and structure, unspecified site: Secondary | ICD-10-CM | POA: Diagnosis not present

## 2023-11-26 DIAGNOSIS — Z1330 Encounter for screening examination for mental health and behavioral disorders, unspecified: Secondary | ICD-10-CM | POA: Diagnosis not present

## 2023-11-30 ENCOUNTER — Encounter: Payer: Self-pay | Admitting: Internal Medicine

## 2023-11-30 LAB — HEMOGLOBIN A1C
Hgb A1c MFr Bld: 6.7 %{Hb} — ABNORMAL HIGH (ref <5.7)
Mean Plasma Glucose: 146 mg/dL
eAG (mmol/L): 8.1 mmol/L

## 2023-11-30 LAB — BASIC METABOLIC PANEL
BUN: 19 mg/dL (ref 7–25)
CO2: 29 mmol/L (ref 20–32)
Calcium: 10.2 mg/dL (ref 8.6–10.4)
Chloride: 100 mmol/L (ref 98–110)
Creat: 0.8 mg/dL (ref 0.60–0.95)
Glucose, Bld: 120 mg/dL — ABNORMAL HIGH (ref 65–99)
Potassium: 4.3 mmol/L (ref 3.5–5.3)
Sodium: 141 mmol/L (ref 135–146)

## 2023-11-30 LAB — TSH: TSH: 0.85 m[IU]/L (ref 0.40–4.50)

## 2023-11-30 LAB — ACTH: C206 ACTH: <5 pg/mL — ABNORMAL LOW (ref 6–50)

## 2023-12-03 ENCOUNTER — Telehealth: Payer: Self-pay

## 2023-12-03 NOTE — Telephone Encounter (Signed)
Patient asking for lab results from 12/18

## 2023-12-04 DIAGNOSIS — L853 Xerosis cutis: Secondary | ICD-10-CM | POA: Diagnosis not present

## 2023-12-04 DIAGNOSIS — B079 Viral wart, unspecified: Secondary | ICD-10-CM | POA: Diagnosis not present

## 2023-12-04 DIAGNOSIS — L723 Sebaceous cyst: Secondary | ICD-10-CM | POA: Diagnosis not present

## 2023-12-04 DIAGNOSIS — L820 Inflamed seborrheic keratosis: Secondary | ICD-10-CM | POA: Diagnosis not present

## 2023-12-04 DIAGNOSIS — D485 Neoplasm of uncertain behavior of skin: Secondary | ICD-10-CM | POA: Diagnosis not present

## 2023-12-04 DIAGNOSIS — L309 Dermatitis, unspecified: Secondary | ICD-10-CM | POA: Diagnosis not present

## 2023-12-04 DIAGNOSIS — Z85828 Personal history of other malignant neoplasm of skin: Secondary | ICD-10-CM | POA: Diagnosis not present

## 2023-12-10 NOTE — Telephone Encounter (Signed)
Patient has been advised of lab results.  °

## 2023-12-14 ENCOUNTER — Ambulatory Visit (HOSPITAL_BASED_OUTPATIENT_CLINIC_OR_DEPARTMENT_OTHER): Payer: Medicare HMO | Admitting: Family

## 2023-12-31 ENCOUNTER — Ambulatory Visit: Payer: Medicare HMO | Admitting: Hematology and Oncology

## 2024-01-04 DIAGNOSIS — Z85828 Personal history of other malignant neoplasm of skin: Secondary | ICD-10-CM | POA: Diagnosis not present

## 2024-01-04 DIAGNOSIS — C44319 Basal cell carcinoma of skin of other parts of face: Secondary | ICD-10-CM | POA: Diagnosis not present

## 2024-01-07 DIAGNOSIS — J984 Other disorders of lung: Secondary | ICD-10-CM | POA: Diagnosis not present

## 2024-01-07 DIAGNOSIS — R110 Nausea: Secondary | ICD-10-CM | POA: Diagnosis not present

## 2024-01-07 DIAGNOSIS — N139 Obstructive and reflux uropathy, unspecified: Secondary | ICD-10-CM | POA: Diagnosis not present

## 2024-01-07 DIAGNOSIS — R634 Abnormal weight loss: Secondary | ICD-10-CM | POA: Diagnosis not present

## 2024-01-13 DIAGNOSIS — C441191 Basal cell carcinoma of skin of left upper eyelid, including canthus: Secondary | ICD-10-CM | POA: Diagnosis not present

## 2024-01-13 DIAGNOSIS — Z85828 Personal history of other malignant neoplasm of skin: Secondary | ICD-10-CM | POA: Diagnosis not present

## 2024-01-24 NOTE — Progress Notes (Unsigned)
Cardiology Office Note:  .   Date:  01/25/2024  ID:  [name], DOB [birth year], MRN [id number] PCP: Thana Ates, MD  [facility] Providers Cardiologist:  Chilton Si, MD    History of Present Illness: .   [name] is a 83 y.o. female with a hx of hypertension, HLD, chronic shortness of breath, chronic diastolic heart failure, morbid obesity, prior breast cancer, Alzheimer's disease, schizo-affective disorder and bipolar disorder. Previous patient of Dr. Patty Sermons has since established with Dr. Duke Salvia.   Echo 04/2008 normal LVEF, mild AS, trace MR/TR. Myoview 05/2014 negative for ischemia.   Admitted 08/2020 with fall and fever. Chest CT with coronary calcifications. 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 Symptoms felt to be due to demand ischemia. Myoview 09/2020 normal LVEF 60%, no perfusion deficits. In outpatient setting Crestor added. Concern for apical hypertrophy but as not ICD candidate and asymtpomatic, no further testing performed. 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    Saw Dr. Duke Salvia 07/25/21. BP was well controlled, hair growth improved with hydrocortisone for adrenal insufficiency felt to be due to frequent steroid injections.    ED visit 01/02/23 with dyspnea. CT negative for PE, troponin flat in the 60s, with symptoms improved and discharged home. She saw Dr. Francine Graven 03/09/23 with PFTs revealing mild restrictive defect. She was recommended for echocardiogram, sleep study, and consideration of CT if workup unremarkable.   Seen 03/19/23 with BP controlled, exertional dyspnea unchanged. Echo normal LVEF 60-65%, severe concentric LVH, gr2DD, RV dilated, mildly elevated PASP (RVSP 39 mmHg), mild MR/AI. Slight increase in RSVP compared to prior.   Last seen 07/2023 with BP elevated in clinic in setting of back pain but controlled at home. Volume status stable on Lasix 20mg  daily. Working to [employer] to  lose weight and exercising as her back allows.  Presents today for follow up with her friend [name]. She overall feels that things have been going well with her heart. Recently had eye surgery and is recovering well. She has DOE and palpitations on exertion. This is chronic and she feels it is improving. She is due for an appointment with Pulmonology. She is having some dizziness with standing. She is trying to drink plenty of fluids daily and estimates 32 oz. We discussed using packets to flavor water since she does not like the taste of water. She denies checking BP at home It was high initially at 152/72 and came down to 122/78. She is agreeable to increase activity such as walking and stretching a few times per week.   ROS: Please see the history of present illness.    All other systems reviewed and are negative.   Studies Reviewed: Marland Kitchen   EKG Interpretation Date/Time:  Monday January 25 2024 10:40:59 EST Ventricular Rate:  107 PR Interval:  168 QRS Duration:  128 QT Interval:  368 QTC Calculation: 491 R Axis:   36  Text Interpretation: Sinus tachycardia Right bundle branch block Confirmed by Gillian Shields ([id number]) on 01/25/2024 11:00:37 AM       Risk Assessment/Calculations:            Physical Exam:   VS:  BP 122/78   Pulse 74   Ht 5\' 2"  (1.575 m)   Wt 176 lb 3.2 oz (79.9 kg)   SpO2 96%   BMI 32.23 kg/m    Wt Readings from Last 3 Encounters:  01/25/24 176 lb 3.2 oz (79.9 kg)  08/17/23  180 lb (81.6 kg)  07/21/23 180 lb (81.6 kg)    Vitals:   01/25/24 1045 01/25/24 1126  BP: (!) 152/72 122/78  Pulse: (!) 111 74  Height: 5\' 2"  (1.575 m)   Weight: 176 lb 3.2 oz (79.9 kg)   SpO2: 96%   BMI (Calculated): 32.22     GEN: Well nourished, overweight,  well developed in no acute distress NECK: No JVD; No carotid bruits CARDIAC: RRR, no murmurs, rubs, gallops RESPIRATORY:  Clear to auscultation without rales, wheezing or rhonchi  ABDOMEN: Soft, non-tender,  non-distended EXTREMITIES:  No edema; No deformity   ASSESSMENT AND PLAN: .    DOE- Multifactorial obesity, deconditioning, diastolic heart failure, restrictive lung disease. Follows with Dr. Francine Graven of pulmonology. Heart failure management, as below. Encouraged to schedule follow up appointment with Pulmonology. Majority of her palpitations and dyspnea on exertion today seem to be due to not participating in as many activities, discussed walking 3x per week in the hall at her ALF.   HTN- She is not taking BP at home and is agreeable to take it a couple times per week. BP elevated initally at 152/72 and improved to 122/78 at the end of the visit. Continue current antihypertensive regimen Amlodipine 2.5mg  QD, Lopressor 25mg  BID, Spironolactone 25mg  QD. Discussed to monitor BP at home at least 2 hours after medications and sitting for 5-10 minutes. If elevated in future could increase dose of Amlodipine.   Diastolic heart failure - Echo 7/82/95 normal LVEF, known severe LVH, gr2DD, mildly elevated PASP, RA pressure . Continue Furosemide 20mg  every day. Exertional dyspnea stable at baseline.  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 Will defer further evaluation. Continue Metoprolol 25 mg BID.   Adrenal insufficiency / Hypothyroidism - Follows with endocrinology.    Coronary calcification on CT / HLD - Stable with no anginal symptoms. No indication for ischemic evaluation.  Continue atorvastatin 40 mg, Metoprolol tartrate 25mg  BID.  EKG today: Sinus tachycardia with RBBB. No acute ST/T wave changes. Anticipate tachycardia related to deconditioning, as above.  RBBB - stable finding by EKG. Monitor with periodic EKG.        Dispo: follow up in 6 months.   Signed, Alver Sorrow, NP

## 2024-01-25 ENCOUNTER — Ambulatory Visit (HOSPITAL_BASED_OUTPATIENT_CLINIC_OR_DEPARTMENT_OTHER): Payer: Medicare HMO | Admitting: Family

## 2024-01-25 ENCOUNTER — Encounter (HOSPITAL_BASED_OUTPATIENT_CLINIC_OR_DEPARTMENT_OTHER): Payer: Self-pay | Admitting: Family

## 2024-01-25 VITALS — BP 122/78 | HR 74 | Ht 62.0 in | Wt 176.2 lb

## 2024-01-25 DIAGNOSIS — E785 Hyperlipidemia, unspecified: Secondary | ICD-10-CM

## 2024-01-25 DIAGNOSIS — I2510 Atherosclerotic heart disease of native coronary artery without angina pectoris: Secondary | ICD-10-CM

## 2024-01-25 DIAGNOSIS — I5032 Chronic diastolic (congestive) heart failure: Secondary | ICD-10-CM | POA: Diagnosis not present

## 2024-01-25 DIAGNOSIS — I10 Essential (primary) hypertension: Secondary | ICD-10-CM | POA: Diagnosis not present

## 2024-01-25 DIAGNOSIS — R0609 Other forms of dyspnea: Secondary | ICD-10-CM | POA: Diagnosis not present

## 2024-01-25 DIAGNOSIS — I4510 Unspecified right bundle-branch block: Secondary | ICD-10-CM | POA: Diagnosis not present

## 2024-01-25 NOTE — Patient Instructions (Addendum)
Medication Instructions:  Your physician recommends that you continue on your current medications as directed. Please refer to the Current Medication list given to you today.  *If you need a refill on your cardiac medications before your next appointment, please call your pharmacy*  Follow-Up: At [facility] you and your health needs are our priority.  As part of our continuing mission to provide you with exceptional heart care, we have created designated Provider Care Teams.  These Care Teams include your primary Cardiologist (physician) and Advanced Practice Providers (APPs -  Physician Assistants and Nurse Practitioners) who all work together to provide you with the care you need, when you need it.  We recommend signing up for the patient portal called "MyChart".  Sign up information is provided on this After Visit Summary.  MyChart is used to connect with patients for Virtual Visits (Telemedicine).  Patients are able to view lab/test results, encounter notes, upcoming appointments, etc.  Non-urgent messages can be sent to your provider as well.   To learn more about what you can do with MyChart, go to [email or web address].    Your next appointment:   6 month(s)  Provider:   Dr. Duke Salvia or Gillian Shields, NP    Other Instructions Recommend calling pulmonology to schedule your routine follow up appointment with Dr. Francine Graven [phone]

## 2024-01-26 DIAGNOSIS — R300 Dysuria: Secondary | ICD-10-CM | POA: Diagnosis not present

## 2024-02-12 ENCOUNTER — Other Ambulatory Visit (HOSPITAL_BASED_OUTPATIENT_CLINIC_OR_DEPARTMENT_OTHER): Payer: Self-pay | Admitting: Cardiovascular Disease

## 2024-02-12 DIAGNOSIS — R0609 Other forms of dyspnea: Secondary | ICD-10-CM

## 2024-02-12 DIAGNOSIS — I5032 Chronic diastolic (congestive) heart failure: Secondary | ICD-10-CM

## 2024-02-15 ENCOUNTER — Encounter: Payer: Self-pay | Admitting: Internal Medicine

## 2024-02-15 ENCOUNTER — Ambulatory Visit (INDEPENDENT_AMBULATORY_CARE_PROVIDER_SITE_OTHER): Payer: Medicare HMO | Admitting: Internal Medicine

## 2024-02-15 VITALS — BP 126/78 | HR 88 | Ht 62.0 in | Wt 177.2 lb

## 2024-02-15 DIAGNOSIS — E039 Hypothyroidism, unspecified: Secondary | ICD-10-CM | POA: Diagnosis not present

## 2024-02-15 DIAGNOSIS — E2749 Other adrenocortical insufficiency: Secondary | ICD-10-CM | POA: Diagnosis not present

## 2024-02-15 MED ORDER — HYDROCORTISONE 5 MG PO TABS: 5.0000 mg | ORAL_TABLET | ORAL | 3 refills | Status: DC

## 2024-02-15 MED ORDER — LEVOTHYROXINE SODIUM 137 MCG PO TABS: 137.0000 ug | ORAL_TABLET | Freq: Every day | ORAL | 3 refills | Status: DC

## 2024-02-15 NOTE — Patient Instructions (Signed)
 Obtain  a medical alert bracelet for Adrenal Insufficiency    ADRENAL INSUFFICIENCY SICK DAY RULES:  Should you face an extreme emotional or physical stress such as trauma, surgery or acute illness, this will require extra steroid coverage so that the body can meet that stress.   Without increasing the steroid dose you may experience severe weakness, headache, dizziness, nausea and vomiting and possibly a more serious deterioration in health.  Typically the dose of steroids will only need to be increased for a couple of days if you have an illness that is transient and managed in the community.   If you are unable to take/absorb an increased dose of steroids orally because of vomiting or diarrhea, you will urgently require steroid injections and should present to an Emergency Department.  The general advice for any serious illness is as follows: Double the normal daily steroid dose for up to 3 days if you have a temperature of more than 37.50C (99.47F) with signs of sickness, or severe emotional or physical distress Contact your primary care doctor and Endocrinologist if the illness worsens or it lasts for more than 3 days.  In cases of severe illness, urgent medical assistance should be promptly sought. If you experience vomiting/diarrhea or are unable to take steroids by mouth, please administer the Hydrocortisone injection kit and seek urgent medical help.

## 2024-02-15 NOTE — Progress Notes (Addendum)
 Name: [name]  MRN/ DOB: [id number], 04-30-1941    Age/ Sex: 83 y.o., female     PCP: Thana Ates, MD   Reason for Endocrinology Evaluation: Adrenal insufficiency      Initial Endocrinology Clinic Visit: 07/10/2021    PATIENT IDENTIFIER: [name] is a 83 y.o., female with a past medical history of CAD, CHF, IBS, hypothyroidism and Hx of breast ca ( S/P lumpectomy and radiation 2010) . She has followed with [facility] since 07/10/2021 for consultative assistance with management of her adrenal insufficiency.   HISTORICAL SUMMARY:  She was diagnosed with Secondary adrenal insufficiency based on abnormal Cosyntropin Stim Test 60 minute cortisol of 11.8 ug/dL in 03/980. ACTH at the lower end of normal 7 pg/mL.   Of note, the pt  has been on long term left knee intra-articular injections, declines surgery She is on Hydrocodone for pain   We started [facility] due to chronic use of intra-articular injection    THYROID HISTORY: She has also been noted with low TSH with a nadir of 0.01 uIU/mL in 11/2020. Pt on levothyroxine       HYPERCALCEMIA HISTORY: She has been noted to have hypercalcemia on 04/19/2021 with a corrected serum calcium of 10.4 (reference 8.6-10.3). Repeat labs at our clinic 07/1999 showed normal calcium at 10.3 mg, normal ionized calcium 5.45 mg/dl , normal Vit D 19.14 ng/mL as well as normal 1,25 dihydroxy vitamin D 53 pg/mL but low PTH at 12 pg/mL and low PTHrP 10 pg/mL    DM HISTORY: She was diagnosed with DM June/2024 with an A1c of 6.5%.  She was maintained on a low-carb diet   SUBJECTIVE:    Today (02/15/2024):  [name] is here for adrenal insufficiency, hypothyroidism.    She continues to follow-up with oncology for Hx noninvasive breast cancer  She is accompanied by her friend [name] Denies  local neck swelling  Has occasional palpitation NO biotin  Denies constipation with diarrhea  No intra-articular injections      Hydrocortisone 5 mg, 2 tablets with breakfast and 1 tablet in the afternoon levothyroxine 137  MCG , 1 tablet daily    HISTORY:  Past Medical History:  Past Medical History:  Diagnosis Date   Adrenal insufficiency (HCC) 07/25/2021   Anxiety    Arrhythmia    right bundle branch block   Bipolar 1 disorder (HCC)    Cancer (HCC)    right breast cancer   Depression    Dyspnea    with exertion   GERD (gastroesophageal reflux disease)    Headache    Heart murmur    Hyperlipidemia    Hypertension    Hypothyroidism    Morbid obesity (HCC)    Osteoarthritis    Pneumonia    PONV (postoperative nausea and vomiting)    Schizo-affective schizophrenia (HCC)    Thyroid disease    hypothyroidism   Past Surgical History:  Past Surgical History:  Procedure Laterality Date   BREAST SURGERY     CHOLECYSTECTOMY     DILATION AND CURETTAGE OF UTERUS     ESOPHAGEAL MANOMETRY N/A 01/29/2018   Procedure: ESOPHAGEAL MANOMETRY (EM);  Surgeon: Graylin Shiver, MD;  Location: [facility];  Service: Endoscopy;  Laterality: N/A;   HAND SURGERY     Right-trigger finger   IR THORACENTESIS ASP PLEURAL SPACE W/IMG GUIDE  06/28/2018   IR THORACENTESIS ASP PLEURAL SPACE W/IMG GUIDE  07/19/2018   KNEE SURGERY  Left   TONSILLECTOMY     TOTAL KNEE ARTHROPLASTY Left 07/08/2022   Procedure: TOTAL KNEE ARTHROPLASTY;  Surgeon: Durene Romans, MD;  Location: [facility];  Service: Orthopedics;  Laterality: Left;   Social History:  reports that she quit smoking about 45 years ago. Her smoking use included cigarettes. She has never used smokeless tobacco. She reports that she does not drink alcohol and does not use drugs. Family History:  Family History  Problem Relation Age of Onset   Hypertension Mother    Lung cancer Brother    Schizophrenia Brother      HOME MEDICATIONS: Allergies as of 02/15/2024       Reactions   Lithium Nausea Only   Fluoxetine Other (See Comments)   Pt felt crazy    Macrodantin Other (See Comments)   Unknown reaction   Mirabegron Other (See Comments)   ineffective   Nitrofurantoin Other (See Comments)   Other reaction(s): Did not agree with me   Paxil [paroxetine] Other (See Comments)   Made pt feel crazy   Prednisone Other (See Comments)   Makes her feel very jittery   Prozac [fluoxetine Hcl] Other (See Comments)   Pt felt crazy   Sertraline Hcl Other (See Comments)   Unknown reaction   Solifenacin Other (See Comments)   Ineffective    Sulfa Antibiotics Other (See Comments)   Unknown reaction   Wellbutrin [bupropion Hcl] Other (See Comments)   Unknown reaction        Medication List        Accurate as of February 15, 2024  9:40 AM. If you have any questions, ask your nurse or doctor.          Aleve 220 MG Caps Generic drug: Naproxen Sodium Take 1 capsule by mouth 2 (two) times daily.   amLODipine 2.5 MG tablet Commonly known as: NORVASC Take 2.5 mg by mouth at bedtime.   Asenapine Maleate 10 MG Subl place TWO TABLETS UNDER THE TONGUE EVERY EVENING - no food OR drink FOR 10 MINUTES AFTER   atorvastatin 40 MG tablet Commonly known as: LIPITOR Take 1 tablet (40 mg total) by mouth at bedtime.   Biotin 10 MG Tabs Take 10,000 mcg by mouth every morning.   CALCIUM CARBONATE PO Take 600 mg by mouth every morning.   clonazePAM 0.5 MG tablet Commonly known as: KLONOPIN Take 0.5 mg by mouth at bedtime.   co-enzyme Q-10 30 MG capsule Take 30 mg by mouth daily.   docusate sodium 100 MG capsule Commonly known as: COLACE Take 1 capsule (100 mg total) by mouth 2 (two) times daily. What changed:  when to take this reasons to take this   Fish Oil 1200 MG Caps Take 1,200 mg by mouth every morning.   furosemide 20 MG tablet Commonly known as: LASIX TAKE ONE TABLET BY MOUTH DAILY   gabapentin 300 MG capsule Commonly known as: NEURONTIN Take 1 capsule (300 mg total) by mouth at bedtime.   hydrocortisone 5 MG  tablet Commonly known as: CORTEF Take 1 tablet (5 mg total) by mouth as directed. 2 tabs with breakfast, and 1 tablet in the afternoon   lamoTRIgine 200 MG tablet Commonly known as: LAMICTAL Take 200 mg by mouth at bedtime.   levothyroxine 137 MCG tablet Commonly known as: SYNTHROID Take 1 tablet (137 mcg total) by mouth daily before breakfast.   memantine 10 MG tablet Commonly known as: NAMENDA Take 10 mg by mouth 2 (two) times daily.  metoprolol tartrate 25 MG tablet Commonly known as: LOPRESSOR Take 25 mg by mouth 2 (two) times daily.   multivitamin with minerals Tabs tablet Take 1 tablet by mouth every morning.   OLANZapine 15 MG tablet Commonly known as: ZYPREXA Take 30 mg by mouth at bedtime.   omeprazole 40 MG capsule Commonly known as: PRILOSEC Take 40 mg by mouth in the morning.   polyethylene glycol 17 g packet Commonly known as: MIRALAX / GLYCOLAX Take 17 g by mouth daily as needed for mild constipation.   polyvinyl alcohol 1.4 % ophthalmic solution Commonly known as: LIQUIFILM TEARS Place 1 drop into both eyes as needed for dry eyes.   promethazine 12.5 MG tablet Commonly known as: PHENERGAN Take 1 tablet (12.5 mg total) by mouth every 6 (six) hours as needed for nausea or vomiting.   ramelteon 8 MG tablet Commonly known as: ROZEREM Take 8 mg by mouth at bedtime.   spironolactone 25 MG tablet Commonly known as: ALDACTONE TAKE 1 TABLET (25 MG TOTAL) BY MOUTH DAILY.   traZODone 50 MG tablet Commonly known as: DESYREL Take 150 mg by mouth at bedtime.   VITAMIN B-12 PO Take 2,500 mcg by mouth every morning.   VITAMIN C PO Take 500 mg by mouth every morning.   Vitamin D 50 MCG (2000 UT) tablet Take 2,000 Units by mouth daily.          OBJECTIVE:   PHYSICAL EXAM: VS: BP 126/78 (BP Location: Left Arm, Patient Position: Sitting, Cuff Size: Small)   Pulse 88   Ht 5\' 2"  (1.575 m)   Wt 177 lb 3.2 oz (80.4 kg)   SpO2 96%   BMI 32.41 kg/m     EXAM: General: Pt appears well and is in NAD  Lungs: Clear with good BS bilat   Heart: Auscultation: RRR.  Extremities:  BL LE: stasis dermatitis noted  Mental Status: Judgment, insight: Intact Orientation: Oriented to time, place, and person Mood and affect: No depression, anxiety, or agitation     DATA REVIEWED:11/25/2023 A1c 6.7% GFR 74 TSH 0.850    Latest Reference Range & Units 02/09/23 09:33  Sodium 135 - 145 mEq/L 141  Potassium 3.5 - 5.1 mEq/L 4.8  Chloride 96 - 112 mEq/L 100  CO2 19 - 32 mEq/L 30  Glucose 70 - 99 mg/dL 865 (H)  BUN 6 - 23 mg/dL 27 (H)  Creatinine 7.84 - 1.20 mg/dL 6.96  Calcium 8.4 - 29.5 mg/dL 28.4  Albumin 3.5 - 5.2 g/dL 4.1  GFR >13.24 mL/min 63.62      ASSESSMENT / PLAN / RECOMMENDATIONS:    Secondary Adrenal Insufficiency:   -Patient had an abnormal cosyntropin stimulation test in 04/2021 -I suspect this is secondary adrenal insufficiency due to Hx of  intra-articular injections as well as chronic opiate use -We had discussed sick day rules -Pt advised to obtain a medical alert bracelet   Medication Continue hydrocortisone 5 mg, 2 tablets with breakfast and 1 tablet in the afternoon   2. Hypothyroidism   - Pt is clinically and biochemically euthyroid  - Recent TSH at PCP's office was normal, no change   Medication Continue  levothyroxine 137 mcg daily      F/U in 6 months     Signed electronically by: Lyndle Herrlich, MD  [facility]  [facility] [address] [place] [place], [place] [address] Phone: [phone] FAX: [phone]      CC: Thana Ates, MD [phone]  [address] [place] [address] Phone: [phone]  Fax: [phone]   Return to Endocrinology clinic as below: Future Appointments  Date Time Provider Department Center  09/13/2024  9:30 AM Drema Dallas, DO [id number] None

## 2024-02-22 DIAGNOSIS — M545 Low back pain, unspecified: Secondary | ICD-10-CM | POA: Diagnosis not present

## 2024-02-22 DIAGNOSIS — M7918 Myalgia, other site: Secondary | ICD-10-CM | POA: Diagnosis not present

## 2024-02-23 DIAGNOSIS — H353132 Nonexudative age-related macular degeneration, bilateral, intermediate dry stage: Secondary | ICD-10-CM | POA: Diagnosis not present

## 2024-02-23 DIAGNOSIS — H40013 Open angle with borderline findings, low risk, bilateral: Secondary | ICD-10-CM | POA: Diagnosis not present

## 2024-02-23 DIAGNOSIS — H35373 Puckering of macula, bilateral: Secondary | ICD-10-CM | POA: Diagnosis not present

## 2024-02-23 DIAGNOSIS — H4323 Crystalline deposits in vitreous body, bilateral: Secondary | ICD-10-CM | POA: Diagnosis not present

## 2024-03-07 DIAGNOSIS — R339 Retention of urine, unspecified: Secondary | ICD-10-CM | POA: Diagnosis not present

## 2024-03-07 DIAGNOSIS — L723 Sebaceous cyst: Secondary | ICD-10-CM | POA: Diagnosis not present

## 2024-03-17 ENCOUNTER — Ambulatory Visit (INDEPENDENT_AMBULATORY_CARE_PROVIDER_SITE_OTHER): Admitting: Podiatry

## 2024-03-17 ENCOUNTER — Encounter: Payer: Self-pay | Admitting: Podiatry

## 2024-03-17 DIAGNOSIS — B351 Tinea unguium: Secondary | ICD-10-CM | POA: Diagnosis not present

## 2024-03-17 DIAGNOSIS — M79674 Pain in right toe(s): Secondary | ICD-10-CM | POA: Diagnosis not present

## 2024-03-17 DIAGNOSIS — M79675 Pain in left toe(s): Secondary | ICD-10-CM

## 2024-03-17 DIAGNOSIS — M7751 Other enthesopathy of right foot: Secondary | ICD-10-CM | POA: Diagnosis not present

## 2024-03-17 MED ORDER — TRIAMCINOLONE ACETONIDE 10 MG/ML IJ SUSP
10.0000 mg | Freq: Once | INTRAMUSCULAR | Status: AC
  Administered 2024-03-17: 10 mg via INTRA_ARTICULAR

## 2024-03-17 NOTE — Progress Notes (Signed)
 Subjective:   Patient ID: [name], female   DOB: 83 y.o.   MRN: [id number]   HPI Patient presents with caregiver with nail disease 1-5 both feet that are thick and dystrophic and also is noted to have discomfort which is just reoccurred in the first MPJ right with history of structural deformity   ROS      Objective:  Physical Exam  Neurovascular status intact inflammation pain right first MPJ fluid buildup around the joint along with thick yellow brittle nailbeds 1-5 both feet moderately painful and complete inability for her to take care of inflammatory     Assessment:  Colitis with hallux limitus deformity right along with mycotic nail infection with pain 1-5 both feet     Plan:  H&P reviewed sterile prep injected the joint of her first MPJ right 3 mg Dexasone Kenalog 5 mg Xylocaine to reduce inflammation and debrided nailbeds 1-5 both feet iatrogenic bleeding reappoint routine care and possible future injections

## 2024-03-29 DIAGNOSIS — H40013 Open angle with borderline findings, low risk, bilateral: Secondary | ICD-10-CM | POA: Diagnosis not present

## 2024-04-21 DIAGNOSIS — Z853 Personal history of malignant neoplasm of breast: Secondary | ICD-10-CM | POA: Diagnosis not present

## 2024-04-21 DIAGNOSIS — Z08 Encounter for follow-up examination after completed treatment for malignant neoplasm: Secondary | ICD-10-CM | POA: Diagnosis not present

## 2024-04-25 DIAGNOSIS — M791 Myalgia, unspecified site: Secondary | ICD-10-CM | POA: Diagnosis not present

## 2024-04-25 DIAGNOSIS — M545 Low back pain, unspecified: Secondary | ICD-10-CM | POA: Diagnosis not present

## 2024-04-26 DIAGNOSIS — H40013 Open angle with borderline findings, low risk, bilateral: Secondary | ICD-10-CM | POA: Diagnosis not present

## 2024-05-10 DIAGNOSIS — L820 Inflamed seborrheic keratosis: Secondary | ICD-10-CM | POA: Diagnosis not present

## 2024-05-10 DIAGNOSIS — L309 Dermatitis, unspecified: Secondary | ICD-10-CM | POA: Diagnosis not present

## 2024-05-10 DIAGNOSIS — Z85828 Personal history of other malignant neoplasm of skin: Secondary | ICD-10-CM | POA: Diagnosis not present

## 2024-06-08 DIAGNOSIS — E782 Mixed hyperlipidemia: Secondary | ICD-10-CM | POA: Diagnosis not present

## 2024-06-08 DIAGNOSIS — E559 Vitamin D deficiency, unspecified: Secondary | ICD-10-CM | POA: Diagnosis not present

## 2024-06-08 DIAGNOSIS — E039 Hypothyroidism, unspecified: Secondary | ICD-10-CM | POA: Diagnosis not present

## 2024-06-08 DIAGNOSIS — I10 Essential (primary) hypertension: Secondary | ICD-10-CM | POA: Diagnosis not present

## 2024-06-08 DIAGNOSIS — Z79899 Other long term (current) drug therapy: Secondary | ICD-10-CM | POA: Diagnosis not present

## 2024-06-08 DIAGNOSIS — Z0000 Encounter for general adult medical examination without abnormal findings: Secondary | ICD-10-CM | POA: Diagnosis not present

## 2024-06-08 DIAGNOSIS — K219 Gastro-esophageal reflux disease without esophagitis: Secondary | ICD-10-CM | POA: Diagnosis not present

## 2024-06-08 DIAGNOSIS — R0602 Shortness of breath: Secondary | ICD-10-CM | POA: Diagnosis not present

## 2024-06-08 DIAGNOSIS — E2740 Unspecified adrenocortical insufficiency: Secondary | ICD-10-CM | POA: Diagnosis not present

## 2024-06-08 DIAGNOSIS — F259 Schizoaffective disorder, unspecified: Secondary | ICD-10-CM | POA: Diagnosis not present

## 2024-06-08 DIAGNOSIS — R7303 Prediabetes: Secondary | ICD-10-CM | POA: Diagnosis not present

## 2024-06-08 DIAGNOSIS — Z1331 Encounter for screening for depression: Secondary | ICD-10-CM | POA: Diagnosis not present

## 2024-06-08 DIAGNOSIS — I5032 Chronic diastolic (congestive) heart failure: Secondary | ICD-10-CM | POA: Diagnosis not present

## 2024-06-09 DIAGNOSIS — N1831 Chronic kidney disease, stage 3a: Secondary | ICD-10-CM | POA: Diagnosis not present

## 2024-07-04 DIAGNOSIS — M8588 Other specified disorders of bone density and structure, other site: Secondary | ICD-10-CM | POA: Diagnosis not present

## 2024-07-08 DIAGNOSIS — M791 Myalgia, unspecified site: Secondary | ICD-10-CM | POA: Diagnosis not present

## 2024-07-08 DIAGNOSIS — M545 Low back pain, unspecified: Secondary | ICD-10-CM | POA: Diagnosis not present

## 2024-07-14 ENCOUNTER — Ambulatory Visit

## 2024-07-14 ENCOUNTER — Encounter: Payer: Self-pay | Admitting: Podiatry

## 2024-07-14 ENCOUNTER — Ambulatory Visit: Admitting: Podiatry

## 2024-07-14 DIAGNOSIS — M7751 Other enthesopathy of right foot: Secondary | ICD-10-CM | POA: Diagnosis not present

## 2024-07-14 MED ORDER — TRIAMCINOLONE ACETONIDE 10 MG/ML IJ SUSP
10.0000 mg | Freq: Once | INTRAMUSCULAR | Status: AC
  Administered 2024-07-14: 10 mg via INTRA_ARTICULAR

## 2024-07-14 NOTE — Progress Notes (Signed)
 Subjective:   Patient ID: [name], female   DOB: 83 y.o.   MRN: [id number]   HPI Patient presents stating she has had an increase in pain around her big toe joint and has been bothering her for the last 4 to 6 weeks.  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

## 2024-07-18 DIAGNOSIS — H40053 Ocular hypertension, bilateral: Secondary | ICD-10-CM | POA: Diagnosis not present

## 2024-07-18 DIAGNOSIS — H40013 Open angle with borderline findings, low risk, bilateral: Secondary | ICD-10-CM | POA: Diagnosis not present

## 2024-08-09 ENCOUNTER — Other Ambulatory Visit (HOSPITAL_BASED_OUTPATIENT_CLINIC_OR_DEPARTMENT_OTHER): Payer: Self-pay | Admitting: Cardiovascular Disease

## 2024-08-09 DIAGNOSIS — I5032 Chronic diastolic (congestive) heart failure: Secondary | ICD-10-CM

## 2024-08-09 DIAGNOSIS — R0609 Other forms of dyspnea: Secondary | ICD-10-CM

## 2024-08-18 ENCOUNTER — Ambulatory Visit (INDEPENDENT_AMBULATORY_CARE_PROVIDER_SITE_OTHER): Admitting: Internal Medicine

## 2024-08-18 ENCOUNTER — Encounter: Payer: Self-pay | Admitting: Internal Medicine

## 2024-08-18 VITALS — BP 136/82 | HR 74 | Ht 62.0 in | Wt 164.0 lb

## 2024-08-18 DIAGNOSIS — E039 Hypothyroidism, unspecified: Secondary | ICD-10-CM | POA: Diagnosis not present

## 2024-08-18 DIAGNOSIS — E2749 Other adrenocortical insufficiency: Secondary | ICD-10-CM | POA: Diagnosis not present

## 2024-08-18 LAB — TSH: TSH: 0.03 m[IU]/L — ABNORMAL LOW (ref 0.40–4.50)

## 2024-08-18 LAB — T4, FREE: Free T4: 2 ng/dL — ABNORMAL HIGH (ref 0.8–1.8)

## 2024-08-18 MED ORDER — HYDROCORTISONE 5 MG PO TABS: 5.0000 mg | ORAL_TABLET | ORAL | 3 refills | Status: AC

## 2024-08-18 NOTE — Progress Notes (Unsigned)
 Name: [name]  MRN/ DOB: [id number], 22-May-1941    Age/ Sex: 83 y.o., female     PCP: Amy Trula SQUIBB, MD   Reason for Endocrinology Evaluation: Adrenal insufficiency      Initial Endocrinology Clinic Visit: 07/10/2021    PATIENT IDENTIFIER: [name] is a 83 y.o., female with a past medical history of CAD, CHF, IBS, hypothyroidism and Hx of breast ca ( S/P lumpectomy and radiation 2010) . She has followed with [facility] since 07/10/2021 for consultative assistance with management of her adrenal insufficiency.   HISTORICAL SUMMARY:  She was diagnosed with Secondary adrenal insufficiency based on abnormal Cosyntropin  Stim Test 60 minute cortisol of 11.8 ug/dL in 04/7976. ACTH  at the lower end of normal 7 pg/mL.   Of note, the pt  has been on long term left knee intra-articular injections, declines surgery She is on Hydrocodone  for pain   We started [facility] due to chronic use of intra-articular injection    THYROID  HISTORY: She has also been noted with low TSH with a nadir of 0.01 uIU/mL in 11/2020. Pt on levothyroxine        HYPERCALCEMIA HISTORY: She has been noted to have hypercalcemia on 04/19/2021 with a corrected serum calcium  of 10.4 (reference 8.6-10.3). Repeat labs at our clinic 07/1999 showed normal calcium  at 10.3 mg, normal ionized calcium  5.45 mg/dl , normal Vit D 47.63 ng/mL as well as normal 1,25 dihydroxy vitamin D  53 pg/mL but low PTH at 12 pg/mL and low PTHrP 10 pg/mL    DM HISTORY: She was diagnosed with DM June/2024 with an A1c of 6.5%.  She was maintained on a low-carb diet   SUBJECTIVE:    Today (08/18/2024):  [name] is here for adrenal insufficiency, hypothyroidism.    She continues to follow-up with oncology for Hx noninvasive breast cancer   She has been following up with podiatry for capsulitis of metatarsal phalangeal joint of the right foot, she received Kenalog  07/14/2024 She continues to receive lumbar intra-articular  injections every 3 months    She is accompanied by her friend [name] No local neck swelling  Has occasional palpitations Has alternating bowel movements  Denies  local neck swelling  Has occasional palpitation Has a medical alert bracelet  Energy stable  No headaches      Hydrocortisone  5 mg, 2 tablets with breakfast and 1 tablet in the afternoon levothyroxine  137  MCG , 1 tablet daily    HISTORY:  Past Medical History:  Past Medical History:  Diagnosis Date   Adrenal insufficiency (HCC) 07/25/2021   Anxiety    Arrhythmia    right bundle branch block   Bipolar 1 disorder (HCC)    Cancer (HCC)    right breast cancer   Depression    Dyspnea    with exertion   GERD (gastroesophageal reflux disease)    Headache    Heart murmur    Hyperlipidemia    Hypertension    Hypothyroidism    Morbid obesity (HCC)    Osteoarthritis    Pneumonia    PONV (postoperative nausea and vomiting)    Schizo-affective schizophrenia (HCC)    Thyroid  disease    hypothyroidism   Past Surgical History:  Past Surgical History:  Procedure Laterality Date   BREAST SURGERY     CHOLECYSTECTOMY     DILATION AND CURETTAGE OF UTERUS     ESOPHAGEAL MANOMETRY N/A 01/29/2018   Procedure: ESOPHAGEAL MANOMETRY (EM);  Surgeon: Amy Lesta FALCON,  MD;  Location: [facility];  Service: Endoscopy;  Laterality: N/A;   HAND SURGERY     Right-trigger finger   IR THORACENTESIS ASP PLEURAL SPACE W/IMG GUIDE  06/28/2018   IR THORACENTESIS ASP PLEURAL SPACE W/IMG GUIDE  07/19/2018   KNEE SURGERY     Left   TONSILLECTOMY     TOTAL KNEE ARTHROPLASTY Left 07/08/2022   Procedure: TOTAL KNEE ARTHROPLASTY;  Surgeon: Amy Cough, MD;  Location: [facility];  Service: Orthopedics;  Laterality: Left;   Social History:  reports that she quit smoking about 45 years ago. Her smoking use included cigarettes. She has never used smokeless tobacco. She reports that she does not drink alcohol and does not use drugs. Family History:   Family History  Problem Relation Age of Onset   Hypertension Mother    Lung cancer Brother    Schizophrenia Brother      HOME MEDICATIONS: Allergies as of 08/18/2024       Reactions   Lithium Nausea Only   Fluoxetine Other (See Comments)   Pt felt crazy   Macrodantin Other (See Comments)   Unknown reaction   Mirabegron Other (See Comments)   ineffective   Nitrofurantoin Other (See Comments)   Other reaction(s): Did not agree with me   Paxil [paroxetine] Other (See Comments)   Made pt feel crazy   Prednisone Other (See Comments)   Makes her feel very jittery   Prozac [fluoxetine Hcl] Other (See Comments)   Pt felt crazy   Sertraline Hcl Other (See Comments)   Unknown reaction   Solifenacin Other (See Comments)   Ineffective    Sulfa Antibiotics Other (See Comments)   Unknown reaction   Wellbutrin [bupropion Hcl] Other (See Comments)   Unknown reaction        Medication List        Accurate as of August 18, 2024  9:19 AM. If you have any questions, ask your nurse or doctor.          Aleve 220 MG Caps Generic drug: Naproxen Sodium Take 1 capsule by mouth 2 (two) times daily.   amLODipine  2.5 MG tablet Commonly known as: NORVASC  Take 2.5 mg by mouth at bedtime.   artificial tears ophthalmic solution Place 1 drop into both eyes as needed for dry eyes.   Asenapine Maleate 10 MG Subl place TWO TABLETS UNDER THE TONGUE EVERY EVENING - no food OR drink FOR 10 MINUTES AFTER   atorvastatin  40 MG tablet Commonly known as: LIPITOR Take 1 tablet (40 mg total) by mouth at bedtime.   Biotin 10 MG Tabs Take 10,000 mcg by mouth every morning.   CALCIUM  CARBONATE PO Take 600 mg by mouth every morning.   clonazePAM 0.5 MG tablet Commonly known as: KLONOPIN Take 0.5 mg by mouth at bedtime.   co-enzyme Q-10 30 MG capsule Take 30 mg by mouth daily.   docusate sodium  100 MG capsule Commonly known as: COLACE Take 1 capsule (100 mg total) by mouth 2 (two)  times daily. What changed:  when to take this reasons to take this   Fish Oil 1200 MG Caps Take 1,200 mg by mouth every morning.   furosemide  20 MG tablet Commonly known as: LASIX  TAKE ONE TABLET BY MOUTH DAILY   gabapentin  300 MG capsule Commonly known as: NEURONTIN  Take 1 capsule (300 mg total) by mouth at bedtime.   hydrocortisone  5 MG tablet Commonly known as: CORTEF  Take 1 tablet (5 mg total) by mouth as directed.  2 tabs with breakfast, and 1 tablet in the afternoon   lamoTRIgine  200 MG tablet Commonly known as: LAMICTAL  Take 200 mg by mouth at bedtime.   levothyroxine  137 MCG tablet Commonly known as: SYNTHROID  Take 1 tablet (137 mcg total) by mouth daily before breakfast.   memantine  10 MG tablet Commonly known as: NAMENDA  Take 10 mg by mouth 2 (two) times daily.   metoprolol  tartrate 25 MG tablet Commonly known as: LOPRESSOR  Take 25 mg by mouth 2 (two) times daily.   multivitamin with minerals Tabs tablet Take 1 tablet by mouth every morning.   OLANZapine  15 MG tablet Commonly known as: ZYPREXA  Take 30 mg by mouth at bedtime.   omeprazole  40 MG capsule Commonly known as: PRILOSEC Take 40 mg by mouth in the morning.   polyethylene glycol 17 g packet Commonly known as: MIRALAX  / GLYCOLAX  Take 17 g by mouth daily as needed for mild constipation.   promethazine  12.5 MG tablet Commonly known as: PHENERGAN  Take 1 tablet (12.5 mg total) by mouth every 6 (six) hours as needed for nausea or vomiting.   ramelteon  8 MG tablet Commonly known as: ROZEREM  Take 8 mg by mouth at bedtime.   spironolactone  25 MG tablet Commonly known as: ALDACTONE  TAKE 1 TABLET (25 MG TOTAL) BY MOUTH DAILY.   traZODone  50 MG tablet Commonly known as: DESYREL  Take 150 mg by mouth at bedtime.   VITAMIN B-12 PO Take 2,500 mcg by mouth every morning.   VITAMIN C PO Take 500 mg by mouth every morning.   Vitamin D  50 MCG (2000 UT) tablet Take 2,000 Units by mouth daily.           OBJECTIVE:   PHYSICAL EXAM: VS: BP 136/82 (BP Location: Left Arm, Patient Position: Sitting, Cuff Size: Normal)   Pulse 74   Ht 5' 2 (1.575 m)   Wt 164 lb (74.4 kg)   SpO2 97%   BMI 30.00 kg/m     Body surface area is 1.8 meters squared.  EXAM: General: Pt appears well and is in NAD  Lungs: Clear with good BS bilat   Heart: Auscultation: RRR.  Extremities:  BL LE: stasis dermatitis noted  Mental Status: Judgment, insight: Intact Orientation: Oriented to time, place, and person Mood and affect: No depression, anxiety, or agitation     DATA REVIEWED: Labs through [facility] 06/08/2024  A1c 6.3% BUN 18 GFR 75 LDL 59 Triglycerides 156    ASSESSMENT / PLAN / RECOMMENDATIONS:    Secondary Adrenal Insufficiency:   -Patient had an abnormal cosyntropin  stimulation test in 04/2021 - This is secondary adrenal insufficiency due to Hx of  intra-articular injections as well as chronic opiate use -ACTH  continues to be undetectable as of December, 2024 -We had discussed sick day rules - She can't tolerate prednisone , will continue current dose of hydrocortisone  - I did explain to the patient that she will be on hydrocortisone  long-term as she continues to receive intra-articular injections every 3 months, mainly in the back.  Medication Continue hydrocortisone  5 mg, 2 tablets with breakfast and 1 tablet in the afternoon   2. Hypothyroidism   - Pt is clinically euthyroid  - TFTs today***    Medication Continue  levothyroxine  137 mcg daily    3) T2DM:   - A1c 6.3% at PCPs office - Will defer to PCP  F/U in 1 yr     Signed electronically by: Stefano Redgie Butts, MD  [facility]  [facility]  [address] [place], [place] [address] Phone: [phone] FAX: [phone]      CC: Amy Trula SQUIBB, MD [address] [place] [address] Phone: [phone]  Fax: [phone]   Return to  Endocrinology clinic as below: Future Appointments  Date Time Provider Department Center  08/18/2024  9:30 AM Krislynn Gronau, Donell Cardinal, MD [facility] None  09/13/2024  9:30 AM Skeet Juliene SAUNDERS, DO [facility] None

## 2024-08-18 NOTE — Patient Instructions (Signed)
ADRENAL INSUFFICIENCY SICK DAY RULES: ? ?Should you face an extreme emotional or physical stress such as trauma, surgery or acute illness, this will require extra steroid coverage so that the body can meet that stress.  ? ?Without increasing the steroid dose you may experience severe weakness, headache, dizziness, nausea and vomiting and possibly a more serious deterioration in health.  ?Typically the dose of steroids will only need to be increased for a couple of days if you have an illness that is transient and managed in the community.  ? ?If you are unable to take/absorb an increased dose of steroids orally because of vomiting or diarrhea, you will urgently require steroid injections and should present to an Emergency Department. ? ?The general advice for any serious illness is as follows: ?Double the normal daily steroid dose for up to 3 days if you have a temperature of more than 37.50C (99.50F) with signs of sickness, or severe emotional or physical distress ?Contact your primary care doctor and Endocrinologist if the illness worsens or it lasts for more than 3 days.  ?In cases of severe illness, urgent medical assistance should be promptly sought. ?If you experience vomiting/diarrhea or are unable to take steroids by mouth, please administer the Hydrocortisone injection kit and seek urgent medical help. ? ? ? ?

## 2024-08-19 ENCOUNTER — Ambulatory Visit: Payer: Self-pay | Admitting: Internal Medicine

## 2024-08-19 MED ORDER — LEVOTHYROXINE SODIUM 125 MCG PO TABS: 125.0000 ug | ORAL_TABLET | Freq: Every day | ORAL | 6 refills | Status: DC

## 2024-08-19 NOTE — Telephone Encounter (Signed)
 Please let the patient know that her thyroid  test shows that she is on TOO much levothyroxine .   Will need to stop levothyroxine  137 and start levothyroxine  125 mcg daily   Please schedule the patient for repeat labs in 2 months    Thanks

## 2024-09-09 NOTE — Progress Notes (Signed)
 NEUROLOGY FOLLOW UP OFFICE NOTE  [name] [id number]  Assessment/Plan:   1.  Cervicogenic headache  2.  Cervical spondylosis 3.  Hypertension   1.  gabapentin  300mg  at bedtime 2.  May use Tylenol  or ibuprofen for rescue treatment, just limit use of pain relievers to no more than 2 days out of week to prevent risk of rebound or medication-overuse headache. 3.  Follow up with PCP regarding BP 4.  Follow up one year  Subjective:  [name] is an 13 year right-handed old female with HTN, HLD, hypothyroidism, Bipolar 1 disorder, schizoaffective disorder, Alzheimer's disease and history of breast cancer who follows up for headache.  She is accompanied by her friend who supplements hsitory.   UPDATE: She lives at [facility].   Headaches remain stable.  No falls.  Sometimes feels lightheaded when going to exercise classes.     Current NSAIDS:  Ibuprofen 400mg  twice daily with food Current analgesics:  Tylenol  Current triptans:  none Current ergotamine:  none Current anti-emetic:  Zofran  4mg  Current muscle relaxants:  Robaxin  Current anti-anxiolytic:  alprazolam  Current sleep aide:  ramelteon  8mg  Current Antihypertensive medications:  Metoprolol  tartrate 25mg  daily, spironolactone , furosemide  Current Antidepressant/antipsychotic medications:  trazodone  100mg  at bedtime, Olanzapine  15mg  twice daily Current Anticonvulsant medications:   Gabapentin  300 mg at bedtime; lamotrigine  150mg  daily Current anti-CGRP:  none Current Vitamins/Herbal/Supplements:  D, Fish  Oil, MVI Current Antihistamines/Decongestants:  none Other therapy:  none Hormone/birth control:  none Other medications:  Synthroid , memantine .  She stopped donepezil  and nausea resolved.     HISTORY:  Onset:  Around April 2020.  No preceding event. Location:  Back of head, usually occurs at night, not during the day.  Non-radiating. Quality:  Non-throbbing Initial intensity:  Mild to moderate.  She denies  thunderclap headache Aura:  none Premonitory Phase:  none Postdrome:  none Associated symptoms:  She denies associated scalp paresthesias/allodynia, nausea, vomiting, photophobia, phonophobia, visual disturbance or unilateral numbness or weakness. Initial duration:  Feels it as she goes to bed and falls asleep. If she wakes up throughout the night, she may feel it.  Once she gets up in the morning, she feels okay.  No headaches during the day.   Initial Frequency:  Almost every night Triggers:  Laying down Relieving factors:  Getting up. Activity:  Does not aggravate She followed up with her orthopedist, Dr. Heide, who ordered a cervical X-ray and was told she had multilevel arthritis.  She was prescribed ibuprofen.       Past imaging (personally reviewed): 04/27/2013 CT Head:  Atrophy and mild chronic small vessel ischemic changes in white matter. No acute intracranial abnormality. 04/27/2013 CT Cervical Spine:  Moderate to advanced cervical degenerative disease with spondylosis and facet hypertrophy as well as multilevel spinal stenosis most prominent at C5-6 and C6-7, as well as foraminal stenosis bilaterally most prominent at C3-4 and C4-5 and on right at C5-6. 07/05/2009 MRI Brain w/o:  Chronic small vessel ischemic changes in hemispheric white matter and frontal and temporal atrophy.  No acute intracranial abnormality. 10/11/2008 MRI Brain w/o:  Frontal and temporal atrophy and chronic small vessel ischemic changes in hemispheric white matter.  No acute intracranial abnormality.   Past NSAIDS:  Ibuprofen 800mg  twice daily (made her feel unsteady with slurred speech), naproxen 220mg  Past analgesics: none Past abortive triptans:  none Past abortive ergotamine:  none Past muscle relaxants:  Tizanidine 4mg  at bedtime (ineffective) Past anti-emetic:  none Past antihypertensive medications:  none Past antidepressant  medications:  none Past anticonvulsant medications:  none Past anti-CGRP:   none Past vitamins/Herbal/Supplements:  none Past antihistamines/decongestants:  none Other past therapies:  none  PAST MEDICAL HISTORY: Past Medical History:  Diagnosis Date   Adrenal insufficiency 07/25/2021   Anxiety    Arrhythmia    right bundle branch block   Bipolar 1 disorder (HCC)    Cancer (HCC)    right breast cancer   Depression    Dyspnea    with exertion   GERD (gastroesophageal reflux disease)    Headache    Heart murmur    Hyperlipidemia    Hypertension    Hypothyroidism    Morbid obesity (HCC)    Osteoarthritis    Pneumonia    PONV (postoperative nausea and vomiting)    Schizo-affective schizophrenia (HCC)    Thyroid  disease    hypothyroidism    MEDICATIONS: Current Outpatient Medications on File Prior to Visit  Medication Sig Dispense Refill   amLODipine  (NORVASC ) 2.5 MG tablet Take 2.5 mg by mouth at bedtime.     Ascorbic Acid (VITAMIN C PO) Take 500 mg by mouth every morning.     Asenapine Maleate 10 MG SUBL place TWO TABLETS UNDER THE TONGUE EVERY EVENING - no food OR drink FOR 10 MINUTES AFTER     atorvastatin  (LIPITOR) 40 MG tablet Take 1 tablet (40 mg total) by mouth at bedtime. 90 tablet 3   Biotin 10 MG TABS Take 10,000 mcg by mouth every morning.     Calcium  Carbonate Antacid (CALCIUM  CARBONATE PO) Take 600 mg by mouth every morning.     Cholecalciferol  (VITAMIN D ) 50 MCG (2000 UT) tablet Take 2,000 Units by mouth daily.     clonazePAM (KLONOPIN) 0.5 MG tablet Take 0.5 mg by mouth at bedtime.     co-enzyme Q-10 30 MG capsule Take 30 mg by mouth daily.     Cyanocobalamin  (VITAMIN B-12 PO) Take 2,500 mcg by mouth every morning.     docusate sodium  (COLACE) 100 MG capsule Take 1 capsule (100 mg total) by mouth 2 (two) times daily. (Patient taking differently: Take 100 mg by mouth daily as needed.) 10 capsule 0   furosemide  (LASIX ) 20 MG tablet TAKE ONE TABLET BY MOUTH DAILY 90 tablet 1   gabapentin  (NEURONTIN ) 300 MG capsule Take 1 capsule (300  mg total) by mouth at bedtime. 90 capsule 5   hydrocortisone  (CORTEF ) 5 MG tablet Take 1 tablet (5 mg total) by mouth as directed. 2 tabs with breakfast, and 1 tablet in the afternoon 300 tablet 3   lamoTRIgine  (LAMICTAL ) 200 MG tablet Take 200 mg by mouth at bedtime.  12   levothyroxine  (SYNTHROID ) 125 MCG tablet Take 1 tablet (125 mcg total) by mouth daily. 30 tablet 6   memantine  (NAMENDA ) 10 MG tablet Take 10 mg by mouth 2 (two) times daily.     metoprolol  tartrate (LOPRESSOR ) 25 MG tablet Take 25 mg by mouth 2 (two) times daily.     Multiple Vitamin (MULTIVITAMIN WITH MINERALS) TABS tablet Take 1 tablet by mouth every morning.     Naproxen Sodium (ALEVE) 220 MG CAPS Take 1 capsule by mouth 2 (two) times daily.     OLANZapine  (ZYPREXA ) 15 MG tablet Take 30 mg by mouth at bedtime.     Omega-3 Fatty Acids (FISH OIL) 1200 MG CAPS Take 1,200 mg by mouth every morning.     omeprazole  (PRILOSEC) 40 MG capsule Take 40 mg by mouth in the morning.  polyethylene glycol (MIRALAX  / GLYCOLAX ) 17 g packet Take 17 g by mouth daily as needed for mild constipation. 14 each 0   polyvinyl alcohol (LIQUIFILM TEARS) 1.4 % ophthalmic solution Place 1 drop into both eyes as needed for dry eyes.     promethazine  (PHENERGAN ) 12.5 MG tablet Take 1 tablet (12.5 mg total) by mouth every 6 (six) hours as needed for nausea or vomiting. 15 tablet 0   ramelteon  (ROZEREM ) 8 MG tablet Take 8 mg by mouth at bedtime.     spironolactone  (ALDACTONE ) 25 MG tablet TAKE 1 TABLET (25 MG TOTAL) BY MOUTH DAILY. 90 tablet 0   traZODone  (DESYREL ) 50 MG tablet Take 150 mg by mouth at bedtime.     No current facility-administered medications on file prior to visit.    ALLERGIES: Allergies  Allergen Reactions   Lithium Nausea Only   Fluoxetine Other (See Comments)    Pt felt crazy   Macrodantin Other (See Comments)    Unknown reaction   Mirabegron Other (See Comments)    ineffective   Nitrofurantoin Other (See Comments)     Other reaction(s): Did not agree with me   Paxil [Paroxetine] Other (See Comments)    Made pt feel crazy    Prednisone Other (See Comments)    Makes her feel very jittery   Prozac [Fluoxetine Hcl] Other (See Comments)    Pt felt crazy    Sertraline Hcl Other (See Comments)    Unknown reaction   Solifenacin Other (See Comments)    Ineffective    Sulfa Antibiotics Other (See Comments)    Unknown reaction   Wellbutrin [Bupropion Hcl] Other (See Comments)    Unknown reaction    FAMILY HISTORY: Family History  Problem Relation Age of Onset   Hypertension Mother    Lung cancer Brother    Schizophrenia Brother       Objective:  Blood pressure (!) 152/55, pulse 97, height 5' 2 (1.575 m), weight 166 lb (75.3 kg). General: No acute distress.  Patient appears well-groomed.   Head:  Normocephalic/atraumatic Neck:  Supple.  No paraspinal tenderness.  Full range of motion. Heart:  Regular rate and rhythm. Neuro:  Alert and oriented.  Speech fluent and not dysarthric.  Language intact.  CN II-XII intact.  Bulk and tone normal.  Muscle strength 5/5 throughout.  Sensation to light touch intact.  Deep tendon reflexes 1+ upper extremities, absent lower extremities. Wide-based gait.  Ambulates with walker.    Juliene Dunnings, DO  CC: Trula Brim, MD

## 2024-09-12 ENCOUNTER — Ambulatory Visit (INDEPENDENT_AMBULATORY_CARE_PROVIDER_SITE_OTHER): Admitting: Neurology

## 2024-09-12 ENCOUNTER — Encounter: Payer: Self-pay | Admitting: Neurology

## 2024-09-12 VITALS — BP 152/55 | HR 97 | Ht 62.0 in | Wt 166.0 lb

## 2024-09-12 DIAGNOSIS — G4486 Cervicogenic headache: Secondary | ICD-10-CM

## 2024-09-12 MED ORDER — GABAPENTIN 300 MG PO CAPS: 300.0000 mg | ORAL_CAPSULE | Freq: Every day | ORAL | 3 refills | Status: AC

## 2024-09-13 ENCOUNTER — Ambulatory Visit: Payer: Medicare HMO | Admitting: Neurology

## 2024-09-29 DIAGNOSIS — H40053 Ocular hypertension, bilateral: Secondary | ICD-10-CM | POA: Diagnosis not present

## 2024-09-29 DIAGNOSIS — H40013 Open angle with borderline findings, low risk, bilateral: Secondary | ICD-10-CM | POA: Diagnosis not present

## 2024-10-20 ENCOUNTER — Other Ambulatory Visit

## 2024-10-21 ENCOUNTER — Telehealth: Payer: Self-pay | Admitting: Internal Medicine

## 2024-10-21 ENCOUNTER — Other Ambulatory Visit: Payer: Self-pay | Admitting: Internal Medicine

## 2024-10-21 LAB — T4, FREE: Free T4: 1.4 ng/dL (ref 0.8–1.8)

## 2024-10-21 LAB — TSH: TSH: 0.17 m[IU]/L — ABNORMAL LOW (ref 0.40–4.50)

## 2024-10-21 MED ORDER — LEVOTHYROXINE SODIUM 112 MCG PO TABS
112.0000 ug | ORAL_TABLET | Freq: Every day | ORAL | 6 refills | Status: AC
Start: 2024-10-21 — End: ?

## 2024-10-21 NOTE — Telephone Encounter (Signed)
 Informed patient of results and recommendations.

## 2024-10-21 NOTE — Telephone Encounter (Signed)
 Please let the patient know that her thyroid  level is slightly better but continues to show that she is on too much levothyroxine .    Please stop levothyroxine  125 mcg dose and start a new prescription of levothyroxine  112 mcg daily      Thanks

## 2024-10-21 NOTE — Addendum Note (Signed)
 Addended by: SAM DONELL PARAS on: 10/21/2024 01:54 PM   Modules accepted: Orders

## 2024-11-10 ENCOUNTER — Other Ambulatory Visit (HOSPITAL_BASED_OUTPATIENT_CLINIC_OR_DEPARTMENT_OTHER): Payer: Self-pay | Admitting: Cardiovascular Disease

## 2024-11-10 DIAGNOSIS — I5032 Chronic diastolic (congestive) heart failure: Secondary | ICD-10-CM

## 2024-11-10 DIAGNOSIS — R0609 Other forms of dyspnea: Secondary | ICD-10-CM

## 2024-11-11 ENCOUNTER — Other Ambulatory Visit: Payer: Self-pay | Admitting: Family

## 2024-11-11 DIAGNOSIS — I5032 Chronic diastolic (congestive) heart failure: Secondary | ICD-10-CM

## 2024-11-11 DIAGNOSIS — R0609 Other forms of dyspnea: Secondary | ICD-10-CM

## 2024-11-11 MED ORDER — FUROSEMIDE 20 MG PO TABS: 20.0000 mg | ORAL_TABLET | Freq: Every day | ORAL | 0 refills | Status: AC

## 2025-08-17 ENCOUNTER — Ambulatory Visit: Admitting: Internal Medicine

## 2025-09-14 ENCOUNTER — Ambulatory Visit: Admitting: Neurology
